# Patient Record
Sex: Female | Born: 1955 | Race: Black or African American | Hispanic: No | Smoking: Current every day smoker
Health system: Southern US, Community
[De-identification: ages and names within clinical notes are randomized; demographics above are authoritative.]

## PROBLEM LIST (undated history)

## (undated) DIAGNOSIS — G4700 Insomnia, unspecified: Secondary | ICD-10-CM

## (undated) DIAGNOSIS — I739 Peripheral vascular disease, unspecified: Secondary | ICD-10-CM

## (undated) DIAGNOSIS — D649 Anemia, unspecified: Secondary | ICD-10-CM

## (undated) DIAGNOSIS — B20 Human immunodeficiency virus [HIV] disease: Secondary | ICD-10-CM

## (undated) DIAGNOSIS — R350 Frequency of micturition: Secondary | ICD-10-CM

## (undated) DIAGNOSIS — G459 Transient cerebral ischemic attack, unspecified: Secondary | ICD-10-CM

## (undated) DIAGNOSIS — K219 Gastro-esophageal reflux disease without esophagitis: Secondary | ICD-10-CM

## (undated) DIAGNOSIS — R29898 Other symptoms and signs involving the musculoskeletal system: Secondary | ICD-10-CM

## (undated) DIAGNOSIS — B1920 Unspecified viral hepatitis C without hepatic coma: Secondary | ICD-10-CM

## (undated) DIAGNOSIS — E119 Type 2 diabetes mellitus without complications: Secondary | ICD-10-CM

## (undated) DIAGNOSIS — G629 Polyneuropathy, unspecified: Secondary | ICD-10-CM

## (undated) DIAGNOSIS — K861 Other chronic pancreatitis: Secondary | ICD-10-CM

## (undated) DIAGNOSIS — F1910 Other psychoactive substance abuse, uncomplicated: Secondary | ICD-10-CM

## (undated) DIAGNOSIS — R110 Nausea: Secondary | ICD-10-CM

## (undated) DIAGNOSIS — F39 Unspecified mood [affective] disorder: Secondary | ICD-10-CM

## (undated) DIAGNOSIS — I428 Other cardiomyopathies: Secondary | ICD-10-CM

## (undated) DIAGNOSIS — M549 Dorsalgia, unspecified: Secondary | ICD-10-CM

## (undated) DIAGNOSIS — E7800 Pure hypercholesterolemia, unspecified: Secondary | ICD-10-CM

## (undated) DIAGNOSIS — J45909 Unspecified asthma, uncomplicated: Secondary | ICD-10-CM

## (undated) DIAGNOSIS — I499 Cardiac arrhythmia, unspecified: Secondary | ICD-10-CM

## (undated) DIAGNOSIS — R351 Nocturia: Secondary | ICD-10-CM

## (undated) DIAGNOSIS — G8929 Other chronic pain: Secondary | ICD-10-CM

## (undated) DIAGNOSIS — J302 Other seasonal allergic rhinitis: Secondary | ICD-10-CM

## (undated) DIAGNOSIS — M1990 Unspecified osteoarthritis, unspecified site: Secondary | ICD-10-CM

## (undated) DIAGNOSIS — F32A Depression, unspecified: Secondary | ICD-10-CM

## (undated) DIAGNOSIS — K5900 Constipation, unspecified: Secondary | ICD-10-CM

## (undated) DIAGNOSIS — F329 Major depressive disorder, single episode, unspecified: Secondary | ICD-10-CM

## (undated) HISTORY — PX: LOWER EXTREMITY ANGIOGRAM: SHX5955

## (undated) HISTORY — PX: TUBAL LIGATION: SHX77

## (undated) HISTORY — DX: Major depressive disorder, single episode, unspecified: F32.9

## (undated) HISTORY — DX: Depression, unspecified: F32.A

## (undated) HISTORY — PX: CAROTID ENDARTERECTOMY: SUR193

## (undated) HISTORY — DX: Other chronic pain: G89.29

## (undated) HISTORY — DX: Other cardiomyopathies: I42.8

## (undated) HISTORY — DX: Polyneuropathy, unspecified: G62.9

## (undated) HISTORY — PX: TOE AMPUTATION: SHX809

---

## 1989-08-07 DIAGNOSIS — Z21 Asymptomatic human immunodeficiency virus [HIV] infection status: Secondary | ICD-10-CM

## 1989-08-07 DIAGNOSIS — B20 Human immunodeficiency virus [HIV] disease: Secondary | ICD-10-CM

## 1989-08-07 HISTORY — DX: Human immunodeficiency virus (HIV) disease: B20

## 1989-08-07 HISTORY — DX: Asymptomatic human immunodeficiency virus (hiv) infection status: Z21

## 2002-11-13 ENCOUNTER — Encounter: Payer: Self-pay | Admitting: Emergency Medicine

## 2002-11-13 ENCOUNTER — Emergency Department (HOSPITAL_COMMUNITY): Admission: EM | Admit: 2002-11-13 | Discharge: 2002-11-14 | Payer: Self-pay | Admitting: Emergency Medicine

## 2002-11-14 ENCOUNTER — Encounter: Payer: Self-pay | Admitting: Emergency Medicine

## 2004-10-01 ENCOUNTER — Ambulatory Visit: Payer: Self-pay | Admitting: Nurse Practitioner

## 2004-10-02 ENCOUNTER — Ambulatory Visit: Payer: Self-pay | Admitting: Nurse Practitioner

## 2004-10-09 ENCOUNTER — Ambulatory Visit: Payer: Self-pay | Admitting: Family Medicine

## 2004-10-17 ENCOUNTER — Ambulatory Visit: Payer: Self-pay | Admitting: Family Medicine

## 2005-07-07 ENCOUNTER — Ambulatory Visit: Payer: Self-pay | Admitting: Nurse Practitioner

## 2005-08-18 ENCOUNTER — Inpatient Hospital Stay (HOSPITAL_COMMUNITY): Admission: EM | Admit: 2005-08-18 | Discharge: 2005-08-26 | Payer: Self-pay | Admitting: Emergency Medicine

## 2005-08-27 ENCOUNTER — Ambulatory Visit: Payer: Self-pay | Admitting: Nurse Practitioner

## 2005-08-27 ENCOUNTER — Emergency Department (HOSPITAL_COMMUNITY): Admission: EM | Admit: 2005-08-27 | Discharge: 2005-08-27 | Payer: Self-pay | Admitting: Emergency Medicine

## 2005-09-01 ENCOUNTER — Ambulatory Visit: Payer: Self-pay | Admitting: Nurse Practitioner

## 2005-11-02 ENCOUNTER — Ambulatory Visit: Payer: Self-pay | Admitting: Family Medicine

## 2005-11-16 ENCOUNTER — Ambulatory Visit: Payer: Self-pay | Admitting: Nurse Practitioner

## 2005-11-19 ENCOUNTER — Ambulatory Visit: Payer: Self-pay | Admitting: Nurse Practitioner

## 2006-01-06 ENCOUNTER — Ambulatory Visit: Payer: Self-pay | Admitting: Family Medicine

## 2006-01-06 ENCOUNTER — Inpatient Hospital Stay (HOSPITAL_COMMUNITY): Admission: EM | Admit: 2006-01-06 | Discharge: 2006-01-14 | Payer: Self-pay | Admitting: Emergency Medicine

## 2006-01-06 ENCOUNTER — Ambulatory Visit: Payer: Self-pay | Admitting: Cardiology

## 2006-01-07 ENCOUNTER — Encounter (INDEPENDENT_AMBULATORY_CARE_PROVIDER_SITE_OTHER): Payer: Self-pay | Admitting: Cardiology

## 2006-01-13 ENCOUNTER — Encounter (INDEPENDENT_AMBULATORY_CARE_PROVIDER_SITE_OTHER): Payer: Self-pay | Admitting: *Deleted

## 2006-03-15 ENCOUNTER — Ambulatory Visit: Payer: Self-pay | Admitting: Nurse Practitioner

## 2006-03-30 ENCOUNTER — Ambulatory Visit: Payer: Self-pay | Admitting: Nurse Practitioner

## 2006-03-30 ENCOUNTER — Encounter (INDEPENDENT_AMBULATORY_CARE_PROVIDER_SITE_OTHER): Payer: Self-pay | Admitting: *Deleted

## 2006-03-30 ENCOUNTER — Other Ambulatory Visit: Admission: RE | Admit: 2006-03-30 | Discharge: 2006-03-30 | Payer: Self-pay | Admitting: Family Medicine

## 2006-05-04 ENCOUNTER — Ambulatory Visit: Payer: Self-pay | Admitting: Nurse Practitioner

## 2006-05-05 ENCOUNTER — Ambulatory Visit (HOSPITAL_COMMUNITY): Admission: RE | Admit: 2006-05-05 | Discharge: 2006-05-05 | Payer: Self-pay | Admitting: Internal Medicine

## 2006-05-21 ENCOUNTER — Ambulatory Visit: Payer: Self-pay | Admitting: Nurse Practitioner

## 2006-06-15 ENCOUNTER — Ambulatory Visit: Payer: Self-pay | Admitting: Family Medicine

## 2006-06-15 ENCOUNTER — Emergency Department (HOSPITAL_COMMUNITY): Admission: EM | Admit: 2006-06-15 | Discharge: 2006-06-16 | Payer: Self-pay | Admitting: Emergency Medicine

## 2006-06-23 ENCOUNTER — Ambulatory Visit: Payer: Self-pay | Admitting: Nurse Practitioner

## 2006-08-26 ENCOUNTER — Ambulatory Visit: Payer: Self-pay | Admitting: Nurse Practitioner

## 2006-10-17 ENCOUNTER — Inpatient Hospital Stay (HOSPITAL_COMMUNITY): Admission: EM | Admit: 2006-10-17 | Discharge: 2006-10-23 | Payer: Self-pay | Admitting: Emergency Medicine

## 2006-11-15 ENCOUNTER — Inpatient Hospital Stay (HOSPITAL_COMMUNITY): Admission: EM | Admit: 2006-11-15 | Discharge: 2006-11-18 | Payer: Self-pay | Admitting: Emergency Medicine

## 2006-12-09 ENCOUNTER — Ambulatory Visit: Payer: Self-pay | Admitting: Nurse Practitioner

## 2007-01-03 ENCOUNTER — Inpatient Hospital Stay (HOSPITAL_COMMUNITY): Admission: EM | Admit: 2007-01-03 | Discharge: 2007-01-07 | Payer: Self-pay | Admitting: Emergency Medicine

## 2007-01-05 ENCOUNTER — Ambulatory Visit: Payer: Self-pay | Admitting: Infectious Diseases

## 2007-03-02 ENCOUNTER — Ambulatory Visit: Payer: Self-pay | Admitting: Nurse Practitioner

## 2007-03-02 ENCOUNTER — Inpatient Hospital Stay (HOSPITAL_COMMUNITY): Admission: EM | Admit: 2007-03-02 | Discharge: 2007-03-05 | Payer: Self-pay | Admitting: *Deleted

## 2007-03-09 ENCOUNTER — Ambulatory Visit: Payer: Self-pay | Admitting: Nurse Practitioner

## 2007-03-10 ENCOUNTER — Ambulatory Visit: Payer: Self-pay | Admitting: Internal Medicine

## 2007-03-28 ENCOUNTER — Ambulatory Visit: Payer: Self-pay | Admitting: Internal Medicine

## 2007-04-14 ENCOUNTER — Ambulatory Visit: Payer: Self-pay | Admitting: Internal Medicine

## 2007-04-15 ENCOUNTER — Ambulatory Visit: Payer: Self-pay | Admitting: Internal Medicine

## 2007-04-20 ENCOUNTER — Ambulatory Visit: Payer: Self-pay | Admitting: Internal Medicine

## 2007-05-05 ENCOUNTER — Ambulatory Visit: Payer: Self-pay | Admitting: Internal Medicine

## 2007-05-09 ENCOUNTER — Ambulatory Visit: Payer: Self-pay | Admitting: Internal Medicine

## 2007-05-10 ENCOUNTER — Ambulatory Visit: Payer: Self-pay | Admitting: Family Medicine

## 2007-05-11 ENCOUNTER — Ambulatory Visit: Payer: Self-pay | Admitting: Internal Medicine

## 2007-05-12 ENCOUNTER — Ambulatory Visit: Payer: Self-pay | Admitting: Internal Medicine

## 2007-05-13 ENCOUNTER — Ambulatory Visit: Payer: Self-pay | Admitting: Internal Medicine

## 2007-05-17 ENCOUNTER — Ambulatory Visit: Payer: Self-pay | Admitting: Internal Medicine

## 2007-05-20 ENCOUNTER — Ambulatory Visit: Payer: Self-pay | Admitting: Internal Medicine

## 2007-05-27 ENCOUNTER — Ambulatory Visit: Payer: Self-pay | Admitting: Internal Medicine

## 2007-06-03 ENCOUNTER — Ambulatory Visit: Payer: Self-pay | Admitting: Family Medicine

## 2007-06-21 ENCOUNTER — Encounter: Payer: Self-pay | Admitting: *Deleted

## 2007-06-22 ENCOUNTER — Ambulatory Visit: Payer: Self-pay | Admitting: Psychiatry

## 2007-06-22 ENCOUNTER — Inpatient Hospital Stay (HOSPITAL_COMMUNITY): Admission: RE | Admit: 2007-06-22 | Discharge: 2007-07-06 | Payer: Self-pay | Admitting: Psychiatry

## 2007-06-23 ENCOUNTER — Other Ambulatory Visit: Payer: Self-pay | Admitting: Emergency Medicine

## 2007-06-28 ENCOUNTER — Ambulatory Visit: Payer: Self-pay | Admitting: Infectious Diseases

## 2007-10-25 ENCOUNTER — Emergency Department (HOSPITAL_COMMUNITY): Admission: EM | Admit: 2007-10-25 | Discharge: 2007-10-25 | Payer: Self-pay | Admitting: Emergency Medicine

## 2007-10-25 ENCOUNTER — Encounter: Admission: RE | Admit: 2007-10-25 | Discharge: 2007-10-25 | Payer: Self-pay | Admitting: Internal Medicine

## 2007-10-25 ENCOUNTER — Ambulatory Visit: Payer: Self-pay | Admitting: Internal Medicine

## 2007-10-25 DIAGNOSIS — B20 Human immunodeficiency virus [HIV] disease: Secondary | ICD-10-CM

## 2007-10-25 LAB — CONVERTED CEMR LAB
AST: 88 units/L — ABNORMAL HIGH (ref 0–37)
Albumin: 3.8 g/dL (ref 3.5–5.2)
BUN: 15 mg/dL (ref 6–23)
Basophils Relative: 1 % (ref 0–1)
Calcium: 9.5 mg/dL (ref 8.4–10.5)
Chlamydia, Swab/Urine, PCR: NEGATIVE
Chloride: 105 meq/L (ref 96–112)
Cholesterol: 153 mg/dL (ref 0–200)
Creatinine, Ser: 0.62 mg/dL (ref 0.40–1.20)
Glucose, Bld: 61 mg/dL — ABNORMAL LOW (ref 70–99)
HCV Ab: POSITIVE — AB
HDL: 55 mg/dL (ref >39)
HIV 1 RNA Quant: 8610 copies/mL — ABNORMAL HIGH (ref <50)
HIV-1 RNA Quant, Log: 3.94 — ABNORMAL HIGH (ref <1.70)
Hemoglobin, Urine: NEGATIVE
Hemoglobin: 12.6 g/dL (ref 12.0–15.0)
Hep A Total Ab: POSITIVE — AB
Hepatitis B Surface Ag: NEGATIVE
Ketones, ur: NEGATIVE mg/dL
Lymphs Abs: 1.2 10*3/uL (ref 0.7–4.0)
MCHC: 30.9 g/dL (ref 30.0–36.0)
MCV: 92.7 fL (ref 78.0–100.0)
Monocytes Absolute: 0.4 10*3/uL (ref 0.1–1.0)
Monocytes Relative: 12 % (ref 3–12)
Neutro Abs: 1.6 10*3/uL — ABNORMAL LOW (ref 1.7–7.7)
Nitrite: NEGATIVE
Potassium: 4 meq/L (ref 3.5–5.3)
Protein, ur: NEGATIVE mg/dL
RBC: 4.4 M/uL (ref 3.87–5.11)
RPR Ser Ql: REACTIVE — AB
RPR Titer: 1:16 {titer} — AB
T Pallidum Abs: REACTIVE — AB
Total CHOL/HDL Ratio: 2.8
Triglycerides: 61 mg/dL (ref <150)
WBC: 3.3 10*3/uL — ABNORMAL LOW (ref 4.0–10.5)
pH: 5.5 (ref 5.0–8.0)

## 2007-11-18 ENCOUNTER — Ambulatory Visit: Payer: Self-pay | Admitting: Internal Medicine

## 2007-11-18 ENCOUNTER — Encounter: Payer: Self-pay | Admitting: Internal Medicine

## 2007-11-18 ENCOUNTER — Emergency Department (HOSPITAL_COMMUNITY): Admission: EM | Admit: 2007-11-18 | Discharge: 2007-11-18 | Payer: Self-pay | Admitting: *Deleted

## 2007-11-18 DIAGNOSIS — B1710 Acute hepatitis C without hepatic coma: Secondary | ICD-10-CM

## 2007-11-18 DIAGNOSIS — E1051 Type 1 diabetes mellitus with diabetic peripheral angiopathy without gangrene: Secondary | ICD-10-CM

## 2007-11-18 DIAGNOSIS — F329 Major depressive disorder, single episode, unspecified: Secondary | ICD-10-CM

## 2007-11-18 DIAGNOSIS — I70209 Unspecified atherosclerosis of native arteries of extremities, unspecified extremity: Secondary | ICD-10-CM

## 2007-11-18 DIAGNOSIS — Z9189 Other specified personal risk factors, not elsewhere classified: Secondary | ICD-10-CM | POA: Insufficient documentation

## 2007-11-18 DIAGNOSIS — F3289 Other specified depressive episodes: Secondary | ICD-10-CM | POA: Insufficient documentation

## 2007-11-18 DIAGNOSIS — F1021 Alcohol dependence, in remission: Secondary | ICD-10-CM

## 2007-11-18 LAB — CONVERTED CEMR LAB
Pap Smear: NORMAL
Pap Smear: NORMAL

## 2007-11-24 ENCOUNTER — Ambulatory Visit (HOSPITAL_COMMUNITY): Admission: RE | Admit: 2007-11-24 | Discharge: 2007-11-24 | Payer: Self-pay | Admitting: Internal Medicine

## 2007-12-05 ENCOUNTER — Encounter: Payer: Self-pay | Admitting: Internal Medicine

## 2007-12-12 ENCOUNTER — Encounter: Payer: Self-pay | Admitting: Internal Medicine

## 2008-03-07 ENCOUNTER — Encounter: Admission: RE | Admit: 2008-03-07 | Discharge: 2008-03-07 | Payer: Self-pay | Admitting: Internal Medicine

## 2008-03-07 ENCOUNTER — Ambulatory Visit: Payer: Self-pay | Admitting: Internal Medicine

## 2008-03-07 LAB — CONVERTED CEMR LAB
HIV 1 RNA Quant: 6070 copies/mL — ABNORMAL HIGH (ref <50)
HIV-1 RNA Quant, Log: 3.78 — ABNORMAL HIGH (ref <1.70)

## 2008-03-09 LAB — CONVERTED CEMR LAB
ALT: 86 units/L — ABNORMAL HIGH (ref 0–35)
Basophils Absolute: 0 10*3/uL (ref 0.0–0.1)
CO2: 24 meq/L (ref 19–32)
Calcium: 9.1 mg/dL (ref 8.4–10.5)
Chloride: 101 meq/L (ref 96–112)
Cholesterol: 138 mg/dL (ref 0–200)
Glucose, Bld: 250 mg/dL — ABNORMAL HIGH (ref 70–99)
HCT: 38.3 % (ref 36.0–46.0)
Hemoglobin: 12.3 g/dL (ref 12.0–15.0)
Lymphocytes Relative: 36 % (ref 12–46)
Lymphs Abs: 1.2 10*3/uL (ref 0.7–4.0)
Monocytes Absolute: 0.4 10*3/uL (ref 0.1–1.0)
Neutro Abs: 1.6 10*3/uL — ABNORMAL LOW (ref 1.7–7.7)
Platelets: 238 10*3/uL (ref 150–400)
RDW: 13 % (ref 11.5–15.5)
RPR Titer: 1:2 {titer}
Sodium: 136 meq/L (ref 135–145)
Total Protein: 7.9 g/dL (ref 6.0–8.3)
Triglycerides: 75 mg/dL (ref <150)
WBC: 3.3 10*3/uL — ABNORMAL LOW (ref 4.0–10.5)

## 2008-03-21 ENCOUNTER — Ambulatory Visit: Payer: Self-pay | Admitting: Internal Medicine

## 2008-03-21 LAB — CONVERTED CEMR LAB: Hgb A1c MFr Bld: 7 %

## 2008-04-10 ENCOUNTER — Telehealth: Payer: Self-pay | Admitting: Internal Medicine

## 2008-04-10 ENCOUNTER — Encounter: Payer: Self-pay | Admitting: Internal Medicine

## 2008-04-17 ENCOUNTER — Ambulatory Visit: Payer: Self-pay | Admitting: Vascular Surgery

## 2008-04-24 ENCOUNTER — Ambulatory Visit: Payer: Self-pay | Admitting: Vascular Surgery

## 2008-05-22 ENCOUNTER — Ambulatory Visit: Payer: Self-pay | Admitting: Surgery

## 2008-05-22 ENCOUNTER — Ambulatory Visit (HOSPITAL_COMMUNITY): Admission: RE | Admit: 2008-05-22 | Discharge: 2008-05-22 | Payer: Self-pay | Admitting: Surgery

## 2008-06-26 ENCOUNTER — Ambulatory Visit: Payer: Self-pay | Admitting: Vascular Surgery

## 2008-06-28 ENCOUNTER — Encounter: Admission: RE | Admit: 2008-06-28 | Discharge: 2008-06-28 | Payer: Self-pay | Admitting: Internal Medicine

## 2008-06-28 ENCOUNTER — Ambulatory Visit: Payer: Self-pay | Admitting: Internal Medicine

## 2008-06-28 LAB — CONVERTED CEMR LAB
ALT: 86 units/L — ABNORMAL HIGH (ref 0–35)
Basophils Absolute: 0 10*3/uL (ref 0.0–0.1)
CO2: 24 meq/L (ref 19–32)
Chloride: 100 meq/L (ref 96–112)
Eosinophils Relative: 3 % (ref 0–5)
HIV 1 RNA Quant: 19400 copies/mL — ABNORMAL HIGH (ref <50)
Lymphocytes Relative: 31 % (ref 12–46)
Neutro Abs: 2.2 10*3/uL (ref 1.7–7.7)
Neutrophils Relative %: 53 % (ref 43–77)
Platelets: 274 10*3/uL (ref 150–400)
Potassium: 4.1 meq/L (ref 3.5–5.3)
RDW: 13.7 % (ref 11.5–15.5)
Sodium: 133 meq/L — ABNORMAL LOW (ref 135–145)
Total Bilirubin: 0.3 mg/dL (ref 0.3–1.2)
Total Protein: 8.8 g/dL — ABNORMAL HIGH (ref 6.0–8.3)

## 2008-07-06 ENCOUNTER — Telehealth (INDEPENDENT_AMBULATORY_CARE_PROVIDER_SITE_OTHER): Payer: Self-pay | Admitting: *Deleted

## 2008-07-06 ENCOUNTER — Ambulatory Visit: Payer: Self-pay | Admitting: Internal Medicine

## 2008-07-06 DIAGNOSIS — L299 Pruritus, unspecified: Secondary | ICD-10-CM | POA: Insufficient documentation

## 2008-07-21 IMAGING — CT CT ABDOMEN W/ CM
2 of 5 series · 13 of 32 positions shown, 18 images · IV contrast (OMNI 300/WATER & 80 ML OMNI 300)
Comparison: None.

CLINICAL DATA: Abdominal pain.  
 ABDOMEN CT WITH CONTRAST:
TECHNIQUE: Multidetector CT imaging of the abdomen was performed following the standard protocol during bolus administration of intravenous contrast.
 Contrast:  80 cc Omnipaque 300 and oral contrast.
TECHNIQUE: Multidetector CT imaging of the pelvis was performed following the standard protocol during bolus administration of intravenous contrast.

[Series 2: routine abdomen · axial · 0.67mm/px · z∈[-340,-80]mm · 5 of 78 slices shown, 10 images]
[im 13/78  soft-tissue]
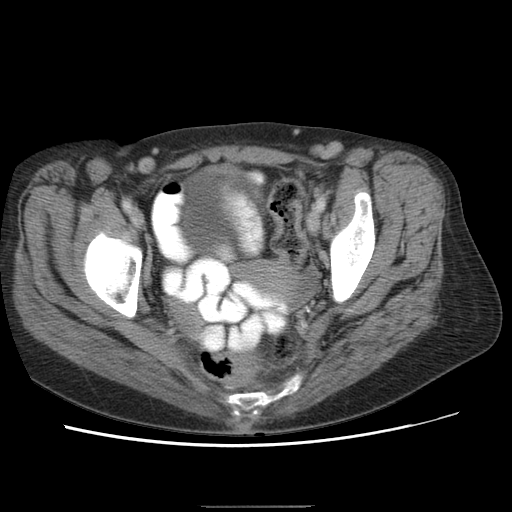
[im 13/78  bone]
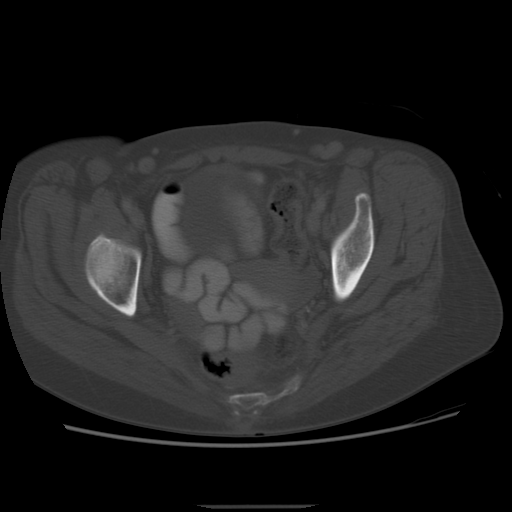
[im 26/78  soft-tissue]
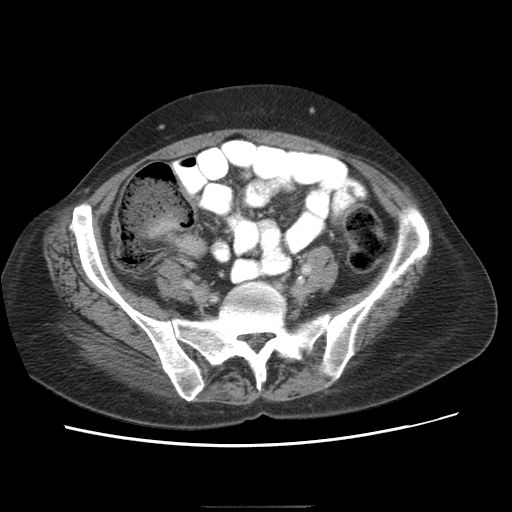
[im 26/78  lung]
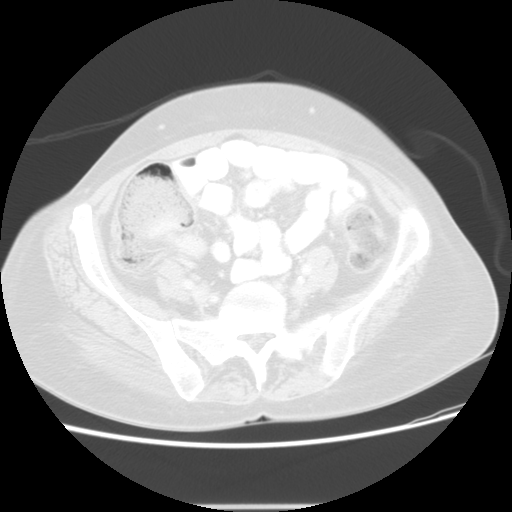
[im 39/78  soft-tissue]
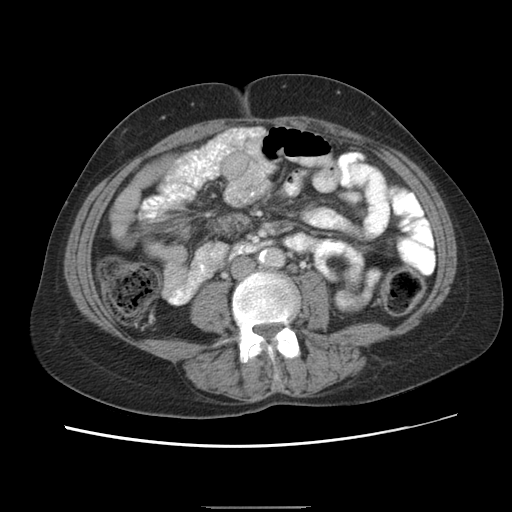
[im 39/78  lung]
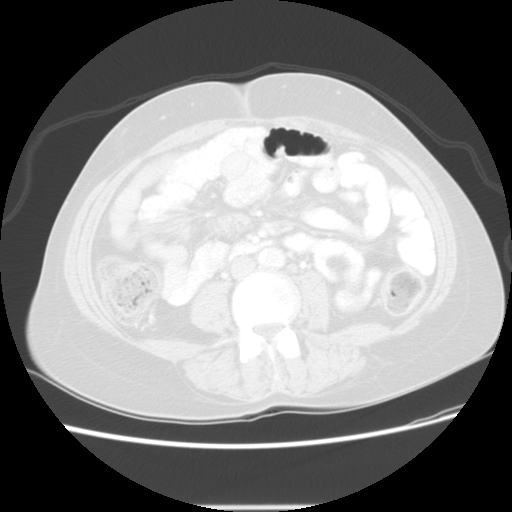
[im 52/78  soft-tissue]
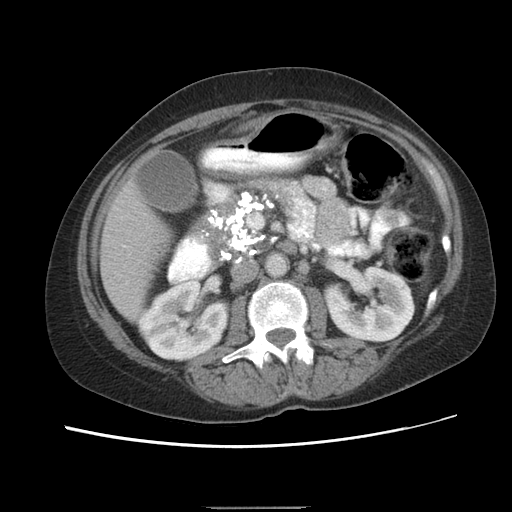
[im 52/78  lung]
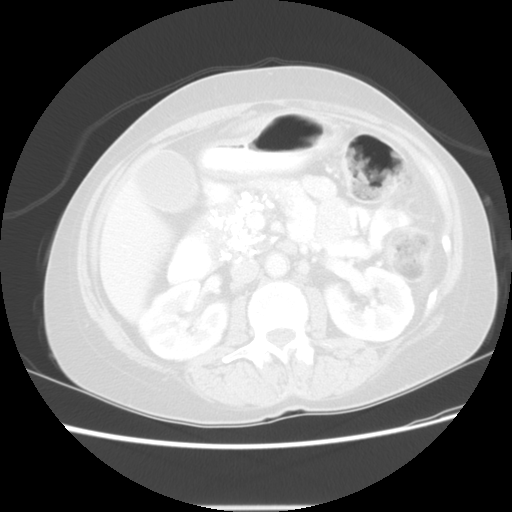
[im 65/78  soft-tissue]
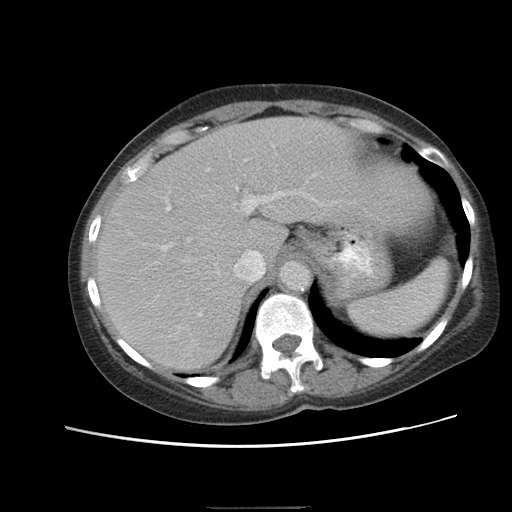
[im 65/78  lung]
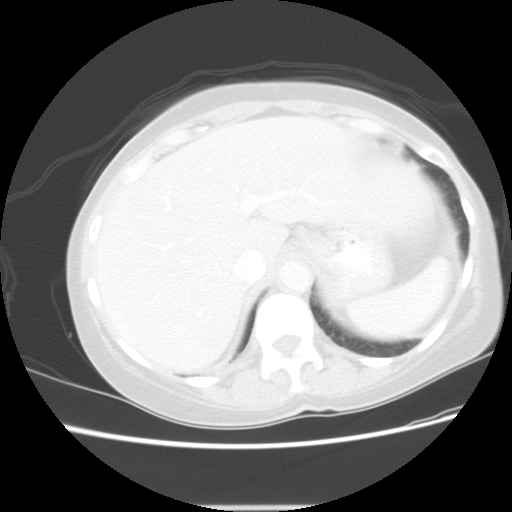

[Series 401: sagittal · sagittal · 0.74mm/px · 8 of 137 slices shown]
[im 13/137  soft-tissue]
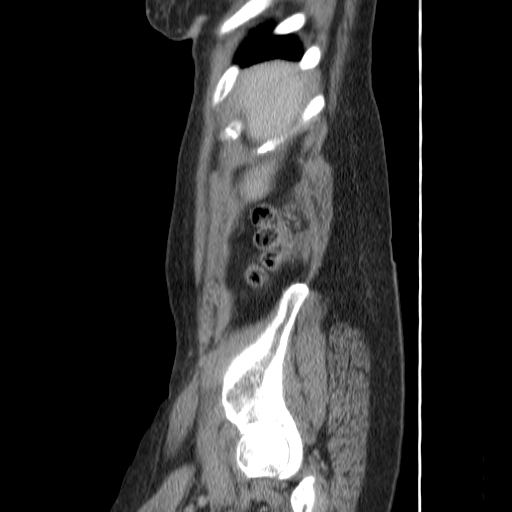
[im 25/137  soft-tissue]
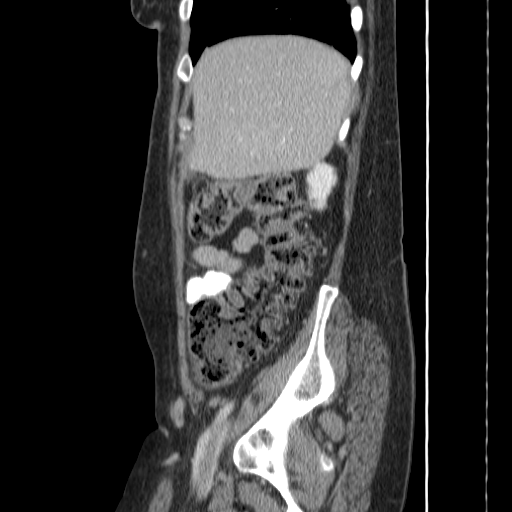
[im 50/137  soft-tissue]
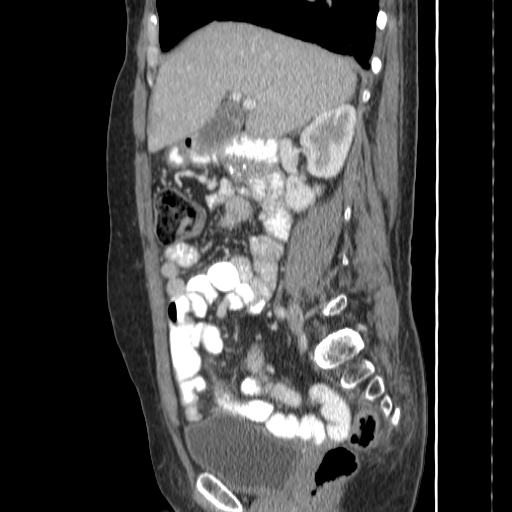
[im 62/137  soft-tissue]
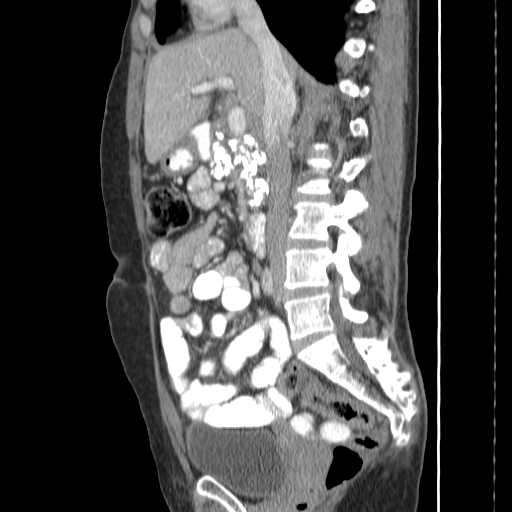
[im 75/137  soft-tissue]
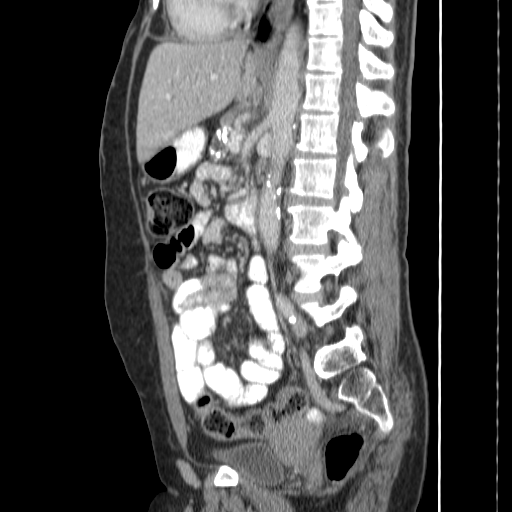
[im 87/137  soft-tissue]
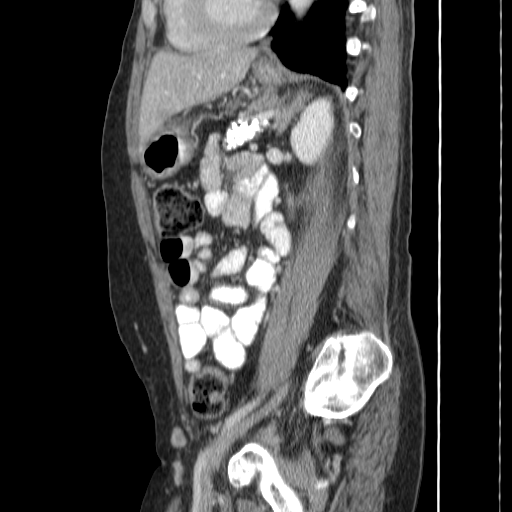
[im 112/137  soft-tissue]
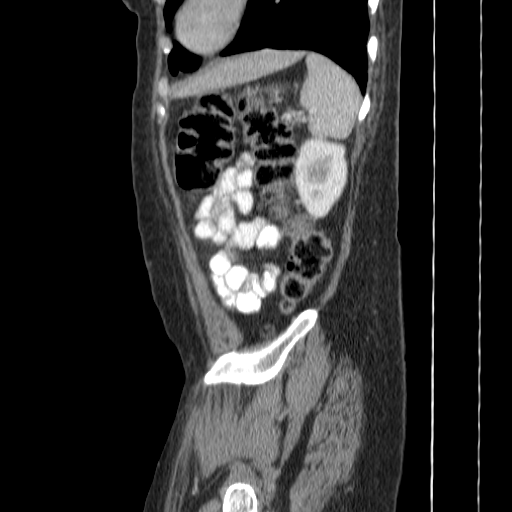
[im 124/137  soft-tissue]
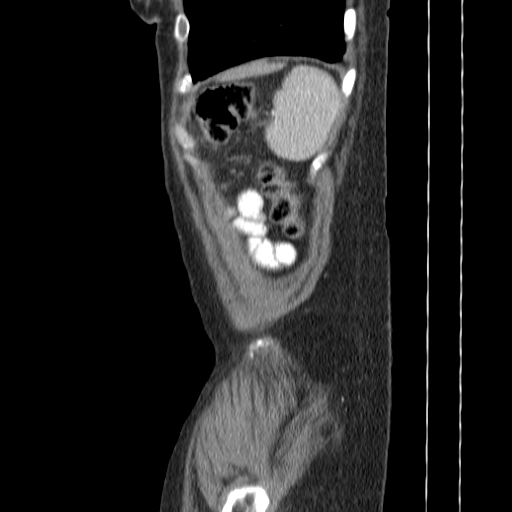

[13 of 32 positions shown; findings below may reference images not displayed]

FINDINGS: There is scarring identified at both lung bases.  
 The liver is normal in attenuation and morphology.  
 The spleen is normal.  
 Both adrenal glands are normal.  
 Calcified pancreatic parenchyma consistent with chronic pancreatitis.  
 The adrenal glands are both normal.  
 The right and the left kidney are both normal.  
 Spleen normal.  
 The gallbladder is normal by CT.  
 No pathologically enlarged retroperitoneal or small bowel mesenteric lymph nodes are identified.  
 The bowel loops of the upper abdomen are non-obstructed.   No abnormal bowel wall thickening or pericolonic inflammatory changes noted.  
 A moderate fecal burden is identified within the colon.  Correlate for any signs or symptoms of constipation.  
 The appendix has a normal appearance.  
 Review of the bone windows shows no lytic or sclerotic lesions.
IMPRESSION: 1.  Calcification of the pancreatic parenchyma consistent with chronic pancreatitis.  
 2.  Bilateral lower lobe pulmonary scarring.  
 3.  Moderate fecal burden.  Correlate for any signs and symptoms of constipation.  
 PELVIS CT WITH CONTRAST:
FINDINGS: The urinary bladder is normal.  The uterus and adnexal structures are normal for patient?s age.  
 There is no free pelvic fluid.  
 The pelvic bowel loops have a normal appearance.  
 There are no enlarged pelvic or inguinal lymph nodes.  
 No free fluid or abnormal fluid collections.  No masses noted.  
 Review of bone windows show no lytic or sclerotic lesions.
IMPRESSION: No acute pelvic CT findings.

## 2008-07-23 ENCOUNTER — Emergency Department (HOSPITAL_COMMUNITY): Admission: EM | Admit: 2008-07-23 | Discharge: 2008-07-23 | Payer: Self-pay | Admitting: Emergency Medicine

## 2008-07-27 ENCOUNTER — Encounter: Payer: Self-pay | Admitting: Internal Medicine

## 2008-08-16 ENCOUNTER — Emergency Department (HOSPITAL_COMMUNITY): Admission: EM | Admit: 2008-08-16 | Discharge: 2008-08-16 | Payer: Self-pay | Admitting: Emergency Medicine

## 2008-08-28 ENCOUNTER — Encounter: Payer: Self-pay | Admitting: Internal Medicine

## 2008-09-05 ENCOUNTER — Ambulatory Visit: Payer: Self-pay | Admitting: Internal Medicine

## 2008-09-05 LAB — CONVERTED CEMR LAB
AST: 75 units/L — ABNORMAL HIGH (ref 0–37)
Alkaline Phosphatase: 96 units/L (ref 39–117)
BUN: 17 mg/dL (ref 6–23)
Basophils Relative: 1 % (ref 0–1)
Creatinine, Ser: 0.86 mg/dL (ref 0.40–1.20)
Eosinophils Absolute: 0.1 10*3/uL (ref 0.0–0.7)
HIV 1 RNA Quant: 119 copies/mL — ABNORMAL HIGH (ref <50)
HIV-1 RNA Quant, Log: 2.08 — ABNORMAL HIGH (ref <1.70)
Hemoglobin: 11.2 g/dL — ABNORMAL LOW (ref 12.0–15.0)
MCHC: 31.9 g/dL (ref 30.0–36.0)
MCV: 88.4 fL (ref 78.0–100.0)
Monocytes Absolute: 0.4 10*3/uL (ref 0.1–1.0)
Monocytes Relative: 9 % (ref 3–12)
Neutrophils Relative %: 64 % (ref 43–77)
RBC: 3.97 M/uL (ref 3.87–5.11)

## 2008-09-19 ENCOUNTER — Ambulatory Visit: Payer: Self-pay | Admitting: Internal Medicine

## 2008-09-19 DIAGNOSIS — I7389 Other specified peripheral vascular diseases: Secondary | ICD-10-CM

## 2008-12-03 ENCOUNTER — Encounter: Payer: Self-pay | Admitting: Internal Medicine

## 2008-12-03 ENCOUNTER — Ambulatory Visit: Payer: Self-pay | Admitting: Internal Medicine

## 2008-12-10 ENCOUNTER — Encounter: Payer: Self-pay | Admitting: Internal Medicine

## 2008-12-18 ENCOUNTER — Ambulatory Visit: Payer: Self-pay | Admitting: Internal Medicine

## 2008-12-18 LAB — CONVERTED CEMR LAB
AST: 52 units/L — ABNORMAL HIGH (ref 0–37)
Albumin: 3.7 g/dL (ref 3.5–5.2)
BUN: 11 mg/dL (ref 6–23)
Basophils Relative: 0 % (ref 0–1)
Calcium: 9 mg/dL (ref 8.4–10.5)
Chloride: 102 meq/L (ref 96–112)
Glucose, Bld: 167 mg/dL — ABNORMAL HIGH (ref 70–99)
Lymphs Abs: 1.6 10*3/uL (ref 0.7–4.0)
Monocytes Relative: 8 % (ref 3–12)
Neutro Abs: 3 10*3/uL (ref 1.7–7.7)
Neutrophils Relative %: 59 % (ref 43–77)
Potassium: 4.3 meq/L (ref 3.5–5.3)
RBC: 4.27 M/uL (ref 3.87–5.11)
Total Protein: 7.8 g/dL (ref 6.0–8.3)
WBC: 5 10*3/uL (ref 4.0–10.5)

## 2008-12-25 ENCOUNTER — Emergency Department (HOSPITAL_COMMUNITY): Admission: EM | Admit: 2008-12-25 | Discharge: 2008-12-25 | Payer: Self-pay | Admitting: Emergency Medicine

## 2008-12-25 ENCOUNTER — Encounter: Payer: Self-pay | Admitting: Internal Medicine

## 2009-01-04 ENCOUNTER — Ambulatory Visit: Payer: Self-pay | Admitting: Internal Medicine

## 2009-01-16 ENCOUNTER — Ambulatory Visit (HOSPITAL_COMMUNITY): Admission: RE | Admit: 2009-01-16 | Discharge: 2009-01-16 | Payer: Self-pay | Admitting: Obstetrics & Gynecology

## 2009-01-19 ENCOUNTER — Ambulatory Visit: Payer: Self-pay

## 2009-01-22 ENCOUNTER — Encounter: Admission: RE | Admit: 2009-01-22 | Discharge: 2009-01-22 | Payer: Self-pay | Admitting: Obstetrics & Gynecology

## 2009-04-03 ENCOUNTER — Ambulatory Visit: Payer: Self-pay | Admitting: Internal Medicine

## 2009-04-03 LAB — CONVERTED CEMR LAB
Alkaline Phosphatase: 94 units/L (ref 39–117)
BUN: 15 mg/dL (ref 6–23)
Creatinine, Ser: 0.99 mg/dL (ref 0.40–1.20)
Eosinophils Relative: 2 % (ref 0–5)
GFR calc non Af Amer: 59 mL/min — ABNORMAL LOW (ref >60)
Glucose, Bld: 161 mg/dL — ABNORMAL HIGH (ref 70–99)
HCT: 37.2 % (ref 36.0–46.0)
HIV 1 RNA Quant: <48 copies/mL (ref <48)
HIV-1 RNA Quant, Log: <1.68 (ref <1.68)
Hemoglobin: 12.1 g/dL (ref 12.0–15.0)
Lymphocytes Relative: 32 % (ref 12–46)
Lymphs Abs: 1.4 10*3/uL (ref 0.7–4.0)
Monocytes Absolute: 0.6 10*3/uL (ref 0.1–1.0)
Monocytes Relative: 13 % — ABNORMAL HIGH (ref 3–12)
Neutro Abs: 2.3 10*3/uL (ref 1.7–7.7)
RBC: 4.13 M/uL (ref 3.87–5.11)
Sodium: 139 meq/L (ref 135–145)
Total Bilirubin: 0.2 mg/dL — ABNORMAL LOW (ref 0.3–1.2)
Total Protein: 7.2 g/dL (ref 6.0–8.3)
WBC: 4.4 10*3/uL (ref 4.0–10.5)

## 2009-04-04 ENCOUNTER — Encounter: Payer: Self-pay | Admitting: Internal Medicine

## 2009-04-19 ENCOUNTER — Ambulatory Visit: Payer: Self-pay | Admitting: Internal Medicine

## 2009-04-19 DIAGNOSIS — K219 Gastro-esophageal reflux disease without esophagitis: Secondary | ICD-10-CM | POA: Insufficient documentation

## 2009-04-19 LAB — CONVERTED CEMR LAB: Hgb A1c MFr Bld: 6.4 %

## 2009-04-22 ENCOUNTER — Encounter: Payer: Self-pay | Admitting: Internal Medicine

## 2009-06-25 ENCOUNTER — Emergency Department (HOSPITAL_COMMUNITY): Admission: EM | Admit: 2009-06-25 | Discharge: 2009-06-25 | Payer: Self-pay | Admitting: Emergency Medicine

## 2009-08-09 ENCOUNTER — Encounter: Payer: Self-pay | Admitting: Internal Medicine

## 2009-08-14 ENCOUNTER — Ambulatory Visit: Payer: Self-pay | Admitting: Internal Medicine

## 2009-08-14 DIAGNOSIS — IMO0002 Reserved for concepts with insufficient information to code with codable children: Secondary | ICD-10-CM

## 2009-08-14 LAB — CONVERTED CEMR LAB: HIV-1 RNA Quant, Log: <1.68 (ref <1.68)

## 2009-08-15 LAB — CONVERTED CEMR LAB
ALT: 41 units/L — ABNORMAL HIGH (ref 0–35)
AST: 31 units/L (ref 0–37)
Albumin: 3.5 g/dL (ref 3.5–5.2)
Alkaline Phosphatase: 94 units/L (ref 39–117)
Basophils Absolute: 0.1 10*3/uL (ref 0.0–0.1)
Basophils Relative: 1 % (ref 0–1)
Calcium: 9.1 mg/dL (ref 8.4–10.5)
Chloride: 104 meq/L (ref 96–112)
Eosinophils Relative: 3 % (ref 0–5)
HCT: 35 % — ABNORMAL LOW (ref 36.0–46.0)
Hemoglobin: 11.2 g/dL — ABNORMAL LOW (ref 12.0–15.0)
LDL Cholesterol: 51 mg/dL (ref 0–99)
Lymphocytes Relative: 27 % (ref 12–46)
MCHC: 32 g/dL (ref 30.0–36.0)
Monocytes Absolute: 0.7 10*3/uL (ref 0.1–1.0)
Neutro Abs: 3.7 10*3/uL (ref 1.7–7.7)
Platelets: 258 10*3/uL (ref 150–400)
Potassium: 4.3 meq/L (ref 3.5–5.3)
RDW: 14.3 % (ref 11.5–15.5)
Triglycerides: 119 mg/dL (ref <150)
VLDL: 24 mg/dL (ref 0–40)

## 2009-08-30 ENCOUNTER — Telehealth (INDEPENDENT_AMBULATORY_CARE_PROVIDER_SITE_OTHER): Payer: Self-pay | Admitting: *Deleted

## 2009-09-03 ENCOUNTER — Ambulatory Visit (HOSPITAL_COMMUNITY): Admission: RE | Admit: 2009-09-03 | Discharge: 2009-09-03 | Payer: Self-pay | Admitting: Internal Medicine

## 2009-11-11 ENCOUNTER — Ambulatory Visit: Payer: Self-pay | Admitting: Internal Medicine

## 2009-11-11 LAB — CONVERTED CEMR LAB
HIV 1 RNA Quant: <48 copies/mL (ref <48)
HIV-1 RNA Quant, Log: <1.68 (ref <1.68)

## 2009-11-12 LAB — CONVERTED CEMR LAB
ALT: 33 units/L (ref 0–35)
AST: 28 units/L (ref 0–37)
Alkaline Phosphatase: 93 units/L (ref 39–117)
BUN: 23 mg/dL (ref 6–23)
Basophils Absolute: 0 10*3/uL (ref 0.0–0.1)
Calcium: 8.9 mg/dL (ref 8.4–10.5)
Creatinine, Ser: 1.17 mg/dL (ref 0.40–1.20)
Eosinophils Absolute: 0.2 10*3/uL (ref 0.0–0.7)
Eosinophils Relative: 4 % (ref 0–5)
HCT: 35.9 % — ABNORMAL LOW (ref 36.0–46.0)
Hemoglobin: 11.1 g/dL — ABNORMAL LOW (ref 12.0–15.0)
Lymphocytes Relative: 28 % (ref 12–46)
MCHC: 30.9 g/dL (ref 30.0–36.0)
MCV: 92.3 fL (ref >=78.0)
Monocytes Absolute: 0.5 10*3/uL (ref 0.1–1.0)
Platelets: 260 10*3/uL (ref 150–400)
RDW: 14.9 % (ref 11.5–15.5)
RPR Titer: 1:1 {titer}
Total Bilirubin: 0.2 mg/dL — ABNORMAL LOW (ref 0.3–1.2)

## 2009-11-27 ENCOUNTER — Ambulatory Visit: Payer: Self-pay | Admitting: Internal Medicine

## 2009-12-04 ENCOUNTER — Ambulatory Visit: Payer: Self-pay | Admitting: Internal Medicine

## 2009-12-04 ENCOUNTER — Emergency Department (HOSPITAL_COMMUNITY): Admission: EM | Admit: 2009-12-04 | Discharge: 2009-12-04 | Payer: Self-pay | Admitting: Emergency Medicine

## 2009-12-04 ENCOUNTER — Ambulatory Visit: Payer: Self-pay | Admitting: Infectious Disease

## 2009-12-04 LAB — HM PAP SMEAR: HM Pap smear: NEGATIVE

## 2009-12-12 ENCOUNTER — Encounter: Payer: Self-pay | Admitting: Internal Medicine

## 2010-02-07 ENCOUNTER — Encounter: Payer: Self-pay | Admitting: Internal Medicine

## 2010-02-25 ENCOUNTER — Ambulatory Visit: Payer: Self-pay | Admitting: Internal Medicine

## 2010-02-25 LAB — CONVERTED CEMR LAB
ALT: 22 units/L (ref 0–35)
CO2: 20 meq/L (ref 19–32)
Calcium: 8.3 mg/dL — ABNORMAL LOW (ref 8.4–10.5)
Chloride: 107 meq/L (ref 96–112)
Eosinophils Absolute: 0.1 10*3/uL (ref 0.0–0.7)
Glucose, Bld: 188 mg/dL — ABNORMAL HIGH (ref 70–99)
HIV 1 RNA Quant: <48 copies/mL (ref <48)
Hemoglobin: 10.8 g/dL — ABNORMAL LOW (ref 12.0–15.0)
Lymphs Abs: 1.9 10*3/uL (ref 0.7–4.0)
MCV: 89.2 fL (ref >=78.0)
Monocytes Absolute: 0.7 10*3/uL (ref 0.1–1.0)
Monocytes Relative: 10 % (ref 3–12)
Neutro Abs: 4.5 10*3/uL (ref 1.7–7.7)
Neutrophils Relative %: 63 % (ref 43–77)
RBC: 3.78 M/uL — ABNORMAL LOW (ref 3.87–5.11)
Sodium: 137 meq/L (ref 135–145)
Total Protein: 6.4 g/dL (ref 6.0–8.3)
WBC: 7.1 10*3/uL (ref 4.0–10.5)

## 2010-03-12 ENCOUNTER — Ambulatory Visit: Payer: Self-pay | Admitting: Internal Medicine

## 2010-05-10 ENCOUNTER — Emergency Department (HOSPITAL_COMMUNITY): Admission: EM | Admit: 2010-05-10 | Discharge: 2010-05-10 | Payer: Self-pay | Admitting: Emergency Medicine

## 2010-06-17 ENCOUNTER — Ambulatory Visit: Payer: Self-pay | Admitting: Internal Medicine

## 2010-06-17 LAB — CONVERTED CEMR LAB
ALT: 31 units/L (ref 0–35)
AST: 52 units/L — ABNORMAL HIGH (ref 0–37)
Basophils Absolute: 0 10*3/uL (ref 0.0–0.1)
Basophils Relative: 1 % (ref 0–1)
Calcium: 8.4 mg/dL (ref 8.4–10.5)
Chloride: 102 meq/L (ref 96–112)
Creatinine, Ser: 0.85 mg/dL (ref 0.40–1.20)
MCHC: 32.6 g/dL (ref 30.0–36.0)
Neutro Abs: 3.3 10*3/uL (ref 1.7–7.7)
Neutrophils Relative %: 57 % (ref 43–77)
Platelets: 279 10*3/uL (ref 150–400)
Potassium: 4.3 meq/L (ref 3.5–5.3)
RDW: 16.2 % — ABNORMAL HIGH (ref 11.5–15.5)
Sodium: 135 meq/L (ref 135–145)

## 2010-07-02 ENCOUNTER — Ambulatory Visit: Payer: Self-pay | Admitting: Internal Medicine

## 2010-09-01 ENCOUNTER — Emergency Department (HOSPITAL_COMMUNITY): Admission: EM | Admit: 2010-09-01 | Discharge: 2010-09-01 | Payer: Self-pay | Admitting: Emergency Medicine

## 2010-09-04 ENCOUNTER — Inpatient Hospital Stay (HOSPITAL_COMMUNITY): Admission: EM | Admit: 2010-09-04 | Discharge: 2010-09-09 | Payer: Self-pay | Admitting: Emergency Medicine

## 2010-09-04 ENCOUNTER — Ambulatory Visit: Payer: Self-pay | Admitting: Internal Medicine

## 2010-09-04 ENCOUNTER — Encounter: Payer: Self-pay | Admitting: Internal Medicine

## 2010-09-05 ENCOUNTER — Encounter: Payer: Self-pay | Admitting: Internal Medicine

## 2010-09-05 DIAGNOSIS — K861 Other chronic pancreatitis: Secondary | ICD-10-CM | POA: Insufficient documentation

## 2010-10-07 ENCOUNTER — Ambulatory Visit: Payer: Self-pay | Admitting: Internal Medicine

## 2010-10-07 LAB — CONVERTED CEMR LAB
ALT: 22 units/L (ref 0–35)
Albumin: 3.4 g/dL — ABNORMAL LOW (ref 3.5–5.2)
Basophils Absolute: 0 10*3/uL (ref 0.0–0.1)
CO2: 20 meq/L (ref 19–32)
Calcium: 8.7 mg/dL (ref 8.4–10.5)
Chloride: 105 meq/L (ref 96–112)
Eosinophils Relative: 2 % (ref 0–5)
Glucose, Bld: 204 mg/dL — ABNORMAL HIGH (ref 70–99)
HIV 1 RNA Quant: <20 copies/mL (ref <20)
Hgb A1c MFr Bld: 6.8 % — ABNORMAL HIGH (ref <5.7)
Lymphs Abs: 2.3 10*3/uL (ref 0.7–4.0)
MCHC: 34 g/dL (ref 30.0–36.0)
MCV: 88.4 fL (ref 78.0–100.0)
Neutrophils Relative %: 48 % (ref 43–77)
RBC: 3.79 M/uL — ABNORMAL LOW (ref 3.87–5.11)
Sodium: 135 meq/L (ref 135–145)
Total Protein: 7.2 g/dL (ref 6.0–8.3)

## 2010-10-09 ENCOUNTER — Telehealth (INDEPENDENT_AMBULATORY_CARE_PROVIDER_SITE_OTHER): Payer: Self-pay | Admitting: *Deleted

## 2010-10-10 ENCOUNTER — Ambulatory Visit: Payer: Self-pay | Admitting: Internal Medicine

## 2010-10-10 DIAGNOSIS — G8929 Other chronic pain: Secondary | ICD-10-CM

## 2010-12-01 ENCOUNTER — Emergency Department (HOSPITAL_COMMUNITY)
Admission: EM | Admit: 2010-12-01 | Discharge: 2010-12-01 | Payer: Self-pay | Source: Home / Self Care | Admitting: Emergency Medicine

## 2010-12-09 ENCOUNTER — Encounter: Payer: Self-pay | Admitting: Internal Medicine

## 2010-12-18 ENCOUNTER — Ambulatory Visit: Admission: RE | Admit: 2010-12-18 | Discharge: 2010-12-18 | Payer: Self-pay | Source: Home / Self Care

## 2010-12-18 LAB — HM DIABETES FOOT EXAM

## 2010-12-19 LAB — CONVERTED CEMR LAB
Cholesterol: 123 mg/dL (ref 0–200)
HDL: 39 mg/dL — ABNORMAL LOW (ref >39)
LDL Cholesterol: 51 mg/dL (ref 0–99)
Total CHOL/HDL Ratio: 3.2
Triglycerides: 163 mg/dL — ABNORMAL HIGH (ref <150)
VLDL: 33 mg/dL (ref 0–40)

## 2010-12-23 ENCOUNTER — Ambulatory Visit (HOSPITAL_COMMUNITY)
Admission: RE | Admit: 2010-12-23 | Discharge: 2010-12-23 | Payer: Self-pay | Source: Home / Self Care | Attending: Internal Medicine | Admitting: Internal Medicine

## 2010-12-23 LAB — HM MAMMOGRAPHY: HM Mammogram: NEGATIVE

## 2010-12-25 ENCOUNTER — Encounter: Payer: Self-pay | Admitting: Infectious Diseases

## 2010-12-25 ENCOUNTER — Ambulatory Visit
Admission: RE | Admit: 2010-12-25 | Discharge: 2010-12-25 | Payer: Self-pay | Source: Home / Self Care | Attending: Internal Medicine | Admitting: Internal Medicine

## 2010-12-25 ENCOUNTER — Encounter: Payer: Self-pay | Admitting: Internal Medicine

## 2010-12-25 LAB — CONVERTED CEMR LAB
AST: 52 units/L — ABNORMAL HIGH (ref 0–37)
Alkaline Phosphatase: 109 units/L (ref 39–117)
BUN: 19 mg/dL (ref 6–23)
Basophils Absolute: 0.1 10*3/uL (ref 0.0–0.1)
Calcium: 9.2 mg/dL (ref 8.4–10.5)
Chloride: 106 meq/L (ref 96–112)
Creatinine, Ser: 1.05 mg/dL (ref 0.40–1.20)
Eosinophils Absolute: 0.2 10*3/uL (ref 0.0–0.7)
Eosinophils Relative: 2 % (ref 0–5)
HCT: 35.3 % — ABNORMAL LOW (ref 36.0–46.0)
Hemoglobin: 11.7 g/dL — ABNORMAL LOW (ref 12.0–15.0)
Lymphocytes Relative: 29 % (ref 12–46)
Lymphs Abs: 2.5 10*3/uL (ref 0.7–4.0)
MCV: 89.8 fL (ref 78.0–100.0)
Monocytes Absolute: 0.8 10*3/uL (ref 0.1–1.0)
RDW: 15.5 % (ref 11.5–15.5)
Total Bilirubin: 0.4 mg/dL (ref 0.3–1.2)

## 2010-12-29 ENCOUNTER — Encounter: Payer: Self-pay | Admitting: Internal Medicine

## 2010-12-29 ENCOUNTER — Ambulatory Visit
Admission: RE | Admit: 2010-12-29 | Discharge: 2010-12-29 | Payer: Self-pay | Source: Home / Self Care | Attending: Internal Medicine | Admitting: Internal Medicine

## 2010-12-29 LAB — CONVERTED CEMR LAB
OCCULT 2: NEGATIVE
OCCULT 3: NEGATIVE

## 2010-12-29 LAB — FECAL OCCULT BLOOD, GUAIAC: Fecal Occult Blood: NEGATIVE

## 2010-12-30 ENCOUNTER — Ambulatory Visit: Admit: 2010-12-30 | Payer: Self-pay

## 2011-01-01 ENCOUNTER — Telehealth: Payer: Self-pay | Admitting: *Deleted

## 2011-01-06 NOTE — Assessment & Plan Note (Signed)
Summary: 39month f/u [mkj]   Primary Provider:  Yisroel Ramming MD  CC:  follow-up visit and lab results.  History of Present Illness: Pt here for f/u. She has been experiencing a tremor after her cymbalta was increased.  She saw her PCP yesterday and she said they are going to adjust her meds. No missed doses of her Atripla.  Depression History:      The patient is having a depressed mood most of the day and has a diminished interest in her usual daily activities.        The patient denies that she feels like life is not worth living, denies that she wishes that she were dead, and denies that she has thought about ending her life.        Preventive Screening-Counseling & Management  Alcohol-Tobacco     Alcohol drinks/day: 0     Smoking Status: current     Smoking Cessation Counseling: yes     Packs/Day: <0.25     Passive Smoke Exposure: yes  Caffeine-Diet-Exercise     Caffeine use/day: 1 cup of coffee in the morning     Does Patient Exercise: no     Type of exercise: walking     Exercise (avg: min/session): <30     Times/week: 7  Hep-HIV-STD-Contraception     HIV Risk: no risk noted  Safety-Violence-Falls     Seat Belt Use: yes      Sexual History:  n/a.        Drug Use:  former.     Updated Prior Medication List: LANTUS 100 UNIT/ML  SOLN (INSULIN GLARGINE) 20 units a day NOVOLOG 100 UNIT/ML  SOLN (INSULIN ASPART) 5 untis three times a day with meals and sliding scale COLACE 100 MG  CAPS (DOCUSATE SODIUM) Take 1 tablet by mouth once a day ATRIPLA 600-200-300 MG  TABS (EFAVIRENZ-EMTRICITAB-TENOFOVIR) Take 1 tablet by mouth at bedtime TRAZODONE HCL 100 MG TABS (TRAZODONE HCL) Take 1 tablet by mouth at bedtime CETIRIZINE HCL 10 MG TABS (CETIRIZINE HCL) Take 1 tablet by mouth at bedtime PRILOSEC 20 MG CPDR (OMEPRAZOLE) Take 1 tablet by mouth once a day LYRICA 100 MG CAPS (PREGABALIN) Take 1 capsule by mouth three times a day IBUPROFEN 600 MG TABS (IBUPROFEN) Take 1 tablet  by mouth three times a day MIRALAX  POWD (POLYETHYLENE GLYCOL 3350) as directed DESIPRAMINE HCL 25 MG TABS (DESIPRAMINE HCL) Take 1 tablet by mouth at bedtime CYMBALTA 30 MG CPEP (DULOXETINE HCL) Take 1 tablet by mouth once a day OXYCONTIN 30 MG XR12H-TAB (OXYCODONE HCL) Take 1 tablet by mouth two times a day OXYCODONE HCL 5 MG TABS (OXYCODONE HCL) Take 1 tablet by mouth every 6 hours as needed CYMBALTA 60 MG CPEP (DULOXETINE HCL) take one capsule one time a day FERROUS SULFATE 325 (65 FE) MG TABS (FERROUS SULFATE) take one tablet one time a day  Current Allergies (reviewed today): No known allergies  Past History:  Past Medical History: Last updated: 12/04/2009 Depression Hypertension diabetes Neuropathy ?Pancreatitis calcificatino on prior CT scan Past alcohol use  Review of Systems  The patient denies anorexia, fever, and weight loss.    Vital Signs:  Patient profile:   55 year old female Menstrual status:  postmenopausal Height:      65 inches (165.10 cm) Weight:      134.0 pounds (60.91 kg) BMI:     22.38 Temp:     97.4 degrees F (36.33 degrees C) oral Pulse rate:   118 /  minute BP sitting:   98 / 73  (right arm)  Vitals Entered By: Wendall Mola CMA Duncan Dull) (July 02, 2010 9:14 AM) CC: follow-up visit, lab results Is Patient Diabetic? Yes Did you bring your meter with you today? No Pain Assessment Patient in pain? no      Nutritional Status BMI of 19 -24 = normal Nutritional Status Detail appetite "on and off"  Does patient need assistance? Functional Status Self care Ambulation Normal Comments no missed doses of meds per patient   Physical Exam  General:  alert, well-developed, well-nourished, and well-hydrated.   Head:  normocephalic and atraumatic.   Mouth:  pharynx pink and moist.   Lungs:  normal breath sounds.      Impression & Recommendations:  Problem # 1:  HIV INFECTION (ICD-042) Pt.s most recent CD4ct was 540 and VL <48 .  Pt  instructed to continue the current antiretroviral regimen.  Pt encouraged to take medication regularly and not miss doses.  Pt will f/u in 3 months for repeat blood work and will see me 2 weeks later.  Diagnostics Reviewed:  HIV: CDC-defined AIDS (11/27/2009)   CD4: 540 (06/18/2010)   WBC: 5.8 (06/17/2010)   Hgb: 11.9 (06/17/2010)   HCT: 36.5 (06/17/2010)   Platelets: 279 (06/17/2010) HIV-1 RNA: <48 copies/mL (06/17/2010)   HBSAg: NEG (10/25/2007)  Orders: Est. Patient Level III (99213)Future Orders: T-CD4SP ([facility]) (CD4SP) ... 09/30/2010 T-HIV Viral Load [id number]) ... 09/30/2010 T-Comprehensive Metabolic Panel [phone]) ... 09/30/2010 T-CBC w/Diff ([id number]) ... 09/30/2010  Problem # 2:  IDDM (ICD-250.01) check HgbA1c next visit Her updated medication list for this problem includes:    Lantus 100 Unit/ml Soln (Insulin glargine) .Marland Kitchen... 20 units a day    Novolog 100 Unit/ml Soln (Insulin aspart) .Marland KitchenMarland KitchenMarland KitchenMarland Kitchen 5 untis three times a day with meals and sliding scale    Aspirin 325 Mg Tabs (Aspirin) .Marland Kitchen... Take 1 tablet by mouth once a day  Future Orders: T-Hgb A1C (in-house) ([id number]) ... 09/30/2010  Medications Added to Medication List This Visit: 1)  Cymbalta 60 Mg Cpep (Duloxetine hcl) .... Take one capsule one time a day 2)  Ferrous Sulfate 325 (65 Fe) Mg Tabs (Ferrous sulfate) .... Take one tablet one time a day 3)  Ferrous Sulfate 325 (65 Fe) Mg Tabs (Ferrous sulfate) .... Take one tablet one time a day 4)  Aspirin 325 Mg Tabs (Aspirin) .... Take 1 tablet by mouth once a day 5)  Mobic 7.5 Mg Tabs (Meloxicam) .... Take 1 tablet by mouth once a day 6)  Promethazine Hcl 25 Mg Tabs (Promethazine hcl) .... Take 1 tablet by mouth once a day  Patient Instructions: 1)  Please schedule a follow-up appointment in 3 months, 2 weeks after labs.

## 2011-01-06 NOTE — Miscellaneous (Signed)
  Clinical Lists Changes  Orders: Added new Test order of T-Hgb A1C (in-house) ([id number]) - Signed 

## 2011-01-06 NOTE — Miscellaneous (Signed)
Summary: RW Update  Clinical Lists Changes  Observations: Added new observation of SYPHILIS TX: No (02/07/2010 12:08)

## 2011-01-06 NOTE — Letter (Signed)
Summary: Results Follow-up Letter  [facility]  [address]   [place], [place] [address]   Phone: [phone]  Fax: [phone]           December 12, 2009  [address] [place], [place]  [address]  Dear Ms. Dao,   The following are the results of your recent test(s):  Test     Result     Pap Smear    Normal_XXX___  Not Normal_____  Comments:   I will see you in one year for your next PAP Smear.  Thank you for coming to the clinic.  Happy New Year!!   Sincerely,    Jennet Maduro RN [facility]

## 2011-01-06 NOTE — Discharge Summary (Signed)
Summary: Hospital Discharge Update    Hospital Discharge Update:  Date of Admission: 09/04/2010 Date of Discharge: 09/09/2010  Brief Summary:  [name] was admitted for abdominal pain and diarrhea 2/2 pancreatic insufficiency from chronic pancreatitis as seen on abdominal plain film and CT (from '09) for which she was started on Creon. This improved during her hospitalization. She was afebrile and clinically stable throughout her admission. C. diff toxins and stool culture were negative. All her home meds were restarted for her chronic issues. Her ASA was changed to 81mg .   Labs needed at follow-up: Comprehensive metabolic panel  Other follow-up issues:  She has chronic pancreatitis that has not been treated. Pls ensure that she is taking her Creon as specified in her med list.  Problem list changes:  Added new problem of CHRONIC PANCREATITIS (ICD-577.1) - Signed  Medication list changes:  Added new medication of CREON 6000 UNIT CPEP (PANCRELIPASE (LIP-PROT-AMYL)) take 2 tablets by mouth with meals and one tablet with snacks - Signed Changed medication from ASPIRIN 325 MG TABS (ASPIRIN) Take 1 tablet by mouth once a day to ASPIRIN EC 81 MG TBEC (ASPIRIN) take one tab by mouth daily Rx of CREON 6000 UNIT CPEP (PANCRELIPASE (LIP-PROT-AMYL)) take 2 tablets by mouth with meals and one tablet with snacks;  #120 x 12;  Signed;  Entered by: Jaci Lazier MD;  Authorized by: Jaci Lazier MD;  Method used: Print then Give to Patient  The medication, problem, and allergy lists have been updated.  Please see the dictated discharge summary for details.  Discharge medications:  LANTUS 100 UNIT/ML  SOLN (INSULIN GLARGINE) 20 units a day NOVOLOG 100 UNIT/ML  SOLN (INSULIN ASPART) 5 untis three times a day with meals and sliding scale COLACE 100 MG  CAPS (DOCUSATE SODIUM) Take 1 tablet by mouth once a day ATRIPLA 600-200-300 MG  TABS (EFAVIRENZ-EMTRICITAB-TENOFOVIR) Take 1 tablet by mouth at  bedtime TRAZODONE HCL 100 MG TABS (TRAZODONE HCL) Take 1 tablet by mouth at bedtime CETIRIZINE HCL 10 MG TABS (CETIRIZINE HCL) Take 1 tablet by mouth at bedtime PRILOSEC 20 MG CPDR (OMEPRAZOLE) Take 1 tablet by mouth once a day LYRICA 100 MG CAPS (PREGABALIN) Take 1 capsule by mouth three times a day IBUPROFEN 600 MG TABS (IBUPROFEN) Take 1 tablet by mouth three times a day MIRALAX  POWD (POLYETHYLENE GLYCOL 3350) as directed DESIPRAMINE HCL 25 MG TABS (DESIPRAMINE HCL) Take 1 tablet by mouth at bedtime CYMBALTA 30 MG CPEP (DULOXETINE HCL) Take 1 tablet by mouth once a day OXYCONTIN 30 MG XR12H-TAB (OXYCODONE HCL) Take 1 tablet by mouth two times a day OXYCODONE HCL 5 MG TABS (OXYCODONE HCL) Take 1 tablet by mouth every 6 hours as needed CYMBALTA 60 MG CPEP (DULOXETINE HCL) take one capsule one time a day FERROUS SULFATE 325 (65 FE) MG TABS (FERROUS SULFATE) take one tablet one time a day ASPIRIN EC 81 MG TBEC (ASPIRIN) take one tab by mouth daily MOBIC 7.5 MG TABS (MELOXICAM) Take 1 tablet by mouth once a day PROMETHAZINE HCL 25 MG TABS (PROMETHAZINE HCL) Take 1 tablet by mouth once a day CREON 6000 UNIT CPEP (PANCRELIPASE (LIP-PROT-AMYL)) take 2 tablets by mouth with meals and one tablet with snacks  Other patient instructions:  Pls make sure to take your medications as specified. Make sure to f/u with Dr. Philipp Deputy on Oct 14 at 9.45am. Report any fevers, trouble breathing, fever to 101 or greater to your nearest ED.  Note: Hospital Discharge Medications & Other  Instructions handout was printed, one copy for patient and a second copy to be placed in hospital chart.

## 2011-01-06 NOTE — Initial Assessments (Signed)
INTERNAL MEDICINE ADMISSION HISTORY AND PHYSICAL  Attending: Dr. Doneen Poisson First Contact: Dr. Narda Bonds [phone] Second Contact: Dr. Arvilla Market [phone]  PCP: Dr. Yisroel Ramming CC: Generalized body weakness and diarrhea  HPI: [name] is a 55 y/o woman w/ pmh outlined below. Lives in a ALF for the last 3 yrs and is here today for with complaints of generalized weakness that started yesterday. It is nonfocal, no numbness, tingling, headache or other neurological deficits. She also complains of diarrhea of about 3-5bms for the past 2 months. Her usual bms are 2-3 daily. She describes it as non-watery, yellow, no blood or mucus noted. It is not associated with food intake, and is relieved mildly with Loperamide. She also complains of mild abdominal pain since last 2 months and is 2-3/10. The pain is aggravated with palpation and relieved as above. She denied any changes in appetite, n/v, fevers, chills, melena, hematochezia, weight loss/gain, sob, cp, dysuria or other systemic symptoms.  ALLERGIES: NKDA   PAST MEDICAL HISTORY: Past Medical History:  HIV (last CD4 540, HIV viral load <48 from 06/2010) Hepatitis B and C Depression Hypertension Diabetes Neuropathy ?Pancreatitis calcification on prior CT scan GERD PVD Past polysubstance abuse  MEDICATIONS:  LANTUS 100 UNIT/ML  SOLN (INSULIN GLARGINE) 20 units a day NOVOLOG 100 UNIT/ML  SOLN (INSULIN ASPART) 5 untis three times a day with meals and sliding scale ATRIPLA 600-200-300 MG  TABS (EFAVIRENZ-EMTRICITAB-TENOFOVIR) Take 1 tablet by mouth at bedtime TRAZODONE HCL 100 MG TABS (TRAZODONE HCL) Take 1 tablet by mouth at bedtime CETIRIZINE HCL 10 MG TABS (CETIRIZINE HCL) Take 1 tablet by mouth at bedtime PRILOSEC 20 MG CPDR (OMEPRAZOLE) Take 1 tablet by mouth once a day LYRICA 100 MG CAPS (PREGABALIN) Take 1 capsule by mouth three times a day IBUPROFEN 600 MG TABS (IBUPROFEN) Take 1 tablet by mouth three times a day DESIPRAMINE HCL 25  MG TABS (DESIPRAMINE HCL) Take 1 tablet by mouth at bedtime OXYCONTIN 30 MG XR12H-TAB (OXYCODONE HCL) Take 1 tablet by mouth two times a day OXYCODONE HCL 5 MG TABS (OXYCODONE HCL) Take 1 tablet by mouth every 6 hours as needed CYMBALTA 60 MG CPEP (DULOXETINE HCL) take one capsule one time a day FERROUS SULFATE 325 (65 FE) MG TABS (FERROUS SULFATE) take one tablet one time a day ASPIRIN 325 MG TABS (ASPIRIN) Take 1 tablet by mouth once a day MOBIC 7.5 MG TABS (MELOXICAM) Take 1 tablet by mouth once a day PROMETHAZINE HCL 25 MG TABS (PROMETHAZINE HCL) Take 1 tablet by mouth once a day LOPERAMIDE 2MG  with every bm   SOCIAL HISTORY: Alcohol use- denies currenlty, no other recreational drugs livs in ALF Smokes 1/2 pack per day for "long time"   FAMILY HISTORY: Noncontributory  ROS:per HPI  VITALS:  T: 98.2 P:71-122  BP:140/80  R: 16 O2SAT:99%  ON:RA  PHYSICAL EXAM: General:  alert, well-developed, and cooperative to examination.   Head:  normocephalic and atraumatic.   Eyes:  vision grossly intact, pupils equal, pupils round, pupils reactive to light, no injection and anicteric.   Mouth:  pharynx pink and moist, no erythema, and no exudates. Neck:  supple, full ROM, no thyromegaly, no JVD, and no carotid bruits.   Lungs:  normal respiratory effort, no accessory muscle use, normal breath sounds, no crackles, and no wheezes.  Heart:  normal rate, regular rhythm, no murmur, no gallop, and no rub.   Abdomen:  soft, tender to palpation diffusely (more peripherally), normal bowel sounds, no distention, no  guarding, no rebound tenderness, no hepatomegaly, and no splenomegaly. Msk:  no joint swelling, no joint warmth, and no redness over joints.   Pulses:  2+ DP/PT pulses bilaterally Extremities:  No cyanosis, clubbing, edema  Neurologic:  alert & oriented X3, cranial nerves II-XII intact, strength normal in all extremities, sensation intact to light touch, and gait normal.   Skin:  turgor  normal and no rashes.   Psych:  Oriented X3,  poor eye contact, anhedonic  LABS:   WBC                                      5.1               4.0-10.5         K/uL  RBC                                      4.01              3.87-5.11        MIL/uL  Hemoglobin (HGB)                         11.8       l      12.0-15.0        g/dL  Hematocrit (HCT)                         35.0       l      36.0-46.0        %  MCV                                      87.3              78.0-100.0       fL  MCH -                                    29.4              26.0-34.0        pg  MCHC                                     33.7              30.0-36.0        g/dL  RDW                                      14.3              11.5-15.5        %  Platelet Count (PLT)                     208               150-400  K/uL  Neutrophils, %                           61                43-77            %  Lymphocytes, %                           27                12-46            %  Monocytes, %                             10                3-12             %  Eosinophils, %                           2                 0-5              %  Basophils, %                             1                 0-1              %  Neutrophils, Absolute                    3.1               1.7-7.7          K/uL  Lymphocytes, Absolute                    1.4               0.7-4.0          K/uL  Monocytes, Absolute                      0.5               0.1-1.0          K/uL  Eosinophils, Absolute                    0.1               0.0-0.7          K/uL  Basophils, Absolute                      0.0               0.0-0.1          K/uL  Sodium (NA)                              134        l      135-145  mEq/L  Potassium (K)                            4.2               3.5-5.1          mEq/L  Chloride                                 105               96-112           mEq/L  CO2                                      26                 19-32            mEq/L  Glucose                                  118        h      70-99            mg/dL  BUN                                      4          l      6-23             mg/dL  Creatinine                               0.74              0.4-1.2          mg/dL  GFR, Est Non African American            >60               >60              mL/min  GFR, Est African American                >60               >60              mL/min    Oversized comment, see footnote  1  Bilirubin, Total                         0.6               0.3-1.2          mg/dL  Alkaline Phosphatase                     91                39-117           U/L  SGOT (AST)  66         h      0-37             U/L  SGPT (ALT)                               36         h      0-35             U/L  Total  Protein                           6.6               6.0-8.3          g/dL  Albumin-Blood                            2.5        l      3.5-5.2          g/dL  Calcium                                  8.4               8.4-10.5         mg/dL   Lactic Acid, Venous                      1.4               0.5-2.2          mmol/L  Color, Urine                             YELLOW            YELLOW  Appearance                               CLEAR             CLEAR  Specific Gravity                         1.010             1.005-1.030  pH                                       6.0               5.0-8.0  Urine Glucose                            NEGATIVE          NEG              mg/dL  Bilirubin                                NEGATIVE          NEG  Ketones                                  NEGATIVE          NEG              mg/dL  Blood                                    NEGATIVE          NEG  Protein                                  NEGATIVE          NEG              mg/dL  Urobilinogen                             0.2               0.0-1.0          mg/dL  Nitrite                                   NEGATIVE          NEG  Leukocytes                               TRACE      a      NEG  Squamous Epithelial / LPF                RARE              RARE  Casts / HPF                              SEE NOTE.  a      NEG    GRANULAR CAST  WBC / HPF                                3-6               <3               WBC/hpf  RBC / HPF                                0-2               <3               RBC/hpf  Bacteria / HPF                           RARE              RARE  Urine-Other  SEE NOTE.    MUCOUS PRESENT  LDH                                      183               94-250           U/L  Procalcitonin                            <0.10                              ng/mL  CXR IMPRESSION: Suboptimal inspiration accounts for bibasilar atelectasis.  Abd plain film pending.  ASSESSMENT AND PLAN:  1.  Diarrhea - This is chronic and likely a GI complication of her HIV - and as a result, she's susceptible to a host of bacterial, protozoal and viral complications along the GI tract. Her generalized body weakness is also likely secondary to her enterocolitis. Will-> - admit to regular bed -obtain stool culture, stool ova and parasites, stool for gram stain and AFB,  stool for crypto and giardia screen, stool for microsporidium, isospora, cyclospora -c.diff toxin x 3 -Loperamide 2mg  by mouth after each lose stool - will start her on Flagyl 500mg  by mouth three times a day, Cipro 400mg  IV q12h and Zofran for nausea - Carb modified diet since she's tolerating by mouth intake ok, and NS 160cc/hr for 12 hrs then NSL  - f/u am abdominal series given h/o abd pain  2. HIV - continue Atripla, f/u CD4 and RNA viral load.  3. DM2- continue Lantus 20units subq at bedtime and SSI sensitive with meal coverage  4. Depression - Cont Trazadone and Cymbalta  5. GERD - cont Protonix  6. Preventative - cont ASA, Lovenox for dvt ppx   Attending Physician:
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
## 2011-01-06 NOTE — Assessment & Plan Note (Signed)
Summary: 33month f/u [mkj]   Primary Provider:  Yisroel Ramming MD  CC:  follow-up visit, legs continue  to give her a problem, and being taken care of by the Assisted Living NP.  Legs are fatigued and feel like they are going to give out.Marland Kitchen  History of Present Illness: Pt here for lab results.  She continues to have leg pain but apparently the nurse who sees her at the NH is having trouble getting her referred for help.  They have increased her pain medication.   Preventive Screening-Counseling & Management  Alcohol-Tobacco     Alcohol drinks/day: 0     Smoking Status: current     Smoking Cessation Counseling: yes     Packs/Day: <0.25     Passive Smoke Exposure: yes  Caffeine-Diet-Exercise     Caffeine use/day: 1 cup of coffee in the morning     Does Patient Exercise: no     Type of exercise: walking     Exercise (avg: min/session): <30     Times/week: 7  Hep-HIV-STD-Contraception     HIV Risk: no risk noted  Safety-Violence-Falls     Seat Belt Use: yes  Comments: declined condoms      Sexual History:  n/a.        Drug Use:  former.     Updated Prior Medication List: LANTUS 100 UNIT/ML  SOLN (INSULIN GLARGINE) 20 units a day NOVOLOG 100 UNIT/ML  SOLN (INSULIN ASPART) 5 untis three times a day with meals and sliding scale COLACE 100 MG  CAPS (DOCUSATE SODIUM) Take 1 tablet by mouth once a day ATRIPLA 600-200-300 MG  TABS (EFAVIRENZ-EMTRICITAB-TENOFOVIR) Take 1 tablet by mouth at bedtime TRAZODONE HCL 100 MG TABS (TRAZODONE HCL) Take 1 tablet by mouth at bedtime CETIRIZINE HCL 10 MG TABS (CETIRIZINE HCL) Take 1 tablet by mouth at bedtime PRILOSEC 20 MG CPDR (OMEPRAZOLE) Take 1 tablet by mouth once a day LYRICA 100 MG CAPS (PREGABALIN) Take 1 capsule by mouth three times a day IBUPROFEN 600 MG TABS (IBUPROFEN) Take 1 tablet by mouth three times a day MIRALAX  POWD (POLYETHYLENE GLYCOL 3350) as directed DESIPRAMINE HCL 25 MG TABS (DESIPRAMINE HCL) Take 1 tablet by mouth at  bedtime CYMBALTA 30 MG CPEP (DULOXETINE HCL) Take 1 tablet by mouth once a day OXYCONTIN 30 MG XR12H-TAB (OXYCODONE HCL) Take 1 tablet by mouth two times a day OXYCODONE HCL 5 MG TABS (OXYCODONE HCL) Take 1 tablet by mouth every 6 hours as needed  Current Allergies (reviewed today): No known allergies  Past History:  Past Medical History: Last updated: 12/04/2009 Depression Hypertension diabetes Neuropathy ?Pancreatitis calcificatino on prior CT scan Past alcohol use  Social History: Sexual History:  n/a  Vital Signs:  Patient profile:   55 year old female Menstrual status:  postmenopausal Height:      65 inches (165.10 cm) Weight:      141.75 pounds (64.43 kg) BMI:     23.67 Temp:     97.7 degrees F (36.50 degrees C) Pulse rate:   106 / minute BP sitting:   107 / 77  (left arm) Cuff size:   regular  Vitals Entered By: Jennet Maduro RN (March 12, 2010 10:39 AM) CC: follow-up visit, legs continue  to give her a problem, being taken care of by the Assisted Living NP.  Legs are fatigued and feel like they are going to give out. Is Patient Diabetic? Yes Did you bring your meter with you today? 55 before  breakfast Pain Assessment Patient in pain? no      Nutritional Status BMI of 19 -24 = normal Nutritional Status Detail appetite "good"  Have you ever been in a relationship where you felt threatened, hurt or afraid?No   Does patient need assistance? Functional Status Self care Ambulation Normal Comments no missed doses   Physical Exam  General:  alert, well-developed, well-nourished, and well-hydrated.   Head:  normocephalic and atraumatic.   Mouth:  pharynx pink and moist.   Lungs:  normal breath sounds.      Impression & Recommendations:  Problem # 1:  HIV INFECTION (ICD-042) Pt.s most recent CD4ct was 510 and VL  .  Pt instructed to continue the current antiretroviral regimen.  Pt encouraged to take medication regularly and not miss doses.  Pt will f/u  in 3 months for repeat blood work and will see me 2 weeks later.  Diagnostics Reviewed:  HIV: CDC-defined AIDS (11/27/2009)   CD4: 510 (02/26/2010)   WBC: 7.1 (02/25/2010)   Hgb: 10.8 (02/25/2010)   HCT: 33.7 (02/25/2010)   Platelets: 285 (02/25/2010) HIV-1 RNA: <48 copies/mL (02/25/2010)   HBSAg: NEG (10/25/2007)  Orders: Est. Patient Level III (99213)Future Orders: T-CD4SP ([facility]) (CD4SP) ... 06/10/2010 T-HIV Viral Load [id number]) ... 06/10/2010 T-Comprehensive Metabolic Panel [id number]) ... 06/10/2010 T-CBC w/Diff ([id number]) ... 06/10/2010  Medications Added to Medication List This Visit: 1)  Desipramine Hcl 25 Mg Tabs (Desipramine hcl) .... Take 1 tablet by mouth at bedtime 2)  Cymbalta 30 Mg Cpep (Duloxetine hcl) .... Take 1 tablet by mouth once a day 3)  Oxycontin 30 Mg Xr12h-tab (Oxycodone hcl) .... Take 1 tablet by mouth two times a day 4)  Oxycodone Hcl 5 Mg Tabs (Oxycodone hcl) .... Take 1 tablet by mouth every 6 hours as needed  Patient Instructions: 1)  Please schedule a follow-up appointment in 3 months, 2 weeks after labs.   Prevention & Chronic Care Immunizations   Influenza vaccine: Historical  (11/27/2009)    Tetanus booster: Not documented    Pneumococcal vaccine: Pneumovax  (03/21/2008)  Colorectal Screening   Hemoccult: Not documented    Colonoscopy: Not documented  Other Screening   Pap smear: NEGATIVE FOR INTRAEPITHELIAL LESIONS OR MALIGNANCY.  (12/04/2009)    Mammogram: BI-RADS CATEGORY 2:  Benign finding(s).^MM DIGITAL DIAG LTD L  (01/22/2009)   Smoking status: current  (03/12/2010)   Smoking cessation counseling: yes  (03/12/2010)  Diabetes Mellitus   HgbA1C: 7.6  (02/25/2010)    Eye exam: Not documented    Foot exam: Not documented   High risk foot: Not documented   Foot care education: Not documented    Urine microalbumin/creatinine ratio: Not documented  Lipids   Total Cholesterol: 113  (08/14/2009)   LDL: 51   (08/14/2009)   LDL Direct: Not documented   HDL: 38  (08/14/2009)   Triglycerides: 119  (08/14/2009)  Hypertension   Last Blood Pressure: 107 / 77  (03/12/2010)   Serum creatinine: 1.04  (02/25/2010)   Serum potassium 4.2  (02/25/2010) CMP ordered   Self-Management Support :    Diabetes self-management support: Not documented    Hypertension self-management support: Not documented

## 2011-01-06 NOTE — Progress Notes (Signed)
Summary: PREVENTIVE COLONOSCOPY  Phone Note Outgoing Call   Call placed by: Shon Hough,  October 09, 2010 4:03 PM Call placed to: Patient Summary of Call: Checked EMR, [facility], and for a paperchart.  [facility].   No information was found in regards to patient having a colonoscopy done.  Attempted to contact the patient by phone and the voice mail for the patient was full.  Patient currently has Medicaid. Initial call taken by: Shon Hough,  October 09, 2010 4:05 PM

## 2011-01-06 NOTE — Assessment & Plan Note (Signed)
Summary: HFU-EST CARE WITH IM DR. PER DR MILLS   Vital Signs:  Patient profile:   55 year old female Menstrual status:  postmenopausal Height:      65 inches (165.10 cm) Weight:      136.7 pounds (62.14 kg) BMI:     22.83 Temp:     97.4 degrees F (36.33 degrees C) oral Pulse rate:   116 / minute BP sitting:   105 / 73  (left arm) Cuff size:   regular  Vitals Entered By: Cynda Familia Duncan Dull) (October 10, 2010 3:30 PM) CC: HFU- Is Patient Diabetic? No Pain Assessment Patient in pain? no      Nutritional Status BMI of 19 -24 = normal  Have you ever been in a relationship where you felt threatened, hurt or afraid?No   Does patient need assistance? Functional Status Self care Ambulation Normal    Primary Care Yarel Rushlow:  Nelda Bucks DO  CC:  HFU-.  History of Present Illness: Pt is a 55 y/o F with PMH outlined in the EMR who presents today for HFU.  Pt is diong very well since hospital d/c with no further episodes of diarrhea or abdominal pain.  Pt reports normal BMs since d/c; no BRBPR or dark tarry stool.  She has been taking Creon as directed for pancreatitic insufficiency 2/2 chronic pancreatitis.  DM2: pts A1c is at goal.  She requests change from Novolog (insulin aspart) vials to KwikPen (insulin lispro) for her SSI and mealtime coverage.  She reports the staff at her ALF occasionally have difficulty administering her insulin and she believed the pen would make things easier for the staff and herself.    Chronic pain 2/2 PVD: review of pts MAR reveal she is receiving both Ibuprofen and Mobic in addition to her opiates.  She denies any symptoms of reflux, heartburn, dark tarry stools, epigastric pain, or other symptoms concerning for gastric irritation/ulcer formation.  She is taking Prilosec.  HTN: this is listed in pts problem list but I cannot find any evidence of medical tx for HTN.  Review of BP measurement hx from 2008 to present does not support dx of HTN;  will remove this from pts problem list.            Depression History:      The patient denies a depressed mood most of the day and a diminished interest in her usual daily activities.  The patient denies significant weight loss, significant weight gain, insomnia, and hypersomnia.  The patient denies symptoms of a manic disorder including persistently & abnormally elevated mood and abnormally & persistently irritable mood.         Preventive Screening-Counseling & Management  Alcohol-Tobacco     Alcohol drinks/day: 0     Smoking Status: current     Smoking Cessation Counseling: yes     Packs/Day: <0.25     Passive Smoke Exposure: yes  Current Medications (verified): 1)  Lantus 100 Unit/ml  Soln (Insulin Glargine) .... 20 Units A Day 2)  Humalog Kwikpen 100 Unit/ml Soln (Insulin Lispro (Human)) .... Inject 5 Units Three Times A Day With Meals and Sliding Scale 3)  Colace 100 Mg  Caps (Docusate Sodium) .... Take 1 Tablet By Mouth Once A Day 4)  Atripla 600-200-300 Mg  Tabs (Efavirenz-Emtricitab-Tenofovir) .... Take 1 Tablet By Mouth At Bedtime 5)  Trazodone Hcl 100 Mg Tabs (Trazodone Hcl) .... Take 1 Tablet By Mouth At Bedtime 6)  Cetirizine Hcl 10 Mg Tabs (  Cetirizine Hcl) .... Take 1 Tablet By Mouth At Bedtime 7)  Prilosec 20 Mg Cpdr (Omeprazole) .... Take 1 Tablet By Mouth Once A Day 8)  Lyrica 100 Mg Caps (Pregabalin) .... Take 1 Capsule By Mouth Three Times A Day 9)  Miralax  Powd (Polyethylene Glycol 3350) .... As Directed 10)  Desipramine Hcl 25 Mg Tabs (Desipramine Hcl) .... Take 1 Tablet By Mouth At Bedtime 11)  Cymbalta 30 Mg Cpep (Duloxetine Hcl) .... Take 1 Tablet By Mouth Once A Day 12)  Oxycontin 30 Mg Xr12h-Tab (Oxycodone Hcl) .... Take 1 Tablet By Mouth Two Times A Day 13)  Oxycodone Hcl 5 Mg Tabs (Oxycodone Hcl) .... Take 1 Tablet By Mouth Every 6 Hours As Needed 14)  Cymbalta 60 Mg Cpep (Duloxetine Hcl) .... Take One Capsule One Time A Day 15)  Ferrous Sulfate  325 (65 Fe) Mg Tabs (Ferrous Sulfate) .... Take One Tablet One Time A Day 16)  Aspirin Ec 81 Mg Tbec (Aspirin) .... Take One Tab By Mouth Daily 17)  Mobic 7.5 Mg Tabs (Meloxicam) .... Take 1 Tablet By Mouth Once A Day 18)  Promethazine Hcl 25 Mg Tabs (Promethazine Hcl) .... Take 1 Tablet By Mouth Once A Day 19)  Creon 6000 Unit Cpep (Pancrelipase (Lip-Prot-Amyl)) .... Take 2 Tablets By Mouth With Meals and One Tablet With Snacks 20)  Pen Needles 31g X 8 Mm Misc (Insulin Pen Needle) .... Use To Inject Mealtime and Ssi  Allergies (verified): No Known Drug Allergies  Past History:  Past medical, surgical, family and social histories (including risk factors) reviewed, and any changes noted.  Past Medical History: Depression diabetes Neuropathy  Family History: Reviewed history from 12/04/2009 and no changes required. noncontributory  Social History: Reviewed history from 12/04/2009 and no changes required. Alcohol use- denies currenlty,  Hx of cocaine use- pt denies current illicit drug use lives in ALF  Physical Exam  General:  Vital signs reviewed and noted. Well-developed, well-nourished, in no acute distress; alert,appropriate and cooperative throughout examination. Head: normocephalic, atraumatic. Neck: No deformities, masses, or tenderness noted. Lungs: Normal respiratory effort. Clear to auscultation BL without crackles or wheezes.  Heart: RRR. S1 and S2 normal without gallop, murmur, or rubs.  Abdomen: BS normoactive. Soft, Nondistended, non-tender.  No masses or organomegaly. Extremities: No clubbing, cyanosis, or edema.  Neuro: no focal deficit.  Diabetes Management Exam:    Foot Exam (with socks and/or shoes not present):       Sensory-Monofilament:          Left foot: normal          Right foot: normal   Impression & Recommendations:  Problem # 1:  CHRONIC PANCREATITIS (ICD-577.1) Pts symptoms of abdominal pain and diarrhea  related to pancreatic  insufficiency 2/2 chronic pancreatitis are resolved and well controlled with Creon.  Advised pt to continue taking Creon as directed and informed her that she will likely need to continue this medication indefinitely.  Problem # 2:  IDDM (ICD-250.01) Pts DM is very well controlled on her current regimen.  I agree that KwikPen will allow for easier administration of SSI and meal time insulin coverage and do not see any contraindications to change from insulin aspart to insulin lispro.  Will send new rx in to pharmacy today and wrote order for ALF regarding change to new insulin with instructions for dosing/administration.  Will continue Lantus at 20u at bedtime.  WIll f/u repeat labs in 3 months.  Annual foot exam performed  today.    Her updated medication list for this problem includes:    Lantus 100 Unit/ml Soln (Insulin glargine) .Marland Kitchen... 20 units a day    Humalog Kwikpen 100 Unit/ml Soln (Insulin lispro (human)) ..... Inject 5 units three times a day with meals and sliding scale    Aspirin Ec 81 Mg Tbec (Aspirin) .Marland Kitchen... Take one tab by mouth daily  Labs Reviewed: Creat: 0.92 (10/07/2010)    Reviewed HgBA1c results: 6.8 (10/07/2010)  7.6 (02/25/2010)  Problem # 3:  OTHER CHRONIC PAIN (ICD-338.29) Pt has chronic pain as a result of her PVD and claudication.  Review of EMR records and MAR provided by ALF reveal that pt is receiving both ibuprofen and mobic.  This places pt at increase risk for adverse events from NSAID use.  Will d/c ibuprofen and continue mobic.  Will also continue prilosec.  WIll need to clarify continued managment of pts narcotic medications by [facility] vs [facility].    Complete Medication List: 1)  Lantus 100 Unit/ml Soln (Insulin glargine) .... 20 units a day 2)  Humalog Kwikpen 100 Unit/ml Soln (Insulin lispro (human)) .... Inject 5 units three times a day with meals and sliding scale 3)  Colace 100 Mg Caps (Docusate sodium) .... Take 1 tablet by mouth once a day 4)  Atripla  600-200-300 Mg Tabs (Efavirenz-emtricitab-tenofovir) .... Take 1 tablet by mouth at bedtime 5)  Trazodone Hcl 100 Mg Tabs (Trazodone hcl) .... Take 1 tablet by mouth at bedtime 6)  Cetirizine Hcl 10 Mg Tabs (Cetirizine hcl) .... Take 1 tablet by mouth at bedtime 7)  Prilosec 20 Mg Cpdr (Omeprazole) .... Take 1 tablet by mouth once a day 8)  Lyrica 100 Mg Caps (Pregabalin) .... Take 1 capsule by mouth three times a day 9)  Miralax Powd (Polyethylene glycol 3350) .... As directed 10)  Desipramine Hcl 25 Mg Tabs (Desipramine hcl) .... Take 1 tablet by mouth at bedtime 11)  Cymbalta 30 Mg Cpep (Duloxetine hcl) .... Take 1 tablet by mouth once a day 12)  Oxycontin 30 Mg Xr12h-tab (Oxycodone hcl) .... Take 1 tablet by mouth two times a day 13)  Oxycodone Hcl 5 Mg Tabs (Oxycodone hcl) .... Take 1 tablet by mouth every 6 hours as needed 14)  Cymbalta 60 Mg Cpep (Duloxetine hcl) .... Take one capsule one time a day 15)  Ferrous Sulfate 325 (65 Fe) Mg Tabs (Ferrous sulfate) .... Take one tablet one time a day 16)  Aspirin Ec 81 Mg Tbec (Aspirin) .... Take one tab by mouth daily 17)  Mobic 7.5 Mg Tabs (Meloxicam) .... Take 1 tablet by mouth once a day 18)  Promethazine Hcl 25 Mg Tabs (Promethazine hcl) .... Take 1 tablet by mouth once a day 19)  Creon 6000 Unit Cpep (Pancrelipase (lip-prot-amyl)) .... Take 2 tablets by mouth with meals and one tablet with snacks 20)  Pen Needles 31g X 8 Mm Misc (Insulin pen needle) .... Use to inject mealtime and ssi  Patient Instructions: 1)  Please schedule a follow-up appointment in 3 months with Dr. Arvilla Market. 2)  Please call the clinic at [phone] with any questions or concerns. Prescriptions: PEN NEEDLES 31G X 8 MM MISC (INSULIN PEN NEEDLE) Use to inject mealtime and SSI  #1 box x 12   Entered and Authorized by:   Nelda Bucks DO   Signed by:   Nelda Bucks DO on 10/10/2010   Method used:   Faxed to ...       [facility] (retail)             , [place]         Ph:          Fax: [phone]   RxID:   [id number] HUMALOG KWIKPEN 100 UNIT/ML SOLN (INSULIN LISPRO (HUMAN)) inject 5 units three times a day with meals and sliding scale  #1 box x 12   Entered and Authorized by:   Nelda Bucks DO   Signed by:   Nelda Bucks DO on 10/10/2010   Method used:   Faxed to ...       [facility] (retail)             , [place]         Ph:        Fax: [phone]   RxID:   [id number]    Orders Added: 1)  Est. Patient Level III [64403]     Prevention & Chronic Care Immunizations   Influenza vaccine: Historical  (09/06/2010)    Tetanus booster: Not documented    Pneumococcal vaccine: Pneumovax  (03/21/2008)  Colorectal Screening   Hemoccult: Not documented    Colonoscopy: Not documented  Other Screening   Pap smear: NEGATIVE FOR INTRAEPITHELIAL LESIONS OR MALIGNANCY.  (12/04/2009)    Mammogram: BI-RADS CATEGORY 2:  Benign finding(s).^MM DIGITAL DIAG LTD L  (01/22/2009)   Smoking status: current  (10/10/2010)   Smoking cessation counseling: yes  (10/10/2010)  Diabetes Mellitus   HgbA1C: 6.8  (10/07/2010)    Eye exam: Not documented    Foot exam: yes  (10/10/2010)   Foot exam action/deferral: Do today   High risk foot: No  (10/10/2010)   Foot care education: Done  (10/10/2010)    Urine microalbumin/creatinine ratio: Not documented    Diabetes flowsheet reviewed?: Yes   Progress toward A1C goal: At goal  Lipids   Total Cholesterol: 113  (08/14/2009)   Lipid panel action/deferral: Deferred   LDL: 51  (08/14/2009)   LDL Direct: Not documented   HDL: 38  (08/14/2009)   Triglycerides: 119  (08/14/2009)  Self-Management Support :    Patient will work on the following items until the next clinic visit to reach self-care goals:     Medications and monitoring: take my medicines every day, examine my feet every day  (10/10/2010)     Eating: eat foods that are low in salt, eat baked foods instead of fried foods  (10/10/2010)     Diabetes self-management support: Written self-care plan  (10/10/2010)   Diabetes care plan printed   Nursing Instructions: Diabetic foot exam today    Diabetic Foot Exam Foot Inspection Is there a history of a foot ulcer?              No Is there a foot ulcer now?              No Are the shoes appropriate in style and fit?          Yes Is there swelling or an abnormal foot shape?          No Are the toenails long?                No Are the toenails thick?                Yes Are the toenails ingrown?              No Is there heavy callous build-up?  No Is there pain in the calf muscle (Intermittent claudication) when walking?    YesIs there a claw toe deformity?              No Is there elevated skin temperature?            No Is there limited ankle dorsiflexion?            No Is there foot or ankle muscle weakness?            No  Diabetic Foot Care Education Patient educated on appropriate care of diabetic feet.  Pulse Check          Right Foot          Left Foot Posterior Tibial:        normal            normal Dorsalis Pedis:        normal            normal  High Risk Feet? No   10-g (5.07) Semmes-Weinstein Monofilament Test Performed by: Lynn Ito          Right Foot          Left Foot Site 1         normal         normal Site 2         normal         normal Site 3         normal         normal Site 4         normal         normal Site 5         normal         normal Site 6         normal         normal  Impression      normal         normal

## 2011-01-08 ENCOUNTER — Ambulatory Visit: Payer: Self-pay | Admitting: Internal Medicine

## 2011-01-08 ENCOUNTER — Encounter: Payer: Self-pay | Admitting: Adult Health

## 2011-01-08 ENCOUNTER — Ambulatory Visit (INDEPENDENT_AMBULATORY_CARE_PROVIDER_SITE_OTHER): Payer: Medicaid Other | Admitting: Adult Health

## 2011-01-08 ENCOUNTER — Ambulatory Visit: Admit: 2011-01-08 | Payer: Self-pay | Admitting: Internal Medicine

## 2011-01-08 DIAGNOSIS — K861 Other chronic pancreatitis: Secondary | ICD-10-CM

## 2011-01-08 DIAGNOSIS — G8929 Other chronic pain: Secondary | ICD-10-CM

## 2011-01-08 DIAGNOSIS — E109 Type 1 diabetes mellitus without complications: Secondary | ICD-10-CM

## 2011-01-08 DIAGNOSIS — B20 Human immunodeficiency virus [HIV] disease: Secondary | ICD-10-CM

## 2011-01-08 LAB — CONVERTED CEMR LAB: Hgb A1c MFr Bld: 9.5 % — ABNORMAL HIGH (ref <5.7)

## 2011-01-08 NOTE — Miscellaneous (Signed)
Summary: [name]   Imported By: Margie Billet 01/02/2011 16:26:27  _____________________________________________________________________  External Attachment:    Type:   Image     Comment:   External Document

## 2011-01-08 NOTE — Miscellaneous (Signed)
  Clinical Lists Changes  Orders: Added new Test order of T-CBC w/Diff [phone]) - Signed Added new Test order of T-CD4SP [facility]) (CD4SP) - Signed Added new Test order of T-Comprehensive Metabolic Panel [phone]) - Signed Added new Test order of T-HIV Viral Load [phone]) - Signed     Process Orders Check Orders Results:     [facility]: ABN not required for this insurance Tests Sent for requisitioning (December 25, 2010 2:23 PM):     12/25/2010: [facility] -- T-CBC w/Diff [[id number]] (signed)     12/25/2010: [facility] -- T-Comprehensive Metabolic Panel [[id number]] (signed)     12/25/2010: [facility] -- T-HIV Viral Load [phone] (signed)

## 2011-01-08 NOTE — Assessment & Plan Note (Signed)
Summary: ACUTE-TOE PROBLEMS/CFB   Vital Signs:  Patient profile:   55 year old female Menstrual status:  postmenopausal Height:      65 inches (165.10 cm) Weight:      134.3 pounds (62.14 kg) BMI:     22.83 Temp:     97.7 degrees F (36.50 degrees C) oral Pulse rate:   120 / minute BP sitting:   117 / 81  (left arm) Cuff size:   regular  Vitals Entered By: Theotis Barrio NT II (December 18, 2010 3:23 PM) CC: PATIENT IS HERE BECAUSE RIGHT GREAT TOE NAIL CAME OFF/ DOOR ACCIDENTLY CLOSED ON TOE Is Patient Diabetic? Yes Did you bring your meter with you today? No Pain Assessment Patient in pain? yes      Onset of pain  NO Nutritional Status BMI of 19 -24 = normal  Have you ever been in a relationship where you felt threatened, hurt or afraid?No   Does patient need assistance? Functional Status Self care Ambulation Normal   Primary Care Provider:  Nelda Bucks DO  CC:  PATIENT IS HERE BECAUSE RIGHT GREAT TOE NAIL CAME OFF/ DOOR ACCIDENTLY CLOSED ON TOE.  History of Present Illness: 1. questions "on diet." 2. patient reports injuring her left big toe by slamming against the door 1 month ago. Was evaluated in [facility] --no frx per patient's report. Patient states that her "toe nail fell off yesterday and she worries that it would not grow back." otherwise denies any symptoms.  Preventive Screening-Counseling & Management  Alcohol-Tobacco     Alcohol drinks/day: 0     Smoking Status: current     Smoking Cessation Counseling: yes     Packs/Day: <0.25     Passive Smoke Exposure: yes  Caffeine-Diet-Exercise     Does Patient Exercise: yes     Type of exercise: WALKS SHORT DISTANCE  Current Problems (verified): 1)  Special Screening For Malignant Neoplasms Colon  (ICD-V76.51) 2)  Other Chronic Pain  (ICD-338.29) 3)  Chronic Pancreatitis  (ICD-577.1) 4)  Thoracic/lumbosacral Neuritis/radiculitis Unspec  (ICD-724.4) 5)  Gerd  (ICD-530.81) 6)  Pvd With  Claudication  (ICD-443.89) 7)  Unspecified Pruritic Disorder  (ICD-698.9) 8)  Alcohol Abuse, Hx of  (ICD-V11.3) 9)  Cocaine Abuse, Hx of  (ICD-V15.9) 10)  Depression  (ICD-311) 11)  Screening For Malignant Neoplasm of The Cervix  (ICD-V76.2) 12)  Hepatitis C  (ICD-070.51) 13)  Iddm  (ICD-250.01) 14)  HIV Infection  (ICD-042)  Current Medications (verified): 1)  Lantus 100 Unit/ml  Soln (Insulin Glargine) .... 20 Units A Day 2)  Humalog Kwikpen 100 Unit/ml Soln (Insulin Lispro (Human)) .... Inject 5 Units Three Times A Day With Meals and Sliding Scale 3)  Colace 100 Mg  Caps (Docusate Sodium) .... Take 1 Tablet By Mouth Once A Day 4)  Atripla 600-200-300 Mg  Tabs (Efavirenz-Emtricitab-Tenofovir) .... Take 1 Tablet By Mouth At Bedtime 5)  Trazodone Hcl 100 Mg Tabs (Trazodone Hcl) .... Take 1 Tablet By Mouth At Bedtime 6)  Cetirizine Hcl 10 Mg Tabs (Cetirizine Hcl) .... Take 1 Tablet By Mouth At Bedtime 7)  Prilosec 20 Mg Cpdr (Omeprazole) .... Take 1 Tablet By Mouth Once A Day 8)  Lyrica 100 Mg Caps (Pregabalin) .... Take 1 Capsule By Mouth Three Times A Day 9)  Miralax  Powd (Polyethylene Glycol 3350) .... As Directed 10)  Desipramine Hcl 25 Mg Tabs (Desipramine Hcl) .... Take 1 Tablet By Mouth At Bedtime 11)  Cymbalta 30 Mg Cpep (  Duloxetine Hcl) .... Take 1 Tablet By Mouth Once A Day 12)  Oxycontin 30 Mg Xr12h-Tab (Oxycodone Hcl) .... Take 1 Tablet By Mouth Two Times A Day 13)  Oxycodone Hcl 5 Mg Tabs (Oxycodone Hcl) .... Take 1 Tablet By Mouth Every 6 Hours As Needed 14)  Cymbalta 60 Mg Cpep (Duloxetine Hcl) .... Take One Capsule One Time A Day 15)  Ferrous Sulfate 325 (65 Fe) Mg Tabs (Ferrous Sulfate) .... Take One Tablet One Time A Day 16)  Aspirin Ec 81 Mg Tbec (Aspirin) .... Take One Tab By Mouth Daily 17)  Mobic 7.5 Mg Tabs (Meloxicam) .... Take 1 Tablet By Mouth Once A Day 18)  Promethazine Hcl 25 Mg Tabs (Promethazine Hcl) .... Take 1 Tablet By Mouth Once A Day 19)  Creon 6000  Unit Cpep (Pancrelipase (Lip-Prot-Amyl)) .... Take 2 Tablets By Mouth With Meals and One Tablet With Snacks 20)  Pen Needles 31g X 8 Mm Misc (Insulin Pen Needle) .... Use To Inject Mealtime and Ssi  Allergies (verified): No Known Drug Allergies  Past History:  Past medical, surgical, family and social histories (including risk factors) reviewed, and no changes noted (except as noted below).  Past Medical History: Reviewed history from 10/10/2010 and no changes required. Depression diabetes Neuropathy  Family History: Reviewed history from 12/04/2009 and no changes required. noncontributory  Social History: Reviewed history from 10/10/2010 and no changes required. Alcohol use- denies currenlty,  Hx of cocaine use- pt denies current illicit drug use lives in ALF Does Patient Exercise:  yes  Physical Exam  General:  Vital signs reviewed and noted. Well-developed, well-nourished, in no acute distress; alert,appropriate and cooperative throughout examination. Head: normocephalic, atraumatic. Neck: No deformities, masses, or tenderness noted. Lungs: Normal respiratory effort. Clear to auscultation BL without crackles or wheezes.  Heart: RRR. S1 and S2 normal without gallop, murmur, or rubs.  Abdomen: BS normoactive. Soft, Nondistended, non-tender.  No masses or organomegaly. Extremities: No clubbing, cyanosis, or edema.  Neuro: no focal deficit. Head:  atraumatic and no abnormalities observed.  atraumatic and no abnormalities observed.   Eyes:  vision grossly intact.  vision grossly intact.   Mouth:  no gingival abnormalities, no erythema, no exudates, and teeth missing.  no gingival abnormalities, no erythema, and no exudates.   Neck:  No deformities, masses, or tenderness noted. Lungs:  Normal respiratory effort, chest expands symmetrically. Lungs are clear to auscultation, no crackles or wheezes. Heart:  Normal rate and regular rhythm. S1 and S2 normal without gallop, murmur,  click, rub or other extra sounds. Abdomen:  Bowel sounds positive,abdomen soft and non-tender without masses, organomegaly or hernias noted. Msk:  No deformity or scoliosis noted of thoracic or lumbar spine.  Left big toe with a nail avulsion due to a recent trauma with a door. No signs of infecvection or TTP. Pulses:  R and L carotid,radial,femoral,dorsalis pedis and posterior tibial pulses are full and equal bilaterally Extremities:  No clubbing, cyanosis, edema, or deformity noted with normal full range of motion of all joints.   Neurologic:  No cranial nerve deficits noted. Station and gait are normal. Plantar reflexes are down-going bilaterally. DTRs are symmetrical throughout. Sensory, motor and coordinative functions appear intact. Skin:  Intact without suspicious lesions or rashes Psych:  Cognition and judgment appear intact. Alert and cooperative with normal attention span and concentration. No apparent delusions, illusions, hallucinations  Diabetes Management Exam:    Foot Exam (with socks and/or shoes not present):  Sensory-Pinprick/Light touch:          Left medial foot (L-4): normal          Left dorsal foot (L-5): normal          Left lateral foot (S-1): normal          Right medial foot (L-4): normal          Right dorsal foot (L-5): normal          Right lateral foot (S-1): normal       Sensory-Monofilament:          Left foot: normal          Right foot: normal       Inspection:          Left foot: normal          Right foot: normal       Nails:          Left foot: normal          Right foot: normal    Foot Exam by Podiatrist:       Date: 12/18/2010       Results: no diabetic findings       Done by: Denton Meek   Impression & Recommendations:  Problem # 1:  IDDM (ICD-250.01) Assessment Unchanged Foot care, diet, exercise and Dm discussed with the patient. Patient is referred to Ms. Victory Dakin for a further discussion on nutrition. Her updated medication list for  this problem includes:    Lantus 100 Unit/ml Soln (Insulin glargine) .Marland Kitchen... 20 units a day    Humalog Kwikpen 100 Unit/ml Soln (Insulin lispro (human)) ..... Inject 5 units three times a day with meals and sliding scale    Aspirin Ec 81 Mg Tbec (Aspirin) .Marland Kitchen... Take one tab by mouth daily  Orders: Ophthalmology Referral (Ophthalmology) T-Urine Microalbumin w/creat. ratio [phone]) T-Lipid Profile [phone]) DME Referral (DME)  Labs Reviewed: Creat: 0.92 (10/07/2010)    Reviewed HgBA1c results: 6.8 (10/07/2010)  7.6 (02/25/2010)  Her updated medication list for this problem includes:    Lantus 100 Unit/ml Soln (Insulin glargine) .Marland Kitchen... 20 units a day    Humalog Kwikpen 100 Unit/ml Soln (Insulin lispro (human)) ..... Inject 5 units three times a day with meals and sliding scale    Aspirin Ec 81 Mg Tbec (Aspirin) .Marland Kitchen... Take one tab by mouth daily  Problem # 2:  CRUSHING INJURY, TOE (ICD-928.3) Assessment: New Toe is NTTP and without any signs of infection. foot care discussed. No further work up or treatment is indicated.  Complete Medication List: 1)  Lantus 100 Unit/ml Soln (Insulin glargine) .... 20 units a day 2)  Humalog Kwikpen 100 Unit/ml Soln (Insulin lispro (human)) .... Inject 5 units three times a day with meals and sliding scale 3)  Colace 100 Mg Caps (Docusate sodium) .... Take 1 tablet by mouth once a day 4)  Atripla 600-200-300 Mg Tabs (Efavirenz-emtricitab-tenofovir) .... Take 1 tablet by mouth at bedtime 5)  Trazodone Hcl 100 Mg Tabs (Trazodone hcl) .... Take 1 tablet by mouth at bedtime 6)  Cetirizine Hcl 10 Mg Tabs (Cetirizine hcl) .... Take 1 tablet by mouth at bedtime 7)  Prilosec 20 Mg Cpdr (Omeprazole) .... Take 1 tablet by mouth once a day 8)  Lyrica 100 Mg Caps (Pregabalin) .... Take 1 capsule by mouth three times a day 9)  Miralax Powd (Polyethylene glycol 3350) .... As directed 10)  Desipramine Hcl 25 Mg Tabs (Desipramine hcl) .... Take 1  tablet by  mouth at bedtime 11)  Cymbalta 30 Mg Cpep (Duloxetine hcl) .... Take 1 tablet by mouth once a day 12)  Oxycontin 30 Mg Xr12h-tab (Oxycodone hcl) .... Take 1 tablet by mouth two times a day 13)  Oxycodone Hcl 5 Mg Tabs (Oxycodone hcl) .... Take 1 tablet by mouth every 6 hours as needed 14)  Cymbalta 60 Mg Cpep (Duloxetine hcl) .... Take one capsule one time a day 15)  Ferrous Sulfate 325 (65 Fe) Mg Tabs (Ferrous sulfate) .... Take one tablet one time a day 16)  Aspirin Ec 81 Mg Tbec (Aspirin) .... Take one tab by mouth daily 17)  Mobic 7.5 Mg Tabs (Meloxicam) .... Take 1 tablet by mouth once a day 18)  Promethazine Hcl 25 Mg Tabs (Promethazine hcl) .... Take 1 tablet by mouth once a day 19)  Creon 6000 Unit Cpep (Pancrelipase (lip-prot-amyl)) .... Take 2 tablets by mouth with meals and one tablet with snacks 20)  Pen Needles 31g X 8 Mm Misc (Insulin pen needle) .... Use to inject mealtime and ssi  Other Orders: T-Hemoccult Card-Multiple (take home) (62130) Gastroenterology Referral (GI) Mammogram (Screening) (Mammo)  Patient Instructions: 1)  Please, follow up with all your referrals. 2)  Please, make an appointment with Ms. Victory Dakin for a diabetic teaching. 3)  please, call with any questions and follow up in 6 months   Orders Added: 1)  T-Hemoccult Card-Multiple (take home) [82270] 2)  Gastroenterology Referral [GI] 3)  Mammogram (Screening) [Mammo] 4)  Ophthalmology Referral [Ophthalmology] 5)  T-Urine Microalbumin w/creat. ratio [[id number]] 6)  T-Lipid Profile [[id number]] 7)  DME Referral [DME] 8)  Est. Patient Level III [86578]    Prevention & Chronic Care Immunizations   Influenza vaccine: Historical  (09/06/2010)   Influenza vaccine due: 08/07/2012    Tetanus booster: Not documented   Tetanus booster due: 12/18/2020    Pneumococcal vaccine: Pneumovax  (03/21/2008)   Pneumococcal vaccine due: 07/09/2020  Colorectal Screening   Hemoccult: Not documented    Hemoccult action/deferral: Ordered  (12/18/2010)    Colonoscopy: Not documented   Colonoscopy action/deferral: GI referral  (12/18/2010)  Other Screening   Pap smear: NEGATIVE FOR INTRAEPITHELIAL LESIONS OR MALIGNANCY.  (12/04/2009)   Pap smear action/deferral: Deferred-2 yr interval  (12/18/2010)   Pap smear due: 12/18/2012    Mammogram: BI-RADS CATEGORY 2:  Benign finding(s).^MM DIGITAL DIAG LTD L  (01/22/2009)   Mammogram action/deferral: Ordered  (12/18/2010)   Mammogram due: 01/22/2011   Smoking status: current  (12/18/2010)   Smoking cessation counseling: yes  (12/18/2010)  Diabetes Mellitus   HgbA1C: 6.8  (10/07/2010)   Hemoglobin A1C due: 01/07/2011    Eye exam: Not documented   Diabetic eye exam action/deferral: Ophthalmology referral  (12/18/2010)    Foot exam: yes  (12/18/2010)   Foot exam action/deferral: Do today   High risk foot: No  (10/10/2010)   Foot care education: Done  (10/10/2010)   Foot exam due: 10/11/2011    Urine microalbumin/creatinine ratio: Not documented   Urine microalbumin action/deferral: Ordered   Urine microalbumin/cr due: 12/19/2011    Diabetes flowsheet reviewed?: Yes   Progress toward A1C goal: At goal    Stage of readiness to change (diabetes management): Action  Lipids   Total Cholesterol: 113  (08/14/2009)   Lipid panel action/deferral: Lipid Panel ordered   LDL: 51  (08/14/2009)   LDL Direct: Not documented   HDL: 38  (08/14/2009)   Triglycerides: 119  (08/14/2009)   Lipid panel due:  12/19/2011  Self-Management Support :   Personal Goals (by the next clinic visit) :     Personal A1C goal: 7  (12/18/2010)     Personal blood pressure goal: 130/80  (12/18/2010)     Personal LDL goal: 100  (12/18/2010)    Patient will work on the following items until the next clinic visit to reach self-care goals:     Medications and monitoring: take my medicines every day, check my blood sugar  (12/18/2010)     Eating: drink diet soda or  water instead of juice or soda, eat more vegetables, eat foods that are low in salt, eat baked foods instead of fried foods, limit or avoid alcohol  (12/18/2010)     Activity: take a 30 minute walk every day  (12/18/2010)    Diabetes self-management support: Resources for patients handout  (12/18/2010)      Resource handout printed.   Nursing Instructions: Give tetanus booster today Provide Hemoccult cards with instructions (see order) GI referral for screening colonoscopy (see order) Schedule screening mammogram (see order) Refer for screening diabetic eye exam (see order)   Process Orders Check Orders Results:     [facility]: Order checked:     Nelda Bucks DO NOT AUTHORIZED TO ORDER Tests Sent for requisitioning (December 19, 2010 9:38 AM):     12/18/2010: [facility] -- T-Urine Microalbumin w/creat. ratio [[id number]] (signed)     12/18/2010: [facility] -- T-Lipid Profile [phone] (signed)    Process Orders Check Orders Results:     [facility]: Order checked:     Nelda Bucks DO NOT AUTHORIZED TO ORDER Tests Sent for requisitioning (December 19, 2010 9:38 AM):     12/18/2010: [facility] -- T-Urine Microalbumin w/creat. ratio [[id number]] (signed)     12/18/2010: [facility] -- T-Lipid Profile [phone] (signed)

## 2011-01-08 NOTE — Progress Notes (Signed)
  Phone Note Outgoing Call   Call placed by: Theotis Barrio NT II,  January 01, 2011 11:43 AM Call placed to: Patient Details for Reason: EYE EXAM Summary of Call: CALLED AND SPOKE WITH MS [name], WANTED TO KNOW IF SHE WANTED HER EYE APPT TO BE MADE IN [place] OR IN [place]. ASKED HER TO TELL REGGIE (HER AIDE) TO CALL ME AND WE CAN WORK ON GETTING THE APPT MADE.

## 2011-01-09 ENCOUNTER — Telehealth: Payer: Self-pay | Admitting: Adult Health

## 2011-01-09 ENCOUNTER — Encounter: Payer: Self-pay | Admitting: Adult Health

## 2011-01-14 NOTE — Progress Notes (Addendum)
Summary: glucose high  Phone Note Call from Patient   Summary of Call: [facility] called stated that the patient's glucose was 545 and needed someone to call back. Ask for [facility] Initial call taken by: Starleen Arms CMA,  January 09, 2011 11:25 AM  Follow-up for Phone Call        Patient was seen in [facility] 01/08/2011.  Lantus dosing changed to 30U Subcutaneously at bedtime and ac Novolog coverage increased to 10u subcutaneously PLUS 1U for every 30mg  CBG's >100 ac.   Diabetic teaching referral made.  Consult with Endocrinology also requested. Follow-up by: Talmadge Chad NP,  January 09, 2011 12:01 PM

## 2011-01-16 ENCOUNTER — Ambulatory Visit: Payer: Medicaid Other | Admitting: Dietician

## 2011-01-20 ENCOUNTER — Encounter: Payer: Self-pay | Admitting: Internal Medicine

## 2011-01-21 ENCOUNTER — Encounter: Payer: Self-pay | Admitting: Gastroenterology

## 2011-01-28 NOTE — Miscellaneous (Signed)
Summary: [facility] Previsit/prep  Clinical Lists Changes  Medications: Added new medication of MOVIPREP 100 GM  SOLR (PEG-KCL-NACL-NASULF-NA ASC-C) As per prep instructions. - Signed Rx of MOVIPREP 100 GM  SOLR (PEG-KCL-NACL-NASULF-NA ASC-C) As per prep instructions.;  #1 x 0;  Signed;  Entered by: Wyona Almas RN;  Authorized by: Louis Meckel MD;  Method used: Electronically to [facility] [address], [place], [place], [place]  [address], Ph: [phone], Fax: [phone] Observations: Added new observation of NKA: T (01/21/2011 13:23)    Prescriptions: MOVIPREP 100 GM  SOLR (PEG-KCL-NACL-NASULF-NA ASC-C) As per prep instructions.  #1 x 0   Entered by:   Wyona Almas RN   Authorized by:   Louis Meckel MD   Signed by:   Wyona Almas RN on 01/21/2011   Method used:   Electronically to        [facility] (retail)       [address]       [place], [place]  [address]       Ph: [phone]       Fax: [phone]   RxID:   [id number]

## 2011-02-04 ENCOUNTER — Other Ambulatory Visit: Payer: Self-pay | Admitting: Gastroenterology

## 2011-02-05 ENCOUNTER — Telehealth: Payer: Self-pay | Admitting: Adult Health

## 2011-02-12 NOTE — Progress Notes (Signed)
Summary: DM control  Phone Note From Other Clinic   Caller: Nurse Reason for Call: Medication Check Summary of Call: On duty nurse where patient is a resident called advised that patients blood sugar reading was 416. Stated that she had Novolog 10 units before breakfast and also at 12pm and wanted to know what to do at this point with her levels being so high. NP Felizardo Hoffmann nurse to give 10 additiona units of novolog at 1245 and check again soon. He asked if she had kept her endocronology appt and the DM Education referral and the nurse said no because Ms Dupree refused to go and is not interested because she does not want to change her eating habits. NP Sundra Aland advised to tell nurse that the education and endo referral are highly important to this patient and she needs to understand that we sent her to these specialist because she needs what the have to offer her and it will help her control her DM readings. If she refuses the help we can not help get her levels under control which is not healthy for her. Nurse asked if she could make her an appointment with a local [facility]) doctor for the Endo referral advised her to go ahead and if they need anything from [place] to just send a request. Initial call taken by: Alesia Morin CMA,  February 05, 2011 3:15 PM

## 2011-02-12 NOTE — Medication Information (Signed)
Summary: [facility]: RX  [facility]: RX   Imported By: Florinda Marker 02/03/2011 15:01:34  _____________________________________________________________________  External Attachment:    Type:   Image     Comment:   External Document

## 2011-02-12 NOTE — Miscellaneous (Signed)
Summary: [facility]: Physicians Orders  [facility]: Physicians Orders   Imported By: Florinda Marker 02/03/2011 14:59:43  _____________________________________________________________________  External Attachment:    Type:   Image     Comment:   External Document

## 2011-02-16 LAB — GLUCOSE, CAPILLARY: Glucose-Capillary: 297 mg/dL — ABNORMAL HIGH (ref 70–99)

## 2011-02-17 LAB — GLUCOSE, CAPILLARY: Glucose-Capillary: 279 mg/dL — ABNORMAL HIGH (ref 70–99)

## 2011-02-18 ENCOUNTER — Encounter: Payer: Self-pay | Admitting: Dietician

## 2011-02-18 ENCOUNTER — Ambulatory Visit (INDEPENDENT_AMBULATORY_CARE_PROVIDER_SITE_OTHER): Payer: Medicaid Other | Admitting: Dietician

## 2011-02-18 ENCOUNTER — Other Ambulatory Visit: Payer: Self-pay | Admitting: Dietician

## 2011-02-18 DIAGNOSIS — E119 Type 2 diabetes mellitus without complications: Secondary | ICD-10-CM

## 2011-02-18 LAB — GLUCOSE, CAPILLARY
Glucose-Capillary: 149 mg/dL — ABNORMAL HIGH (ref 70–99)
Glucose-Capillary: 163 mg/dL — ABNORMAL HIGH (ref 70–99)
Glucose-Capillary: 171 mg/dL — ABNORMAL HIGH (ref 70–99)
Glucose-Capillary: 175 mg/dL — ABNORMAL HIGH (ref 70–99)
Glucose-Capillary: 192 mg/dL — ABNORMAL HIGH (ref 70–99)
Glucose-Capillary: 223 mg/dL — ABNORMAL HIGH (ref 70–99)
Glucose-Capillary: 254 mg/dL — ABNORMAL HIGH (ref 70–99)
Glucose-Capillary: 74 mg/dL (ref 70–99)
Glucose-Capillary: 78 mg/dL (ref 70–99)

## 2011-02-18 LAB — TSH: TSH: 2.196 u[IU]/mL (ref 0.350–4.500)

## 2011-02-18 MED ORDER — GLUCOSE BLOOD VI STRP: ORAL_STRIP | Status: DC

## 2011-02-18 MED ORDER — ACCU-CHEK SOFTCLIX LANCET DEV MISC: Status: DC

## 2011-02-18 MED ORDER — INSULIN LISPRO 100 UNIT/ML ~~LOC~~ SOLN: SUBCUTANEOUS | Status: DC

## 2011-02-18 NOTE — Patient Instructions (Signed)
Your legs might feel better and your good cholesterol might be better if you:  Stop smoking- try decreasing by Exercise daily walk half the building each day  To improve your blood sugars: try to eat about the same amount of carbohydrate at each meal  Breakfast: 3 portions= 45 grams carbohydrate- see charts provided Lunch: = 3 portions = 45 grams carbohydrate Supper= 3 portions= 45 grams carbohydrate  Can sub ensure or other shake for carbs not eaten in meal 1% milk is fine to drink with meals instead of shakes- would use shakes when not eating well or anytime she wants one can sub for other carb foods  Please carry meter and treatment for low blood sugar- and know symptoms and please review with Jeanene periodically.- see sheet provided today- hunger is a symptom of low blood sugar  Treatment for low blood sugars symptoms - please check blood sugar , treat if lows with 15 grams carbohydrate, recheck in 15 minutes, retreat if neededc

## 2011-02-18 NOTE — Progress Notes (Signed)
Diabetes Self-Management Training (DSMT)  Initial Visit  02/18/2011 Ms. [employer], identified by name and date of birth, is a 55 y.o. female with Type 1 Diabetes. Year of diabetes diagnosis: 2005 Other persons present: -leone from [facility]  ASSESSMENT Patient concerns are Monitoring and Glycemic control.  Height 5\' 4"  (1.626 m), weight 142 lb (64.411 kg). Body mass index is 24.37 kg/(m^2). Lab Results  Component Value Date   LDLCALC 51 12/18/2010   Lab Results  Component Value Date   HGBA1C 9.5* 01/08/2011   Medication Nutrition Monitor: received accu-chek comact plus today with training- results was 157 at 3 pm and 177 at 4 pm with 2 different meters  Labs reviewed.  DIABETES BUNDLE: A1C in past 6 months? Yes.  Less than 7%? No LDL in past year? Yes.  Less than 100 mg/dL? Yes Microalbumin ratio in past year? Yes. Blood pressure less than 130/80? Yes. Foot exam in last year? Yes. Eye exam in past year? No.  Reminded patient to schedule eye exam. Tobacco use? Yes.  Smoking cessation offered? Yes Pneumovax? Yes Flu vaccine? Yes Asprin? Yes  Family history of diabetes: No  Support systems: assisted living  Special needs: Simplified materials, Verbal instruction, Instruct caregiver  Prior DM Education: Yes   Medications See Medications list.  Not taking as prescribed and Needs skills/knowledge review  Patients belief/attitude about diabetes: Diabetes can be controlled.  Self foot exams daily: Yes  Diabetes Complications: Neuropathy   Exercise Plan Doing ADLs and does not walk much because she has pain in calves with exercise that is relieved by rest for 5 minutesa day.   Self-Monitoring Frequency of testing: 4 times/day Breakfast: 85- 248 Lunch:187-416 Supper: 109-452 Bedtime: 135-435  Hyperglycemia: Yes Daily Hypoglycemia: No   Meal Planning Limited knowledge and Interested in improving   Assessment comments: It appears that  possibly assisted living is giving patient her correction insulin but not her meal time insulin. Suspect patient needs less basal insulin, but with no reports of hypoglycemia, lipid profile consistent with Insulin resistance and two back to back blood sugars in clinic today around the same value, she may be needing ~ 1 unit/kg of insulin now. Weight increased 10 # in past month- patient stated she prefers to weigh 165, but agreed to try to maintain current weight after our discussion today.     INDIVIDUAL DIABETES EDUCATION PLAN:  Nutrition management Medical Nutrition Therapy Physical activity and exercise Medication Monitoring Acute complication: _______________________________________________________________________  Intervention TOPICS COVERED TODAY:  Nutrition management  Role of diet in the treatment of diabetes and the relationship between the three main macronutritents and blood glucose control. carb consistency stressed today and taught to [facility] with printout provided and new diet order to AL to reflect the same Medical Nutrition Therapy Physical activity and exercise  Role of exercise on diabetes management, blood pressure control and cardiac health. Helped patient identify appropriate exercises in relation to his/her diabetes, diabetes complications and other health issue. Medication  assisted with adopting currfent insulin dose to patient nutrition needs and desires Monitoring  Taught/evaluated SMBG with accucheck compact plus meter. Acute complication  Taught treatment of hypoglycemia - the 15 rule.  PATIENTS GOALS/PLAN (copy and paste in patient instructions so patient receives a copy): 1.  Learning Objective:       See patient instructions 2.  Behavioral Objective:         Nutrition: To improve blood glucose control I will follow meal plan of 3 carb portions  a meal and < 1 for snacks Never 0% Physical Activity: For improved blood glucose control and decrease insulin  resistance, I will exercise daily walk halfway around the building days a week  Never 0% Monitoring: To identify blood glucose trends, I will test my blood glucose check blood sugar when I have signs or symptoms of low blood sugar day and  Never 0%  Personalized Follow-Up Plan for Ongoing Self Management Support:  Doctor's Office, CDE visits and Staff at AL ______________________________________________________________________   Outcomes Expected outcomes: Demonstrated interest in learning.Expect positive changes in lifestyle.  Self-care Barriers: Debilitated state due to current medical condition, Lack of transportation  Education material provided: carb counting booklet, poster and 2 simple sheets with pcitures of carb portions  Patient to contact team via Phone if problems or questions.  Time in: 1400     Time out: 1600  Future DSMT - 4-6 wks   RILEY,DONNA

## 2011-02-18 NOTE — Telephone Encounter (Signed)
Discussed with [facility] Edelson staff importance of always giving mealtime insulin when patient eating and to try to encourage patient to try to eat ~ 3 portions per meal and not to restrict her food choices if possible. Then explained correction scale for mealtime and jackie again verbalized understanding. CDE phone number provided as resource to home. Annice Pih verbalized understanding.  Patient also needs rx for meter supplies signed.

## 2011-02-19 ENCOUNTER — Other Ambulatory Visit: Payer: Self-pay | Admitting: *Deleted

## 2011-02-19 LAB — DIFFERENTIAL
Basophils Relative: 1 % (ref 0–1)
Basophils Relative: 1 % (ref 0–1)
Eosinophils Absolute: 0.1 10*3/uL (ref 0.0–0.7)
Eosinophils Absolute: 0.1 10*3/uL (ref 0.0–0.7)
Lymphs Abs: 1.8 10*3/uL (ref 0.7–4.0)
Monocytes Absolute: 0.5 10*3/uL (ref 0.1–1.0)
Monocytes Relative: 10 % (ref 3–12)
Monocytes Relative: 12 % (ref 3–12)
Neutro Abs: 2.9 10*3/uL (ref 1.7–7.7)
Neutrophils Relative %: 52 % (ref 43–77)
Neutrophils Relative %: 61 % (ref 43–77)

## 2011-02-19 LAB — CULTURE, BLOOD (ROUTINE X 2)
Culture  Setup Time: 201109292132
Culture: NO GROWTH

## 2011-02-19 LAB — BASIC METABOLIC PANEL
BUN: 2 mg/dL — ABNORMAL LOW (ref 6–23)
Calcium: 8 mg/dL — ABNORMAL LOW (ref 8.4–10.5)
Creatinine, Ser: 0.63 mg/dL (ref 0.4–1.2)
GFR calc non Af Amer: >60 mL/min (ref >60)

## 2011-02-19 LAB — URINALYSIS, ROUTINE W REFLEX MICROSCOPIC
Bilirubin Urine: NEGATIVE
Bilirubin Urine: NEGATIVE
Glucose, UA: NEGATIVE mg/dL
Hgb urine dipstick: NEGATIVE
Ketones, ur: NEGATIVE mg/dL
Ketones, ur: NEGATIVE mg/dL
Nitrite: POSITIVE — AB
Protein, ur: NEGATIVE mg/dL
Urobilinogen, UA: 1 mg/dL (ref 0.0–1.0)

## 2011-02-19 LAB — TROPONIN I: Troponin I: 0.02 ng/mL (ref 0.00–0.06)

## 2011-02-19 LAB — COMPREHENSIVE METABOLIC PANEL
ALT: 31 U/L (ref 0–35)
Albumin: 2.5 g/dL — ABNORMAL LOW (ref 3.5–5.2)
Alkaline Phosphatase: 91 U/L (ref 39–117)
BUN: 4 mg/dL — ABNORMAL LOW (ref 6–23)
BUN: 8 mg/dL (ref 6–23)
CO2: 26 mEq/L (ref 19–32)
CO2: 27 mEq/L (ref 19–32)
Calcium: 8.3 mg/dL — ABNORMAL LOW (ref 8.4–10.5)
Chloride: 105 mEq/L (ref 96–112)
GFR calc non Af Amer: >60 mL/min (ref >60)
GFR calc non Af Amer: >60 mL/min (ref >60)
Glucose, Bld: 118 mg/dL — ABNORMAL HIGH (ref 70–99)
Glucose, Bld: 194 mg/dL — ABNORMAL HIGH (ref 70–99)
Potassium: 4.2 mEq/L (ref 3.5–5.1)
Sodium: 137 mEq/L (ref 135–145)
Total Bilirubin: 0.6 mg/dL (ref 0.3–1.2)
Total Protein: 6.2 g/dL (ref 6.0–8.3)

## 2011-02-19 LAB — CBC
HCT: 35 % — ABNORMAL LOW (ref 36.0–46.0)
Hemoglobin: 11.8 g/dL — ABNORMAL LOW (ref 12.0–15.0)
Hemoglobin: 11.9 g/dL — ABNORMAL LOW (ref 12.0–15.0)
MCH: 29.4 pg (ref 26.0–34.0)
MCH: 29.6 pg (ref 26.0–34.0)
MCHC: 33.8 g/dL (ref 30.0–36.0)
MCV: 87.3 fL (ref 78.0–100.0)
Platelets: 234 10*3/uL (ref 150–400)
RBC: 4.01 MIL/uL (ref 3.87–5.11)
RDW: 14.2 % (ref 11.5–15.5)
WBC: 5.1 10*3/uL (ref 4.0–10.5)

## 2011-02-19 LAB — GLUCOSE, CAPILLARY
Glucose-Capillary: 109 mg/dL — ABNORMAL HIGH (ref 70–99)
Glucose-Capillary: 173 mg/dL — ABNORMAL HIGH (ref 70–99)
Glucose-Capillary: 220 mg/dL — ABNORMAL HIGH (ref 70–99)
Glucose-Capillary: 87 mg/dL (ref 70–99)

## 2011-02-19 LAB — AFB CULTURE WITH SMEAR (NOT AT ARMC)

## 2011-02-19 LAB — CK TOTAL AND CKMB (NOT AT ARMC)
Relative Index: INVALID (ref 0.0–2.5)
Total CK: 68 U/L (ref 7–177)

## 2011-02-19 LAB — CRYPTOSPORIDIUM SMEAR, FECAL

## 2011-02-19 LAB — OVA AND PARASITE EXAMINATION

## 2011-02-19 LAB — URINE CULTURE
Colony Count: NO GROWTH
Culture  Setup Time: 201109291455

## 2011-02-19 LAB — AMMONIA: Ammonia: 33 umol/L (ref 11–35)

## 2011-02-19 LAB — URINE MICROSCOPIC-ADD ON

## 2011-02-19 LAB — STOOL CULTURE

## 2011-02-19 LAB — HIV-1 RNA QUANT-NO REFLEX-BLD: HIV-1 RNA Quant, Log: <1.3 {Log} (ref <1.30)

## 2011-02-19 LAB — GIARDIA/CRYPTOSPORIDIUM SCREEN(EIA): Cryptosporidium Screen (EIA): NEGATIVE

## 2011-02-19 LAB — CLOSTRIDIUM DIFFICILE EIA: C difficile Toxins A+B, EIA: NEGATIVE

## 2011-02-19 NOTE — Telephone Encounter (Signed)
The pharmacy was called and verbal order given to change to Novolog insulin.

## 2011-02-19 NOTE — Telephone Encounter (Signed)
I cannot figure out how to change the name of the brand without re-writing the huge instruction on dosing.  Call pharmacy and verbally approve change.  If you can show me how to change the brand, do so.

## 2011-02-19 NOTE — Telephone Encounter (Signed)
[facility] called and stated pt's insurance will pay for Novolog qwikpen but not Humalog; can you change brand name.  Thanks

## 2011-02-20 MED ORDER — GLUCOSE BLOOD VI STRP: ORAL_STRIP | Status: DC

## 2011-02-20 MED ORDER — ACCU-CHEK SOFTCLIX LANCET DEV MISC: Status: DC

## 2011-02-22 LAB — T-HELPER CELL (CD4) - (RCID CLINIC ONLY): CD4 T Cell Abs: 540 uL (ref 400–2700)

## 2011-02-23 ENCOUNTER — Other Ambulatory Visit: Payer: Self-pay | Admitting: Gastroenterology

## 2011-02-23 ENCOUNTER — Encounter (AMBULATORY_SURGERY_CENTER): Payer: Medicaid Other | Admitting: Gastroenterology

## 2011-02-23 DIAGNOSIS — D126 Benign neoplasm of colon, unspecified: Secondary | ICD-10-CM

## 2011-02-23 DIAGNOSIS — Z1211 Encounter for screening for malignant neoplasm of colon: Secondary | ICD-10-CM

## 2011-02-23 LAB — GLUCOSE, CAPILLARY: Glucose-Capillary: 175 mg/dL — ABNORMAL HIGH (ref 70–99)

## 2011-02-24 ENCOUNTER — Telehealth: Payer: Self-pay | Admitting: *Deleted

## 2011-02-24 NOTE — Telephone Encounter (Signed)
Received a call from [facility] with [facility] asking for pt's PCP. Pt is seen in ID and several hospital admissions with f/u in out clinic. [facility]ity has Dr Arvilla Market as PCP. Pt had colonoscopy on 3/19 and Assisted Living is asking when pt should restart insulin. She is on insulin prior to meals. If pt is eating I would assume she is to restart meds as before procedure.   Please advise.

## 2011-02-24 NOTE — Telephone Encounter (Signed)
Dr. Arvilla Market is off today.  I agree, patient appears to be on insulin lispro and should therefore restart her prior regimen which should be before meals.

## 2011-02-24 NOTE — Telephone Encounter (Signed)
[name] with [facility] in [place] [phone] or [phone])  lm that Dr. Lessie Dings ordered a different insulin that pt usually uses after she had her colonoscopy yesterday. Her insurance will not pay for it. I called her back & spoke with med tech, [employer], since Ripley had gone home. Asked her to call Dr. Aundria Rud for a change of order. States she has his number & will do that.Faustino Congress

## 2011-02-24 NOTE — Telephone Encounter (Signed)
Called [facility] and [facility] informed. Jamison Neighbor will call tomorrow and f/u .

## 2011-02-25 ENCOUNTER — Other Ambulatory Visit: Payer: Self-pay | Admitting: Dietician

## 2011-02-25 ENCOUNTER — Other Ambulatory Visit: Payer: Self-pay | Admitting: *Deleted

## 2011-02-25 MED ORDER — INSULIN ASPART 100 UNIT/ML ~~LOC~~ SOLN: SUBCUTANEOUS | Status: DC

## 2011-02-25 MED ORDER — INSULIN PEN NEEDLE 31G X 8 MM MISC: Status: DC

## 2011-02-25 NOTE — Telephone Encounter (Signed)
Discussed with attending. Printed signed order for new insulin and faxed to Bennie Hind at [facility].  [phone]  Darel Hong reports patient blood sugars since new insulin regimen are: Fasting    12 PM        4 PM           8 PM 85-286     122-399    155-348     197-404  Patient may need more mealtime insulin as trend appears to be increasing daytime blood sugars:Need to evaluate actual blood glucose log to clearly establish this pattern.  will route to her PCP, Dr. Arvilla Market and request patient be scheduled an appointment with PCP.

## 2011-02-28 ENCOUNTER — Encounter: Payer: Self-pay | Admitting: Gastroenterology

## 2011-03-01 LAB — T-HELPER CELL (CD4) - (RCID CLINIC ONLY): CD4 T Cell Abs: 510 uL (ref 400–2700)

## 2011-03-05 NOTE — Procedures (Addendum)
Summary: Colonoscopy  Patient: [name] Note: All result statuses are Final unless otherwise noted.  Tests: (1) Colonoscopy (COL)   COL Colonoscopy           DONE     [facility]     [address]     [place], [place]  [address]          COLONOSCOPY PROCEDURE REPORT          PATIENT:  Jasmine, [name]  MR#:  [id number]     BIRTHDATE:  October 08, 1956, 54 yrs. old  GENDER:  female          ENDOSCOPIST:  Barbette Hair. Arlyce Dice, MD     Referred by:  Yisroel Ramming, M.D.          PROCEDURE DATE:  02/23/2011     PROCEDURE:  Colonoscopy with snare polypectomy     ASA CLASS:  Class II     INDICATIONS:  1) Routine Risk Screening          MEDICATIONS:   MAC sedation, administered by CRNA Propofol 200mg      IV          DESCRIPTION OF PROCEDURE:   After the risks benefits and     alternatives of the procedure were thoroughly explained, informed     consent was obtained.  Digital rectal exam was performed and     revealed no abnormalities.   The LB CF-[id number] endoscope     was introduced through the anus and advanced to the cecum, which     was identified by both the appendix and ileocecal valve, without     limitations.  The quality of the prep was excellent, using     MoviPrep.  The instrument was then slowly withdrawn as the colon     was fully examined.     <<PROCEDUREIMAGES>>          FINDINGS:  A pedunculated polyp was found in the proximal     transverse colon. It was 8 mm in size. Polyp was snared, then     cauterized with monopolar cautery. Retrieval was successful (see     image1). snare polyp  This was otherwise a normal examination of     the colon (see image2, image4, image6, image8, image9, and     image10).   Retroflexed views in the rectum revealed no     abnormalities.    The time to cecum =  5.75  minutes. The scope     was then withdrawn (time =  6.0  min) from the patient and the     procedure completed.          COMPLICATIONS:  None       ENDOSCOPIC IMPRESSION:     1) 8 mm pedunculated polyp in the proximal transverse colon     2) Otherwise normal examination     RECOMMENDATIONS:     1) If the polyp(s) removed today are proven to be adenomatous     (pre-cancerous) polyps, you will need a repeat colonoscopy in 5     years. Otherwise you should continue to follow colorectal cancer     screening guidelines for "routine risk" patients with colonoscopy     in 10 years.          REPEAT EXAM:   You will receive a letter from Dr. Arlyce Dice in 1-2     weeks, after reviewing the final pathology, with followup  recommendations.          ______________________________     Barbette Hair Arlyce Dice, MD          CC:          n.     eSIGNED:   Barbette Hair. Larinda Herter at 02/23/2011 09:06 AM          Weidmann, Harriett Sine, [phone]  Note: An exclamation mark (!) indicates a result that was not dispersed into the flowsheet. Document Creation Date: 02/23/2011 9:06 AM _______________________________________________________________________  (1) Order result status: Final Collection or observation date-time: 02/23/2011 08:57 Requested date-time:  Receipt date-time:  Reported date-time:  Referring Physician:   Ordering Physician: Melvia Heaps [phone]) Specimen Source:  Source: Launa Grill Order Number: [id number] Lab site:   Appended Document: Colonoscopy     Procedures Next Due Date:    Colonoscopy: 03/2016

## 2011-03-09 LAB — COMPREHENSIVE METABOLIC PANEL
Albumin: 3.1 g/dL — ABNORMAL LOW (ref 3.5–5.2)
BUN: 9 mg/dL (ref 6–23)
Chloride: 104 mEq/L (ref 96–112)
Creatinine, Ser: 1 mg/dL (ref 0.4–1.2)
GFR calc non Af Amer: 58 mL/min — ABNORMAL LOW (ref >60)
Glucose, Bld: 373 mg/dL — ABNORMAL HIGH (ref 70–99)
Total Bilirubin: 0.6 mg/dL (ref 0.3–1.2)

## 2011-03-09 LAB — POCT I-STAT, CHEM 8
Chloride: 108 mEq/L (ref 96–112)
HCT: 36 % (ref 36.0–46.0)
Hemoglobin: 12.2 g/dL (ref 12.0–15.0)
Potassium: 4.9 mEq/L (ref 3.5–5.1)
Sodium: 138 mEq/L (ref 135–145)

## 2011-03-09 LAB — DIFFERENTIAL
Basophils Absolute: 0.1 10*3/uL (ref 0.0–0.1)
Lymphocytes Relative: 25 % (ref 12–46)
Monocytes Absolute: 0.6 10*3/uL (ref 0.1–1.0)
Neutro Abs: 4.3 10*3/uL (ref 1.7–7.7)
Neutrophils Relative %: 63 % (ref 43–77)

## 2011-03-09 LAB — URINALYSIS, ROUTINE W REFLEX MICROSCOPIC
Bilirubin Urine: NEGATIVE
Hgb urine dipstick: NEGATIVE
Ketones, ur: NEGATIVE mg/dL
Protein, ur: NEGATIVE mg/dL
Urobilinogen, UA: 1 mg/dL (ref 0.0–1.0)

## 2011-03-09 LAB — URINE MICROSCOPIC-ADD ON

## 2011-03-09 LAB — CBC
HCT: 34.8 % — ABNORMAL LOW (ref 36.0–46.0)
MCV: 90.2 fL (ref 78.0–100.0)
Platelets: 247 10*3/uL (ref 150–400)
WBC: 6.9 10*3/uL (ref 4.0–10.5)

## 2011-03-09 LAB — LIPASE, BLOOD: Lipase: 25 U/L (ref 11–59)

## 2011-03-10 LAB — T-HELPER CELL (CD4) - (RCID CLINIC ONLY)
CD4 % Helper T Cell: 29 % — ABNORMAL LOW (ref 33–55)
CD4 T Cell Abs: 370 uL — ABNORMAL LOW (ref 400–2700)

## 2011-03-13 LAB — T-HELPER CELL (CD4) - (RCID CLINIC ONLY)
CD4 % Helper T Cell: 28 % — ABNORMAL LOW (ref 33–55)
CD4 T Cell Abs: 510 uL (ref 400–2700)

## 2011-03-15 LAB — CBC
Hemoglobin: 12.3 g/dL (ref 12.0–15.0)
MCHC: 33.7 g/dL (ref 30.0–36.0)
MCV: 90.8 fL (ref 78.0–100.0)
RBC: 4.01 MIL/uL (ref 3.87–5.11)
RDW: 14.1 % (ref 11.5–15.5)

## 2011-03-15 LAB — BASIC METABOLIC PANEL
CO2: 24 mEq/L (ref 19–32)
Calcium: 9 mg/dL (ref 8.4–10.5)
Chloride: 104 mEq/L (ref 96–112)
Creatinine, Ser: 0.8 mg/dL (ref 0.4–1.2)
Glucose, Bld: 86 mg/dL (ref 70–99)

## 2011-03-15 LAB — DIFFERENTIAL
Basophils Absolute: 0 10*3/uL (ref 0.0–0.1)
Basophils Relative: 1 % (ref 0–1)
Eosinophils Absolute: 0 10*3/uL (ref 0.0–0.7)
Monocytes Absolute: 0.4 10*3/uL (ref 0.1–1.0)
Monocytes Relative: 7 % (ref 3–12)
Neutro Abs: 4.6 10*3/uL (ref 1.7–7.7)
Neutrophils Relative %: 77 % (ref 43–77)

## 2011-03-15 LAB — GLUCOSE, CAPILLARY

## 2011-03-15 LAB — URINALYSIS, ROUTINE W REFLEX MICROSCOPIC
Bilirubin Urine: NEGATIVE
Glucose, UA: 100 mg/dL — AB
Hgb urine dipstick: NEGATIVE
Protein, ur: NEGATIVE mg/dL
Urobilinogen, UA: 0.2 mg/dL (ref 0.0–1.0)

## 2011-03-23 LAB — URINE MICROSCOPIC-ADD ON

## 2011-03-23 LAB — CBC
HCT: 37.3 % (ref 36.0–46.0)
MCHC: 32.5 g/dL (ref 30.0–36.0)
MCV: 91.6 fL (ref 78.0–100.0)
Platelets: 248 10*3/uL (ref 150–400)
RDW: 13.8 % (ref 11.5–15.5)
WBC: 5.7 10*3/uL (ref 4.0–10.5)

## 2011-03-23 LAB — COMPREHENSIVE METABOLIC PANEL
AST: 61 U/L — ABNORMAL HIGH (ref 0–37)
Albumin: 3.4 g/dL — ABNORMAL LOW (ref 3.5–5.2)
BUN: 9 mg/dL (ref 6–23)
Calcium: 8.7 mg/dL (ref 8.4–10.5)
Chloride: 107 mEq/L (ref 96–112)
Creatinine, Ser: 0.81 mg/dL (ref 0.4–1.2)
GFR calc Af Amer: >60 mL/min (ref >60)
Total Bilirubin: 0.4 mg/dL (ref 0.3–1.2)

## 2011-03-23 LAB — GLUCOSE, CAPILLARY
Glucose-Capillary: 230 mg/dL — ABNORMAL HIGH (ref 70–99)
Glucose-Capillary: 53 mg/dL — ABNORMAL LOW (ref 70–99)

## 2011-03-23 LAB — T-HELPER CELL (CD4) - (RCID CLINIC ONLY)
CD4 % Helper T Cell: 29 % — ABNORMAL LOW (ref 33–55)
CD4 T Cell Abs: 390 uL — ABNORMAL LOW (ref 400–2700)

## 2011-03-23 LAB — URINALYSIS, ROUTINE W REFLEX MICROSCOPIC
Nitrite: NEGATIVE
Protein, ur: NEGATIVE mg/dL
Specific Gravity, Urine: 1.017 (ref 1.005–1.030)
Urobilinogen, UA: 0.2 mg/dL (ref 0.0–1.0)

## 2011-03-23 LAB — DIFFERENTIAL
Basophils Absolute: 0 10*3/uL (ref 0.0–0.1)
Lymphocytes Relative: 14 % (ref 12–46)
Lymphs Abs: 0.8 10*3/uL (ref 0.7–4.0)
Monocytes Absolute: 0.4 10*3/uL (ref 0.1–1.0)
Neutro Abs: 4.5 10*3/uL (ref 1.7–7.7)

## 2011-03-23 LAB — POCT CARDIAC MARKERS
CKMB, poc: <1 ng/mL — ABNORMAL LOW (ref 1.0–8.0)
Troponin i, poc: <0.05 ng/mL (ref 0.00–0.09)

## 2011-03-25 ENCOUNTER — Ambulatory Visit (INDEPENDENT_AMBULATORY_CARE_PROVIDER_SITE_OTHER): Payer: Medicaid Other | Admitting: Dietician

## 2011-03-25 ENCOUNTER — Ambulatory Visit (INDEPENDENT_AMBULATORY_CARE_PROVIDER_SITE_OTHER): Payer: Medicaid Other | Admitting: Internal Medicine

## 2011-03-25 ENCOUNTER — Encounter: Payer: Self-pay | Admitting: Internal Medicine

## 2011-03-25 VITALS — BP 123/81 | HR 112 | Temp 97.0°F | Ht 64.0 in | Wt 141.8 lb

## 2011-03-25 DIAGNOSIS — I739 Peripheral vascular disease, unspecified: Secondary | ICD-10-CM

## 2011-03-25 DIAGNOSIS — E109 Type 1 diabetes mellitus without complications: Secondary | ICD-10-CM

## 2011-03-25 DIAGNOSIS — Z23 Encounter for immunization: Secondary | ICD-10-CM

## 2011-03-25 NOTE — Assessment & Plan Note (Signed)
I would check ABIs in both legs and followup. Advised smoking cessation but patient is not willing.

## 2011-03-25 NOTE — Patient Instructions (Signed)
Intermittent Claudication Blockage of Leg Arteries Blockage of leg arteries results from poor circulation of blood in the leg arteries. This produces an aching, tired, and sometimes burning pain in the legs that is brought on by exercise and made better by rest. Claudication refers to the limping that happens from leg cramps. It is also referred to as Vaso-occlusive disease of the legs, arterial insufficiency of the legs, recurrent leg pain, recurrent leg cramping and calf pain with exercise.  SYMPTOMS Intermittent claudication may occur in both legs, and it often continues to get worse over time. However, some people complain only of weakness in the legs when walking, or a feeling of "tiredness" in the buttocks. Impotence (not able to have an erection) is an occasional complaint in men. Pain while resting is uncommon.  COMMON CAUSES  This condition is due to narrowing or blockage of the arteries (muscular vessels which carry blood away from the heart and around the body). Blockage of arteries can occur anywhere in the body. If they occur in the heart, a person may experience angina (chest pain) or even a heart attack. If they occur in the neck or the brain, a person may have a stroke. Intermittent claudication is when the blockage occurs in the legs, most commonly in the calf or the foot.   Atherosclerosis, or blockage of arteries, can occur for many reasons. Some of these are smoking, diabetes, and high cholesterol.  HOME CARE  Talk to your caregiver about the cause of your leg cramping and about what to do at home to relieve it.   A healthy diet is important to lessen the likeliness of atherosclerosis.   A program of daily walking for short periods, and stopping for pain or cramping, may help improve function.   It is important to stop smoking.   Avoid putting hot or cold items on legs.   Avoid tight shoes.   CALL YOUR HEALTHCARE PROVIDER IF: There are many other causes of leg pain such  as arthritis or low blood potassium. However, some causes of leg pain may be life threatening such as a blood clot in the legs. Seek medical attention if you have:  Leg pain that does not go away.   Legs that may be red, hot or swollen.   Ulcers or sores appear on your ankle or foot.   Any chest pain or shortness of breath accompanying leg pain.   Diabetes.   You are pregnant.   WHAT TO EXPECT AT [facility] PROVIDER'S OFFICE: Your medical history will be asked for and a physical examination will be performed. Medical history questions documenting claudication in detail may include:   Time pattern   Do you have leg cramps at night (nocturnal cramps)?   How often does leg pain with cramping occur?   Is it getting worse?   What is the quality of the pain?   Is the pain sharp?   Is there an aching pain with the cramps?   Aggravating factors   Is it worse after you exercise?   Is it worse after you are standing for a while?   Do you smoke? How much?   Do you drink alcohol? How much?   Are you diabetic? How well is your blood sugar controlled?   Other   What other symptoms are also present?   Has there been impotence (men)?   Is there pain in the back?   Is there a darkening of the skin of the  legs, feet or toes?   Is there weakness or paralysis of the legs?  The physical examination may include evaluation of the femoral pulse (in the groin) and the other areas where the pulse can be felt in the legs. DIAGNOSTIC TESTS THAT MAY BE PERFORMED INCLUDE:   Blood pressure measured in arms and legs for comparison.   Doppler ultrasonography on the legs and the heart.   Duplex Doppler/ultrasound exam of extremity to visualize arterial blood flow.   ECG- to evaluate the activity of your heart.   Aortography- to visualize blockages in your arteries.  INTERVENTION: Surgical treatment may be suggested if claudication interferes with the patient's activities or  work, and if the diseased arteries do not seem to be improving after treatment. Be aware that this condition can worsen over time and you should carefully monitor your condition. SEEK IMMEDIATE MEDICAL CARE IF:  Your leg pain becomes severe or will not go away.   Your foot turns blue or a dark color.   Your leg becomes red, hot or swollen or you develop a fever over 102F.   Any chest pain or shortness of breath accompanying leg pain.  MAKE SURE YOU:   Understand these instructions.   Will watch your condition.   Will get help right away if you are not doing well or get worse.  Document Released: 09/25/2004 Document Re-Released: 11/05/2008 [facility] Patient Information 2011 [place], [place].  Follow up in 3 months. Stop smoking.

## 2011-03-25 NOTE — Patient Instructions (Signed)
Please have staff record either meal coverage or correction or both CONSISTENTLY on Glucose Monitorng Sheet.  Please have patient try to walk daily and record by putting a check mark in box on days that she walks.  Call Jamison Neighbor [phone] to tell us how much meal coverage insulin patient is getting at her three meals  - our records say 6 units plus correction and patient and your records indicate that patient is getting 8 units and correction.  Keep up the good work!

## 2011-03-25 NOTE — Progress Notes (Signed)
  Subjective:    Patient ID: [name], female    DOB: [birth year], 55 y.o.   MRN: [id number]  HPI  [name] this 55 year old female who is currently staying in a nursing home. She is here today for diabetes discussion as per Dr. Arvilla Market. Patient met with Dorthula Matas.  Patient complaints of pain in her bilateral legs especially calf which is worse with walking and relieved with rest. Pain is described as 10 out of 10 at its worse. No radiation, no fevers chills or shortness of breath, not associated with any specific posture.   Patient has been a smoker for about 35 years now.  Review of Systems  Constitutional: Negative for fever, activity change and appetite change.  HENT: Negative for sore throat.   Respiratory: Negative for cough and shortness of breath.   Cardiovascular: Negative for chest pain and leg swelling.  Gastrointestinal: Negative for nausea, abdominal pain, diarrhea, constipation and abdominal distention.  Genitourinary: Negative for frequency, hematuria and difficulty urinating.  Neurological: Negative for dizziness and headaches.  Psychiatric/Behavioral: Negative for suicidal ideas and behavioral problems.       Objective:   Physical Exam  Constitutional: She is oriented to person, place, and time. She appears well-developed and well-nourished.  HENT:  Head: Normocephalic and atraumatic.  Eyes: Conjunctivae and EOM are normal. Pupils are equal, round, and reactive to light. No scleral icterus.  Neck: Normal range of motion. Neck supple. No JVD present. No thyromegaly present.  Cardiovascular: Normal rate, regular rhythm and normal heart sounds.  Exam reveals no gallop and no friction rub.   No murmur heard. Pulmonary/Chest: Effort normal and breath sounds normal. No respiratory distress. She has no wheezes. She has no rales.  Abdominal: Soft. Bowel sounds are normal. She exhibits no distension and no mass. There is no tenderness. There is no rebound and no  guarding.  Musculoskeletal: Normal range of motion. She exhibits no edema and no tenderness.  Lymphadenopathy:    She has no cervical adenopathy.  Neurological: She is alert and oriented to person, place, and time.  Skin: Skin is warm and dry.  Psychiatric: She has a normal mood and affect. Her behavior is normal.          Assessment & Plan:

## 2011-03-25 NOTE — Progress Notes (Signed)
Diabetes Self-Management Training (DSMT)  Follow up #1  03/25/2011 Ms. [employer], identified by name and date of birth, is a 55 y.o. female with Type 1 Diabetes. Year of diabetes diagnosis: 2005 Other persons present: -Thereasa Distance ( identified as Astronomer) from [facility]  ASSESSMENT Patient concerns are Monitoring and Glycemic control.  Weight 141 lb 12.8 oz (64.32 kg). There is no height on file to calculate BMI. Lab Results  Component Value Date   LDLCALC 51 12/18/2010   Lab Results  Component Value Date   HGBA1C 9.5* 01/08/2011   Medication Nutrition Monitor: used accu-chek comact plus today with results was 119 after fasting blood sugar of 155 and 1 unit of correction insulin  Labs reviewed.  DIABETES BUNDLE: A1C in past 6 months? Yes.  Less than 7%? No, but see scanned blood sugar record- they are much improved this month Eye exam in past year?yes- faxed office for records. Tobacco use? Yes.  Smoking cessation offered? Not today   Family history of diabetes: No  Support systems: assisted living  Special needs: Simplified materials, Verbal instruction, Instruct caregiver  Prior DM Education: Yes   Medications See Medications list.  Not taking as prescribed and Needs skills/knowledge review  Patients belief/attitude about diabetes: Diabetes can be controlled.  Self foot exams daily: Yes  Diabetes Complications: Neuropathy   Exercise Plan Doing ADLs and does not walk much because she has pain in calves with exercise that is relieved by rest for 5 minutesa day.   Self-Monitoring Frequency of testing: 4 times/day Breakfast: 87-270 with15/18 < 190 Lunch:106-302 with 10/18 < 190 Supper: 74-298 with 10/18 < 190 Bedtime: 40-304 with 8/18 < 190  Hyperglycemia: Yes, weekly less this week than last Hypoglycemia: Not sure if one value was 40 or 140, patient and staff did not report hypoglycemia as being a problem  Meal  Planning Improving Limited knowledge and Interested in improving   Assessment comments: Records from  assisted living are inconsistent in the recording of the mealtime insulin dose- it sometimes appears to reflect correction only and sometimes both correction and meal coverage. Basal insulin appears appropriate. Weight stable since last visit.     INDIVIDUAL DIABETES EDUCATION PLAN:  Nutrition management Medical Nutrition Therapy Physical activity and exercise Medication Monitoring Acute complication: _______________________________________________________________________  Intervention TOPICS COVERED TODAY:  Nutrition management  Role of diet in the treatment of diabetes and the relationship between the three main macronutritents and blood glucose control. carb consistency stressed today and taught to [facility] with printout provided and new diet order to AL to reflect the same Medical Nutrition Therapy Physical activity and exercise  Role of exercise on diabetes management, blood pressure control and cardiac health. Helped patient identify appropriate exercises in relation to his/her diabetes, diabetes complications and other health issue. Medication  assisted with adopting currfent insulin dose to patient nutrition needs and desires Monitoring  Taught/evaluated SMBG with accucheck compact plus meter. Acute complication  Taught treatment of hypoglycemia - the 15 rule.  PATIENTS GOALS/PLAN (copy and paste in patient instructions so patient receives a copy): 1.  Learning Objective:       See patient instructions 2.  Behavioral Objective:         Nutrition: To improve blood glucose control I will follow meal plan of 3 carb portions a meal and < 1 for snacks           Never 50% Physical Activity: For improved blood glucose control and decrease insulin resistance, I  will exercise daily walk halfway around the building days a week       Never 25% Monitoring: To identify blood glucose trends, I  will test my blood glucose check blood sugar when I have signs or symptoms of low blood sugar day and        Never 100%  Personalized Follow-Up Plan for Ongoing Self Management Support:  Doctor's Office, CDE visits and Staff at AL ______________________________________________________________________   Outcomes Expected outcomes: Demonstrated interest in learning.Expect positive changes in lifestyle.  Self-care Barriers: Debilitated state due to current medical condition, Lack of transportation  Education material provided: carb counting booklet, poster and 2 simple sheets with pcitures of carb portions  Patient to contact team via Phone if problems or questions.  Time in: 1015     Time out: 1115  Future DSMT - 8 wks   RILEY,DONNA

## 2011-03-25 NOTE — Progress Notes (Signed)
Addended by: Kingsley Spittle on: 03/25/2011 12:25 PM   Modules accepted: Orders

## 2011-04-01 ENCOUNTER — Ambulatory Visit (HOSPITAL_COMMUNITY)
Admission: RE | Admit: 2011-04-01 | Discharge: 2011-04-01 | Disposition: A | Discharge status: Home / Self Care | Payer: Medicaid Other | Source: Ambulatory Visit | Attending: Internal Medicine | Admitting: Internal Medicine

## 2011-04-01 DIAGNOSIS — E119 Type 2 diabetes mellitus without complications: Secondary | ICD-10-CM | POA: Insufficient documentation

## 2011-04-01 DIAGNOSIS — M79609 Pain in unspecified limb: Secondary | ICD-10-CM | POA: Insufficient documentation

## 2011-04-01 DIAGNOSIS — F17200 Nicotine dependence, unspecified, uncomplicated: Secondary | ICD-10-CM | POA: Insufficient documentation

## 2011-04-01 DIAGNOSIS — I739 Peripheral vascular disease, unspecified: Secondary | ICD-10-CM | POA: Insufficient documentation

## 2011-04-08 NOTE — Progress Notes (Signed)
Vital Signs:  Patient profile: 55 year old female Menstrual status:  postmenopausal Height:    65 inches Weight:    133 pounds BMI:  22.21 Temp:  97.3 degrees F oral Pulse rate: 118 / minute BP sitting: 135 / 92  (right arm)  Vitals Entered By: Alesia Morin CMA (January 08, 2011 2:21 PM) CC: follow-up visit for lab work, pt states she needs refills on meds but her pain meds are not working for her anymore wants to discuss Is Patient Diabetic? Yes Did you bring your meter with you today? No Pain Assessment Patient in pain? no      Nutritional Status BMI of 19 -24 = normal Nutritional Status Detail appetite "so-so"  Have you ever been in a relationship where you felt threatened, hurt or afraid?No   Does patient need assistance?  Functional Status Self care Ambulation Normal Comments no missed doses   Primary Provider:  Nelda Bucks DO  CC:  follow-up visit for lab work and pt states she needs refills on meds but her pain meds are not working for her anymore wants to discuss.  History of Present Illness:  this 55 year old African American female with a history of HIV disease, diabetes, type II hepatitis C. infection presents for a followup on her HIV. She currently resides in a skilled nursing facility , where she gets her medications provided to her by the facility personnel. She relates that she is still having chronic pain to her extremities, but is not improving , and her pain medications. " no longer work." she now also relates that she is always  " thirsty"   and she needs to " urinate a lot ". A review of records from the facility shows significant elevations in CBGs pre-and postprandially in spite of coverage. Many CBGs were greater than 400 mg/dL with most between 161-096 mg/dL. She relates she does not want to hear to any specific "diet " because she likes eating what she likes eating. Most of the pain, that she describes is neuropathic in nature with numbness , burning,  and tingling to both lower  extremities. She denies any significant visual changes over the past 6 months. And denies the presence of any nonhealing wounds. She has not been evaluated by either an internist or endocrinologist for her diabetes nor has she seen a diabetic educator.  Preventive Screening-Counseling & Management  Alcohol-Tobacco     Alcohol drinks/day: 0     Smoking Status: current     Smoking Cessation Counseling: yes     Packs/Day: <0.25     Passive Smoke Exposure: yes  Caffeine-Diet-Exercise     Caffeine use/day: 1 cup of coffee in the morning     Does Patient Exercise: yes     Type of exercise: WALKS SHORT DISTANCE     Exercise (avg: min/session): <30     Times/week: 7  Hep-HIV-STD-Contraception     HIV Risk: risk noted  Safety-Violence-Falls     Seat Belt Use: yes      Sexual History:  n/a.        Drug Use:  former.    Comments:  pt declined condoms  Allergies (verified):  No Known Drug Allergies  Past History:  Past medical, surgical, family and social histories (including risk factors) reviewed for relevance to current acute and chronic problems.  Past Medical History: Reviewed history from 10/10/2010 and no changes required. Depression diabetes Neuropathy  Family History: Reviewed history from 12/04/2009 and no changes required. noncontributory  Social History: Reviewed history from 10/10/2010 and no changes required. Alcohol use- denies currenlty,  Hx of cocaine use- pt denies current illicit drug use lives in ALF  Review of Systems  General:  Denies chills, fatigue, fever, loss of appetite, malaise, sleep disorder, sweats, weakness, and weight loss. Eyes:  Denies blurring, discharge, double vision, eye irritation, eye pain, halos, itching, light sensitivity, red eye, vision loss-1 eye, and vision loss-both eyes. ENT:  Complains of nasal congestion and sinus pressure; denies decreased hearing, difficulty swallowing, ear discharge,  earache, hoarseness, nosebleeds, postnasal drainage, ringing in ears, and sore throat. CV:  Denies bluish discoloration of lips or nails, chest pain or discomfort, difficulty breathing at night, difficulty breathing while lying down, fainting, fatigue, leg cramps with exertion, lightheadness, near fainting, palpitations, shortness of breath with exertion, swelling of feet, swelling of hands, and weight gain. Resp:  Denies chest discomfort, chest pain with inspiration, cough, coughing up blood, excessive snoring, hypersomnolence, morning headaches, pleuritic, shortness of breath, sputum productive, and wheezing. GI:  Denies abdominal pain, bloody stools, change in bowel habits, constipation, dark tarry stools, diarrhea, excessive appetite, gas, hemorrhoids, indigestion, loss of appetite, nausea, vomiting, vomiting blood, and yellowish skin color. GU:  Complains of urinary frequency; denies abnormal vaginal bleeding, decreased libido, discharge, dysuria, genital sores, hematuria, incontinence, nocturia, and urinary hesitancy. MS:  Complains of joint pain and stiffness; denies joint redness, joint swelling, loss of strength, low back pain, mid back pain, muscle aches, muscle , cramps, muscle weakness, and thoracic pain. Derm:  Denies changes in color of skin, changes in nail beds, dryness, excessive perspiration, flushing, hair loss, insect bite(s), itching, lesion(s), poor wound healing, and rash. Neuro:  Complains of difficulty with concentration, disturbances in coordination, numbness, tingling, and weakness; denies brief paralysis, falling down, headaches, inability to speak, memory loss, poor balance, seizures, sensation of room spinning, tremors, and visual disturbances. Psych:  Complains of anxiety, easily angered, and irritability; denies alternate hallucination ( auditory/visual), depression, easily tearful, mental problems, panic attacks, sense of great danger, suicidal thoughts/plans, thoughts of  violence, unusual visions or sounds, and thoughts /plans of harming others. Endo:  Complains of excessive hunger, excessive thirst, excessive urination, and polyuria; denies cold intolerance, heat intolerance, and weight change.  Physical Exam  General:  alert, well-developed, well-nourished, and well-hydrated.   Head:  normocephalic and atraumatic.   Eyes:  vision grossly intact, pupils equal, pupils round, pupils reactive to light, and no retinal abnormalitiies.   Ears:  R ear normal and L ear normal.   Nose:  no external deformity, no external erythema, and no nasal discharge.   Mouth:  no gingival abnormalities, no dental plaque, pharynx pink and moist, and fair dentition.   Neck:  supple, full ROM, and no masses.   Lungs:  normal respiratory effort, no intercostal retractions, and normal breath sounds.   Heart:  normal rate, regular rhythm, no murmur, no gallop, no rub, and no JVD.   Abdomen:  soft, non-tender, normal bowel sounds, no distention, no masses, no splenomegaly, and hepatomegaly.   Msk:  no joint tenderness, no joint swelling, no joint warmth, no redness over joints, no joint deformities, no joint instability, no crepitation, and no muscle atrophy.   Extremities:  No clubbing, cyanosis, edema, or deformity noted with normal full range of motion of all joints.   Neurologic:  alert & oriented X3, cranial nerves II-XII intact, strength normal in all extremities, RLE sensory loss, and LLE sensory loss.   Skin:  turgor normal,  color normal, no rashes, and no suspicious lesions.   Psych:  good eye contact, not anxious appearing, not depressed appearing, and flat affect.     Impression & Recommendations:  Problem # 1:  HIV INFECTION (ICD-042)  from January 2012, her CD4 count was 771, at 31% with a viral load of less than 20 copies per mL. She is clinically stable on her current regimen. We recommend continuing present management. Repeat labs in 10 weeks and a followup in 3 months.  Verbally acknowledged this information. Orders: Est. Patient Level IV (99214)Future Orders: T-CBC w/Diff ([id number]) ... 03/19/2011 T-CD4SP ([facility]) (CD4SP) ... 03/19/2011 T-Comprehensive Metabolic Panel [phone]) ... 03/19/2011 T-HIV Viral Load [id number]) ... 03/19/2011  Problem # 2:  IDDM (ICD-250.01)  AP OCT hemoglobin A1c was performed today with a value of 9.5%. Based on her history and what she said during this visit, it is apparent. There are some behavior issues and attitudes regarding her diabetes. We recommend a referral to endocrinology and a referral to diabetic educator.  It was explained to her that many of the symptoms. She is complaining about regarding neuropathic pain as well as her increased thirst and her increased urination were all related  her blood sugars. although she appeared to be listening it was evident she was neither comprehending nor willing to understand the cause and effect relationship here. Meantime , we will change her current insulin therapy 2 Lantus 30 units subcutaneous each bedtime plus  10 units NovoLog subcutaneous a.c. with additional 1 unit NovoLog for every 30 mg per deciliter of CBG  over 100 mg per deciliter. Additionally , she needs to be on a carbohydrate restricted diet. We will defer any further management to the endocrinologist and recommendations from the diabetic educator. Her updated medication list for this problem includes:    Lantus 100 Unit/ml Soln (Insulin glargine) .Marland KitchenMarland KitchenMarland KitchenMarland Kitchen 30 units qhs    Humalog Kwikpen 100 Unit/ml Soln (Insulin lispro (human)) ..... Inject 5 units three times a day with meals and sliding scale    Aspirin Ec 81 Mg Tbec (Aspirin) .Marland Kitchen... Take one tab by mouth daily    Novolog Penfill 100 Unit/ml Soln (Insulin aspart) .Marland KitchenMarland KitchenMarland KitchenMarland Kitchen 10 u subcutaneously three times a day before meals plus additional units per sliding scale.  Orders: Est. Patient Level IV (62952) T-Hgb A1C (in-house) (84132GM) Diabetic Clinic Referral  (Diabetic) Endocrinology Referral (Endocrine)Future Orders: T-Lipid Profile ([id number]) ... 03/19/2011 T-Hgb A1C (in-house) [phone]) ... 03/19/2011  Problem # 3:  OTHER CHRONIC PAIN (ICD-338.29)  she reports her pain is refractory to her current medication however many of the symptoms. She is describing or mostly associated with peripheral neuropathy, which is most likely related to her diabetes and poor glycemic control. We recommend continuing her current medications, including the Lyrica, and   reevaluating her symptoms once we achieve better glycemic control. If her symptoms persist in spite of good glycemic control , we will then refer her 2 pain management for further assistance with this problem. Verbally acknowledged this information.  Medications Added to Medication List This Visit: 1)  Lantus 100 Unit/ml Soln (Insulin glargine) .... 30 units qhs 2)  Novolog Penfill 100 Unit/ml Soln (Insulin aspart) .Marland Kitchen.. 10 u subcutaneously three times a day before meals plus additional units per sliding scale.  Other Orders: Future Orders: T-Hepatitis C Viral Load ([id number]) ... 03/19/2011  Patient Instructions: 1)  Stop Lantus Insulinb 10 U Subcutaneously two times a day 2)  Start Lantus 30U Subcutaneously at bedtime  3)  !0  U Novolog Insulin subcutaneously three times a day ac PLUS 1 U Subcutaneously for every 30mg /dl accucheck blood sugar over 100 mg/dl ac (example: if before meal accucheck is 250 mg/dl, then 5 additional units plus the 10 U should be given = 15U ac) 4)  We will schedulke an Endocrinology consult to further evaluate your diabetes. 5)  We will schedule a diabetic teaching nconsult. 6)  If you do not get an Endocriunology or Diabetic Clinic appointment within 4 weeks, schedule a follow-up visit with [place] for 4 weeks. 7)  Otherwise, please schedule a follow-up appointment in 3 months. 8)  Be sure to return for lab work one (1) week before your next appointment as scheduled. 9)   Advised not to eat any food or drink any liquids after 10 PM the night before labs. 10)  Call if Accuchecks remain >100mg /dl in 7 days or she is experiencing episodic hypoglycemia.   Orders Added: 1)  Est. Patient Level IV [16109] 2)  T-Hgb A1C (in-house) [83036QW] 3)  T-CBC w/Diff [[id number]] 4)  T-CD4SP ([facility]) [CD4SP] 5)  T-Comprehensive Metabolic Panel [[id number]] 6)  T-HIV Viral Load [phone] 7)  T-Lipid Profile [[id number]] 8)  T-Hepatitis C Viral Load [[id number]] 9)  T-Hgb A1C (in-house) [13086VH] 10)  Diabetic Clinic Referral [Diabetic] 11)  Endocrinology Referral [Endocrine]        Diabetes Self Management Training Referral  Patient Name: [name] Date Of Birth: 05/11/1956 MRN: [id number] Current Diagnosis:  OTHER CHRONIC PAIN (ICD-338.29) CHRONIC PANCREATITIS (ICD-577.1) THORACIC/LUMBOSACRAL NEURITIS/RADICULITIS UNSPEC (ICD-724.4) GERD (ICD-530.81) 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

## 2011-04-17 ENCOUNTER — Encounter: Payer: Medicaid Other | Admitting: Internal Medicine

## 2011-04-21 NOTE — Consult Note (Signed)
[name], [name]                ACCOUNT NO.:  [id number]   MEDICAL RECORD NO.:  [id number]          PATIENT TYPE:  IPS   LOCATION:  0503                          FACILITY:  [facility]   PHYSICIAN:  Michelene Gardener, MD    DATE OF BIRTH:  08-May-1956   DATE OF CONSULTATION:  06/29/2007  DATE OF DISCHARGE:                                 CONSULTATION   PRIMARY CARE PHYSICIAN:  This patient has been followed at [facility].   REASON FOR CONSULTATION:  Uncontrolled diabetes.   HISTORY OF PRESENT ILLNESS:  This is a 55 year old African-American  female with a past medical history of multiple medical problems,  including diabetes mellitus, who was admitted to behavioral medicine for  evaluation of depression and alcohol abuse.  Consultation was called  today for evaluation of uncontrolled diabetes.  Going back to the  history on this patient, this patient has multiple admissions with  uncontrolled diabetes.  The last admission was done on March 02, 2007.  At that time, the patient was admitted with diabetic ketoacidosis, was  stabilized in the hospital, and was sent home on March 05, 2007.  At  that time, the patient was given Lantus 55 units to be taken daily, to  take Novolog as sliding scale.   Patient is a very poor historian and is unreliable.  She states that she  has been taking 70 units of Lantus, but she is not very sure about that.  Currently, she looks depressed.  She denies any symptoms related to the  diabetes.  Denied nausea.  Denied vomiting.  There is no blurred vision.  There is no slurred speech.  No chest pain.  No shortness of breath.  Her last CBG showed readings of 266 in the morning and 233 at 12 in the  afternoon.  The readings from yesterday are above 400 and those readings  improved after introduction of NovoLog with meals.   PAST MEDICAL HISTORY:  1. Diabetes mellitus with multiple admissions for DKA, mainly because      patient is not compliant with her  medications.  2. HIV positive.  This was diagnosed in November, 2007.  Her last CD4      was 450.  Again, the patient is very noncompliant.  She has been      followed by ID.  3. History of hepatitis C.  4. History of crack cocaine abuse.  5. History of alcohol abuse.  6. Depression.   PAST SURGICAL HISTORY:  Denied.   ALLERGIES:  No known drug allergies.   CURRENT MEDICATIONS:  1. NovoLog 10 units subcutaneously with meals 3 times a day.  2. Lantus 46 units at bedtime.  3. Thiamine 100 mg p.o. once daily.  4. Dulcolax 5 mg at bedtime.  5. Ambien as needed.   SOCIAL HISTORY:  Patient is divorced and unemployed.  She smokes around  1-2 cigarettes a day.  She drinks 1-2 beers a day.  She uses crack  cocaine occasionally.   FAMILY HISTORY:  Her mother is deceased with a history of alcohol abuse.  Her father  is also deceased and has a history of alcohol abuse.   REVIEW OF SYSTEMS:  CONSTITUTIONAL:  Positive for fatigueability.  There  is no fever.  EYES:  No blurred vision.  ENT:  No tinnitus.  RESPIRATORY:  No cough.  No wheezes.  CARDIOVASCULAR:  No chest pain.  No shortness of breath.  GI:  No nausea, vomiting, or diarrhea.  GU:  No  dysuria.  No hematuria.  ENDOCRINE:  No pyuria.  No nocturia.  HEMATOLOGY:  No bleeding.  No pus.  ID:  No rash or lesions.  NEURO:  No  numbness or tingling.  The rest of the systems are reviewed and are  negative.   PHYSICAL EXAMINATION:  VITAL SIGNS:  Temperature 98.5, pulse 119,  respiratory rate 20, blood pressure 139/98.  GENERAL APPEARANCE:  This is a thin African-American female who is alert  and oriented in no acute distress.  HEENT:  Conjunctivae showed no pallor, no erythema.  Pupils are equal,  round and reactive to light and accommodation.  There is no ptosis.  Hearing is intact.  There is no ear discharge or infection.  There is no  nose discharge, no infection or bleeding.  Oral mucosa is dry.  No  pharyngeal erythema.  NECK:   Supple.  No JVD.  No carotid bruit.  No lymphadenopathy.  No  thyroid enlargement or thyroid tenderness.  CARDIOVASCULAR:  S1 and S2 are regular.  There are no murmurs, gallops,  or thrills.  RESPIRATORY:  Patient has breathing between 16-18.  There is no use of  accessory muscles.  No intercostal retractions.  No rales, rhonchi or  wheezes.  ABDOMEN:  Abdomen is soft, not distended.  There is no evidence of  hepatosplenomegaly.  Bowel sounds are normal.  Umbilicus central.  LOWER EXTREMITIES:  No edema, no rash, no varicose veins.  SKIN:  No rash, no erythema.  NEURO:  Cranial nerves are intact II-XII.  There are no motor or sensory  deficits.   LAB RESULTS:  CD4 done on July 22 was 470.   IMPRESSION/ASSESSMENT:  Uncontrolled diabetes:  This is most likely  secondary to noncompliance.  This patient's blood sugar was high when  she was taking Lantus by itself.  NovoLog was introduced yesterday, to  be taken three times daily.  Normally, Lantus should be given in  combination with short-acting insulin.  So the combination between  Lantus and NovoLog should be a good choice.  I will go ahead and  increase her Lantus to 55 units a day, and the first dose should be  given tonight.  I will also increase her NovoLog to 12 units three times  daily.  The aim should be to increase her Lantus and to decrease her  NovoLog to around 5 units per meal.   DISPOSITION:  I will continue to follow this patient during the  hospitalization to get optimal sugar control.  Thank you so much for  introducing me to this nice lady.   Total assessment time is one hour.      Michelene Gardener, MD  Electronically Signed     NAE/MEDQ  D:  06/29/2007  T:  06/29/2007  Job:  [id number]

## 2011-04-21 NOTE — Procedures (Signed)
VASCULAR LAB EXAM   INDICATION:  Bilateral leg claudication.   HISTORY:  Diabetes:  Yes.  Cardiac:  No.  Hypertension:  No.   EXAM:  Duplex of bilateral lower extremity arteries.   IMPRESSION:  1. Bilateral common femoral artery, superficial femoral artery, and      popliteal artery appear to be within normal flow (biphasic and      velocities).  2. 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   ___________________________________________  Quita Skye Hart [place], M.D.   MG/MEDQ  D:  04/24/2008  T:  04/24/2008  Job:  [id number]

## 2011-04-21 NOTE — Assessment & Plan Note (Signed)
OFFICE VISIT   [name], [name]  DOB:  23-Oct-1956                                       04/24/2008  EGBTD#:[id number]   The patient previously underwent a right carotid endarterectomy by me in  February of 2007 following a right brain TIA.  She has had no recurrent  symptoms and was being seen today for what she describes as severe  bilateral calf claudication symptoms.  She states she is unable to  ambulate more than 50 to 100 yards before stopping because both calves  become quite tight.  She has no rest pain or history of nonhealing  ulcers, but is severely limited by this.  She has been taking Trental  with minimal improvement.  She denies any chest pain, dyspnea on  exertion, PND, or orthopnea.   PHYSICAL EXAM:  Today blood pressure 120/80, heart rate 110,  respirations are 12.  She is a thin, slightly built female who is in no  apparent distress.  She is alert and oriented x3.  Neck is supple 3+  carotid pulses and soft bruit on the left.  Chest:  Clear to  auscultation.  Cardiovascular:  Regular rhythm with no murmurs.  Abdomen  is soft, nontender with no masses.  She has 3+ femoral pulses and 2+  popliteal pulses bilaterally.  On the right side, she has dorsalis pedis  at 2+, left side has posterior tibial at 1 to 2+.  Both feet are  adequately perfused.  No evidence of ischemia.   Lower extremity Dopplers were performed.  ABIs of 0.7 bilaterally, and a  duplex scanning of her legs revealed no evidence of any obvious  superficial femoral popliteal stenotic lesions, but she does have tibial  disease with some high-grade stenoses.   She was adamant about further evaluation including angiography and  possible PTA and stenting because these symptoms are so severe for her.  We will perform an angiogram on June 16th by Dr. Myra Gianotti to see if any  intervention is indicated for this severe claudication.   Quita Skye Hart [place], M.D.  Electronically Signed   [name]  D:  04/24/2008  T:  04/25/2008  Job:  [id number]

## 2011-04-21 NOTE — Procedures (Signed)
LOWER EXTREMITY ARTERIAL EVALUATION-SINGLE LEVEL   INDICATION:  Three months of bilateral claudication.   HISTORY:  Diabetes:  Insulin-dependent.  Cardiac:  No.  Hypertension:  No.  Smoking:  Three cigarettes per day.  Previous Surgery:  No.   RESTING SYSTOLIC PRESSURES: (ABI)                          RIGHT                LEFT  Brachial:               138                  140  Anterior tibial:        98 (0.70)            86  Posterior tibial:       56                   100 (0.71)  Peroneal:  DOPPLER WAVEFORM ANALYSIS:  Anterior tibial:        Biphasic             Brisk monophasic  Posterior tibial:       Brisk monophasic     Biphasic  Peroneal:   PREVIOUS ABI'S:  Date:  RIGHT:  LEFT:   IMPRESSION:  Findings suggest mild to moderate lower extremity arterial  occlusive disease bilaterally.   ___________________________________________  Quita Skye Hart [place], M.D.   MC/MEDQ  D:  04/17/2008  T:  04/17/2008  Job:  [id number]

## 2011-04-21 NOTE — Op Note (Signed)
[name], [name]                ACCOUNT NO.:  [id number]   MEDICAL RECORD NO.:  [id number]          PATIENT TYPE:  AMB   LOCATION:  SDS                          FACILITY:  [facility]   PHYSICIAN:  Juleen China IV, MDDATE OF BIRTH:  15-May-1956   DATE OF PROCEDURE:  05/22/2008  DATE OF DISCHARGE:  05/22/2008                               OPERATIVE REPORT   PREOPERATIVE DIAGNOSIS:  Bilateral claudication.   POSTOPERATIVE DIAGNOSIS:  Bilateral claudication.   PROCEDURE PERFORMED:  1. Ultrasound-guided access of left common femoral artery.  2. Abdominal aortogram.  3. Catheter in aorta.  4. Bilateral lower extremity runoff.   COMPLICATIONS:  None.   FINDINGS:  Tibial disease.   PROCEDURE:  The patient identified in the holding area and taken to room  eight.  She was placed supine on the table.  Bilateral groins were  prepped and draped in a standard sterile fashion.  The left common  femoral artery was evaluated with ultrasound and found to be widely  patent.  A 1% lidocaine was used for local anesthesia.  Using 1%  lidocaine, the skin of subcutaneous tissue was anesthetized.  With  ultrasound, the left common femoral artery was accessed with 18-gauze  needle and 0.35 Benson wire was advanced in a retrograde fashion to the  abdominal aorta under fluoroscopic visualization.  Next, a 5-French  sheath was placed over the wire and Omni flush catheter was placed at  level L1/L2 and an abdominal aortogram was obtained.  Next, catheter was  pulled down the aortic bifurcation.  Bilateral lower extremity runoff  was obtained.   FINDINGS:  Aortogram:  The visualized portions of the suprarenal  abdominal aorta showed minimal disease.  The catheter migrated distally  and therefore, full visualization of the suprarenal aorta was limited.  There are single renal arteries, which were widely patent.  The  infrarenal abdominal aorta shows minimal disease.   Pelvic Angiogram:  Right common  iliac, right external iliac, and right  hypogastric artery is widely patent with minimal disease.  The left  common iliac, the left external iliac, and the left internal iliac were  widely patent with minimal disease.   Right Lower Extremity:  The right common femoral artery is widely patent  with minimal disease.  The right profunda femoral artery is widely  patent.  The right superficial femoral artery is widely patent.  There  is mild disease in the adductor canal.  The right popliteal artery is  widely patent.  The patient has several areas of stenosis within her  anterior tibial artery, which is patent down across the ankle.  There is  stenosis within the tibial-peroneal trunk as well as at the origin of  the posterior tibial and peroneal artery.  The peroneal artery has  several areas of stenosis.  The posterior tibial artery crosses the  ankle.   Left Lower Extremity:  The left common femoral artery is widely patent  with minimal disease.  The left profunda femoral artery is patent with  minimal disease.  The left superficial femoral artery is patent  throughout this  course with minimal disease.  The left popliteal artery  is patent throughout this course.  The anterior tibial artery is patent  down across the ankle.  There is diffuse disease throughout this course.  There is an area of stenosis at the origin of the posterior tibial  artery.  It is small in caliber.  The posterior tibial artery is patent  across the ankle.  The peroneal artery is patent throughout this course  with several areas of stenosis.   After the above images were obtained, catheters and wires were removed.  The patient was taken to holding area for sheath pull.   IMPRESSION:  1. Widely patent aortoiliac system.  2. Widely patent superficial femoral and popliteal arteries.  3. Diffuse tibial disease bilaterally with three-vessel runoff.           ______________________________  V. Charlena Cross,  MD  Electronically Signed     VWB/MEDQ  D:  05/22/2008  T:  05/23/2008  Job:  [id number]

## 2011-04-21 NOTE — Assessment & Plan Note (Signed)
OFFICE VISIT   [name], [name]  DOB:  1956/11/16                                       06/26/2008  DDUKG#:[id number]   The patient had an abdominal aortogram with bilateral lower extremity  runoff performed by Dr. Myra Gianotti on June 16 of this year for severe  bilateral calf claudication symptoms.  The patient states that her  symptoms are severe and she is unable to walk more than about 1/4 block  and then has to rest for 5 minutes because of the discomfort.  She has  no numbness in the feet and has had no history of infection, gangrene or  rest pain.  Unfortunately, her angiogram revealed widely patent vessels  down to the popliteal level with diffuse tibial disease bilaterally,  which is consistent with her diabetes.  She was in the hopes that we  could performed some type of procedure to relieve her symptoms, but I  explained to her at length, there is nothing to be done that would  improve this other than to try to ambulate as much as possible and  develop collaterals.  She denies any neurologic symptoms such as  hemiparesis, aphasia, amaurosis fugax, diplopia, blurred vision or  syncope, having previously undergone a right carotid endarterectomy by  me in 2007.  Left internal carotid has been followed and has a mild to  moderate stenosis which is asymptomatic.   EXAM:  Today blood pressure 112/80, heart rate 118, respirations 12.  Carotid pulses 3+ with no audible bruits.  Neurologic:  Normal.  No  palpable adenopathy in the neck.  Chest:  Clear to auscultation.  Cardiovascular:  Regular rhythm with no murmurs.  Abdomen is soft,  nontender with no palpable masses.  3+ femoral and 2+ popliteal pulses  are palpable bilaterally with a 2+ right dorsalis pedis and 1+ left  posterior tibial pulse palpable.   She was reassured regarding these findings and will continue to be  followed on a regular basis on the carotid protocol for her moderate  left internal  carotid stenosis.   [name], M.D.  Electronically Signed   [name]  D:  06/26/2008  T:  06/27/2008  Job:  [id number]

## 2011-04-21 NOTE — Procedures (Signed)
CAROTID DUPLEX EXAM   INDICATION:  Follow-up evaluation of known carotid artery disease.   HISTORY:  Diabetes:  Insulin-dependent.  Cardiac:  No.  Hypertension:  Yes.  Smoking:  Three cigarettes per day.  Previous Surgery:  Right carotid endarterectomy with Dacron patch  angioplasty on 01/13/06.  CV History:  Previous duplex performed on 01/07/06 (preoperative)  revealed a >80% right ICA stenosis and a 60-80% left ICA stenosis.  Amaurosis Fugax No, Paresthesias No, Hemiparesis No                                       RIGHT             LEFT  Brachial systolic pressure:         138               140  Brachial Doppler waveforms:         Triphasic         Triphasic  Vertebral direction of flow:        Antegrade         Antegrade  DUPLEX VELOCITIES (cm/sec)  CCA peak systolic                   63                67  ECA peak systolic                   59                64  ICA peak systolic                   33                137  ICA end diastolic                   14                42  PLAQUE MORPHOLOGY:                  None              Soft  PLAQUE AMOUNT:                      None              Mild-to-moderate  PLAQUE LOCATION:                    None              Proximal ICA   IMPRESSION:  1. Small anechoic structures seen in the thyroid on the right side,      measuring 0.69 X 0.99 cm.  2. No right internal carotid artery stenosis, status post      endarterectomy.  3. 40-59% left internal carotid artery stenosis.   ___________________________________________  Quita Skye Hart [place], M.D.   MC/MEDQ  D:  04/17/2008  T:  04/17/2008  Job:  [id number]

## 2011-04-24 NOTE — Discharge Summary (Signed)
[name], [name]                ACCOUNT NO.:  [id number]   MEDICAL RECORD NO.:  [id number]          PATIENT TYPE:  INP   LOCATION:  5713                         FACILITY:  [facility]   PHYSICIAN:  Theone Stanley, MD   DATE OF BIRTH:  30-Nov-1956   DATE OF ADMISSION:  10/16/2006  DATE OF DISCHARGE:  10/23/2006                               DISCHARGE SUMMARY   ADMITTING DIAGNOSIS:  1. Fatigue.  2. DKA.  3. History of bronchitis.  4. History of TIA with carotid endarterectomy.  5. History of hepatitis C.  6. Chronic pancreatitis.  7. Crack cocaine use.  8. Alcohol use.   DISCHARGE DIAGNOSES:  1. DKA, resolved, secondary to noncompliance.  2. HIV positive.  3. History of hypertension.  4. Substance abuse.  5. Cervical erosions, will need followup as an outpatient.  6. Trichomonas.  7. Bacterial vaginitis.  8. History of Hep C.  9. Malnourished/underweight.  BMI of 15.8.  10.Left forearm skin infection, possible cellulitis.   CONSULTATIONS:  None.   PROCEDURES/DIAGNOSTIC TESTS:  The patient did have a cervical  examination which showed cervical erosions.  Patient was positive for  trichomonas, bacterial vaginitis. Chlamydia was nonreactive.  Gonorrhea  was nonreactive.  Other pertinent labs, including negative RPR.  Hemoglobin A1c 16.5.  CD4 was 140.  T-helper percent 25.  HIV was  initially positive.  HIV1 antibody Western blot was positive.  HIV2  antibodies IB was indeterminate.   HOSPITAL COURSE:  [name] is a 55 year old African American woman  who presented to the hospital on November 10 secondary to not feeling  well over the three days prior to admission.  Upon evaluation it was  noted that the patient was in DKA.  She was placed on an insulin drip  and admitted to the ICU.  Over the next couple of days, patient's  condition improved.  She was placed back on her insulin 70/30, still  somewhat uncontrolled.  She was placed on glipizide and her sugars were  better controlled with that.  During her stay, multiple tests were  performed, including chlamydia and gonorrhea, which were negative.  RPR  was negative.  HIV was tested, which was positive.  It was confirmed  with a Western blot.  The case was discussed with Infectious Disease and  she was given a hepatitis A vaccine.  HIV RNA was also sent.  The  patient was informed of her HIV status.  This was very obviously  upsetting to her, but hopefully this will help her turn around and  address her health issues since she has a history of noncompliance.  She  was also informed of her cervical erosions and the need for followup.  Her last CBC was 3.7, hemoglobin 11, hematocrit 32, platelets at 203.  Patient was doing much better by the time she was leaving.  Of note, she  had erythema and pain in the left forearm.  I am not sure if this was  from an IV; however, she was given one dose of vancomycin and subsequent  Unasyn and switched to Bactrim.  Her  erythema and pain resolved.  Patient left in stable condition.   DISPOSITION:  Home.   DISCHARGE MEDICATIONS:  1. Bactrim DS one p.o. b.i.d. x7 days, then one p.o. daily until      Infectious Disease determines to stop this.  2. Flagyl 500 one p.o. b.i.d. for an additional three days.  3. Insulin, NovoLog 70/30, 40 units subcutaneous b.i.d.  4. Glipizide 10 mg one p.o. daily.  5. Protonix 40 mg one p.o. daily.  6. Prozac at home dosage.  7. Multivitamin one p.o. daily.  8. Thiamine 100 mg one p.o. daily.   FOLLOW UP:  Patient is to follow up with [facility] in 1-2 weeks,  infectious disease clinic with Dr. Ninetta Lights in 1-2 weeks.  She was given  the phone number of the clinic at [phone].  She is to follow up with  the OB/GYN resident clinic.  She was given the number of [phone] and  she is to follow up in 2-3 weeks.  Patient was instructed to check  fingerstick blood sugars, a.c. and bedtime.      Theone Stanley, MD  Electronically  Signed     AEJ/MEDQ  D:  10/23/2006  T:  10/24/2006  Job:  [id number]   cc:   Pearson Forster C. Ninetta Lights, M.D.  OB/GYN clinic  Resident's clinic

## 2011-04-24 NOTE — Discharge Summary (Signed)
NAMENIKIE, CID                ACCOUNT NO.:  [id number]   MEDICAL RECORD NO.:  [id number]          PATIENT TYPE:  INP   LOCATION:  5524                         FACILITY:  [facility]   PHYSICIAN:  Hettie Holstein, D.O.    DATE OF BIRTH:  [birth year]   DATE OF ADMISSION:  11/15/2006  DATE OF DISCHARGE:  11/18/2006                               DISCHARGE SUMMARY   PRIMARY CARE PHYSICIAN:  [facility].   INFECTIOUS DISEASE PHYSICIAN:  Lacretia Leigh. Ninetta Lights, M.D. at the  infectious disease clinic.   FINAL DIAGNOSIS:  Diabetic ketoacidosis.   ADDITIONAL DIAGNOSES:  1. Human immunodeficiency virus positive.  2. Hepatitis C.  3. History of substance abuse.  4. Hypokalemia.  5. Bronchitis.  6. Peripheral vascular disease.  7. History of transient ischemic attacks.  8. Cardiomyopathy with ejection fraction of 50%.   MEDICATIONS ON DISCHARGE:  Patient is instructed to use Lantus 22 units  subcu q.h.s. and then increase by one unit each night until her a.m.  glucose is less than 100 and if less than 60, she is to follow  hypoglycemic protocol and decrease her Lantus in the evening by 5 units.  This was to replace her 70/30 and it was emphasized that Mrs. Lamprecht  would need outpatient continuity to achieve more optimal control of her  diabetes.   HISTORY OF PRESENT ILLNESS:  For full details please refer to the H&P as  dictated by Dr. Gasper Sells. Briefly, Mrs. Broxterman presented to the  emergency department in diabetic ketoacidosis with medical history of  HIV diagnosed in November of 2007.  Hepatitis C, crack abuse, alcohol  abuse and peripheral vascular disease who provided little history in the  emergency department was noted to have anion gap acidosis.   HOSPITAL COURSE:  Mrs. Mcclaskey was admitted and treated for her diabetic  ketoacidosis and experienced anion gap closure and resolution of  symptoms. Initially placed on a step down unit though experienced prompt  anion gap closure.  In any  event, the dominance of her hospital course  has been that of psychosocial management including alignment for  outpatient continuity and simplification of Lantus or insulin regimen  that would be more conducive to compliance.  It was felt that single  nightly dosing would allow her to at least achieve some degree of  outpatient stability with further refinement be achieved with further  outpatient continuity.   DISPOSITION:  Mrs. Kenney was felt to be medically stable for discharge  and follow up for infectious disease in clinic.  Caseworker was notified  and infectious disease clinic who stated that they would call her to  establish a follow up appointment.      Hettie Holstein, D.O.  Electronically Signed     ESS/MEDQ  D:  01/24/2007  T:  01/25/2007  Job:  [id number]

## 2011-04-24 NOTE — H&P (Signed)
NAMEMALAYJA, FREUND                ACCOUNT NO.:  [id number]   MEDICAL RECORD NO.:  [id number]          PATIENT TYPE:  INP   LOCATION:  2923                         FACILITY:  [facility]   PHYSICIAN:  Pearlean Brownie, M.D.DATE OF BIRTH:  09-28-1956   DATE OF ADMISSION:  01/06/2006  DATE OF DISCHARGE:                                HISTORY & PHYSICAL   HISTORY OF PRESENT ILLNESS:  This is a 55 year old African-American female  with type 2 insulin-requiring diabetes mellitus, hypertension, chronic  pancreatitis, hepatitis C, and polysubstance abuse, who presents with a 1-  week history of vomiting, not blood streaked, non-bilious, clear material,  approximately 3 times per day.  This was associated with watery stools, not  bloody, not mucoid.  The patient ran out of insulin for a week.  She also  developed polydipsia, polyuria, and nocturia and felt thirsty all the time.  On the day prior to admission, the patient developed epigastric pain which  was sharp with a severity of 8/10 lasting 3 hours, nonradiating.  This was  aggravated by movement and not relieved by rest.  She also complained of  shortness of breath, palpitations, transient numbness and weakness of the  left arm which lasted 1-1/2 days.  There is no history of fever, cough,  dysuria.  Because of the progressive weakness, she was transferred to the  [facility] ED.   PAST MEDICAL HISTORY:  1.  Diabetes mellitus, insulin requiring, diagnosed in 2006 with previous      admission for DKA in September 2006.  2.  Hypertension.  3.  Chronic calcific pancreatitis.  4.  Hepatitis B and C.  5.  Anemia.  6.  Polysubstance abuse.   MEDICATIONS:  1.  NPH 70/30, 22 units in the morning and 18 units at bedtime.  2.  Lisinopril 10 mg daily.  3.  Aspirin and Motrin p.r.n. for headache.   ALLERGIES:  No known drug allergies.   FAMILY HISTORY:  No history of diabetes, CAD, hypertension, or stroke.  There is history of asthma in the  family.   SOCIAL HISTORY:  The patient lives with girlfriend, used to work in a [employer].  She is presently unemployed.  She drinks 1/2 cup of beer  3 times a week.  Has been smoking about 1/2 pack per day for the past 38  years.  Admits to using cocaine with last alcohol and cocaine use 3 days  ago.   REVIEW OF SYSTEMS:  No history of fever, cough, dysuria, exertional dyspnea.  Positive for epigastric pain, vomiting, diarrhea, shortness of breath,  dizziness, weakness, and undocumented weight loss.   PHYSICAL EXAMINATION:  VITAL SIGNS:  Blood pressure 156/109, heart rate 130,  respiratory rate 22, temperature 97.6, O2 saturation 100% on room air.  GENERAL: The patient is cachectic looking.  Alert, coherent, oriented, not  in cardiopulmonary distress.  HEENT:  Normocephalic.  Clear conjunctivae, anicteric sclerae.  Pupils  equal, round, and reactive to light bilaterally. Dry oral mucosa with no  oral ulcers or sores. No tonsillar or oropharyngeal congestion.  NECK:  No  cervical lymphadenopathy, no thyromegaly, no carotid bruits.  CARDIOVASCULAR:  Adynamic precordium.  Tachycardic.  No murmurs.  RESPIRATORY: No increased work of breathing. Clear to auscultation  bilaterally.  No crackles, no wheezes.  ABDOMEN:  Normal bowel sounds.  Flat, nondistended, soft.  Direct tenderness  on palpation of the epigastrium and right upper quadrant areas.  Negative  Murphy's sign. No rebound tenderness, no guarding.  EXTREMITIES:  No cyanosis or edema.  Pedal pulses 2+ bilaterally.  NEUROLOGIC: Cranial nerves II-XII intact.  Motor strength 5/5 in both upper  and lower extremities.  No pronator drift.  Sensory intact.  Reflexes 2+  bilaterally and symmetric.  Cerebellar: Fair finger-to-nose test.   LABORATORY DATA:  On admission, hemoglobin 13.4, hematocrit 39.9, WBC 6.7,  platelets 176.  Sodium 133, potassium 4.1, chloride 104, CO2 7.9, BUN 13,  creatinine 0.7, glucose 316.  ABG  I-STAT showed pH of 7.290, pCO2 of 19.8,  bicarb 9.3, SGOT 377, SGPT 207, total bilirubin 2.3, indirect bilirubin 2.1,  direct bilirubin 0.2, alkaline phosphatase normal.  Lipase 33. Acetone  large.  Serum alcohol level less than 5.  Urinalysis showed glucose of more  than 1000, ketones more than 80.  Urine drug screen positive for cocaine and  opiates.   Chest x-ray showed normal findings.   Abdominal x-ray showed no evidence of small-bowel obstruction nor free air,  and there is note of chronic calcific pancreatitis.   ASSESSMENT:  This is a 55 year old African-American female with diabetes  mellitus, hypertension, admitted in diabetic ketoacidosis.  1.  Diabetic ketoacidosis: The patient presented with hyperglycemia,      acidosis, and low bicarbonate with an anion gap of 21.  Precipitating      factors include infection, specifically gastroenteritis, noncompliance      with insulin regimen, pancreatitis, alcohol abuse, and dehydration.  We      will give normal saline solution to correct volume deficit estimated to      be around 3 to 6 liters.  Will change IV fluids to one-half normal      saline.  If corrected sodium is normal, will elevate it.  Insulin drip      per Glucomander with goal CBG about 50 to 70 per hour.  Monitor CBG      early.  Will check magnesium phosphate and monitor BMET regularly and      correct any abnormalities.  Expect hypokalemia with administration of      insulin therapy.  Once CBG is less than or equal to 250, will change D5      containing fluids to prevent further ketosis with transition to      subcutaneous insulin once CBGs less than or equal to 250 and anion gap      closed and patient is taking good p.o.  Will check hemoglobin A1c.  2.  Epigastric and right upper quadrant pain: The differential diagnoses      include gastritis, peptic ulcer disease, acute pancreatitis,     cholecystitis, cholelithiasis, hepatitis.  Concern for acute coronary       syndrome, especially with diabetes.  Will do CMET, lipase, amylase, TSH,      and cycle cardiac enzymes to rule out an acute coronary event.  We will      do plain abdominal x-ray to rule out perforated viscus.  The patient has      a known history of hepatitis C infection. Will do abdominal ultrasound      to rule  out cholecystitis or cholelithiasis as the cause of the right      upper quadrant pain.  3.  Vomiting and diarrhea:  This is probably secondary to gastroenteritis.      Will guaiac vomitus and fluids, check stools, WBC, and cultures,  Give      Phenergan and Zosyn p.r.n. for vomiting.  4.  Elevated transaminase: This is probably secondary to hepatitis C      infection and alcohol abuse.  Will monitor closely.  Will check      abdominal ultrasound.  5.  Hepatitis C: The patient is at high risk for other sexually transmitted      diseases.  We will check HIV, RPR, GC, and chlamydia.  Will need      referral to the infectious disease clinic.  6.  Left arm numbness and weakness: Per history, the patient had transient      left arm numbness and weakness which eventually resolved. Concern for      transient ischemic attack.  Will start aspirin.  We will check carotid      Dopplers.  If EKG is normal, no need for echocardiogram to document      congenital heart defect __________ paradoxical emboli resulting in      stroke.  We will check fasting lipid panel.  7.  Polysubstance abuse: Will need social work consult for drug      rehabilitation and alcohol rehabilitation. The patient interested in      smoking cessation.  We will start nicotine patch prior to discharge.      Presently, the patient does not appear to be agitated.  We will keep      Ativan for possible alcohol withdrawal, give thiamine and multivitamins.  8.  Hypertension: Restart Lisinopril once diabetic ketoacidosis resolves.      Monitor blood pressure closely.  9.  Fluid, electrolytes, and nutrition.  Normal saline 1  liter per hour x2      followed by 500 mL per hour x4, then 250 mL per hour.  Will change to D5      containing fluid once the CBG is less than or equal to 250 and add      potassium supplementation.  N.P.O. temporarily.  10. Gastrointestinal prophylaxis: Start Protonix 40 mg IV daily.  11. Deep vein thrombosis prophylaxis: STDs.  12. Social: Will get social work consult for alcohol and drug      rehabilitation.      Lawerance Sabal, MD    ______________________________  Pearlean Brownie, M.D.    MC/MEDQ  D:  01/06/2006  T:  01/06/2006  Job:  [id number]   cc:   Dr. Emeline Darling, [place]ala Dock

## 2011-04-24 NOTE — Consult Note (Signed)
[name], [name]                ACCOUNT NO.:  [id number]   MEDICAL RECORD NO.:  [id number]          PATIENT TYPE:  INP   LOCATION:  5504                         FACILITY:  [facility]   PHYSICIAN:  Quita Skye. Hart Rochester, M.D.  DATE OF BIRTH:  Nov 23, 1956   DATE OF CONSULTATION:  01/08/2006  DATE OF DISCHARGE:                                   CONSULTATION   CHIEF COMPLAINT:  Recent right brain TIA with severe right carotid occlusive  disease.   HISTORY OF PRESENT ILLNESS:  This 55 year old African-American female with  type 2 diabetes mellitus was admitted on January 31 for diabetic  ketoacidosis.  The patient had a history of vomiting with watery diarrhea  and had not been taking her insulin for one week.  She also had had dyspnea  and palpitations and some transient numbness and weakness in the left arm,  which lasted one to two days about one week ago.  She had no history of  fever, cough or dysuria.  Her diabetic ketoacidosis has cleared while in the  hospital over the past 48 hours and carotid duplex exam was performed, which  revealed a 90% right internal carotid stenosis and a 50-60% left internal  carotid stenosis.  She has had no previous history of stroke, TIAs,  amaurosis fugax, diplopia, blurred vision or syncope.   PAST MEDICAL HISTORY:  1.  Diabetes mellitus.  2.  Hypertension.  3.  Chronic calcific pancreatitis.  4.  Hepatitis B and C.  5.  Anemia.  6.  Polysubstance abuse.   Negative for coronary artery disease, deep venous thrombosis or pulmonary  emboli.   MEDICATIONS:  1.  NPH 70/30, 20 units in the morning and 18 units at bedtime.  2.  Lisinopril 10 mg a day.  3.  Aspirin and Motrin p.r.n. for headache.   ALLERGIES:  None known.   FAMILY HISTORY:  Negative for diabetes, coronary artery disease and stroke.   SOCIAL HISTORY:  The patient lives with a girlfriend, presently is  unemployed.  Drinks half a cup of beer three times a week.  Smokes half-pack  a day  for the past 38 years.  Admits to using cocaine with last alcohol and  cocaine use three days ago.   PHYSICAL EXAMINATION:  VITAL SIGNS:  Blood pressure 150/90, heart rate 100,  respirations 18.  GENERAL:  She is a chronically ill-appearing, thin female who is cachectic  in appearance.  She is alert and oriented x3.  NECK:  Supple with 3+ carotid pulses palpable.  No palpable adenopathy in  the neck.  Good upper extremity pulses 3+ bilaterally.  CHEST:  Clear to auscultation.  CARDIOVASCULAR:  Regular rhythm.  No murmurs.  ABDOMEN:  Soft, nontender with no masses.  She has 3+ femoral, popliteal and  dorsalis pedis pulses bilaterally. NEUROLOGIC:  Unremarkable.   IMPRESSION:  Right hemispheric transient ischemic attack recently, with  severe right internal carotid stenosis (greater than 90%).   RECOMMENDATIONS:  The patient needs a right carotid endarterectomy for  prevention of stroke.  She has no cardiac history but does have  atypical  chest discomfort at times.  Would recommend a cardiology consult to see if  any further testing is needed for cardiac clearance prior to her right  carotid surgery, which could be done early next week           ______________________________  Quita Skye. Hart Rochester, M.D.     [name]  D:  01/08/2006  T:  01/09/2006  Job:  [id number]   cc:   Pearlean Brownie, M.D.  Fax: [phone]

## 2011-04-24 NOTE — Discharge Summary (Signed)
[name], [name]                ACCOUNT NO.:  [id number]   MEDICAL RECORD NO.:  [id number]          PATIENT TYPE:  INP   LOCATION:  3301                         FACILITY:  [facility]   PHYSICIAN:  Asencion Partridge, M.D.     DATE OF BIRTH:  1956-02-13   DATE OF ADMISSION:  01/06/2006  DATE OF DISCHARGE:  01/14/2006                                 DISCHARGE SUMMARY   DISCHARGE DIAGNOSIS:  1.  Diabetic ketoacidosis.  2.  Insulin dependent diabetes mellitus.  3.  Transient ischemic attack.  4.  Carotid stenosis.  5.  Substance abuse.  6.  Hypertension.  7.  History of hepatitis C.  8.  Chronic pancreatitis.   DISCHARGE MEDICATIONS:  1.  Neurontin 300 mg p.o. t.i.d.  2.  Aspirin 81 mg p.o. daily.  3.  Novolin 70/30, 22 units every morning and 25 units before dinner.  4.  Lisinopril 10 mg p.o. daily.  5.  Lopressor 25 mg p.o. b.i.d.   HISTORY OF PRESENT ILLNESS:  This is a 55 year old female with a history of  insulin dependent diabetes, substance abuse including crack cocaine, who  presented to the emergency department with nausea, vomiting, and generalized  weakness.  Her laboratory data was consistent with DKA.  The patient was  admitted for management of this illness.   ADMISSION LABORATORY DATA:  UDS positive for cocaine and opiates.  White  blood cell count 6.7, hemoglobin 13.4, hematocrit 39.9, platelets 178.  Lipase 33, sodium 135, potassium 3.6, chloride 106, bicarb 10, BUN 11,  creatinine 1.1, blood sugar 227, large serum ketones.  Total bilirubin 2.3,  AST 377, ALT 207.  Alcohol level less than 5.  Cardiac enzymes negative.   HOSPITAL PROCEDURES AND STUDIES:  1.  Right carotid endarterectomy on January 13, 2006.  2.  Myoview stress test, no left ventricular MI or ischemia, decreased left      ventricular ejection fraction at 39%.  3.  Right upper quadrant ultrasound showing borderline gallbladder wall      thickening, no gallstones or ductal dilatation, extensive  pancreatic      calcifications including evidence of ductal dilatation and intraductal      calculi.  Findings consistent with chronic pancreatitis.  4.  Carotid Dopplers with greater than 90% right ICA stenosis, 60-80% left      ICA stenosis.  5.  2D echo with left ventricular ejection fraction 50%, possible      hypokinesis of the septal wall.   HOSPITAL COURSE:  Problem 1:  DKA.  The patient was admitted to the step down unit and  aggressively hydrated with 4 liters of IV fluid overnight and started on the  Glucomander.  She was transitioned off the Glucomander about six hours later  and was given Lantus 10 units about one hour before the Glucomander machine  was stopped.  When her blood sugars were less than 250, she was placed on D5  half normal saline at 100 mL per hour.  We monitored her BNPs every four  hours and her bicarb corrected to normal within 12 hour of admission  and the  patient subjectively felt much better.  We resumed her regular home dose of  Novolin 70/30 on the night following admission and the patient did not have  any further evidence of DKA.   Problem 2:  Insulin dependent diabetes mellitus.  The patient's blood sugars  were not well controlled on her usual home dose of insulin.  They were in  the 300s to 400s, so we increased her insulin to a final dose of 22 units of  70/30 in the morning and 25 units before bedtime.  This achieved blood  sugars in the 200s or so.  She will need to follow up for tighter control of  her blood sugars as an outpatient.  Hemoglobin A1C was markedly elevated at  16.7 showing either brittle diabetes or very poor control and noncompliance.   Problem 3:  TIA.  The patient had some complaints of left upper extremity  weakness and clumsiness on admission.  This lead [place] to pursue a carotid  Doppler study which showed greater than 90% stenosis of her right internal  carotid artery.  We consulted cardiovascular thoracic surgery who   successfully performed a right carotid endarterectomy on the patient.  She  tolerated the procedure well without complications.   Problem 4:  Carotid stenosis.  The patient did well postoperatively from the  carotid endarterectomy.  We discharged her home on 81 mg enteric coated  aspirin to take daily.   Problem 5:  Hypertension.  The patient's blood pressures remained well  controlled throughout her hospitalization on Lisinopril 10 mg daily.  We had  also added Lopressor 25 mg daily given evidence of heart failure on her  echo.   Problem 6:  Substance abuse.  The patient was counseled on the risk of using  cocaine and we recommended that she stop using cocaine.  She was warned of  the possible side-effects that cocaine use could have with the medication  she was on.  The patient said she understood this.   Problem 7:  Hepatitis C.  The patient had elevated liver enzymes here in the  hospital and no evidence of gallbladder dysfunction on right upper quadrant  ultrasound.  Her liver function tests actually trended down during her stay  here and she is no longer complaining of any abdominal pain when she was  discharged.   DISCHARGE LABORATORY DATA:  White blood cell count 11.5, hemoglobin 10.6,  hematocrit 30.9, platelets 233.  Sodium 129, potassium 4.5, chloride 97,  bicarb 28, BUN 5, creatinine 0.5, glucose 159, total bilirubin 0.5, AST 53,  ALT 70, alkaline phos 52.  HIV nonreactive.  Hemoglobin A1C 16.7.  Cholesterol 95, triglycerides 60, HDL 22, LDL 51.   DISCHARGE INSTRUCTIONS:  She is to take all medications as prescribed.  She  is to shower daily and clean her surgical area with soap and water.  She is  not to use any cocaine because it can interact with the medications and  cause serious harm.  She is to follow up with [facility] and call for an  appointment in about one week.  She is to follow up with Dr. Hart Rochester February  27 at 9:30 a.m.     [name],  M.D.    ______________________________  Asencion Partridge, M.D.    KS/MEDQ  D:  01/14/2006  T:  01/14/2006  Job:  [id number]   cc:   [facility]

## 2011-04-24 NOTE — H&P (Signed)
NAMEKAYDYN, CHISM                ACCOUNT NO.:  [id number]   MEDICAL RECORD NO.:  [id number]          PATIENT TYPE:  EMS   LOCATION:  ED                           FACILITY:  [facility]   PHYSICIAN:  Mobolaji B. Bakare, M.D.DATE OF BIRTH:  1956-05-02   DATE OF ADMISSION:  08/18/2005  DATE OF DISCHARGE:                                HISTORY & PHYSICAL   PRIMARY CARE PHYSICIAN:  Unassigned.  The patient goes to [employer] [facility].   CHIEF COMPLAINT:  Weakness, nausea and vomiting for 3 days.   HISTORY OF PRESENTING COMPLAINT:  Ms. Asante is a 55 year old African-  American female who has history of diabetes mellitus.  She uses a tiny p.o.  of which she does not remember the name.  She ran out of her medication  about a week ago.  She started having nausea, vomiting, and weakness about 3  days ago.  This was also accompanied by epigastric pain.  She was noted to  have ketones greater than 80 in her urine, a CO2 of 13.3.  Her blood sugar  was 254 on arrival in the emergency department.   Ms. Benney drinks alcohol, and she last drank 4 days ago in the company of  her friends.  In addition, she uses crack cocaine.  She denies any diarrhea.  She has epigastric pain but no hematemesis, no fever.   REVIEW OF SYSTEMS:  Positive for chronic weight loss over the last 2 years.  She does not know how much weight she has lost.  There is no shortness of  breath, chest pain, headaches, dysuria, or increased frequency of  micturition.  She does have feeling of soreness in her throat.  The patient  has polyuria and increased frequency of micturition.   PAST MEDICAL HISTORY:  1.  Diabetes mellitus diagnosed this year, 2006.  2.  History of pancreatitis 15 years ago.  3.  History of anemia.   CURRENT MEDICATIONS:  Medications unknown. She states that she uses only 1  pill.   ALLERGIES:  No known drug allergies.   FAMILY HISTORY:  Father died at the age of 4 without any known cause.  Mother died at 53  years without any known cause.  She is a widow.  Significant family history of asthma in his mother, sister, and brother.  Mother had throat cancer.   SOCIAL HISTORY:  The patient drinks alcohol every day.  She is vague about  the quantity.  She smokes cigarettes one pack a day and uses crack cocaine.  Last use was 4 days ago.  She is not employed.  She gets food stamps.  She  has a son who has ADHD.   PHYSICAL EXAMINATION:  VITAL SIGNS:  Temperature 98.2, blood pressure  166/103, pulse 109, respiratory rate 20, O2 saturation 99%.  GENERAL:  She appears emaciated, not in any respiratory distress.  HEENT:  Normocephalic and atraumatic head.  Mucous membranes moist.  No  exudates.  NECK:  No lymphadenopathy, no thyromegaly.  LUNGS:  Clear clinically to auscultation.  CARDIOVASCULAR:  Regular S1 and S2.  No murmur,  no gallop.  ABDOMEN:  Not distended.  Soft, mild epigastric tenderness.  No rebound, no  guarding.  Bowel sounds present.  EXTREMITIES:  No pedal edema, no calf tenderness. Dorsalis pedis pulses 2+  bilaterally.  CNS: No focal neurological deficits.  SKIN:  No abnormality.   INITIAL LABORATORY DATA:  White cells 5.7, hemoglobin 13.2, hematocrit 39,  MCV 93, platelets 222, neutrophils 65% and lymphocytes 34%.  This is a  normal differential.  Sodium 130, potassium 3.6, chloride 99, CO2 13,  glucose 254, BUN 6, creatinine 1, total bilirubin 2, AST 151, ALT 102,  alkaline phosphatase 50, total protein 6.8, albumin 3.2, calcium 8.  Anion  gap 18.  Lipase 58.  Urinalysis showed glucose greater than 1000, ketones  greater than 80, protein 100, negative for nitrites and leukocytes.  Microscopy is insignificant.   ASSESSMENT AND PLAN:  Ms. Kehres is a 55 year old African-American female  who presented with weakness, nausea and vomiting, with epigastric pain.  She  has metabolic acidosis with positive ketones in urine.  Constellation of  symptoms suggestive of diabetic  ketoacidosis.  The patient is noncompliant  with medications.   1.  Diabetic ketoacidosis.  2.  Nausea and vomiting.  3.  Alcohol abuse.  4.  Substance abuse.  5.  Tobacco abuse.  6.  Abnormal transaminase thought most likely secondary to alcohol.  7.  Proteinuria.  8.  Hypertension.  9.  Noncompliance.  10. History of pancreatitis.   PLAN:  Will admit and start on insulin therapy with Glucomander, IV normal  saline at 125 mL per hour plus 20 mEq KCl in each liter.  Phenergan 12.5 mg  IV q.4 h. p.r.n. 1 mg .  Will check alcohol level and pH, EKG.   Will start her on CIWA protocol for possible alcohol withdrawal.  Will start Lisinopril 10 mg p.o. daily.   The patient is probably insulin deficient from chronic pancreatitis.  She  will need to start insulin therapy.  Will institute alcohol, tobacco, and  substance abuse counseling.  Check TSH and fasting lipid profile.  Check  BMET q.4 h.  Nicotine patch  21 mg daily.      Mobolaji B. Corky Downs, M.D.  Electronically Signed     MBB/MEDQ  D:  08/18/2005  T:  08/18/2005  Job:  [id number]   cc:   [facility]

## 2011-04-24 NOTE — Op Note (Signed)
NAMESTARLEEN, [name]                ACCOUNT NO.:  [id number]   MEDICAL RECORD NO.:  [id number]          PATIENT TYPE:  INP   LOCATION:  5599                         FACILITY:  [facility]   PHYSICIAN:  Quita Skye. Hart Rochester, M.D.  DATE OF BIRTH:  06-25-1956   DATE OF PROCEDURE:  01/13/2006  DATE OF DISCHARGE:                                 OPERATIVE REPORT   PREOPERATIVE DIAGNOSIS:  Severe right internal carotid stenosis, with right  hemispheric transient ischemic attacks.   POSTOPERATIVE DIAGNOSIS:  Severe right internal carotid stenosis, with right  hemispheric transient ischemic attacks.   OPERATION:  Right carotid endarterectomy with Dacron patch angioplasty.   SURGEON:  Quita Skye. Hart Rochester, M.D.   FIRST ASSISTANT:  Coral Ceo, P.A.   ANESTHESIA:  General endotracheal.   BRIEF HISTORY:  This patient was admitted in diabetic ketoacidosis, and  following her recovery she related the fact that she had had an episode of  weakness involving her left upper and lower extremity lasting 24-36 hours by  history.  It then completely resolved, she states.  Carotid Dopplers were  performed which revealed a high-grade greater than 80-90% right internal  carotid stenosis, mild left internal carotid stenosis.  After cardiac  clearance, she was scheduled for right carotid endarterectomy, with normal  neurologic exam.   PROCEDURE:  The patient was taken to the operating room and placed in the  supine position, at which time satisfactory general endotracheal anesthesia  was administered.  The right neck was prepped with Betadine scrub and  solution and draped in routine sterile manner.  Incision was made along the  anterior border of the sternocleidomastoid muscle and carried down through  subcutaneous tissue and platysma using the Bovie.  The common facial vein  and external jugular veins were ligated with 3-0 silk ties and divided,  exposing the common, internal, and external carotid arteries.  Care  was  taken not to injure the vagus or hypoglossal nerves, both of which were  exposed.  Plaque extended fairly high in the neck, up above the crossing of  the hypoglossal nerve, requiring a high dissection.  A #10 shunt was  prepared, and the patient was heparinized.  The carotid vessels were  occluded with vascular clamps, a longitudinal opening made in the common  carotid with a 15 blade, extending up the internal carotid with the Potts  scissors to a point distal to the disease.  The plaque was at least 90%  stenotic in severity, but the distal vessel appeared normal.  A #10 shunt  was inserted without difficulty, reestablishing flow in about 2 minutes.  A  standard endarterectomy was then performed using the elevator and the Potts  scissors, with an eversion endarterectomy of the external carotid.  The  plaque feathered off the distal internal carotid artery nicely, not  requiring any tacking sutures.  The lumen was thoroughly irrigated with  heparin saline, all loose debris carefully removed, and arteriotomy was  closed with a patch using continuous 6-0 Prolene.  Prior to completion of  the closure, the shunt was removed after about 30 minutes  of shunt time.  Following antegrade and retrograde flushing, the closure was completed, with  reestablishment of flow initially up the external and up the internal  branch.  Carotid  was occluded for less than 2 minutes for removal of the shunt.  Protamine  was then given to reverse the heparin.  Following adequate hemostasis, the  wound was irrigated with saline and closed in layers with Vicryl in a  subcuticular fashion.  Sterile dressing applied.  Patient taken to the  recovery room in satisfactory condition.           ______________________________  Quita Skye Hart Rochester, M.D.     [name]  D:  01/13/2006  T:  01/13/2006  Job:  [id number]

## 2011-04-24 NOTE — Discharge Summary (Signed)
[name], CYPHERS                ACCOUNT NO.:  [id number]   MEDICAL RECORD NO.:  [id number]          PATIENT TYPE:  INP   LOCATION:  1427                         FACILITY:  [facility]   PHYSICIAN:  Wilson Singer, M.D.DATE OF BIRTH:  09-Sep-1956   DATE OF ADMISSION:  03/02/2007  DATE OF DISCHARGE:  03/05/2007                               DISCHARGE SUMMARY   FINAL DISCHARGE DIAGNOSES:  1. Diabetic ketoacidosis.  2. Human immunodeficiency virus disease.  3. Hepatitis C.  4. Previous history of alcohol abuse.   DISCHARGE MEDICATIONS:  1. Lantus insulin 55 units daily.  2. NovoLog insulin, depending on sliding scale.  3. Protonix 40 mg once daily.  4. Prozac 1 tablet daily.  Dose unclear, what she takes at home.   CONDITION ON DISCHARGE:  Stable.   HISTORY:  This 55 year old lady came in with abdominal pain, nausea and  vomiting.  She has had frequent admissions with uncontrolled blood  sugars.  This time she came in with full-blown diabetic ketoacidosis  with a bicarbonate of 13 and a blood glucose of 305.  Please see initial  history and physical examination done by Dr. Darene Lamer.   HOSPITAL PROGRESS:  The patient was admitted to the telemetry floor and  given aggressive intravenous fluids and intravenous insulin.  She did  well with this without any major complications.  On the second day of  admission, she was doing well and eating and drinking well without any  nausea, vomiting, or abdominal pain.  Sugars were still running high, so  Lantus insulin subcutaneously was increased.  On the day of discharge,  she had done well except for some mild right-sided chest pain.  She  continued to eat and drink well.   PHYSICAL EXAMINATION:  VITAL SIGNS:  Temperature 98.5, blood pressure  106/74, pulse 89, saturation on room air 100%.  LUNGS:  Lung fields are clear to auscultation.  There is slight  tenderness on the right chest, corresponding with a right-sided chest  pain.  I suspect this  is musculoskeletal pain.  This should resolve with  simple measures such as nonsteroidal anti-inflammatory medications over  the counter.   Discharge blood work showed a sodium of 134, potassium 3, chloride 103,  bicarbonate 26, glucose 77, BUN 8, creatinine 0.42.  The patient has  been given 40 mEq of K-Dur prior to discharge.   FURTHER DISPOSITION:  The patient needs to take Lantus 55 units a day.  This seemed to optimally control her sugars.  She needs to follow up  with Dr. Emeline Darling, her primary care physician, in one week.      Wilson Singer, M.D.  Electronically Signed     NCG/MEDQ  D:  03/05/2007  T:  03/05/2007  Job:  [id number]

## 2011-04-24 NOTE — H&P (Signed)
NAMEZLATY, [name]                ACCOUNT NO.:  [id number]   MEDICAL RECORD NO.:  [id number]          PATIENT TYPE:  INP   LOCATION:  2927                         FACILITY:  [facility]   PHYSICIAN:  Thomasenia Bottoms, MDDATE OF BIRTH:  January 21, 1956   DATE OF ADMISSION:  11/15/2006  DATE OF DISCHARGE:                              HISTORY & PHYSICAL   All of my history comes from the ED records as the patient is not able  to provide any.   CHIEF COMPLAINT:  Per the records, the patients chief complaint was  abdominal pain.   REASON FOR CONSULTATION:  I was called to evaluate the patient for DKA.  She does have altered level of consciousness and is not able to give me  much information.   PAST MEDICAL HISTORY:  1. HIV (human immunodeficiency virus), which was recently diagnosed      during her last hospitalization in November.  2. Hepatitis C.  3. Pancreatitis.  4. Crack cocaine abuse.  5. Alcohol abuse.  6. Hypertension.  7. Cervical erosions discovered during her last hospitalization.  8. Trichomonas and bacterial vaginitis.  9. Carotid endarterectomy.   MEDICATIONS:  Listed; the patient tells me she takes insulin twice a  day.  She is not able to answer my questions reliably.  I believe she  says it is 70/30 insulin that she takes twice a day and she tells me she  last took her insulin not today but yesterday.  I also see, per her last  discharge summary, that she was discharged on:  1. __________  2. Protonix 40 mg p.o. daily.  3. Prozac.  4. Multivitamin.  5. Thiamine.  6. NovoLog insulin 70/30 40 units b.i.d. is what the discharge summary      says.  7. Bactrim DS 1 tablet daily.  8. Flagyl 500 mg p.o. b.i.d. just 3 days after discharge.   It is not known which medications the patient was actually taking.   SOCIAL HISTORY:  She has a history of cocaine use per the computer.   FAMILY HISTORY:  The patient is unable to provide this.   FOLLOWUP:  At the time of  discharge, she was told to follow up with the  OB/GYN clinic and with ID clinic Dr. Ninetta Lights 1 to 2 weeks after  discharge and with [facility].  It is unclear if she has followed up at  this time.   REVIEW OF SYSTEMS:  The patient is not able to provide.   PHYSICAL EXAMINATION:  VITAL SIGNS:  In the emergency department, her  temp was 95.6 rectally, saturating 100% on room air, blood pressure  140/92, pulse 121, respiratory rate 24.  GENERAL:  She is an emaciated woman.  HEENT:  Normocephalic, atraumatic.  Her sclerae are non icteric.  Her  oral mucosa dry.  NECK:  Emaciated.  CARDIOVASCULAR:  Tachycardic but regular.  You can see the outline of  her rib through skin.  LUNGS:  Clear to auscultation with no wheezes, rhonchi, or rales.  ABDOMEN:  Scaphoid but appears to be nontender when I examine it.  No  masses are appreciated.  She does have bowel sounds.  EXTREMITIES:  No clubbing, cyanosis, or edema.  She does palpable DP  pulses.  NEUROLOGIC:  She is quite lethargic.  She will open her eyes briefly.  She mumbles answers to my questions and her eyes will close again.  She  does attempt to answer the questions.   LABORATORY DATA:  Her venous pH is 7.02, hemoglobin 18, hematocrit 53.0.  Her previous hemoglobin was 14.8 and that was December 10th.  Sodium  127, potassium 4.4, chloride 101, BUN 32, glucose 674.  Those are all  the labs that have been ordered so far.   ASSESSMENT/PLAN:  New onset diabetic ketoacidosis in a 55 year old human  immunodeficiency virus positive woman who previously was not insulin  requiring.   The plan is to admit the patient to the intensive care unit.  She will  be put on an insulin drip.  She will be given large amounts of  intravenous fluids.  We will order more extensive lab work to include  her creatinine.  She will have every 1 hour Accu-checks and her insulin  drip managed.  We will follow her carefully to look for other signs of  infection  that could have triggered this diabetic ketoacidosis.  She is  not able to provide much history so I do not know if she has been having  diarrhea or other problems.  Her lungs sound clear.  Her O2 saturations  at this point appear to be good.  She does not have an acute abdomen.  Her abdominal exam is pretty benign so we will just continue to monitor  but this patient is critically ill at this time.      Thomasenia Bottoms, MD  Electronically Signed     CVC/MEDQ  D:  11/15/2006  T:  11/16/2006  Job:  [id number]

## 2011-04-24 NOTE — H&P (Signed)
NAMENEVAE, PINNIX                ACCOUNT NO.:  [id number]   MEDICAL RECORD NO.:  [id number]          PATIENT TYPE:  EMS   LOCATION:  MAJO                         FACILITY:  [facility]   PHYSICIAN:  Melissa L. Ladona Ridgel, MD  DATE OF BIRTH:  January 28, 1956   DATE OF ADMISSION:  10/16/2006  DATE OF DISCHARGE:                                HISTORY & PHYSICAL   CHIEF COMPLAINT:  Three days of not feeling well.   PRIMARY CARE PHYSICIAN:  [facility].   HISTORY OF PRESENT ILLNESS:  The patient is a 55 year old African American  female who states she has not been feeling well x3 days.  She has been  sluggish and then developed today nausea, vomiting and continued fatigue.  The patient relates she has not been able to take her insulin or other  pills.  The patient states that her son has been ill and she actually  brought him to the emergency room yesterday.  She does not expound on his  illness.  The patient reports intermittent drug and alcohol use.  Her last  use was Wednesday.  The patient was found to be in DKA and she was initiated  on an insulin drip and IV fluids in the emergency room.  [facility] hospitalists  were asked to admit for further care.   REVIEW OF SYSTEMS:  She has been cold.  She denies cough.  Her last  intercourse was September 07, 2006.  She denies any vaginal discharge or  itching and she denies dysuria.  She denies fever or chills.  She had  increasing weight loss.  All other review of systems appear to be negative  at this time.   PAST MEDICAL HISTORY:  1. Diabetes.  2. Bronchitis.  3. Electronic medical record indicates that she had a TIA with carotid      disease resulting in a carotid endarterectomy.  4. The old chart also indicates that she has hepatitis C.  5. Chronic pancreatitis.   PAST SURGICAL HISTORY:  The patient reports no surgery but the electronic  medical record shows right sided endarterectomy.   SOCIAL HISTORY:  She smokes 3-4 cigarettes a day.  She  drinks a 12-ounce  beer when she can get one.  She denies any seizure activity from withdrawal.  She states she uses cocaine.  Her last use was Friday.  She has a son who is  8 years old and lives with her girlfriend.   FAMILY HISTORY:  Mom is deceased with a history of alcohol abuse.  Dad is  deceased with a history of alcohol abuse.  She denied any positive HIV  testing and states that she was tested last year.   ALLERGIES:  NO KNOWN DRUG ALLERGIES.   MEDICATIONS:  She takes Lisinopril, Lopressor, Protonix, and Prozac but she  can tell me the doses and she cannot tell me where she gets them from.   PHYSICAL EXAMINATION:  VITAL SIGNS:  Temperature is 97.5, pulse is 116,  blood pressure is 185/64, respirations 28, saturation 100%.  GENERAL:  This is a very ill-appearing African American female in  mild  distress.  She is normocephalic and cachectic.  Her skin shows intermittent  hyperpigmentation over her face and head.  She has mild tachypnea.  NECK:  Initially appeared to have a lesion that looked like a track mark,  however, in reading the old chart, she did have a right carotid  endarterectomy so I suspect this is the incision site for that.  The patient  states that she did not inject her cocaine.  She has no thyromegaly.  CHEST:  Decreased but clear.  CARDIOVASCULAR:  Tachycardic.  Positive S1 S2.  No S3, S4.  No murmurs,  rubs, or gallops.  ABDOMEN:  Soft, minimally tender in a diffuse pattern with positive bowel  sounds.  EXTREMITIES:  Thin, frail with obvious evidence for weight loss with no  edema.  NEUROLOGIC:  She is awake but forgetful.  Power is 4/5.  DTRs are  unequivocal.  PELVIC:  To evaluate for the trichomonas.  She was reported to have  ulcerations on her cervix.   LABORATORY:  Her sodium is 132, potassium 4.1, chloride 110, CO2 is 5, BUN  is 9, creatinine was not obtained.  Blood glucose on admission was 309.  Point-of-care cardiac enzymes were negative x1.   Amylase is 100.  Lipase is  37.  Her gap is 17.  Urinalysis shows moderate leuk esterase, 3-6 WBCs,  trichomonas, and clue cells.  White count is 12.1, hemoglobin 14.4,  hematocrit 43, platelets 227.  Total bilirubin is 1.6, AST is 42, ALT is 69.   Her EKG shows tachycardia at 119.  She appears to have LVH.  No ST-T wave  changes are noted.   ASSESSMENT:  This is a 55 year old African American female admitted with  weakness and nausea and vomiting and decreased mental status who is found to  be in diabetic ketoacidosis.   PLAN:  1. Cardiovascular.  Sinus tachycardia secondary to DKA.  She will be      admitted to stepdown because she is quite ill.  We will treat her with      Glucomander, IV fluid replacement, and insulin.  The patient will have      serial cardiac markers because of her use of cocaine.  Hypertensive      urgency, we will use p.r.n. labetalol for that.  2. Pulmonary. We will check a chest x-ray to rule out occult infection.      Tachypnea secondary to severe acidosis.  We will give her bicarbonate      to correct her acidosis and correct her DKA.  3. GI.  Abdominal pain, nausea, vomiting secondary to DKA.  We will      reverse the process with bicarbonate and insulin and IV fluids.  We      will treat her with Zofran.  She will be NPO for now.  We will use IV      Protonix.  The patient has a history of chronic pancreatitis but this      does not appear to be exacerbated at this time.  She also has a history      of hepatitis C.  4. GU.  Cervical erosions and bacterial vaginosis with trichomonas      present.  We will check HIV.  The patient will need an outpatient      followup visit once stabilized to evaluate her cervix.  For now, I will      treat her for PID with ceftriaxone 250 mg IV x1 and  treat her BV and      trichomonas with IV Flagyl and IV doxycycline which can be converted     over to oral when she is able to take pills for a complete course of 14       days.  We will check a pregnancy test and her creatinine .  5. Endocrine.  We will treat her DKA as stated with Glucomander, IV      fluids, and we will check a TSH to rule out reasons for her weight      loss.  I suspect that her polysubstance abuse has something to do with      her chronic weight loss.  6. Substance abuse.  We will order a care management consult.  7. DVT prophylaxis will be with Lovenox.  It will be dosed once we have a      creatinine.   Addendum: Please note that her UA suggested UTI I, therefore, elected to use  Ceftriaxone 1 gram Iv q day to cover both her PID and UTI.  MLT 10/20/06      Melissa L. Ladona Ridgel, MD  Electronically Signed     MLT/MEDQ  D:  10/16/2006  T:  10/17/2006  Job:  [id number]

## 2011-04-24 NOTE — Discharge Summary (Signed)
[name], [name]                ACCOUNT NO.:  [id number]   MEDICAL RECORD NO.:  [id number]          PATIENT TYPE:  INP   LOCATION:  4733                         FACILITY:  [facility]   PHYSICIAN:  Madaline Savage, MD        DATE OF BIRTH:  1956/07/30   DATE OF ADMISSION:  01/03/2007  DATE OF DISCHARGE:  01/07/2007                               DISCHARGE SUMMARY   PRIMARY CARE PHYSICIAN:  Dr. Emeline Darling at [facility].   CONSULTATIONS:  Infectious disease, Dr. Roxan Hockey, saw the patient.   FINAL DIAGNOSES:  1. Severe uncontrolled diabetes mellitus.  2. Status post syncope secondary to dehydration, hypovolemia.  3. Human immunodeficiency virus positive.  4. Questionable lymphadenopathy in CT chest.  5. Atypical chest pain.  6. History of crack-cocaine abuse.  7. History of alcohol abuse.   HISTORY OF PRESENT ILLNESS:  Ms. Herbst is a 55 year old lady with a  history of HIV positive diagnosed in November 2007, who was admitted  after she passed out.  She complained of some left sided chest pain, and  she states she was passed in the ER.  She was found to have an elevated  blood sugar of 943.  She was not acidotic at that point in time.  Her  CO2 was 20.  She was admitted with severe uncontrolled diabetes  mellitus.   PROCEDURES PERFORMED:  1. She had a CT chest angiogram done on the 29th of January 2008,      which showed no pulmonary embolism.  It also showed prominent      axillary adenopathy, subcarinal and right hilar adenopathy.  This      is nonspecific, and raises a question about lymphoid neoplasm, or      even HIV.  2. She had a portable x-ray chest done on 29th of January 2008, which      showed no evidence of acute cardiopulmonary disease, chronic      calcific pancreatitis.   PROBLEM LIST:  1. Severe uncontrolled diabetes mellitus.  Her blood sugars were very      much out of control.  She was started on insulin drip, which is      weaned off, and she is now on subcutaneous  insulin, and her blood      sugars have been well controlled.  She is probably noncompliant on      her medications, and we have given her diabetic education while she      was in the hospital.  2. HIV positive.  We consulted ID, Dr. Roxan Hockey.  She states she has      been recommended to take HIV retroviral therapy, but she is not      being consistent in taking them.  If she is not taking them      consistently, Dr. Roxan Hockey recommended that she not be put on any      retroviral therapy at this point in time because that would only      enhance resistance.  Her CD4 count at this point in time is 450.  She is probably very noncompliant.  Dr. Roxan Hockey offered her      followup at the clinic.  The number of the clinic is [phone], or      she could followup with the [facility] doctor.  3. Lymphadenopathy on the CT scan.  This is not specific      lymphadenopathy, which is most likely secondary to HIV, but we      could not rule out lymphoma.  I discussed this with Dr. Roxan Hockey,      who recommended that she follow up as an outpatient for it.  4. Alcohol abuse.  We will put her on thiamin and folate at the time      of discharge.   DISCHARGE MEDICATIONS:  1. Lantus 40 units subcutaneously twice daily.  2. Regular insulin 20 units subcutaneously with dinner.  3. Thiamin 100 mg daily.  4. Folic acid 1 mg daily.   DISPOSITION:  She will now be discharged home in stable condition.  She  has been given diabetic education, and she was told in the importance of  being compliant with the diabetic medications.  She is asked to followup  with her family doctor at [facility].  She is also given the option of  following with the clinic for her HIV.  She was given the number of the  clinic, which is [phone], and she could call to make an appointment if  she wants to followup.      Madaline Savage, MD  Electronically Signed     PKN/MEDQ  D:  01/07/2007  T:  01/08/2007  Job:  [id number]

## 2011-04-24 NOTE — Consult Note (Signed)
[name], [name]                ACCOUNT NO.:  [id number]   MEDICAL RECORD NO.:  [id number]          PATIENT TYPE:  INP   LOCATION:  5504                         FACILITY:  [facility]   PHYSICIAN:  Jonelle Sidle, M.D. LHCDATE OF BIRTH:  [birth year]   DATE OF CONSULTATION:  01/09/2006  DATE OF DISCHARGE:                                   CONSULTATION   REQUESTING PHYSICIAN:  Pearlean Brownie, M.D.   REASON FOR CONSULTATION:  Preoperative evaluation.   HISTORY OF PRESENT ILLNESS:  [name] is a 55 year old woman with a  history of type 2 diabetes mellitus complicated previously by diabetic  ketoacidosis, hypertension, chronic calcific pancreatitis, hepatitis B and  C, chronic anemia, and polysubstance abuse. She was recently admitted to the  hospital with evidence of diabetic ketoacidosis also associated with  abdominal discomfort, nausea and emesis. She has been stabilized during her  hospital observation with improvement in glucose control and electrolyte  status. She also had symptoms of possible __________ and referred for a  carotid ultrasound which was performed on February 1 and revealed greater  than 80% right internal carotid artery stenosis with 60%-80% left internal  carotid artery stenosis. The patient was seen by CV[facility] and their  considerations for potential carotid endarterectomy on the right. The  patient has also experienced some intermittent epigastric and chest  discomfort during her hospital stay over the last 48 hours. She describes  this as both a tightness and a burning sensation that seems to be worse when  she coughs. Her chest x-ray from February 2 shows some mild interstitial  edema with probable atelectasis at the bases and some interval enlargement  in the cardiac silhouette although the heart size is presently at the upper  limits of normal. She has had cardiac markers obtained with her recent  symptoms with findings of normal troponin-I levels.  Additionally, the  patient has undergone echocardiography during this hospital stay as read by  Dr. Donnie Aho indicating an ejection fraction estimated to be 50% with possible  hypokinesis of the septum. No major valvular abnormalities were described.  The aortic root was described as normal in size. We have been asked to  evaluate the patient preoperatively.   ALLERGIES:  No known drug allergies.   Present medications include:  1.  Aspirin 81 mg p.o. q.d.  2.  Insulin 70/30, 23 units subcu q. a.m. and 18 units subcu q. p.m.  3.  NovoLog sliding scale.  4.  Potassium chloride 40 mEq p.o. q.d.  5.  Ultram 50 mg p.o. q.d.  6.  Neurontin 300 mg p.o. b.i.d.  7.  Protonix 40 mg p.o. q.d.   Past medical history is as outlined above in the history of present illness.  There is no clearly documented history of coronary artery disease or  myocardial infarction. Also no clear history of chronic lung disease.   FAMILY HISTORY:  Noncontributory for premature cardiovascular disease based  on available information.   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 She is  presently unemployed. She has a history  of cocaine and alcohol use recently. She also smoked cigarettes  approximately one-half pack per day over the last 30-40 years.   REVIEW OF SYSTEMS:  As described in the history of present illness. She has  had a dry cough but no hemoptysis, wheezing, fevers or chills. She does not  report any typical exertional chest pain. She has had no lower extremity  edema, orthopnea or PND. She also denies palpitations.   EXAMINATION:  The patient is afebrile at 98.1 degrees, heart rate is 100 in  sinus rhythm, respirations 20, blood pressure is 143/91, most recent CBG  378, oxygen saturation is 100% on room air. Weight is 50 kg. In general,  this is a thin, disheveled, somewhat cachectic appearing woman in no acute  distress.  HEENT:  Oropharynx reveals poor dentition.  NECK: Supple without obvious elevated jugular venous pressure. There is a  carotid bruit on the right. No thyroid tenderness is noted.  LUNGS: Exhibit coarse breath sounds but no rales or wheezing.  RESPIRATORY: Effort is not labored at rest.  CARDIAC: Reveals a regular rate and rhythm without loud murmur. There is no  S3 gallop heard. No pericardial rub is noted.  ABDOMEN: Soft, mildly tender but no guarding. Bowel sounds are present.  EXTREMITIES: No pitting edema. There is some muscle wasting.  SKIN: Reveals scattered tattoos. No ulcerative changes are noted.  MUSCULOSKELETAL: No kyphosis is noted.  NEUROPSYCHIATRIC: The patient is alert and oriented x 3.   LABORATORY DATA:  WBC is 6.2, hemoglobin 11.8, hematocrit 35.1, platelets  150. Sodium 132, potassium 4.2, chloride 105, bicarb 21, glucose 270, BUN 7,  creatinine 0.7. Peak CK 34, peak CK-MB 1, peak troponin-I 0.02. TSH 1.14.  LDL cholesterol is 61. Urine pregnancy test negative. Urine drug screen  positive for cocaine metabolites. Blood alcohol level less than 5.   12-lead electrocardiogram from February 2 shows sinus rhythm at 99 beats per  minute with borderline increased voltage and nonspecific ST changes.  Reviewing prior tracings from January 31 shows previous findings of sinus  tachycardia in the 120s with more predominantly increased voltage and  inferolateral ST segment depression of less than 1 mm. These changes have  improved on subsequent tracings.   IMPRESSION:  1.  Preoperative evaluation in a 55 year old woman with type 2 diabetes      mellitus and a history of diabetic ketoacidosis, hypertension, and      polysubstance abuse including tobacco, alcohol and cocaine (all recent).      She has had some recent chest discomfort which is somewhat atypical in      description. Her electrocardiogram from presentation showed sinus     tachycardia with inferolateral ST segment depression  although this has      improved with subsequent tracings. Recent cardiac markers are normal.      Echocardiogram during this stay reveals an ejection fraction of 50% with      possible septal hypokinesis.  2.  Carotid artery disease, right greater than the left, being considered      for carotid endarterectomy in the setting of recent transient ischemic      attack.   RECOMMENDATIONS:  1.  Agree with aspirin. Will also place the patient on low-dose Lopressor.  2.  Will arrange an adenosine Myoview on medical therapy to further risk      stratify prior to considering vascular surgery.  3.  Will follow with you.  ______________________________  Jonelle Sidle, M.D. [facility]     SGM/MEDQ  D:  01/09/2006  T:  01/09/2006  Job:  [id number]   cc:   Pearlean Brownie, M.D.  Fax: [phone]   [facility]

## 2011-04-24 NOTE — H&P (Signed)
NAMEILANA, PREZIOSO                ACCOUNT NO.:  [id number]   MEDICAL RECORD NO.:  [id number]          PATIENT TYPE:  EMS   LOCATION:  ED                           FACILITY:  [facility]   PHYSICIAN:  Madaline Savage, MD        DATE OF BIRTH:  [birth year]   DATE OF ADMISSION:  03/02/2007  DATE OF DISCHARGE:                              HISTORY & PHYSICAL   PRIMARY CARE PHYSICIAN:  Dr. Emeline Darling with [facility].  The patient is  unassigned to [place].   CHIEF COMPLAINT:  Abdominal pain, nausea and vomiting.   HISTORY OF PRESENT ILLNESS:  Ms. Brotherton is a 55 year old African-  American lady with multiple medical problems and uncontrolled diabetes  mellitus, who comes in with abdominal pain, nausea and vomiting.  She  has frequent admissions with uncontrolled blood sugars.  She has had two  admissions in the last one month.  She states that she could not see her  primary care doctor, so she ran out of her insulin and has not been  taking her insulin for the last one week.  She states that she has been  having nausea, vomiting, and abdominal pain for the last 3-4 days.  Her  abdominal pain is epigastric.  There is no blood in her vomitus.  She  has thrown up multiple times.  Her abdominal pain has improved since she  has been in the hospital.  She denies any other complaints at this point  in time.  On arrival to the ER, she was found to be acidotic, and she  was given 15 units of NovoLog, and since then, her blood sugar has come  down.   PAST MEDICAL HISTORY:  1. Diabetes mellitus with multiple admissions for DKA in the past.      She has poorly controlled diabetes mellitus.  2. HIV positive diagnosis in November, 2007.  Her CD4 count in      February, 2008 was 450.  She is very noncompliant with medications,      and she was recommended by infectious disease, Dr. Roxan Hockey, not to      be put on antiretroviral therapy at this point in time, as it would      increase resistance.  3. History of  hepatitis C.  4. History of crack cocaine abuse.  5. History of alcohol abuse.   ALLERGIES:  No known drug allergies.   CURRENT MEDICATIONS:  She is on Lantus 30 units in the morning and 22  units at night.  She is also on some pill, she does not remember the  names of those pills.   SOCIAL HISTORY:  She is divorced and unemployed.  She smokes about 1-2  cigarettes per day, but she drinks 1-2 beers a day.  She uses crack  cocaine occasionally.  She last used crack cocaine yesterday.   FAMILY HISTORY:  The patient's mother is deceased with a history of  alcohol abuse.  Her father also passed away with a history of alcohol  abuse.   REVIEW OF SYSTEMS:  GENERAL:  She denies  any recent weight loss, weight  gain.  No fevers or chills.  HEENT:  No headaches, no blurred vision, or  sore throat.  CARDIOVASCULAR:  Denies chest pain, palpitations.  RESPIRATORY:  No shortness of breath or cough.  GI:  Does have abdominal  pain and nausea.  No diarrhea or constipation.  EXTREMITIES:  No  tingling or numbness of the toes or fingers.   PHYSICAL EXAMINATION:  GENERAL:  She is alert and oriented x3.  VITALS:  Temperature is 98.5, pulse rate 112 per minute, blood pressure  128/72.  Oxygen saturation 99% on room air.  HEENT:  Normocephalic and atraumatic.  Pupils are equal, round and  reactive to light.  Mucous membranes are moist.  NECK:  Supple.  No JVD.  No carotid bruits.  CARDIOVASCULAR:  S1 and S2 are heard.  Regular rate and rhythm.  No  murmurs, rubs or gallops.  CHEST:  Clear to auscultation.  ABDOMEN:  Mildly tender in the epigastrium.  Bowel sounds heard.  EXTREMITIES:  No clubbing, cyanosis or edema.   LABS:  She had a BNP at 3:43 p.m. today, which showed a sodium of 133,  potassium 3.8.  Bicarb 13.  Creatinine 1.17.  Blood glucose of 305.  Her  repeat BNP at 6:35 p.m., which is three hours later shows a sodium of  136, potassium 2.8.  Her chloride is 107.  The bicarb is 17.   Creatinine  is 1.  Glucose 56.  Her urinalysis is negative.  White count is 7.3,  hemoglobin 14.1, hematocrit 41, platelets 197.   IMPRESSION:  1. Mild diabetic ketoacidosis.  2. Uncontrolled Diabetes with noncompliance with medications.  3. Hypokalemia.  4. Abdominal pain secondary to her diabetic ketoacidosis.  5. Polysubstance abuse.  6. Human immunodeficiency virus positive.  7. Hepatitis C positive.   PLAN:  This is a 55 year old lady with uncontrolled diabetes mellitus  and poor compliance, who comes in with diabetic ketoacidosis.  On  arrival, her bicarb was 25 with anion gap of 18.  She was given 15 units  of NovoLog, and since then, her bicarb has come up to 17.  Her blood  sugar at that time was 66.  I will continue her on IV fluids.  I will  restart her home dose of Lantus and put her on sliding scale.  I will  not start her on an insulin drip at this point in time because of her  low blood sugar.  I would also bolus her IV fluids.  She is tachycardic  at this point in time.  Will replace her potassium.  At this point in  time, I would also put her on DVT and GI prophylaxis.      Madaline Savage, MD  Electronically Signed     PKN/MEDQ  D:  03/02/2007  T:  03/02/2007  Job:  [id number]

## 2011-04-24 NOTE — Discharge Summary (Signed)
[name], [name]                ACCOUNT NO.:  [id number]   MEDICAL RECORD NO.:  [id number]          PATIENT TYPE:  IPS   LOCATION:  0503                          [facility]ACILITY[facility]  [facility]   PHYSICIAN:  Anselm Jungling, MD  DATE OF BIRTH:  27-Jun-1956   DATE OF ADMISSION:  06/22/2007  DATE OF DISCHARGE:  07/06/2007                               DISCHARGE SUMMARY   IDENTIFYING DATA/REASON FOR ADMISSION:  This was an inpatient  psychiatric admission for [name], a 55 year old African-American female  admitted with polysubstance abuse, involving heavy cocaine and alcohol  usage, and depression.  She came to [place] as an insulin-dependent diabetic  with a history of pancreatitis.  Please refer to the admission note for  further details pertaining to the symptoms, circumstances and history  that led to her hospitalization.   INITIAL DIAGNOSTIC IMPRESSION:  She was given initial AXIS I diagnosis  of polysubstance dependence and substance-induced mood disorder.   MEDICAL/LABORATORY:  The patient came to [place] with a history of insulin-  dependent diabetes, and a history of chronic pancreatitis.  She was  followed closely by the nurse practitioner throughout her inpatient  stay.  Her blood glucoses were followed carefully, and stabilization of  them was challenging throughout her inpatient stay.   HOSPITAL COURSE:  The patient was admitted to the adult inpatient  psychiatric service.  She presented as a thin, disheveled, and  chronically ill-appearing female who was fully oriented, but appeared  tired, depressed, and listless.  She gave little information.  There  were no overt signs or symptoms of psychosis or thought disorder, but  she did verbalize a desire for help.  She denied suicidal ideation.  She  was placed on a Librium withdrawal protocol.  Her energy level was very  low throughout much of her medical stay, with very low energy,  complaining of feeling cold, and as a result staying in bed under  the  covers with the heater on full blast during much of her stay.  She did  not exhibit significant signs of withdrawal symptoms while on the  Librium protocol.   Her Librium protocol needed to be adjusted downward due to appearance of  excess sedation, which may have been related to impaired metabolism of  Librium.  With the reduction in Librium, she became more alert, was able  to be more active in the unit and ambulated more steadily.   She worked closely with the casemanager towards a plan involving going  to some form of assisted-living facility.  She was in agreement with  this plan.   She continued with very low energy, and it was determined that the  patient had a positive serology for syphilis.  In addition, she was  known to be HIV positive and positive for hepatitis C.  An infectious  disease consult was obtained, and the consultant followed the patient  during the remainder of her inpatient stay.  It was the consultants  impression that there was not any significant evidence of any central  nervous system syphilitic involvement.  Appropriate treatment with  penicillin G  was given.   At the end of the second week, her energy level was improving, she was  up, active, dressed, and participating in the milieu.  She was much  better organized.  She looked better and reported that her mood was  good.  She began to look forward to discharge.  We began a trial of  Celexa to address depressive symptoms, and this appeared to be well-  tolerated.   On the 15th hospital day, the patient was appropriate for discharge.  She agreed to the following aftercare plan.   AFTERCARE:  The patient was to follow up with, and reside at the  [facility].  [facility], was to begin working with the patient.  [name], casemanager, was to visit the patient at the [facility] on July 06, 2007.  She was to continue with psychiatric  outpatient aftercare through various county  resources.   DISCHARGE MEDICATIONS:  1. Celexa 10 mg daily.  2. Neurontin 300 mg t.i.d.  3. Colace 100 mg daily.  4. Lantus insulin 40 units in a.m. and at bedtime.  5. NovoLog 5 units subcu following each meal.  6. MiraLax 17 grams daily as needed for constipation.  7. Monistat 2% vaginal cream twice daily through July 15, 2007 for      vaginal infection.   The patient was instructed to follow up with Dr. Reche [name] at [facility],  regarding her various medical issues.   DISCHARGE DIAGNOSES:  AXIS I:  Polysubstance dependence, severe,  chronic.  AXIS II:  Deferred.  AXIS III:  History of insulin-dependent diabetes mellitus, vaginal  infection, syphilis, HIV, and hepatitis C.  AXIS IV:  Stressors:  Severe.  AXIS V:  GAF on discharge 50.      Anselm Jungling, MD  Electronically Signed     SPB/MEDQ  D:  07/25/2007  T:  07/25/2007  Job:  [id number]

## 2011-04-24 NOTE — H&P (Signed)
[name], [name]                ACCOUNT NO.:  [id number]   MEDICAL RECORD NO.:  [id number]          PATIENT TYPE:  EMS   LOCATION:  MAJO                         FACILITY:  [facility]   PHYSICIAN:  Mobolaji B. Bakare, M.D.DATE OF BIRTH:  [birth year]   DATE OF ADMISSION:  08/27/2005  DATE OF DISCHARGE:                                HISTORY & PHYSICAL   PRIMARY CARE PHYSICIAN:  She attends [facility].   CHIEF COMPLAINT:  Hyperglycemia.   HISTORY OF PRESENTING COMPLAINT:  [name] is a 55 year old African-  American female who was recently discharged from [facility]  actually yesterday, 08/08/05, after she was admitted for __________ disease.  The patient was discharged home on insulin 70/30 b.i.d.  Unfortunately she  has not received insulin since 12 noon yesterday because the company that is  supposed to deliver her medication has not done so since.  She went to  [facility] today for routine followup and she was found to have a high blood  glucose and she was referred to [facility].  There, [name] had  blood glucose of 633 with a bicarbonate of 33 and urinalysis is not  available at this time.  [name] denies any nausea, vomiting, diarrhea,  fever, chills.  She received 15 units of NovoLog and blood glucose is  currently 256.   She is not aware of when her medications will be delivered.   PAST MEDICAL HISTORY:  1.  Diabetes mellitus.  2.  History of pancreatitis.  3.  Anemia.  4.  Polysubstance abuse.  5.  Diabetic ketoacidosis.  6.  Hepatitis C infection.  7.  Alcohol abuse.  8.  History of noncompliance.   CURRENT MEDICATIONS:  1.  Insulin 70/30, 18 units in the evening and 23 units prior to breakfast.  2.  Lisinopril 10 mg p.o. every day.  3.  Multivitamins 1 p.o. every day.   ALLERGIES:  No known drug allergies.   FAMILY HISTORY:  Father died at the age of 54 and mother at the age of 68.  She does not know the causes of death.  She has a  positive family history of  asthma.   SOCIAL HISTORY:  The patient drinks alcohol and she smokes cigarettes prior  to recent hospitalization.  Currently the patient is on patch.  She uses  crack cocaine.  She is currently unemployed and gets food stamps.   PHYSICAL EXAMINATION:  VITAL SIGNS:  Blood pressure 144/90, pulse of 102,  respiratory rate of 24, O2 saturation of 98%.  GENERAL:  The patient is comfortable, not in respiratory distress, not  dehydrated, anicteric, not clinically pale.  No elevated JVD.  HEENT:  Mucous membranes moist.  No thrush.  No scleral icterus.  CARDIOVASCULAR:  S1, S2 regular.  No murmur, no gallop.  ABDOMEN:  Not distended.  There is mild epigastric tenderness.  No rebound.  No guarding.  Bowel sounds present.  No hepatosplenomegaly palpable.  EXTREMITIES:  No pedal edema.  No calf tenderness.  SKIN:  No rash.  No petechiae.  CNS:  Nonfocal neurological deficit.  INITIAL LABORATORY DATA:  White cell 3.4, hemoglobin 10.2, hematocrit 30.8,  platelets 280, MCV 94.  Creatinine 0.5, sodium 133, potassium 4.3, chloride  102, BUN 11, glucose 633, bicarbonate 23.   EKG showed normal sinus rhythm with a heart rate of 100, no acute ischemic  changes.   ASSESSMENT/PLAN:  Uncontrolled diabetes mellitus secondary to lack of  availability of insulin.  The patient has received NovoLog in the emergency  department and blood glucose has come down fairly well.  We will ask social  worker to arrange for insulin 70/30 one vial for patient to use at home  until she can obtain medication from the drug store.  She is currently not  in DKA and she has no other symptoms to warrant hospitalization at this  point.  We need to insure that she gets her medications as appropriate.  The  patient will follow up with Dr. Audria Nine in the office.      Mobolaji B. Corky Downs, M.D.  Electronically Signed     MBB/MEDQ  D:  08/27/2005  T:  08/27/2005  Job:  [id number]   cc:   Maurice March, M.D.  Fax: [phone]

## 2011-04-24 NOTE — H&P (Signed)
[name], [name]                ACCOUNT NO.:  [id number]   MEDICAL RECORD NO.:  [id number]          PATIENT TYPE:  INP   LOCATION:  3310                         FACILITY:  [facility]   PHYSICIAN:  Isidor Holts, M.D.  DATE OF BIRTH:  May 23, 1956   DATE OF ADMISSION:  01/03/2007  DATE OF DISCHARGE:                              HISTORY & PHYSICAL   PMD:  Dr. Emeline Darling, [facility].   CHIEF COMPLAINT:  Passed out.   HISTORY OF PRESENT ILLNESS:  This is a 55 year old female.  For past  medical history, see below.  According to the patient, she has had  several days of polydipsia and polyuria and for the past 1 week, has had  a cough productive of beige phlegm.  She experienced some left-sided  chest pain today, but otherwise feels that she has been in good health  since her last hospitalization approximately 3-4 weeks ago.  On January 03, 2007, at 8 a.m. she went to an eating house on [address] and  when she finished eating, she went to [employer] to a place she  knew, to drink coffee, drink some beer, eat some hot dogs.  Following  all this, she left the place and got ready to cross the street, and that  is the last she remembers.  She tells me that she must have passed out  and when she awoke, she was in the hospital.  She denies fever, denies  abdominal pain.  Denies shortness of breath.  She has experienced  increasing polydipsia and polyuria.  Denies diarrhea.  She states that  she takes her insulin on a regular basis and that she also takes had  diabetic pills.   PAST MEDICAL HISTORY:  1. Diabetes mellitus, status post admission November 10 to October 23, 2006, for DKA.  2. HIV positive, diagnosed in November 2007.  CD-4 count at that time      was 140.  3. Hepatitis C positive.  4. Hypertension.  5. Alcohol abuse.  6. Previous history of pancreatitis.  7. Smoking history.  8. Status post carotid endarterectomy.  9. Crack cocaine abuse.   MEDICATIONS:   The patient is not entirely sure of the type of insulin  she utilizes or the exact dose, but as far as can be determined, she is  on:  1. Lantus 40 units subcutaneously b.i.d.  2. Regular insulin 20 units subcutaneously at dinnertime.  3. Diabetic pills (name unknown).   ALLERGIES:  No known drug allergies.   SOCIAL HISTORY:  The patient is divorced for approximately 12 years now,  has a 65 year old son, is unemployed.  Smokes, according to her, about 1-  2 cigarettes per day, though she says it is down from about 2 packets  per day.  She also drinks beer, she states only about 1-2 beers day, and  utilizes crack cocaine occasionally.   FAMILY HISTORY:  The patient's mother is deceased with a history of  alcohol abuse.  Her father is deceased with a history of alcohol abuse.  Family history is otherwise  noncontributory to this admission.   PHYSICAL EXAMINATION:  VITAL SIGNS:  Temperature 98.1, pulse 92 per  minute and regular, respiratory rate 16,.  BP was initially 105/73 mmHg  but after a bolus of 1 L of normal saline in the emergency department,  rechecked 137/88 mmHg.  Pulse oximeter 99% on room air.  GENERAL:  The patient is alert, oriented, communicative, not short of  breath at rest, in no obvious acute distress.  She appears cachectic and  dry.  HEENT:.  No clinical pallor, no jaundice.  No conjunctival injection.  NECK:  Supple.  JVP not seen.  No palpable lymphadenopathy.  No palpable  goiter.  CHEST:  Clinically clear to auscultation.  No wheezes, no crackles,  although there is slightly diminished air entry at the left base.  CARDIAC:  Heart sounds 1 and 2 heard, normal, regular, no murmurs.  ABDOMEN:  Flat, soft and nontender.  No palpable organomegaly, no  palpable masses.  Normal bowel sounds.  EXTREMITIES:  Lower extremity exam showed no pitting edema.  Palpable  peripheral pulses.  MUSCULOSKELETAL:  Quite unremarkable.  CENTRAL NERVOUS SYSTEM: No focal neurologic  deficit on gross  examination.   INVESTIGATIONS:  CBC:  WBC 4.4, hemoglobin 12.6, hematocrit 39.2,  platelets 189.  Lipase 61, amylase 133.  Urinalysis is negative.  Electrolytes:  Sodium 133, potassium 3.8, chloride 96, CO2 20, BUN 16,  creatinine 0.89, glucose 943.  Alcohol level 203.   ASSESSMENT AND PLAN:  1. Severe uncontrolled diabetes mellitus.  The patient does not appear      to be in diabetic ketoacidosis, neither does she appear to be      hyperosmolar.  We have checked her calculated osmolarity, and this      is 209.18.  We shall therefore manage the patient with aggressive      intravenous fluid hydration with normal saline, commence her on      intravenous infusion of insulin via Glucommander protocol and      subsequently transition to scheduled Lantus insulin, when glycemia      is improved.   1. Dehydration/hypovolemia.  This is secondary to #1 above.  We shall      manage with intravenous infusion of normal saline.   1. Syncope.  This is likely secondary to #1 and 2; however, for      completeness, will cycle cardiac enzymes and do EKG.   1. Left-sided chest pain/cough.  It is important to rule out      pneumonia.  We shall therefore do a chest x-ray, but empirically      start the patient on Avelox, as she has a productive cough.  We      shall do a D-dimer to rule out pulmonary embolus. If this is      elevated and the CXR is negative, she may need a chest CT      angiogram.   1. Alcohol abuse.  We shall watch for alcohol withdrawal and utilize      p.r.n. Ativan.  Meanwhile, commence patient on vitamin supplements.   1. History of crack cocaine abuse.  We shall counsel appropriately.   1. History of hypertension.  The patient is normotensive at present.      We shall therefore observe only, for now.   1. Human immunodeficiency virus positive.  The patient will need to     follow up with ID following discharge, but for completeness we      shall check  a  CD-4 count and viral load.   Further management will depend on clinical course.      Isidor Holts, M.D.  Electronically Signed     CO/MEDQ  D:  01/03/2007  T:  01/04/2007  Job:  [id number]   cc:   Lacretia Leigh. Ninetta Lights, M.D.

## 2011-04-24 NOTE — Discharge Summary (Signed)
[name], [name]                ACCOUNT NO.:  [id number]   MEDICAL RECORD NO.:  [id number]          PATIENT TYPE:  INP   LOCATION:  1611                         FACILITY:  [facility]   PHYSICIAN:  Mallory Shirk, MD     DATE OF BIRTH:  1956/09/28   DATE OF ADMISSION:  08/18/2005  DATE OF DISCHARGE:  08/26/2005                                 DISCHARGE SUMMARY   DISCHARGE DIAGNOSES:  1.  Diabetic ketoacidosis.  2.  Polysubstance abuse.  3.  Hypertension.   DISCHARGE MEDICATIONS:  1.  Insulin 70/30 units at night before dinner, 22 units in the morning.      The patient has been given explicit instructions as to how to take this      medication.  2.  Lisinopril 10 mg p.o. daily.  3.  Multivitamin 1 tablet p.o. daily.   FOLLOW-UP APPOINTMENTS:  Dr. Dow Adolph at [phone] at 11:30 a.m.  tomorrow, at which time the patient will review all medications with Dr.  Audria Nine.  Further management of the patient's diabetes is referred to Dr.  Audria Nine.   HISTORY OF PRESENT ILLNESS:  [name] is a 55 year old African-American  woman with a history of diabetes mellitus which is treated with a pill,  which she did not remember the name of.  Patient ran out of her medication  about a week prior to admission, on August 18, 2005.  The patient started  having nausea, vomiting, weakness three days prior to admission.  She  presented to the emergency department.  Her blood sugar was 254.  Carbon  dioxide 13.3.  Urine ketones greater than 80.  She was admitted for further  management.   PAST MEDICAL HISTORY:  1.  Diabetes mellitus diagnosed in 2006.  2.  History of pancreatitis 15 years ago.  3.  Anemia.   MEDICATIONS:  Unknown.  She uses one pill of unknown name or dose.   ALLERGIES:  No known drug allergies.   PHYSICAL EXAMINATION ON ADMISSION:  VITAL SIGNS:  Blood pressure 166/103,  pulse 109, respiratory rate 20, O2 sats 99%.  GENERAL:  The patient appeared emaciated.  No  acute distress.  HEENT:  Normocephalic and atraumatic.  PERRL.  Mucous membranes moist.  Oropharynx nonerythematous.  NECK:  Supple.  No LAD.  No JVD.  LUNGS:  Clear to auscultation bilaterally.  CARDIOVASCULAR:  S1 and S2.  Regular rate and rhythm.  No murmurs, rubs or  gallops.  ABDOMEN:  Soft.  Positive bowel sounds.  Mild epigastric tenderness.  EXTREMITIES:  No clubbing, cyanosis or edema.  NEUROLOGIC:  Nonfocal.   LABS:  WBC 5.7, hemoglobin 13.2, hematocrit 39, MCV 93, platelets 222,  neutrophils 65, lymphs 34.  Sodium 130, potassium 3.6, chloride 99, carbon  dioxide 13, glucose 254, BUN 6, creatinine 1.  Alkaline phosphatase 50,  total protein 6.8, albumin 3.2, calcium 8, anion gap 18, lipase 58.  Urinalysis showed greater than 1000 glucose, ketones greater than 80,  protein 100, negative for nitrites and leukocytes.   HOSPITAL COURSE:  Problem 1:  Patient was admitted initially to  the step-  down unit.  She was started on a Glucomander.  The patient was subsequently  transferred to the floor and was initially started on Lantus.  Her diabetes  was difficult to control.  Finally, a regimen of 70/30 Novolin, 18 units in  the evening prior to dinner and 22 units prior to breakfast, seems to work  well.  Patient is being discharged on this regimen.  She was given diabetic  education, taught how to self-administer insulin.  Patient was observed to  administer insulin at alternative sites with good techniques.  She will be  followed by Dr. Dow Adolph, with whom she has an appointment with on  August 27, 2005 at 11:30 a.m., at which time the patient will review all  medications with Dr. Audria Nine.  Further management of diabetes is referred  to Dr. Audria Nine.  Patient was given diabetic education information.  A  video was shown to her, and patient has seen all of this and comprehended  it.  Has verbalized understanding of all instructions.   Problem 2:  Polysubstance  abuse:  Patient was counseled to quit drinking  alcohol, stop smoking cigarettes, and using cocaine.  The patient has been  referred to outpatient detox.   Problem 3:  Hypertension:  Patient does not remember being on an  antihypertensive.  She was started on lisinopril at 10 mg p.o. daily.  This  has been continued.   Problem 4:  Elevated LFTs:  Hepatitis panel showed patient's profile to be  as follows:  Hep B core antibodies positive, hep B surface antibodies  positive, hepatitis C antibody was also positive, hep C quantitative  7,110,000.  Quantitative RNA 6.85.  Further management of her hepatitis is  deferred to primary care physician.  Patient was advised very strongly to  avoid alcohol.   DISPOSITION:  Patient was discharged in stable condition.  On the day of  discharge, her blood pressure was 117/80, pulse 93, respirations 18,  temperature 98.1 with sats 100% on room air.  She was ambulatory, tolerating  p.o. without difficulty.  Her friends are going to come take her home.      Mallory Shirk, MD  Electronically Signed     GDK/MEDQ  D:  08/26/2005  T:  08/26/2005  Job:  [id number]   cc:   Maurice March, M.D.  Fax: [phone]

## 2011-05-08 ENCOUNTER — Encounter: Payer: Self-pay | Admitting: Internal Medicine

## 2011-05-20 ENCOUNTER — Other Ambulatory Visit: Payer: Self-pay | Admitting: *Deleted

## 2011-05-20 DIAGNOSIS — K219 Gastro-esophageal reflux disease without esophagitis: Secondary | ICD-10-CM

## 2011-05-20 DIAGNOSIS — Z0000 Encounter for general adult medical examination without abnormal findings: Secondary | ICD-10-CM

## 2011-05-20 DIAGNOSIS — J302 Other seasonal allergic rhinitis: Secondary | ICD-10-CM

## 2011-05-20 DIAGNOSIS — D649 Anemia, unspecified: Secondary | ICD-10-CM

## 2011-05-20 DIAGNOSIS — G8929 Other chronic pain: Secondary | ICD-10-CM

## 2011-05-20 DIAGNOSIS — B20 Human immunodeficiency virus [HIV] disease: Secondary | ICD-10-CM

## 2011-05-20 MED ORDER — CETIRIZINE HCL 10 MG PO TABS: 10.0000 mg | ORAL_TABLET | Freq: Every day | ORAL | Status: DC

## 2011-05-20 MED ORDER — OMEPRAZOLE 20 MG PO CPDR: 20.0000 mg | DELAYED_RELEASE_CAPSULE | Freq: Every day | ORAL | Status: DC

## 2011-05-20 MED ORDER — ASPIRIN 81 MG PO TBEC: 81.0000 mg | DELAYED_RELEASE_TABLET | Freq: Every day | ORAL | Status: DC

## 2011-05-20 MED ORDER — FERROUS SULFATE 325 (65 FE) MG PO TABS
325.0000 mg | ORAL_TABLET | Freq: Every day | ORAL | Status: DC
Start: 2011-05-20 — End: 2011-06-30

## 2011-05-20 MED ORDER — EFAVIRENZ-EMTRICITAB-TENOFOVIR 600-200-300 MG PO TABS: 1.0000 | ORAL_TABLET | Freq: Every day | ORAL | Status: DC

## 2011-05-20 MED ORDER — DOCUSATE SODIUM 100 MG PO CAPS: 100.0000 mg | ORAL_CAPSULE | Freq: Every day | ORAL | Status: DC

## 2011-05-20 MED ORDER — DESIPRAMINE HCL 25 MG PO TABS: 25.0000 mg | ORAL_TABLET | Freq: Every day | ORAL | Status: DC

## 2011-05-25 ENCOUNTER — Other Ambulatory Visit: Payer: Self-pay | Admitting: Licensed Clinical Social Worker

## 2011-05-25 DIAGNOSIS — G4700 Insomnia, unspecified: Secondary | ICD-10-CM

## 2011-05-25 MED ORDER — TRAZODONE HCL 100 MG PO TABS: 100.0000 mg | ORAL_TABLET | Freq: Every day | ORAL | Status: DC

## 2011-05-26 ENCOUNTER — Other Ambulatory Visit: Payer: Self-pay | Admitting: Licensed Clinical Social Worker

## 2011-05-26 DIAGNOSIS — G4700 Insomnia, unspecified: Secondary | ICD-10-CM

## 2011-05-26 DIAGNOSIS — K861 Other chronic pancreatitis: Secondary | ICD-10-CM

## 2011-05-26 MED ORDER — TRAZODONE HCL 100 MG PO TABS: 100.0000 mg | ORAL_TABLET | Freq: Every day | ORAL | Status: DC

## 2011-05-26 MED ORDER — PANCRELIPASE (LIP-PROT-AMYL) 6000-19000 UNITS PO CPEP: 2.0000 | ORAL_CAPSULE | Freq: Three times a day (TID) | ORAL | Status: DC

## 2011-05-28 ENCOUNTER — Other Ambulatory Visit: Payer: Self-pay | Admitting: *Deleted

## 2011-05-28 NOTE — Telephone Encounter (Signed)
Opened in error

## 2011-06-01 ENCOUNTER — Other Ambulatory Visit: Payer: Self-pay | Admitting: *Deleted

## 2011-06-01 DIAGNOSIS — B20 Human immunodeficiency virus [HIV] disease: Secondary | ICD-10-CM

## 2011-06-01 DIAGNOSIS — M545 Low back pain: Secondary | ICD-10-CM

## 2011-06-01 DIAGNOSIS — R110 Nausea: Secondary | ICD-10-CM

## 2011-06-01 MED ORDER — IBUPROFEN 600 MG PO TABS: 600.0000 mg | ORAL_TABLET | Freq: Four times a day (QID) | ORAL | Status: AC | PRN

## 2011-06-01 MED ORDER — PREGABALIN 100 MG PO CAPS: 100.0000 mg | ORAL_CAPSULE | Freq: Three times a day (TID) | ORAL | Status: DC

## 2011-06-01 MED ORDER — ONDANSETRON HCL 8 MG PO TABS: 8.0000 mg | ORAL_TABLET | Freq: Two times a day (BID) | ORAL | Status: AC | PRN

## 2011-06-01 MED ORDER — MIRTAZAPINE 15 MG PO TABS: 15.0000 mg | ORAL_TABLET | Freq: Every day | ORAL | Status: DC

## 2011-06-01 NOTE — Telephone Encounter (Addendum)
[facility] pharmacy called and asked about refills for the patient.

## 2011-06-12 NOTE — Progress Notes (Signed)
Addended by: Maura Crandall on: 06/12/2011 02:04 PM   Modules accepted: Orders

## 2011-06-24 ENCOUNTER — Emergency Department (HOSPITAL_COMMUNITY): Payer: Medicaid Other

## 2011-06-24 ENCOUNTER — Emergency Department (HOSPITAL_COMMUNITY)
Admission: EM | Admit: 2011-06-24 | Discharge: 2011-06-24 | Disposition: A | Discharge status: Other Acute Inpatient Hospital | Payer: Medicaid Other | Attending: Emergency Medicine | Admitting: Emergency Medicine

## 2011-06-24 ENCOUNTER — Encounter (HOSPITAL_COMMUNITY): Payer: Self-pay | Admitting: *Deleted

## 2011-06-24 DIAGNOSIS — M25519 Pain in unspecified shoulder: Secondary | ICD-10-CM | POA: Insufficient documentation

## 2011-06-24 DIAGNOSIS — S42209A Unspecified fracture of upper end of unspecified humerus, initial encounter for closed fracture: Secondary | ICD-10-CM | POA: Insufficient documentation

## 2011-06-24 DIAGNOSIS — F17200 Nicotine dependence, unspecified, uncomplicated: Secondary | ICD-10-CM | POA: Insufficient documentation

## 2011-06-24 HISTORY — DX: Unspecified viral hepatitis C without hepatic coma: B19.20

## 2011-06-24 HISTORY — DX: Transient cerebral ischemic attack, unspecified: G45.9

## 2011-06-24 HISTORY — DX: Unspecified mood (affective) disorder: F39

## 2011-06-24 HISTORY — DX: Other chronic pancreatitis: K86.1

## 2011-06-24 MED ORDER — HYDROMORPHONE HCL 2 MG PO TABS: 2.0000 mg | ORAL_TABLET | ORAL | Status: AC | PRN

## 2011-06-24 MED ORDER — ONDANSETRON 8 MG PO TBDP
8.0000 mg | ORAL_TABLET | Freq: Once | ORAL | Status: AC
  Administered 2011-06-24: 8 mg via ORAL
  Filled 2011-06-24: qty 1

## 2011-06-24 MED ORDER — HYDROMORPHONE HCL 1 MG/ML IJ SOLN
1.0000 mg | Freq: Once | INTRAMUSCULAR | Status: AC
  Administered 2011-06-24: 1 mg via INTRAMUSCULAR
  Filled 2011-06-24: qty 1
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
## 2011-06-24 NOTE — ED Notes (Signed)
Ice pack placed to left arm
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
## 2011-06-24 NOTE — ED Notes (Signed)
[facility] given to [facility], advised that they would send someone to pick pt up,

## 2011-06-25 ENCOUNTER — Encounter: Payer: Self-pay | Admitting: Orthopedic Surgery

## 2011-06-25 ENCOUNTER — Ambulatory Visit (INDEPENDENT_AMBULATORY_CARE_PROVIDER_SITE_OTHER): Payer: Medicaid Other | Admitting: Orthopedic Surgery

## 2011-06-25 VITALS — Resp 18 | Ht 64.0 in | Wt 150.0 lb

## 2011-06-25 DIAGNOSIS — G8929 Other chronic pain: Secondary | ICD-10-CM | POA: Insufficient documentation

## 2011-06-25 DIAGNOSIS — S42209A Unspecified fracture of upper end of unspecified humerus, initial encounter for closed fracture: Secondary | ICD-10-CM | POA: Insufficient documentation

## 2011-06-25 NOTE — Progress Notes (Signed)
Pain left ankle   HPI:
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
## 2011-06-25 NOTE — Progress Notes (Signed)
m °

## 2011-06-29 ENCOUNTER — Telehealth: Payer: Self-pay | Admitting: Orthopedic Surgery
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
## 2011-06-30 ENCOUNTER — Other Ambulatory Visit: Payer: Self-pay | Admitting: Licensed Clinical Social Worker

## 2011-06-30 DIAGNOSIS — D649 Anemia, unspecified: Secondary | ICD-10-CM

## 2011-06-30 MED ORDER — FERROUS SULFATE 325 (65 FE) MG PO TABS: 325.0000 mg | ORAL_TABLET | Freq: Every day | ORAL | Status: DC

## 2011-07-01 NOTE — Telephone Encounter (Signed)
07/01/11 Marita from [facility] called to follow up on the Rx request.  Their pharmacy, [facility] in [place], is sending a fax request.  Per [facility], patient was originally prescribed Rx or Hydromorphone 2 mg, 1 tablet by mouth every 4 hrs, per the Emergency Room physician.  Fax to follow.

## 2011-07-01 NOTE — Telephone Encounter (Signed)
Called back to [place] at [facility] ph [phone] and relayed.

## 2011-07-01 NOTE — Telephone Encounter (Signed)
We do not prescribe hydromorphone but hydrocodone is ok

## 2011-07-09 ENCOUNTER — Other Ambulatory Visit: Payer: Self-pay

## 2011-07-09 ENCOUNTER — Ambulatory Visit (INDEPENDENT_AMBULATORY_CARE_PROVIDER_SITE_OTHER): Payer: Medicaid Other | Admitting: Orthopedic Surgery

## 2011-07-09 DIAGNOSIS — S42209A Unspecified fracture of upper end of unspecified humerus, initial encounter for closed fracture: Secondary | ICD-10-CM

## 2011-07-09 MED ORDER — HYDROCODONE-ACETAMINOPHEN 5-325 MG PO TABS: 1.0000 | ORAL_TABLET | Freq: Four times a day (QID) | ORAL | Status: DC | PRN
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
## 2011-07-09 NOTE — Patient Instructions (Signed)
Come back in 6 weeks   Wear sling for 2 more weeks, take off to bathe and for meals now.  Start PT ASAP.

## 2011-07-20 ENCOUNTER — Other Ambulatory Visit: Payer: Self-pay | Admitting: *Deleted

## 2011-07-20 DIAGNOSIS — S42209A Unspecified fracture of upper end of unspecified humerus, initial encounter for closed fracture: Secondary | ICD-10-CM

## 2011-07-20 MED ORDER — HYDROCODONE-ACETAMINOPHEN 5-325 MG PO TABS: 1.0000 | ORAL_TABLET | Freq: Four times a day (QID) | ORAL | Status: DC | PRN

## 2011-07-23 ENCOUNTER — Telehealth: Payer: Self-pay | Admitting: *Deleted

## 2011-07-23 NOTE — Telephone Encounter (Signed)
Call from [facility] with [facility].  She wanted you to be aware pt is receiving home health services.  Started on 8/7. Therapy is going out to see her. She will be sending out paperwork, vital signs etc.  [phone]

## 2011-07-23 NOTE — Telephone Encounter (Signed)
Noted  

## 2011-07-23 NOTE — Telephone Encounter (Signed)
Pt was referred to [facility]   by Dr Romeo Apple because of left arm  fx . Pt receiving Physical and Occ. Therapy. She is in facility, "[facility]"  in [place]. ( assisten living  [phone] )  They are calling to make [place] aware of elevation  VS readings  8/7 Heart Rate 104  BP  110/74, yesterday  HR 119  BP 122/74 . States HR  is regular.  This was done by Physical Therapy Assistant.

## 2011-07-23 NOTE — Telephone Encounter (Signed)
Last office visit in clinic was 4/18 and pt BP 123/81  Heart rate was 112 !!

## 2011-07-28 ENCOUNTER — Other Ambulatory Visit: Payer: Self-pay | Admitting: Licensed Clinical Social Worker

## 2011-07-28 DIAGNOSIS — B20 Human immunodeficiency virus [HIV] disease: Secondary | ICD-10-CM

## 2011-07-28 MED ORDER — EFAVIRENZ-EMTRICITAB-TENOFOVIR 600-200-300 MG PO TABS: 1.0000 | ORAL_TABLET | Freq: Every day | ORAL | Status: DC

## 2011-07-28 MED ORDER — MIRTAZAPINE 15 MG PO TABS: 15.0000 mg | ORAL_TABLET | Freq: Every day | ORAL | Status: DC

## 2011-08-03 ENCOUNTER — Telehealth: Payer: Self-pay | Admitting: Orthopedic Surgery

## 2011-08-03 NOTE — Telephone Encounter (Signed)
Kathlene November, therapist with [facility] called to let you know that Medicaid has denied any further OT sessions for [employer].  It is now in appeals, hopes to get more ok'd soon .  [facility] # [phone]

## 2011-08-11 ENCOUNTER — Other Ambulatory Visit: Payer: Self-pay | Admitting: *Deleted

## 2011-08-11 ENCOUNTER — Other Ambulatory Visit (INDEPENDENT_AMBULATORY_CARE_PROVIDER_SITE_OTHER): Payer: Medicaid Other

## 2011-08-11 ENCOUNTER — Other Ambulatory Visit: Payer: Self-pay | Admitting: Infectious Disease

## 2011-08-11 ENCOUNTER — Other Ambulatory Visit: Payer: Self-pay | Admitting: Adult Health

## 2011-08-11 DIAGNOSIS — B20 Human immunodeficiency virus [HIV] disease: Secondary | ICD-10-CM

## 2011-08-12 LAB — COMPREHENSIVE METABOLIC PANEL
ALT: 26 U/L (ref 0–35)
Albumin: 3.5 g/dL (ref 3.5–5.2)
CO2: 19 mEq/L (ref 19–32)
Calcium: 8.8 mg/dL (ref 8.4–10.5)
Chloride: 110 mEq/L (ref 96–112)
Glucose, Bld: 107 mg/dL — ABNORMAL HIGH (ref 70–99)
Sodium: 139 mEq/L (ref 135–145)
Total Protein: 7.1 g/dL (ref 6.0–8.3)

## 2011-08-12 LAB — CBC WITH DIFFERENTIAL/PLATELET
Basophils Absolute: 0.1 10*3/uL (ref 0.0–0.1)
Eosinophils Relative: 3 % (ref 0–5)
Lymphocytes Relative: 41 % (ref 12–46)
Lymphs Abs: 2.3 10*3/uL (ref 0.7–4.0)
Neutro Abs: 2.4 10*3/uL (ref 1.7–7.7)
Neutrophils Relative %: 43 % (ref 43–77)
Platelets: 257 10*3/uL (ref 150–400)
RBC: 3.51 MIL/uL — ABNORMAL LOW (ref 3.87–5.11)
RDW: 15.6 % — ABNORMAL HIGH (ref 11.5–15.5)
WBC: 5.6 10*3/uL (ref 4.0–10.5)

## 2011-08-12 LAB — HIV-1 RNA QUANT-NO REFLEX-BLD: HIV-1 RNA Quant, Log: <1.3 {Log} (ref <1.30)

## 2011-08-12 LAB — T-HELPER CELL (CD4) - (RCID CLINIC ONLY): CD4 T Cell Abs: 700 uL (ref 400–2700)

## 2011-08-13 ENCOUNTER — Telehealth: Payer: Self-pay | Admitting: *Deleted

## 2011-08-13 NOTE — Telephone Encounter (Signed)
Ibuprofen not currently on the pt's med profile.  Refill request fax given to B. Sundra Aland, NP to evaluate.  Jennet Maduro, RN

## 2011-08-20 ENCOUNTER — Encounter: Payer: Self-pay | Admitting: Orthopedic Surgery

## 2011-08-20 ENCOUNTER — Ambulatory Visit (INDEPENDENT_AMBULATORY_CARE_PROVIDER_SITE_OTHER): Payer: Medicaid Other | Admitting: Orthopedic Surgery

## 2011-08-20 VITALS — Ht 64.0 in | Wt 150.0 lb

## 2011-08-20 DIAGNOSIS — S42209A Unspecified fracture of upper end of unspecified humerus, initial encounter for closed fracture: Secondary | ICD-10-CM

## 2011-08-20 MED ORDER — HYDROCODONE-ACETAMINOPHEN 5-325 MG PO TABS: 1.0000 | ORAL_TABLET | Freq: Four times a day (QID) | ORAL | Status: DC | PRN
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
## 2011-08-20 NOTE — Patient Instructions (Signed)
Attention: You have  indicated that you are still smoking.  If you are interested in quitting please see your primary care physician regarding measures you can take to improve your health and stop smoking.  

## 2011-08-24 ENCOUNTER — Ambulatory Visit (INDEPENDENT_AMBULATORY_CARE_PROVIDER_SITE_OTHER): Payer: Medicaid Other | Admitting: Adult Health

## 2011-08-24 ENCOUNTER — Encounter: Payer: Self-pay | Admitting: Adult Health

## 2011-08-24 ENCOUNTER — Other Ambulatory Visit: Payer: Self-pay | Admitting: Adult Health

## 2011-08-24 VITALS — BP 119/78 | HR 116 | Temp 97.9°F | Ht 64.0 in | Wt 150.0 lb

## 2011-08-24 DIAGNOSIS — S42309A Unspecified fracture of shaft of humerus, unspecified arm, initial encounter for closed fracture: Secondary | ICD-10-CM

## 2011-08-24 DIAGNOSIS — Z79899 Other long term (current) drug therapy: Secondary | ICD-10-CM

## 2011-08-24 DIAGNOSIS — B20 Human immunodeficiency virus [HIV] disease: Secondary | ICD-10-CM

## 2011-08-24 DIAGNOSIS — Z23 Encounter for immunization: Secondary | ICD-10-CM

## 2011-08-24 DIAGNOSIS — B1710 Acute hepatitis C without hepatic coma: Secondary | ICD-10-CM

## 2011-08-24 DIAGNOSIS — E109 Type 1 diabetes mellitus without complications: Secondary | ICD-10-CM

## 2011-08-31 ENCOUNTER — Ambulatory Visit
Admission: RE | Admit: 2011-08-31 | Discharge: 2011-08-31 | Disposition: A | Discharge status: Home / Self Care | Payer: Medicaid Other | Source: Ambulatory Visit | Attending: Adult Health | Admitting: Adult Health

## 2011-08-31 DIAGNOSIS — S42309A Unspecified fracture of shaft of humerus, unspecified arm, initial encounter for closed fracture: Secondary | ICD-10-CM

## 2011-09-01 LAB — T-HELPER CELL (CD4) - (RCID CLINIC ONLY): CD4 T Cell Abs: 230 — ABNORMAL LOW

## 2011-09-03 LAB — CBC
Hemoglobin: 12.5
MCHC: 33.9
Platelets: 223
RDW: 12.5

## 2011-09-03 LAB — COMPREHENSIVE METABOLIC PANEL
ALT: 65 — ABNORMAL HIGH
Albumin: 3 — ABNORMAL LOW
BUN: 11
Calcium: 9.1
Glucose, Bld: 230 — ABNORMAL HIGH
Sodium: 139
Total Protein: 7.5

## 2011-09-03 LAB — PROTIME-INR
INR: 0.9
Prothrombin Time: 11.8

## 2011-09-03 NOTE — Telephone Encounter (Signed)
Corley has seen Dr. Romeo Apple for an appointment since this note

## 2011-09-04 LAB — T-HELPER CELL (CD4) - (RCID CLINIC ONLY)
CD4 % Helper T Cell: 21 — ABNORMAL LOW
CD4 T Cell Abs: 280 — ABNORMAL LOW

## 2011-09-07 ENCOUNTER — Other Ambulatory Visit: Payer: Self-pay | Admitting: *Deleted

## 2011-09-07 DIAGNOSIS — G8929 Other chronic pain: Secondary | ICD-10-CM

## 2011-09-07 DIAGNOSIS — D649 Anemia, unspecified: Secondary | ICD-10-CM

## 2011-09-07 DIAGNOSIS — M545 Low back pain, unspecified: Secondary | ICD-10-CM

## 2011-09-07 DIAGNOSIS — K861 Other chronic pancreatitis: Secondary | ICD-10-CM

## 2011-09-07 DIAGNOSIS — B20 Human immunodeficiency virus [HIV] disease: Secondary | ICD-10-CM

## 2011-09-07 MED ORDER — DESIPRAMINE HCL 25 MG PO TABS: 25.0000 mg | ORAL_TABLET | Freq: Every day | ORAL | Status: DC

## 2011-09-07 MED ORDER — PANCRELIPASE (LIP-PROT-AMYL) 6000-19000 UNITS PO CPEP: 2.0000 | ORAL_CAPSULE | Freq: Three times a day (TID) | ORAL | Status: DC

## 2011-09-07 MED ORDER — FERROUS SULFATE 325 (65 FE) MG PO TABS: 325.0000 mg | ORAL_TABLET | Freq: Every day | ORAL | Status: DC

## 2011-09-07 MED ORDER — EFAVIRENZ-EMTRICITAB-TENOFOVIR 600-200-300 MG PO TABS: 1.0000 | ORAL_TABLET | Freq: Every day | ORAL | Status: DC

## 2011-09-07 MED ORDER — PREGABALIN 100 MG PO CAPS: 100.0000 mg | ORAL_CAPSULE | Freq: Three times a day (TID) | ORAL | Status: DC

## 2011-09-09 LAB — DIFFERENTIAL
Basophils Relative: 1
Eosinophils Absolute: 0.1
Eosinophils Relative: 2
Monocytes Relative: 10
Neutrophils Relative %: 66

## 2011-09-09 LAB — URINALYSIS, ROUTINE W REFLEX MICROSCOPIC
Hgb urine dipstick: NEGATIVE
Ketones, ur: NEGATIVE
Nitrite: NEGATIVE
Protein, ur: NEGATIVE
Specific Gravity, Urine: 1.024
Urobilinogen, UA: 4 — ABNORMAL HIGH

## 2011-09-09 LAB — POCT I-STAT, CHEM 8
Calcium, Ion: 1.15
Glucose, Bld: 123 — ABNORMAL HIGH
HCT: 39
Hemoglobin: 13.3
Potassium: 3.3 — ABNORMAL LOW
TCO2: 25

## 2011-09-09 LAB — CBC
MCHC: 33.2
MCV: 87
Platelets: 276

## 2011-09-09 LAB — CK: Total CK: 100

## 2011-09-14 LAB — CBC
HCT: 36.6
Hemoglobin: 12
MCHC: 32.8
MCV: 89.4
RBC: 4.09
RDW: 13.6

## 2011-09-14 LAB — DIFFERENTIAL
Basophils Absolute: 0
Basophils Relative: 0
Eosinophils Relative: 0
Monocytes Absolute: 0.4
Monocytes Relative: 10

## 2011-09-14 LAB — URINALYSIS, ROUTINE W REFLEX MICROSCOPIC
Hgb urine dipstick: NEGATIVE
Protein, ur: NEGATIVE
Specific Gravity, Urine: 1.019
Urobilinogen, UA: 1

## 2011-09-14 LAB — I-STAT 8, (EC8 V) (CONVERTED LAB)
BUN: 11
Bicarbonate: 23.6
Chloride: 109
Glucose, Bld: 129 — ABNORMAL HIGH
HCT: 42
Hemoglobin: 14.3
Operator id: 284141
Sodium: 142
pCO2, Ven: 50.3 — ABNORMAL HIGH

## 2011-09-14 LAB — URINE MICROSCOPIC-ADD ON

## 2011-09-15 ENCOUNTER — Telehealth: Payer: Self-pay | Admitting: *Deleted

## 2011-09-15 NOTE — Telephone Encounter (Signed)
rec'd a fax stating pt was refusing meds. She resides at [facility]. I called & spoke with Felipa Eth, the med tech. She relates the pt c/o not feeling well & having a "stomach virus". She refused meds Sunday & Monday. She is feeling better now & has taken her meds today

## 2011-09-21 LAB — DIFFERENTIAL
Basophils Absolute: 0
Basophils Absolute: 0
Basophils Relative: 1
Basophils Relative: 1
Eosinophils Absolute: 0
Eosinophils Absolute: 0
Eosinophils Relative: 1
Eosinophils Relative: 1
Lymphocytes Relative: 39
Lymphs Abs: 1.4
Lymphs Abs: 1.4
Monocytes Absolute: 0.4
Monocytes Relative: 10
Neutro Abs: 1.7
Neutrophils Relative %: 49
Neutrophils Relative %: 53

## 2011-09-21 LAB — URINALYSIS, ROUTINE W REFLEX MICROSCOPIC
Bilirubin Urine: NEGATIVE
Glucose, UA: >1000 — AB
Hgb urine dipstick: NEGATIVE
Ketones, ur: NEGATIVE
Nitrite: NEGATIVE
Protein, ur: NEGATIVE
Specific Gravity, Urine: 1.038 — ABNORMAL HIGH
Urobilinogen, UA: 0.2
pH: 6.5

## 2011-09-21 LAB — COMPREHENSIVE METABOLIC PANEL
ALT: 80 — ABNORMAL HIGH
AST: 77 — ABNORMAL HIGH
Albumin: 3.2 — ABNORMAL LOW
Alkaline Phosphatase: 56
BUN: 10
CO2: 28
Calcium: 9.9
Chloride: 101
Creatinine, Ser: 0.85
GFR calc Af Amer: >60
GFR calc non Af Amer: >60
Glucose, Bld: 475 — ABNORMAL HIGH
Potassium: 4
Sodium: 139
Total Bilirubin: 0.3
Total Protein: 7.3

## 2011-09-21 LAB — GLUCOSE, RANDOM
Glucose, Bld: 459 — ABNORMAL HIGH
Glucose, Bld: 530

## 2011-09-21 LAB — CBC
HCT: 36.6
HCT: 37.2
Hemoglobin: 12.3
MCHC: 33.6
MCHC: 34
MCV: 88.2
MCV: 88.4
Platelets: 197
Platelets: 198
RBC: 4.14
RDW: 13.7
RDW: 13.9
WBC: 3.6 — ABNORMAL LOW

## 2011-09-21 LAB — BASIC METABOLIC PANEL
BUN: 4 — ABNORMAL LOW
CO2: 24
Chloride: 96
Creatinine, Ser: 0.66
Glucose, Bld: 527
Potassium: 3.3 — ABNORMAL LOW

## 2011-09-21 LAB — URINE MICROSCOPIC-ADD ON

## 2011-09-21 LAB — MAGNESIUM: Magnesium: 1.6

## 2011-09-21 LAB — RAPID URINE DRUG SCREEN, HOSP PERFORMED
Amphetamines: NOT DETECTED
Barbiturates: NOT DETECTED

## 2011-09-21 LAB — T-HELPER CELLS (CD4) COUNT (NOT AT ARMC): CD4 % Helper T Cell: 25 — ABNORMAL LOW

## 2011-09-21 LAB — TSH
TSH: 1.314
TSH: 2.987

## 2011-09-21 LAB — RPR: RPR Ser Ql: REACTIVE — AB

## 2011-09-21 LAB — LIPASE, BLOOD: Lipase: 17

## 2011-09-22 ENCOUNTER — Encounter: Payer: Self-pay | Admitting: Orthopedic Surgery

## 2011-09-22 ENCOUNTER — Ambulatory Visit (INDEPENDENT_AMBULATORY_CARE_PROVIDER_SITE_OTHER): Payer: Medicaid Other | Admitting: Orthopedic Surgery

## 2011-09-22 VITALS — BP 120/80 | Ht 64.0 in | Wt 150.0 lb

## 2011-09-22 DIAGNOSIS — S42209A Unspecified fracture of upper end of unspecified humerus, initial encounter for closed fracture: Secondary | ICD-10-CM

## 2011-09-22 NOTE — Patient Instructions (Signed)
Re order therapy   New diagnosis adhesive capsulitis left shoulder
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
## 2011-09-25 ENCOUNTER — Telehealth: Payer: Self-pay | Admitting: *Deleted

## 2011-09-25 NOTE — Telephone Encounter (Signed)
Phone call to [name].  Pt has "cold" symptoms.  Facility reports that the pt does not have a fever.  [name] is giving the pt something for the cough and the pt does not have a cough this morning.  RN advised facility to monitor the pt for a fever and if it goes above 100.5 or symptoms worsen they should have the pt evaluated at [name].  [facility] employee verbalized understanding.

## 2011-09-28 ENCOUNTER — Telehealth: Payer: Self-pay | Admitting: Orthopedic Surgery

## 2011-09-28 NOTE — Telephone Encounter (Signed)
Note: Left proximal humerus fracture, original DOI 06/24/11
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
## 2011-09-30 ENCOUNTER — Ambulatory Visit (INDEPENDENT_AMBULATORY_CARE_PROVIDER_SITE_OTHER): Payer: Medicaid Other | Admitting: Infectious Disease

## 2011-09-30 ENCOUNTER — Ambulatory Visit
Admission: RE | Admit: 2011-09-30 | Discharge: 2011-09-30 | Disposition: A | Discharge status: Home / Self Care | Payer: Medicaid Other | Source: Ambulatory Visit | Attending: Infectious Disease | Admitting: Infectious Disease

## 2011-09-30 ENCOUNTER — Telehealth: Payer: Self-pay | Admitting: *Deleted

## 2011-09-30 ENCOUNTER — Encounter: Payer: Self-pay | Admitting: Infectious Disease

## 2011-09-30 DIAGNOSIS — E109 Type 1 diabetes mellitus without complications: Secondary | ICD-10-CM

## 2011-09-30 DIAGNOSIS — B20 Human immunodeficiency virus [HIV] disease: Secondary | ICD-10-CM

## 2011-09-30 DIAGNOSIS — R05 Cough: Secondary | ICD-10-CM | POA: Insufficient documentation

## 2011-09-30 DIAGNOSIS — S42209A Unspecified fracture of upper end of unspecified humerus, initial encounter for closed fracture: Secondary | ICD-10-CM

## 2011-09-30 DIAGNOSIS — Z21 Asymptomatic human immunodeficiency virus [HIV] infection status: Secondary | ICD-10-CM

## 2011-09-30 LAB — COMPLETE METABOLIC PANEL WITH GFR
ALT: 17 U/L (ref 0–35)
AST: 20 U/L (ref 0–37)
CO2: 20 mEq/L (ref 19–32)
GFR, Est African American: 76 mL/min — ABNORMAL LOW (ref >90)
Sodium: 136 mEq/L (ref 135–145)
Total Bilirubin: 0.3 mg/dL (ref 0.3–1.2)
Total Protein: 7.2 g/dL (ref 6.0–8.3)

## 2011-09-30 LAB — CBC WITH DIFFERENTIAL/PLATELET
Eosinophils Absolute: 0.2 10*3/uL (ref 0.0–0.7)
Lymphocytes Relative: 28 % (ref 12–46)
Lymphs Abs: 2 10*3/uL (ref 0.7–4.0)
MCH: 29.3 pg (ref 26.0–34.0)
Neutrophils Relative %: 59 % (ref 43–77)
Platelets: 256 10*3/uL (ref 150–400)
RBC: 3.86 MIL/uL — ABNORMAL LOW (ref 3.87–5.11)
WBC: 7.2 10*3/uL (ref 4.0–10.5)

## 2011-09-30 MED ORDER — AZITHROMYCIN 1 G PO PACK: 1.0000 | PACK | Freq: Once | ORAL | Status: AC

## 2011-09-30 NOTE — Assessment & Plan Note (Signed)
Check cxr and labs. WIll give a zpack

## 2011-09-30 NOTE — Assessment & Plan Note (Signed)
Managed by Dr Starling Manns

## 2011-09-30 NOTE — Telephone Encounter (Signed)
rec'd the faxed order & gave it to md to review & clarify

## 2011-09-30 NOTE — Assessment & Plan Note (Signed)
I have encouraged her to schedule followup with Dr. Arvilla Market in [facility] for management of her Diabetes and other medical issues, she seems to think she is no longer a pt in [facility]
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
## 2011-09-30 NOTE — Telephone Encounter (Signed)
rec'd a call from the pharmacy asking for clarification on the support stockings that was rx's today. No mention in the office visit. Asked her to fax it here & I will check with md for diagnosis & length.

## 2011-09-30 NOTE — Assessment & Plan Note (Signed)
Continue atripla, superb job!

## 2011-10-01 ENCOUNTER — Other Ambulatory Visit: Payer: Self-pay | Admitting: Orthopedic Surgery

## 2011-10-01 DIAGNOSIS — S42309A Unspecified fracture of shaft of humerus, unspecified arm, initial encounter for closed fracture: Secondary | ICD-10-CM

## 2011-10-01 NOTE — Telephone Encounter (Signed)
Order for out-patient physical therapy completed and faxed to [facility] and copy to [facility] fax [phone] (spoke with Tobi Bastos, med tech there.)

## 2011-10-02 LAB — TPPA: Treponema Confirm: REACTIVE — AB

## 2011-10-05 ENCOUNTER — Ambulatory Visit (HOSPITAL_COMMUNITY)
Admission: RE | Admit: 2011-10-05 | Discharge: 2011-10-05 | Disposition: A | Discharge status: Home / Self Care | Payer: Medicaid Other | Source: Ambulatory Visit | Attending: Orthopedic Surgery | Admitting: Orthopedic Surgery

## 2011-10-05 DIAGNOSIS — M25629 Stiffness of unspecified elbow, not elsewhere classified: Secondary | ICD-10-CM | POA: Insufficient documentation

## 2011-10-05 DIAGNOSIS — IMO0001 Reserved for inherently not codable concepts without codable children: Secondary | ICD-10-CM | POA: Insufficient documentation

## 2011-10-05 DIAGNOSIS — S42209A Unspecified fracture of upper end of unspecified humerus, initial encounter for closed fracture: Secondary | ICD-10-CM

## 2011-10-05 DIAGNOSIS — M25519 Pain in unspecified shoulder: Secondary | ICD-10-CM | POA: Insufficient documentation

## 2011-10-05 DIAGNOSIS — M6281 Muscle weakness (generalized): Secondary | ICD-10-CM | POA: Insufficient documentation

## 2011-10-05 DIAGNOSIS — E119 Type 2 diabetes mellitus without complications: Secondary | ICD-10-CM | POA: Insufficient documentation
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
## 2011-10-07 ENCOUNTER — Ambulatory Visit (HOSPITAL_COMMUNITY)
Admission: RE | Admit: 2011-10-07 | Discharge: 2011-10-07 | Disposition: A | Discharge status: Home / Self Care | Payer: Medicaid Other | Source: Ambulatory Visit | Attending: Internal Medicine | Admitting: Internal Medicine

## 2011-10-07 DIAGNOSIS — M6281 Muscle weakness (generalized): Secondary | ICD-10-CM

## 2011-10-07 DIAGNOSIS — M25519 Pain in unspecified shoulder: Secondary | ICD-10-CM

## 2011-10-07 NOTE — Progress Notes (Signed)
Occupational Therapy Treatment  Patient Details  Name: [name] MRN: [id number] Date of Birth: 01-Sep-1956  Today's Date: 10/07/2011 Time: 4098-1191 Time Calculation (min): 38 min Visit#: 2  of 16   Re-eval: 11/02/11 [facility]  [id number]  23' Therapeutic Exercises 1050-1104  14' Subjective Symptoms/Limitations Symptoms: S:  I thought I could do some stuff I used to do so now it is sore again. Pain Assessment Currently in Pain?: Yes Pain Score:   6 Pain Location: Shoulder Pain Orientation: Left Pain Type: Acute pain   Exercise/Treatments Supine Protraction: PROM;AAROM;10 reps Horizontal ABduction: PROM;AROM;10 reps External Rotation: PROM;AAROM;10 reps Internal Rotation: PROM;AAROM;10 reps Flexion: PROM;AROM;10 reps ABduction: PROM;AROM;10 reps Seated Elevation: AROM;10 reps Extension: AROM;10 reps Retraction: AROM;10 reps Row: AROM;10 reps Pulleys Flexion: 2 minutes ABduction: 2 minutes Therapy Ball Flexion: 15 reps ABduction: 15 reps  Manual Therapy Myofascial Release: MFR and manual stretching to left upper arm, scapular, and shoulder region to decrease pain and increase mobility in left shoulder.  Occupational Therapy Assessment and Plan OT Assessment and Plan Clinical Impression Statement: A:  Added multiple new exercises which patient tolerated well. Pain decreased to 4/10 after manuel.  Multiple spasms felt in scapula area. Rehab Potential: Good OT Plan: P:  Increase reps with dowel ex.   Goals Short Term Goals Time to Complete Short Term Goals: 4 weeks Short Term Goal 1: Patient will be educated on HEP. Short Term Goal 2: Patient will increase PROM to [facility] in left shoulder for increased I with combing hair. Short Term Goal 3: Patient will increase left shoulder strength to 3+/5 for increased I with reaching into overhead cabinet in her room. Short Term Goal 4: Patient will decrease pain in left shoulder to 3/10 while combing her hair. Short  Term Goal 5: Patient will decrease fasciala restriction to moderate in her left shoulder region. Long Term Goals Time to Complete Long Term Goals: 8 weeks Long Term Goal 1: Patient will return to prior level of independence with all B/IADLs and leisure activities. Long Term Goal 2: Patient will increase AROM to [facility] for increased independence iwth combing and braiding her hair. Long Term Goal 3: Patient will increase left shoulder strength to 4/5 for increased ability to lift clothes into cabinet. Long Term Goal 4: Patient will decrese fascial restrictions to minimal in her left shoulder region. Long Term Goal 5: Patient will decrease pain to 1/10 in  her left shoulder region. End of Session Patient Active Problem List  Diagnoses  . HIV INFECTION  . HEPATITIS C  . IDDM  . DEPRESSION  . OTHER CHRONIC PAIN  . PVD WITH CLAUDICATION  . GERD  . CHRONIC PANCREATITIS  . UNSPECIFIED PRURITIC DISORDER  . THORACIC/LUMBOSACRAL NEURITIS/RADICULITIS UNSPEC  . ALCOHOL ABUSE, HX OF  . COCAINE ABUSE, HX OF  . Claudication of calf muscles  . Chronic pain  . Proximal humerus fracture  . Fracture, humerus, proximal  . Cough  . Pain in joint, shoulder region  . Muscle weakness (generalized)   End of Session Activity Tolerance: Patient tolerated treatment well General Behavior During Session: [facility] for tasks performed Cognition: [facility] for tasks performed   Konrad Hoak L. Lima Chillemi, COTA/L  10/07/2011, 11:05 AM

## 2011-10-13 ENCOUNTER — Ambulatory Visit (HOSPITAL_COMMUNITY)
Admission: RE | Admit: 2011-10-13 | Discharge: 2011-10-13 | Disposition: A | Discharge status: Home / Self Care | Payer: Medicaid Other | Source: Ambulatory Visit | Attending: Internal Medicine | Admitting: Internal Medicine

## 2011-10-13 DIAGNOSIS — IMO0001 Reserved for inherently not codable concepts without codable children: Secondary | ICD-10-CM | POA: Insufficient documentation

## 2011-10-13 DIAGNOSIS — M25629 Stiffness of unspecified elbow, not elsewhere classified: Secondary | ICD-10-CM | POA: Insufficient documentation

## 2011-10-13 DIAGNOSIS — M6281 Muscle weakness (generalized): Secondary | ICD-10-CM | POA: Insufficient documentation

## 2011-10-13 DIAGNOSIS — M25519 Pain in unspecified shoulder: Secondary | ICD-10-CM | POA: Insufficient documentation

## 2011-10-13 DIAGNOSIS — E119 Type 2 diabetes mellitus without complications: Secondary | ICD-10-CM | POA: Insufficient documentation

## 2011-10-13 NOTE — Progress Notes (Signed)
Occupational Therapy Treatment  Patient Details  Name: [name] MRN: [id number] Date of Birth: [birth year]  Today's Date: 10/13/2011 Time: 1020-1103 Time Calculation (min): 43 min Manual Therapy 1020-1038 18' Therapeutic Exercises [phone] 24' Visit#: 3  of 16   Re-eval: 11/02/11    Subjective Symptoms/Limitations Symptoms: S:  I can move my arm a little bit better. Pain Assessment Currently in Pain?: Yes Pain Score:   5 Pain Location: Shoulder Pain Orientation: Left Pain Type: Acute pain  O:  Exercise/Treatments Supine Protraction: PROM;10 reps;AAROM;15 reps Horizontal ABduction: PROM;10 reps;AAROM;15 reps External Rotation: PROM;10 reps;AAROM;15 reps Internal Rotation: PROM;10 reps;AAROM;15 reps Flexion: PROM;10 reps;AAROM;15 reps ABduction: PROM;10 reps;AAROM;15 reps Seated Elevation: AROM;12 reps Extension: AROM;12 reps Retraction: AROM;12 reps Row: AROM;12 reps Pulleys Flexion: 3 minutes ABduction: 3 minutes Therapy Ball Flexion: 20 reps ABduction: 20 reps ROM / Strengthening / Isometric Strengthening Wall Wash: 1' low wall wash Thumb Tacks: 1' Prot/Ret//Elev/Dep: 1'   Manual Therapy Manual Therapy: Myofascial release Myofascial Release: MFR and manual stretching to left upper arm, scapular, and shoulder region to decrease pain and increase mobility in left shoulder.  [id number]  Occupational Therapy Assessment and Plan OT Assessment and Plan Clinical Impression Statement: A:  Significant increase in PROM in supine this date.  Patient able to use LUE to assist with dressing and grooming activities.  Issued medicaid notification of non payment and had patient sign.   OT Plan: P:  Add AROM in supine and seated as tolerated.   Goals Short Term Goals Time to Complete Short Term Goals: 4 weeks Short Term Goal 1: Patient will be educated on HEP. Short Term Goal 1 Progress: Progressing toward goal Short Term Goal 2: Patient will increase PROM to [facility] in  left shoulder for increased I with combing hair. Short Term Goal 2 Progress: Progressing toward goal Short Term Goal 3: Patient will increase left shoulder strength to 3+/5 for increased I with reaching into overhead cabinet in her room. Short Term Goal 3 Progress: Progressing toward goal Short Term Goal 4: Patient will decrease pain in left shoulder to 3/10 while combing her hair. Short Term Goal 4 Progress: Progressing toward goal Short Term Goal 5: Patient will decrease fasciala restriction to moderate in her left shoulder region. Short Term Goal 5 Progress: Progressing toward goal Long Term Goals Time to Complete Long Term Goals: 8 weeks Long Term Goal 1: Patient will return to prior level of independence with all B/IADLs and leisure activities. Long Term Goal 1 Progress: Progressing toward goal Long Term Goal 2: Patient will increase AROM to [facility] for increased independence iwth combing and braiding her hair. Long Term Goal 2 Progress: Progressing toward goal Long Term Goal 3: Patient will increase left shoulder strength to 4/5 for increased ability to lift clothes into cabinet. Long Term Goal 3 Progress: Progressing toward goal Long Term Goal 4: Patient will decrese fascial restrictions to minimal in her left shoulder region. Long Term Goal 4 Progress: Progressing toward goal Long Term Goal 5: Patient will decrease pain to 1/10 in  her left shoulder region. End of Session Patient Active Problem List  Diagnoses  . HIV INFECTION  . HEPATITIS C  . IDDM  . DEPRESSION  . OTHER CHRONIC PAIN  . PVD WITH CLAUDICATION  . GERD  . CHRONIC PANCREATITIS  . UNSPECIFIED PRURITIC DISORDER  . THORACIC/LUMBOSACRAL NEURITIS/RADICULITIS UNSPEC  . ALCOHOL ABUSE, HX OF  . COCAINE ABUSE, HX OF  . Claudication of calf muscles  . Chronic pain  .  Proximal humerus fracture  . Fracture, humerus, proximal  . Cough  . Pain in joint, shoulder region  . Muscle weakness (generalized)   End of  Session Activity Tolerance: Patient tolerated treatment well General Cognition: [facility] for tasks performed   Shirlean Mylar, OTR/L  10/13/2011, 10:58 AM

## 2011-10-16 ENCOUNTER — Ambulatory Visit (HOSPITAL_COMMUNITY)
Admission: RE | Admit: 2011-10-16 | Discharge: 2011-10-16 | Disposition: A | Discharge status: Home / Self Care | Payer: Medicaid Other | Source: Ambulatory Visit | Attending: Specialist | Admitting: Specialist

## 2011-10-16 DIAGNOSIS — M25519 Pain in unspecified shoulder: Secondary | ICD-10-CM

## 2011-10-16 DIAGNOSIS — M6281 Muscle weakness (generalized): Secondary | ICD-10-CM

## 2011-10-16 NOTE — Progress Notes (Signed)
Occupational Therapy Treatment  Patient Details  Name: [name] MRN: [id number] Date of Birth: [birth year]  Today's Date: 10/16/2011 Time: 4098-1191 Time Calculation (min): 50 min Manual Therapy [id number] 18' Therapeutic Exercises [phone] 31' Visit#: 4  of 16   Re-eval: 11/02/11    Subjective Symptoms/Limitations Symptoms: S:  I am doing ok. Pain Assessment Currently in Pain?: Yes Pain Score:   4 Pain Orientation: Left Pain Type: Acute pain  O:  Exercise/Treatments  10/16/11 0700  Shoulder Exercises: Supine  Protraction PROM;AROM;10 reps;AAROM;12 reps  Horizontal ABduction PROM;AROM;10 reps;AAROM;12 reps  External Rotation PROM;AROM;10 reps;AAROM;12 reps  Internal Rotation PROM;AROM;10 reps;AAROM;12 reps  Flexion PROM;AROM;10 reps;AAROM;12 reps  ABduction PROM;AROM;10 reps;AAROM;12 reps  Shoulder Exercises: Seated  Elevation AROM;15 reps  Extension AROM;15 reps  Retraction AROM;15 reps  Row AROM;15 reps  Shoulder Exercises: Pulleys  Flexion 3 minutes  ABduction 3 minutes  Shoulder Exercises: Therapy Ball  Flexion 25 reps  ABduction 25 reps  Right/Left 5 reps  Shoulder Exercises: ROM/Strengthening  Wall Wash 1' low wall wash  Thumb Tacks 1'  Prot/Ret//Elev/Dep 1'   Manual Therapy Manual Therapy: Myofascial release Myofascial Release: MFR and manual stretching to left upper arm, scapular, and shoulder region to decrease pain and increase mobility in left shoulder.[id number]  Occupational Therapy Assessment and Plan OT Assessment and Plan Clinical Impression Statement: A:  Added AROM in supine this date.   OT Plan: P:  Increase to 2 min with wall wash and attempt AROM in seated.   Goals Short Term Goals Time to Complete Short Term Goals: 4 weeks Short Term Goal 1: Patient will be educated on HEP. Short Term Goal 2: Patient will increase PROM to [facility] in left shoulder for increased I with combing hair. Short Term Goal 3: Patient will increase left shoulder  strength to 3+/5 for increased I with reaching into overhead cabinet in her room. Short Term Goal 4: Patient will decrease pain in left shoulder to 3/10 while combing her hair. Short Term Goal 5: Patient will decrease fasciala restriction to moderate in her left shoulder region. Long Term Goals Time to Complete Long Term Goals: 8 weeks Long Term Goal 1: Patient will return to prior level of independence with all B/IADLs and leisure activities. Long Term Goal 2: Patient will increase AROM to [facility] for increased independence iwth combing and braiding her hair. Long Term Goal 3: Patient will increase left shoulder strength to 4/5 for increased ability to lift clothes into cabinet. Long Term Goal 4: Patient will decrese fascial restrictions to minimal in her left shoulder region. Long Term Goal 5: Patient will decrease pain to 1/10 in  her left shoulder region. End of Session Patient Active Problem List  Diagnoses  . HIV INFECTION  . HEPATITIS C  . IDDM  . DEPRESSION  . OTHER CHRONIC PAIN  . PVD WITH CLAUDICATION  . GERD  . CHRONIC PANCREATITIS  . UNSPECIFIED PRURITIC DISORDER  . THORACIC/LUMBOSACRAL NEURITIS/RADICULITIS UNSPEC  . ALCOHOL ABUSE, HX OF  . COCAINE ABUSE, HX OF  . Claudication of calf muscles  . Chronic pain  . Proximal humerus fracture  . Fracture, humerus, proximal  . Cough  . Pain in joint, shoulder region  . Muscle weakness (generalized)   End of Session Activity Tolerance: Patient tolerated treatment well General Behavior During Session: [facility] for tasks performed Cognition: [facility] for tasks performed   Shirlean Mylar, OTR/L  10/16/2011, 11:32 AM

## 2011-10-20 ENCOUNTER — Ambulatory Visit (HOSPITAL_COMMUNITY)
Admission: RE | Admit: 2011-10-20 | Discharge: 2011-10-20 | Disposition: A | Discharge status: Home / Self Care | Payer: Medicaid Other | Source: Ambulatory Visit | Attending: Internal Medicine | Admitting: Internal Medicine

## 2011-10-20 DIAGNOSIS — M25519 Pain in unspecified shoulder: Secondary | ICD-10-CM

## 2011-10-20 DIAGNOSIS — M6281 Muscle weakness (generalized): Secondary | ICD-10-CM

## 2011-10-20 NOTE — Progress Notes (Signed)
Occupational Therapy Treatment  Patient Details  Name: [name] MRN: [id number] Date of Birth: 06/03/1956  Today's Date: 10/20/2011 Time: 2130-8657 Time Calculation (min): 48 min Visit#: 5  of 16   Re-eval: 11/02/11 [facility]  [id number]  22' Therapeutic Exercise  2841-3244  25'    Subjective Symptoms/Limitations Symptoms: S:  My arm is hurting cause I didn't have any medicine last night.  I didn't get it till this morning.  They ran out last night. Pain Assessment Currently in Pain?: Yes Pain Score:   6 Pain Location: Shoulder Pain Orientation: Left Pain Type: Acute pain   Exercise/Treatments Supine Protraction: PROM;10 reps;AROM;12 reps Horizontal ABduction: PROM;10 reps;AROM;12 reps External Rotation: PROM;10 reps;AROM;12 reps Internal Rotation: PROM;10 reps;AROM;12 reps Flexion: PROM;10 reps;AROM;12 reps ABduction: PROM;10 reps;AROM;12 reps Seated Elevation: AROM;15 reps Extension: AROM;15 reps Retraction: AROM;15 reps Row: AROM;15 reps Protraction: AROM;10 reps Horizontal ABduction: AROM;10 reps External Rotation: AROM;10 reps Internal Rotation: AROM;10 reps Flexion: AROM;10 reps Abduction: AROM;10 reps Pulleys Flexion: 3 minutes ABduction: 3 minutes Therapy Ball Flexion: 25 reps ABduction: 25 reps Right/Left: 5 reps ROM / Strengthening / Isometric Strengthening Wall Wash: 2' low wall wash Thumb Tacks: 1' Prot/Ret//Elev/Dep: 1'  Manual Therapy Manual Therapy: Myofascial release Myofascial Release: MFR and manual stretching to left upper arm, scapular, and shoulder region to decrease pain and increase mobility in left shoulder.   Occupational Therapy Assessment and Plan OT Assessment and Plan Clinical Impression Statement: A:  Added AROM with cues to keep shoulder depressed.  D/C dowel ex.  Patient's pain decreased to 4/10 after manuel.  Patient states she continued to feel better with AROM and pulleys. Rehab Potential: Good OT Plan: P:   Increase reps with AROM, add UBE 3 and 3 1.0   Goals Short Term Goals Time to Complete Short Term Goals: 4 weeks Short Term Goal 1: Patient will be educated on HEP. Short Term Goal 2: Patient will increase PROM to [facility] in left shoulder for increased I with combing hair. Short Term Goal 3: Patient will increase left shoulder strength to 3+/5 for increased I with reaching into overhead cabinet in her room. Short Term Goal 4: Patient will decrease pain in left shoulder to 3/10 while combing her hair. Short Term Goal 5: Patient will decrease fasciala restriction to moderate in her left shoulder region. Long Term Goals Time to Complete Long Term Goals: 8 weeks Long Term Goal 1: Patient will return to prior level of independence with all B/IADLs and leisure activities. Long Term Goal 2: Patient will increase AROM to [facility] for increased independence iwth combing and braiding her hair. Long Term Goal 3: Patient will increase left shoulder strength to 4/5 for increased ability to lift clothes into cabinet. Long Term Goal 4: Patient will decrese fascial restrictions to minimal in her left shoulder region. Long Term Goal 5: Patient will decrease pain to 1/10 in  her left shoulder region. End of Session Patient Active Problem List  Diagnoses  . HIV INFECTION  . HEPATITIS C  . IDDM  . DEPRESSION  . OTHER CHRONIC PAIN  . PVD WITH CLAUDICATION  . GERD  . CHRONIC PANCREATITIS  . UNSPECIFIED PRURITIC DISORDER  . THORACIC/LUMBOSACRAL NEURITIS/RADICULITIS UNSPEC  . ALCOHOL ABUSE, HX OF  . COCAINE ABUSE, HX OF  . Claudication of calf muscles  . Chronic pain  . Proximal humerus fracture  . Fracture, humerus, proximal  . Cough  . Pain in joint, shoulder region  . Muscle weakness (generalized)   End of  Session Activity Tolerance: Patient tolerated treatment well General Behavior During Session: [facility] for tasks performed Cognition: [facility] for tasks performed   Lyne Khurana L. Vincen Bejar, COTA/L  10/20/2011,  11:49 AM

## 2011-10-22 ENCOUNTER — Ambulatory Visit (HOSPITAL_COMMUNITY)
Admission: RE | Admit: 2011-10-22 | Discharge: 2011-10-22 | Disposition: A | Discharge status: Home / Self Care | Payer: Medicaid Other | Source: Ambulatory Visit | Attending: Internal Medicine | Admitting: Internal Medicine

## 2011-10-22 DIAGNOSIS — M25519 Pain in unspecified shoulder: Secondary | ICD-10-CM

## 2011-10-22 DIAGNOSIS — M6281 Muscle weakness (generalized): Secondary | ICD-10-CM

## 2011-10-22 NOTE — Progress Notes (Signed)
Occupational Therapy Treatment  Patient Details  Name: [name] MRN: [id number] Date of Birth: 19-Nov-1956  Today's Date: 10/22/2011 Time: 7829-5621 Time Calculation (min): 41 min Manual Therapy [id number] 13' Therapeutic Exercises [phone] 39' Visit#: 6  of 16   Re-eval: 11/02/11    Subjective Symptoms/Limitations Symptoms: S:  I found a big ball that I can exercise with at home. Pain Assessment Currently in Pain?: Yes Pain Score:   3 Pain Location: Shoulder Pain Orientation: Left Pain Type: Acute pain   Exercise/Treatments  10/22/11 0700 Shoulder Exercises: Supine Protraction PROM;10 reps;AROM;12 reps Horizontal ABduction PROM;10 reps;AROM;12 reps External Rotation PROM;10 reps;AROM;12 reps Internal Rotation PROM;10 reps;AROM;12 reps Flexion PROM;10 reps;AROM;12 reps ABduction PROM;10 reps;AROM;12 reps Shoulder Exercises: Seated Elevation AROM;15 reps Extension AROM;15 reps Retraction AROM;15 reps Row AROM;15 reps Protraction AROM;12 reps Horizontal ABduction AROM;12 reps External Rotation AROM;12 reps Internal Rotation AROM;12 reps Flexion AROM;12 reps Abduction AROM;12 reps Shoulder Exercises: Pulleys Flexion (dc) ABduction (dc) Shoulder Exercises: Therapy Ball Flexion 25 reps ABduction 25 reps Right/Left 5 reps Shoulder Exercises: ROM/Strengthening UBE (Upper Arm Bike) 2' and 2' 1.0 Wall Wash 2' low wall wash Thumb Tacks hold today Prot/Ret//Elev/Dep hold today   Manual Therapy Manual Therapy: Myofascial release Myofascial Release: MFR and manual stretchidng to left upper arm, scapular, and shoulder region to decrease pain and increae mobility in left shoulder.  [id number]  Occupational Therapy Assessment and Plan OT Assessment and Plan Clinical Impression Statement: A:  Increased to 12 reps with AROM in supine and seated. OT Plan: P:  Attempt 1 pound in supine.     Goals Short Term Goals Time to Complete Short Term Goals: 4 weeks Short  Term Goal 1: Patient will be educated on HEP. Short Term Goal 1 Progress: Progressing toward goal Short Term Goal 2: Patient will increase PROM to [facility] in left shoulder for increased I with combing hair. Short Term Goal 2 Progress: Progressing toward goal Short Term Goal 3: Patient will increase left shoulder strength to 3+/5 for increased I with reaching into overhead cabinet in her room. Short Term Goal 3 Progress: Progressing toward goal Short Term Goal 4: Patient will decrease pain in left shoulder to 3/10 while combing her hair. Short Term Goal 4 Progress: Progressing toward goal Short Term Goal 5: Patient will decrease fasciala restriction to moderate in her left shoulder region. Short Term Goal 5 Progress: Progressing toward goal Long Term Goals Time to Complete Long Term Goals: 8 weeks Long Term Goal 1: Patient will return to prior level of independence with all B/IADLs and leisure activities. Long Term Goal 1 Progress: Progressing toward goal Long Term Goal 2: Patient will increase AROM to [facility] for increased independence iwth combing and braiding her hair. Long Term Goal 2 Progress: Progressing toward goal Long Term Goal 3: Patient will increase left shoulder strength to 4/5 for increased ability to lift clothes into cabinet. Long Term Goal 3 Progress: Progressing toward goal Long Term Goal 4: Patient will decrese fascial restrictions to minimal in her left shoulder region. Long Term Goal 4 Progress: Progressing toward goal Long Term Goal 5: Patient will decrease pain to 1/10 in  her left shoulder region. Long Term Goal 5 Progress: Progressing toward goal End of Session Patient Active Problem List  Diagnoses  . HIV INFECTION  . HEPATITIS C  . IDDM  . DEPRESSION  . OTHER CHRONIC PAIN  . PVD WITH CLAUDICATION  . GERD  . CHRONIC PANCREATITIS  . UNSPECIFIED PRURITIC DISORDER  . THORACIC/LUMBOSACRAL NEURITIS/RADICULITIS  UNSPEC  . ALCOHOL ABUSE, HX OF  . COCAINE ABUSE, HX OF  .  Claudication of calf muscles  . Chronic pain  . Proximal humerus fracture  . Fracture, humerus, proximal  . Cough  . Pain in joint, shoulder region  . Muscle weakness (generalized)   End of Session Activity Tolerance: Patient tolerated treatment well General Behavior During Session: [facility] for tasks performed Cognition: [facility] for tasks performed   Shirlean Mylar, OTR/L  10/22/2011, 11:41 AM

## 2011-10-27 ENCOUNTER — Ambulatory Visit (HOSPITAL_COMMUNITY)
Admission: RE | Admit: 2011-10-27 | Discharge: 2011-10-27 | Disposition: A | Discharge status: Home / Self Care | Payer: Medicaid Other | Source: Ambulatory Visit | Attending: Internal Medicine | Admitting: Internal Medicine

## 2011-10-27 DIAGNOSIS — M6281 Muscle weakness (generalized): Secondary | ICD-10-CM

## 2011-10-27 DIAGNOSIS — M25519 Pain in unspecified shoulder: Secondary | ICD-10-CM

## 2011-10-27 NOTE — Progress Notes (Signed)
Occupational Therapy Treatment  Patient Details  Name: [name] MRN: [id number] Date of Birth: 06-19-1956  Today's Date: 10/27/2011 Time: 1324-4010 Time Calculation (min): 50 min Visit#: 7  of 16   Re-eval: 11/02/11 [facility] Therapy  [id number] 23' Therapeutic Exercise  [id number] 26'    Subjective Symptoms/Limitations Symptoms: S:  I can do most anything with it.  It just hurts when I move it. Pain Assessment Currently in Pain?: Yes Pain Score:   3 Pain Location: Shoulder Pain Orientation: Left   Exercise/Treatments Supine Protraction: PROM;10 reps;AROM;15 reps Horizontal ABduction: PROM;10 reps;AROM;15 reps External Rotation: PROM;10 reps;AROM;15 reps Internal Rotation: PROM;10 reps;AROM;15 reps Flexion: PROM;10 reps;AROM;15 reps ABduction: PROM;10 reps;AROM;15 reps Seated Elevation: AROM;15 reps Extension: AROM;15 reps Retraction: AROM;15 reps Row: AROM;15 reps Protraction: AROM;15 reps Horizontal ABduction: AROM;15 reps External Rotation: AROM;15 reps Internal Rotation: AROM;15 reps Flexion: AROM;15 reps Abduction: AROM;15 reps    Therapy Ball Flexion: 25 reps ABduction: 25 reps Right/Left: 5 reps ROM / Strengthening / Isometric Strengthening UBE (Upper Arm Bike): 3' and 3' 1.0 Wall Wash: 2' low wall wash Thumb Tacks: 1' Prot/Ret//Elev/Dep: 1'     Manual Therapy Manual Therapy: Myofascial release Myofascial Release: MFR and manual stretching to left upper arm, scapular, and shoulder region to decrease pain and increase mobility in left shoulder.   Occupational Therapy Assessment and Plan OT Assessment and Plan Clinical Impression Statement: A:  Increased UBE to 3' and 3' Rehab Potential: Good OT Plan: P  Attempt 1# in supine and switch to tband for scapular strengthening.   Goals Short Term Goals Time to Complete Short Term Goals: 4 weeks Short Term Goal 1: Patient will be educated on HEP. Short Term Goal 2: Patient will increase PROM to  [facility] in left shoulder for increased I with combing hair. Short Term Goal 3: Patient will increase left shoulder strength to 3+/5 for increased I with reaching into overhead cabinet in her room. Short Term Goal 4: Patient will decrease pain in left shoulder to 3/10 while combing her hair. Short Term Goal 5: Patient will decrease fasciala restriction to moderate in her left shoulder region. Long Term Goals Time to Complete Long Term Goals: 8 weeks Long Term Goal 1: Patient will return to prior level of independence with all B/IADLs and leisure activities. Long Term Goal 2: Patient will increase AROM to [facility] for increased independence iwth combing and braiding her hair. Long Term Goal 3: Patient will increase left shoulder strength to 4/5 for increased ability to lift clothes into cabinet. Long Term Goal 4: Patient will decrese fascial restrictions to minimal in her left shoulder region. Long Term Goal 5: Patient will decrease pain to 1/10 in  her left shoulder region. End of Session Patient Active Problem List  Diagnoses  . HIV INFECTION  . HEPATITIS C  . IDDM  . DEPRESSION  . OTHER CHRONIC PAIN  . PVD WITH CLAUDICATION  . GERD  . CHRONIC PANCREATITIS  . UNSPECIFIED PRURITIC DISORDER  . THORACIC/LUMBOSACRAL NEURITIS/RADICULITIS UNSPEC  . ALCOHOL ABUSE, HX OF  . COCAINE ABUSE, HX OF  . Claudication of calf muscles  . Chronic pain  . Proximal humerus fracture  . Fracture, humerus, proximal  . Cough  . Pain in joint, shoulder region  . Muscle weakness (generalized)   End of Session Activity Tolerance: Patient tolerated treatment well General Behavior During Session: [facility] for tasks performed Cognition: [facility] for tasks performed   Christianne Zacher L. Martisha Toulouse, COTA/L  10/27/2011, 11:12 AM

## 2011-11-03 ENCOUNTER — Ambulatory Visit (HOSPITAL_COMMUNITY)
Admission: RE | Admit: 2011-11-03 | Discharge: 2011-11-03 | Disposition: A | Discharge status: Home / Self Care | Payer: Medicaid Other | Source: Ambulatory Visit | Attending: Internal Medicine | Admitting: Internal Medicine

## 2011-11-03 NOTE — Progress Notes (Signed)
Occupational Therapy Treatment  Patient Details  Name: [name] MRN: [id number] Date of Birth: [birth year]  Today's Date: 11/03/2011 Time: 4098-1191 Time Calculation (min): 53 min Visit#: 8  of 16   Re-eval: 12/01/11 [facility]  [id number]  13' Therapeutic Exercise [id number] 36'    Subjective Symptoms/Limitations Symptoms: S: I can't do my hair yet. Pain Assessment Currently in Pain?: Yes Pain Score:   3 Pain Orientation: Left Pain Type: Acute pain  Exercise/Treatments Supine Protraction: PROM;Strengthening;10 reps Protraction Weight (lbs): 1# Horizontal ABduction: PROM;Strengthening;10 reps Horizontal ABduction Weight (lbs): 1# External Rotation: PROM;Strengthening;10 reps External Rotation Weight (lbs): 1# Internal Rotation: PROM;Strengthening;10 reps Internal Rotation Weight (lbs): 1# Flexion: PROM;Strengthening;10 reps Shoulder Flexion Weight (lbs): 1# ABduction: PROM;Strengthening;10 reps Shoulder ABduction Weight (lbs): 1# Seated Elevation: Theraband;10 reps Theraband Level (Shoulder Elevation): Level 2 (Red) Extension: Theraband;10 reps Theraband Level (Shoulder Extension): Level 2 (Red) Retraction: Theraband;10 reps Theraband Level (Shoulder Retraction): Level 2 (Red) Row: Theraband;10 reps Theraband Level (Shoulder Row): Level 2 (Red) Protraction: AROM;15 reps Horizontal ABduction: AROM;15 reps External Rotation: AROM;15 reps Internal Rotation: AROM;15 reps Flexion: AROM;15 reps Abduction: AROM;15 reps Therapy Ball Flexion: 25 reps ABduction: 25 reps Right/Left: 5 reps ROM / Strengthening / Isometric Strengthening UBE (Upper Arm Bike): 3' and 3' 2.0 Wall Wash: 3' Thumb Tacks: 1' Prot/Ret//Elev/Dep: 1'    Manual Therapy Manual Therapy: Myofascial release Myofascial Release: MFR and manual stretching to left upper arm, scapular, and shoulder region to decrease pain and increase mobility in left shoulder.  Occupational Therapy  Assessment and Plan OT Assessment and Plan Clinical Impression Statement: A:  See progress note.  Added 1# supine and tband. Rehab Potential: Good OT Plan: P: Continue 2x a week for 4 weeks.   Goals Short Term Goals Time to Complete Short Term Goals: 4 weeks Short Term Goal 1: Patient will be educated on HEP. Short Term Goal 1 Progress: Met Short Term Goal 2: Patient will increase PROM to [facility] in left shoulder for increased I with combing hair. Short Term Goal 2 Progress: Met Short Term Goal 3: Patient will increase left shoulder strength to 3+/5 for increased I with reaching into overhead cabinet in her room. Short Term Goal 3 Progress: Met Short Term Goal 4: Patient will decrease pain in left shoulder to 3/10 while combing her hair. Short Term Goal 4 Progress: Met Short Term Goal 5: Patient will decrease fasciala restriction to moderate in her left shoulder region. Short Term Goal 5 Progress: Met Long Term Goals Time to Complete Long Term Goals: 8 weeks Long Term Goal 1: Patient will return to prior level of independence with all B/IADLs and leisure activities. Long Term Goal 1 Progress: Progressing toward goal Long Term Goal 2: Patient will increase AROM to [facility] for increased independence iwth combing and braiding her hair. Long Term Goal 2 Progress: Progressing toward goal Long Term Goal 3: Patient will increase left shoulder strength to 4/5 for increased ability to lift clothes into cabinet. Long Term Goal 3 Progress: Progressing toward goal Long Term Goal 4: Patient will decrese fascial restrictions to minimal in her left shoulder region. Long Term Goal 4 Progress: Progressing toward goal Long Term Goal 5: Patient will decrease pain to 1/10 in  her left shoulder region. Long Term Goal 5 Progress: Progressing toward goal End of Session Patient Active Problem List  Diagnoses  . HIV INFECTION  . HEPATITIS C  . IDDM  . DEPRESSION  . OTHER CHRONIC PAIN  . PVD WITH CLAUDICATION    .  GERD  . CHRONIC PANCREATITIS  . UNSPECIFIED PRURITIC DISORDER  . THORACIC/LUMBOSACRAL NEURITIS/RADICULITIS UNSPEC  . ALCOHOL ABUSE, HX OF  . COCAINE ABUSE, HX OF  . Claudication of calf muscles  . Chronic pain  . Proximal humerus fracture  . Fracture, humerus, proximal  . Cough  . Pain in joint, shoulder region  . Muscle weakness (generalized)   End of Session Activity Tolerance: Patient tolerated treatment well General Behavior During Session: [facility] for tasks performed Cognition: [facility] for tasks performed   Jevaun Strick L. Isom Kochan, COTA/L  11/03/2011, 12:10 PM

## 2011-11-05 ENCOUNTER — Other Ambulatory Visit: Payer: Self-pay | Admitting: Licensed Clinical Social Worker

## 2011-11-05 ENCOUNTER — Ambulatory Visit (HOSPITAL_COMMUNITY)
Admission: RE | Admit: 2011-11-05 | Discharge: 2011-11-05 | Disposition: A | Discharge status: Home / Self Care | Payer: Medicaid Other | Source: Ambulatory Visit | Attending: Internal Medicine | Admitting: Internal Medicine

## 2011-11-05 DIAGNOSIS — M6281 Muscle weakness (generalized): Secondary | ICD-10-CM

## 2011-11-05 DIAGNOSIS — M25519 Pain in unspecified shoulder: Secondary | ICD-10-CM

## 2011-11-05 DIAGNOSIS — G4700 Insomnia, unspecified: Secondary | ICD-10-CM

## 2011-11-05 MED ORDER — TRAZODONE HCL 100 MG PO TABS: 100.0000 mg | ORAL_TABLET | Freq: Every day | ORAL | Status: DC

## 2011-11-05 NOTE — Progress Notes (Signed)
Occupational Therapy Treatment  Patient Details  Name: [name] MRN: [id number] Date of Birth: [birth year]  Today's Date: 11/05/2011 Time: 4098-1191 Time Calculation (min): 41 min Manual Therapy [phone] 16' Therapeutic Exercises [phone] 24' Visit#: 9  of 16   Re-eval: 12/01/11    Subjective Symptoms/Limitations Symptoms: S:  My shoulder is alright, but I am tired. Pain Assessment Currently in Pain?: Yes Pain Score:   2 Pain Location: Shoulder Pain Orientation: Left Pain Type: Acute pain     Exercise/Treatments Supine Protraction: PROM;10 reps;Strengthening;12 reps Protraction Weight (lbs): 1 Horizontal ABduction: PROM;10 reps;Strengthening;12 reps Horizontal ABduction Weight (lbs): 1# External Rotation: PROM;10 reps;Strengthening;12 reps External Rotation Weight (lbs): 1# Internal Rotation: PROM;10 reps;Strengthening;12 reps Internal Rotation Weight (lbs): 1# Flexion: PROM;10 reps;Strengthening;12 reps Shoulder Flexion Weight (lbs): 1# ABduction: PROM;10 reps;Strengthening;12 reps Shoulder ABduction Weight (lbs): 1# Seated Elevation: Strengthening;15 reps (1 pound) Extension: Strengthening;15 reps (1 pound) Retraction: Strengthening;15 reps (1#) Protraction: Strengthening;10 reps Protraction Weight (lbs): 1 Horizontal ABduction: Strengthening;10 reps Horizontal ABduction Weight (lbs): 1 External Rotation: Strengthening;10 reps External Rotation Weight (lbs): 1 Internal Rotation: Strengthening;10 reps Internal Rotation Weight (lbs): 1 Flexion: Strengthening;10 reps Flexion Weight (lbs): 1 Abduction: Strengthening;10 reps ABduction Weight (lbs): 1 Therapy Ball Flexion:  (omit today only, resume next treatment) ABduction:  (omit today only, resume next treatment) Right/Left:  (omit today only, resume next treatment) ROM / Strengthening / Isometric Strengthening UBE (Upper Arm Bike): 3'and 3' 2.5 Cybex Press: 1 plate;10 reps Cybex Row: 1 plate;10  reps Wall Wash: hold today only Thumb Tacks: hold today only "W" Arms: x 10 X to V Arms: x 10 Prot/Ret//Elev/Dep: hold today only   Manual Therapy Manual Therapy: Myofascial release Myofascial Release: MFR and manual stretching to left upper arm, scapular, and shoulder region to decrease pain and increase mobility in left shoulder.  [phone]  Occupational Therapy Assessment and Plan OT Assessment and Plan Clinical Impression Statement: A:  Added weights to seated exercises and added cybex. OT Plan: P:  Complete seated strengthening without facilitation.   Goals Short Term Goals Time to Complete Short Term Goals: 4 weeks Short Term Goal 1: Patient will be educated on HEP. Short Term Goal 2: Patient will increase PROM to [facility] in left shoulder for increased I with combing hair. Short Term Goal 3: Patient will increase left shoulder strength to 3+/5 for increased I with reaching into overhead cabinet in her room. Short Term Goal 4: Patient will decrease pain in left shoulder to 3/10 while combing her hair. Short Term Goal 5: Patient will decrease fasciala restriction to moderate in her left shoulder region. Long Term Goals Time to Complete Long Term Goals: 8 weeks Long Term Goal 1: Patient will return to prior level of independence with all B/IADLs and leisure activities. Long Term Goal 2: Patient will increase AROM to [facility] for increased independence iwth combing and braiding her hair. Long Term Goal 3: Patient will increase left shoulder strength to 4/5 for increased ability to lift clothes into cabinet. Long Term Goal 4: Patient will decrese fascial restrictions to minimal in her left shoulder region. Long Term Goal 5: Patient will decrease pain to 1/10 in  her left shoulder region. End of Session Patient Active Problem List  Diagnoses  . HIV INFECTION  . HEPATITIS C  . IDDM  . DEPRESSION  . OTHER CHRONIC PAIN  . PVD WITH CLAUDICATION  . GERD  . CHRONIC PANCREATITIS  .  UNSPECIFIED PRURITIC DISORDER  . THORACIC/LUMBOSACRAL NEURITIS/RADICULITIS UNSPEC  . ALCOHOL ABUSE,  HX OF  . COCAINE ABUSE, HX OF  . Claudication of calf muscles  . Chronic pain  . Proximal humerus fracture  . Fracture, humerus, proximal  . Cough  . Pain in joint, shoulder region  . Muscle weakness (generalized)   End of Session Activity Tolerance: Patient tolerated treatment well General Behavior During Session: [facility] for tasks performed Cognition: [facility] for tasks performed   Shirlean Mylar, OTR/L  11/05/2011, 10:15 AM

## 2011-11-16 ENCOUNTER — Other Ambulatory Visit: Payer: Self-pay | Admitting: *Deleted

## 2011-11-16 MED ORDER — HYDROCODONE-ACETAMINOPHEN 5-325 MG PO TABS: 1.0000 | ORAL_TABLET | Freq: Four times a day (QID) | ORAL | Status: AC | PRN

## 2011-11-17 ENCOUNTER — Ambulatory Visit (HOSPITAL_COMMUNITY): Payer: Medicaid Other | Admitting: Occupational Therapy

## 2011-11-17 ENCOUNTER — Telehealth (HOSPITAL_COMMUNITY): Payer: Self-pay

## 2011-11-19 ENCOUNTER — Ambulatory Visit (HOSPITAL_COMMUNITY)
Admission: RE | Admit: 2011-11-19 | Discharge: 2011-11-19 | Disposition: A | Discharge status: Home / Self Care | Payer: Medicaid Other | Source: Ambulatory Visit | Attending: Internal Medicine | Admitting: Internal Medicine

## 2011-11-19 DIAGNOSIS — M6281 Muscle weakness (generalized): Secondary | ICD-10-CM

## 2011-11-19 DIAGNOSIS — M25519 Pain in unspecified shoulder: Secondary | ICD-10-CM | POA: Insufficient documentation

## 2011-11-19 DIAGNOSIS — IMO0001 Reserved for inherently not codable concepts without codable children: Secondary | ICD-10-CM | POA: Insufficient documentation

## 2011-11-19 DIAGNOSIS — E119 Type 2 diabetes mellitus without complications: Secondary | ICD-10-CM | POA: Insufficient documentation

## 2011-11-19 DIAGNOSIS — M25629 Stiffness of unspecified elbow, not elsewhere classified: Secondary | ICD-10-CM | POA: Insufficient documentation

## 2011-11-19 NOTE — Progress Notes (Signed)
Occupational Therapy Treatment  Patient Details  Name: [name] MRN: [id number] Date of Birth: 1956/06/26  Today's Date: 11/19/2011 Time: 9147-8295 Time Calculation (min): 53 min Manual Therapy [phone] 16' Therapeutic Exercises [phone] with 5' no charge due to supervised by tech.  31' Visit#: 10  of 16   Re-eval: 11/26/11    Subjective Symptoms/Limitations Symptoms: S:  I have been sick.   Pain Assessment Currently in Pain?: Yes Pain Score:   2 Pain Location: Shoulder Pain Orientation: Left Pain Type: Acute pain   Exercise/Treatments Supine Protraction: PROM;5 reps;Strengthening;10 reps Protraction Weight (lbs): 2 Horizontal ABduction: PROM;5 reps;Strengthening;10 reps Horizontal ABduction Weight (lbs): 2 External Rotation: PROM;5 reps;Strengthening;10 reps External Rotation Weight (lbs): 2 Internal Rotation: PROM;5 reps;Strengthening;10 reps Internal Rotation Weight (lbs): 2 Flexion: PROM;5 reps;Strengthening;10 reps Shoulder Flexion Weight (lbs): 2 ABduction: PROM;5 reps;Strengthening;10 reps Shoulder ABduction Weight (lbs): 2 Seated Protraction: Strengthening;10 reps Protraction Weight (lbs): 1 Horizontal ABduction: Strengthening;10 reps Horizontal ABduction Weight (lbs): 1 External Rotation: Strengthening;10 reps External Rotation Weight (lbs): 1 Internal Rotation: Strengthening;10 reps Internal Rotation Weight (lbs): 1 Flexion: Strengthening;10 reps Flexion Weight (lbs): 1 Abduction: Strengthening;10 reps ABduction Weight (lbs): 1 Therapy Ball Flexion: 15 reps ABduction: 15 reps Right/Left: 5 reps ROM / Strengthening / Isometric Strengthening UBE (Upper Arm Bike): 3' and 3' 3.0 Cybex Press: 1 plate;15 reps Cybex Row: 1 plate;15 reps Wall Wash: 2' with 1# Thumb Tacks: dc "W" Arms: 10 with 1# X to V Arms: 10 with 1# Prot/Ret//Elev/Dep: dc   Manual Therapy Manual Therapy: Myofascial release Myofascial Release: MFR and manual stretching to  left upper arm, scapular, and shoulder region to decrease pain and increase mobility in left shoulder.  [phone]  Occupational Therapy Assessment and Plan OT Assessment and Plan Clinical Impression Statement: A:  Increased to 2# with supine strengthening, added 1# to wall wash.  Able to use weights in seated, but can not go through entire range independently. OT Plan: P:  Increase ability to complete full range of seated strengthening exercises without compensating.   Goals Short Term Goals Time to Complete Short Term Goals: 4 weeks Short Term Goal 1: Patient will be educated on HEP. Short Term Goal 2: Patient will increase PROM to [facility] in left shoulder for increased I with combing hair. Short Term Goal 3: Patient will increase left shoulder strength to 3+/5 for increased I with reaching into overhead cabinet in her room. Short Term Goal 4: Patient will decrease pain in left shoulder to 3/10 while combing her hair. Short Term Goal 5: Patient will decrease fasciala restriction to moderate in her left shoulder region. Long Term Goals Time to Complete Long Term Goals: 8 weeks Long Term Goal 1: Patient will return to prior level of independence with all B/IADLs and leisure activities. Long Term Goal 1 Progress: Progressing toward goal Long Term Goal 2: Patient will increase AROM to [facility] for increased independence iwth combing and braiding her hair. Long Term Goal 2 Progress: Progressing toward goal Long Term Goal 3: Patient will increase left shoulder strength to 4/5 for increased ability to lift clothes into cabinet. Long Term Goal 3 Progress: Progressing toward goal Long Term Goal 4: Patient will decrese fascial restrictions to minimal in her left shoulder region. Long Term Goal 4 Progress: Progressing toward goal Long Term Goal 5: Patient will decrease pain to 1/10 in  her left shoulder region. Long Term Goal 5 Progress: Progressing toward goal End of Session Patient Active Problem List    Diagnoses  . HIV  INFECTION  . HEPATITIS C  . IDDM  . DEPRESSION  . OTHER CHRONIC PAIN  . PVD WITH CLAUDICATION  . GERD  . CHRONIC PANCREATITIS  . UNSPECIFIED PRURITIC DISORDER  . THORACIC/LUMBOSACRAL NEURITIS/RADICULITIS UNSPEC  . ALCOHOL ABUSE, HX OF  . COCAINE ABUSE, HX OF  . Claudication of calf muscles  . Chronic pain  . Proximal humerus fracture  . Fracture, humerus, proximal  . Cough  . Pain in joint, shoulder region  . Muscle weakness (generalized)   End of Session Activity Tolerance: Patient tolerated treatment well General Behavior During Session: [facility] for tasks performed Cognition: [facility] for tasks performed   Shirlean Mylar, OTR/L  11/19/2011, 10:48 AM

## 2011-11-23 ENCOUNTER — Ambulatory Visit: Payer: Medicaid Other | Admitting: Orthopedic Surgery

## 2011-11-24 ENCOUNTER — Ambulatory Visit (HOSPITAL_COMMUNITY): Payer: Medicaid Other | Admitting: Occupational Therapy

## 2011-11-24 ENCOUNTER — Ambulatory Visit: Payer: Medicaid Other | Admitting: Orthopedic Surgery

## 2011-11-24 ENCOUNTER — Other Ambulatory Visit (HOSPITAL_COMMUNITY): Payer: Self-pay | Admitting: Internal Medicine

## 2011-11-24 DIAGNOSIS — Z1231 Encounter for screening mammogram for malignant neoplasm of breast: Secondary | ICD-10-CM

## 2011-11-26 ENCOUNTER — Ambulatory Visit: Payer: Medicaid Other | Admitting: Orthopedic Surgery

## 2011-11-26 ENCOUNTER — Ambulatory Visit (HOSPITAL_COMMUNITY)
Admission: RE | Admit: 2011-11-26 | Discharge: 2011-11-26 | Disposition: A | Discharge status: Home / Self Care | Payer: Medicaid Other | Source: Ambulatory Visit | Attending: Internal Medicine | Admitting: Internal Medicine

## 2011-11-26 NOTE — Progress Notes (Addendum)
Occupational Therapy Treatment  Patient Details  Name: [name] MRN: [id number] Date of Birth: 06-24-1956  Today's Date: 11/26/2011 Time: 4098-1191 Time Calculation (min): 68 min Visit#: 11  of 16   Re-eval: 12/01/11  Manual Therapy [phone] 20' Therapeutic Exercise [phone] 47'  Subjective Symptoms/Limitations Symptoms: S:" I haven't been here for about a week and I'm a little sore" Pain Assessment Currently in Pain?: Yes Pain Score:   2 Pain Location: Shoulder Pain Orientation: Left Pain Type: Acute pain  Precautions/Restrictions     Mobility       Exercise/Treatments Supine Protraction: PROM;5 reps;Strengthening;10 reps Protraction Weight (lbs): 2 Horizontal ABduction: PROM;5 reps;Strengthening;10 reps Horizontal ABduction Weight (lbs): 2 External Rotation: PROM;5 reps;Strengthening;10 reps External Rotation Weight (lbs): 2 Internal Rotation: PROM;5 reps;Strengthening;10 reps Internal Rotation Weight (lbs): 2 Flexion: PROM;5 reps;Strengthening;10 reps Shoulder Flexion Weight (lbs): 2 ABduction: PROM;5 reps;Strengthening;10 reps Shoulder ABduction Weight (lbs): 2 Seated Elevation: Strengthening;15 reps Theraband Level (Shoulder Elevation): Level 2 (Red) Extension: Strengthening;15 reps Theraband Level (Shoulder Extension): Level 2 (Red) Retraction: Strengthening;15 reps Theraband Level (Shoulder Retraction): Level 3 (Green) Row: Theraband;10 reps Theraband Level (Shoulder Row): Level 2 (Red) Protraction: Strengthening;10 reps Protraction Weight (lbs): 1 Horizontal ABduction: Strengthening;10 reps Horizontal ABduction Weight (lbs): 1 External Rotation: Strengthening;10 reps External Rotation Weight (lbs): 1 Internal Rotation: Strengthening;10 reps Internal Rotation Weight (lbs): 1 Flexion: Strengthening;10 reps Flexion Weight (lbs): 1 Abduction: Strengthening;10 reps ABduction Weight (lbs): 1 Therapy Ball Flexion: 15 reps ABduction: 15 reps ROM  / Strengthening / Isometric Strengthening UBE (Upper Arm Bike): 3' and 3' 3.0 Cybex Press: 1.5 plate;15 reps Cybex Row: 1.5 plate;15 reps   Manual Therapy Manual Therapy: Myofascial release Myofascial Release: MFR and manual stretching to LUE scapular and shoulder for decreased pain and increased mobility of left shoulder  Occupational Therapy Assessment and Plan OT Assessment and Plan Clinical Impression Statement: A: Had some difficulty today with 2 # wt. in supine due to weakness. Patient states fatigue at end of session but not pain. Some assist required for proper motor pattern during shoulder elevation and retraction. Rehab Potential: Good OT Frequency: Min 2X/week OT Duration: 8 weeks OT Treatment/Interventions: Therapeutic exercise;Manual therapy;Patient/family education OT Plan: P: Continue to increase Full seated AROM and streengthening with prper motor pattern.   Goals Short Term Goals Time to Complete Short Term Goals: 4 weeks Short Term Goal 1: Patient will be educated on HEP. Short Term Goal 2: Patient will increase PROM to [facility] in left shoulder for increased I with combing hair. Short Term Goal 3: Patient will increase left shoulder strength to 3+/5 for increased I with reaching into overhead cabinet in her room. Short Term Goal 4: Patient will decrease pain in left shoulder to 3/10 while combing her hair. Short Term Goal 5: Patient will decrease fasciala restriction to moderate in her left shoulder region. Long Term Goals Time to Complete Long Term Goals: 8 weeks Long Term Goal 1: Patient will return to prior level of independence with all B/IADLs and leisure activities. Long Term Goal 1 Progress: Progressing toward goal Long Term Goal 2: Patient will increase AROM to [facility] for increased independence iwth combing and braiding her hair. Long Term Goal 2 Progress: Progressing toward goal Long Term Goal 3: Patient will increase left shoulder strength to 4/5 for increased  ability to lift clothes into cabinet. Long Term Goal 3 Progress: Progressing toward goal Long Term Goal 4: Patient will decrese fascial restrictions to minimal in her left shoulder region. Long Term Goal 4 Progress:  Progressing toward goal Long Term Goal 5: Patient will decrease pain to 1/10 in  her left shoulder region. Long Term Goal 5 Progress: Progressing toward goal End of Session Patient Active Problem List  Diagnoses  . HIV INFECTION  . HEPATITIS C  . IDDM  . DEPRESSION  . OTHER CHRONIC PAIN  . PVD WITH CLAUDICATION  . GERD  . CHRONIC PANCREATITIS  . UNSPECIFIED PRURITIC DISORDER  . THORACIC/LUMBOSACRAL NEURITIS/RADICULITIS UNSPEC  . ALCOHOL ABUSE, HX OF  . COCAINE ABUSE, HX OF  . Claudication of calf muscles  . Chronic pain  . Proximal humerus fracture  . Fracture, humerus, proximal  . Cough  . Pain in joint, shoulder region  . Muscle weakness (generalized)   End of Session Activity Tolerance: Patient tolerated treatment well General Behavior During Session: [facility] for tasks performed Cognition: [facility] for tasks performed All treatment completed  By D. Rubye Oaks, OTR/L   11/26/2011, 11:16 AM

## 2011-12-02 ENCOUNTER — Ambulatory Visit (HOSPITAL_COMMUNITY): Payer: Medicaid Other | Admitting: Occupational Therapy

## 2011-12-04 ENCOUNTER — Ambulatory Visit (HOSPITAL_COMMUNITY): Payer: Medicaid Other | Admitting: Specialist

## 2011-12-04 ENCOUNTER — Telehealth (HOSPITAL_COMMUNITY): Payer: Self-pay | Admitting: Specialist

## 2011-12-09 ENCOUNTER — Encounter: Payer: Self-pay | Admitting: Orthopedic Surgery

## 2011-12-09 ENCOUNTER — Other Ambulatory Visit: Payer: Self-pay | Admitting: *Deleted

## 2011-12-09 ENCOUNTER — Ambulatory Visit (INDEPENDENT_AMBULATORY_CARE_PROVIDER_SITE_OTHER): Payer: Medicaid Other | Admitting: Orthopedic Surgery

## 2011-12-09 VITALS — BP 120/70 | Ht 64.0 in | Wt 153.0 lb

## 2011-12-09 DIAGNOSIS — F329 Major depressive disorder, single episode, unspecified: Secondary | ICD-10-CM

## 2011-12-09 DIAGNOSIS — B20 Human immunodeficiency virus [HIV] disease: Secondary | ICD-10-CM

## 2011-12-09 DIAGNOSIS — S42209A Unspecified fracture of upper end of unspecified humerus, initial encounter for closed fracture: Secondary | ICD-10-CM

## 2011-12-09 MED ORDER — MIRTAZAPINE 15 MG PO TABS: 15.0000 mg | ORAL_TABLET | Freq: Every day | ORAL | Status: DC

## 2011-12-09 NOTE — Patient Instructions (Signed)
Therapy continue

## 2011-12-09 NOTE — Telephone Encounter (Signed)
Will refill until pt returns for f/u OV, approx. 01/31/12.

## 2011-12-09 NOTE — Progress Notes (Signed)
Patient ID: [name], female   DOB: Dec 16, 1955, 56 y.o.   MRN: [id number] Visit  LEFT proximal humerus fracture July 18  medications would not allow more than 2 therapy visits patient developed adhesive capsulitis Therapy has improved range of motion to 90 flexion 90 abduction.  Pain control with oral medications 3 times a day  Continue therapy last visit scheduled tomorrow.  Followup one month  Refill meds as needed x1 month

## 2011-12-10 ENCOUNTER — Ambulatory Visit (HOSPITAL_COMMUNITY)
Admission: RE | Admit: 2011-12-10 | Discharge: 2011-12-10 | Disposition: A | Discharge status: Home / Self Care | Payer: Medicaid Other | Source: Ambulatory Visit | Attending: Internal Medicine | Admitting: Internal Medicine

## 2011-12-10 DIAGNOSIS — M25519 Pain in unspecified shoulder: Secondary | ICD-10-CM | POA: Insufficient documentation

## 2011-12-10 DIAGNOSIS — IMO0001 Reserved for inherently not codable concepts without codable children: Secondary | ICD-10-CM | POA: Insufficient documentation

## 2011-12-10 DIAGNOSIS — M6281 Muscle weakness (generalized): Secondary | ICD-10-CM | POA: Insufficient documentation

## 2011-12-10 DIAGNOSIS — M25629 Stiffness of unspecified elbow, not elsewhere classified: Secondary | ICD-10-CM | POA: Insufficient documentation

## 2011-12-10 DIAGNOSIS — E119 Type 2 diabetes mellitus without complications: Secondary | ICD-10-CM | POA: Insufficient documentation

## 2011-12-10 NOTE — Progress Notes (Signed)
Occupational Therapy Treatment  Patient Details  Name: [name] MRN: [id number] Date of Birth: 08/12/1956  Today's Date: 12/10/2011 Time: 4098-1191 Time Calculation (min): 58 min Manual Therapy 401-414 13' Therapeutic Exercises [id number] 40' Visit#: 12  of 24   Re-eval: 01/07/12    Subjective Symptoms/Limitations Symptoms: S:  I went to the MD yesterday, I didnt know I was going, but he wants my measurements. Pain Assessment Currently in Pain?: Yes Pain Score:   2 Pain Location: Shoulder Pain Orientation: Left Pain Type: Acute pain  Precautions/Restrictions     Exercise/Treatments   12/10/11 0700 Shoulder Exercises: Supine Protraction PROM;10 reps;Strengthening;12 reps Protraction Weight (lbs) 2 Horizontal ABduction PROM;10 reps;Strengthening;12 reps Horizontal ABduction Weight (lbs) 2 External Rotation PROM;10 reps;Strengthening;12 reps External Rotation Weight (lbs) 2 Internal Rotation PROM;10 reps;Strengthening;12 reps Internal Rotation Weight (lbs) 2 Flexion PROM;10 reps;Strengthening;12 reps Shoulder Flexion Weight (lbs) 2 ABduction PROM;10 reps;Strengthening;12 reps Shoulder ABduction Weight (lbs) 2 Shoulder Exercises: Seated Extension Theraband;15 reps (green) Retraction Theraband;15 reps (green) Row Theraband;15 reps Protraction Strengthening;10 reps Protraction Weight (lbs) 2 Horizontal ABduction Strengthening;10 reps Horizontal ABduction Weight (lbs) 2 External Rotation Strengthening;10 reps;Theraband;15 reps (green) External Rotation Weight (lbs) 2 Internal Rotation Strengthening;10 reps;Theraband;15 reps (green) Internal Rotation Weight (lbs) 2 Flexion Strengthening;10 reps Flexion Weight (lbs) 2 Abduction Strengthening;10 reps ABduction Weight (lbs) 2 Shoulder Exercises: Therapy Ball Flexion Other (comment) (dc) ABduction Other (comment) (dc) Right/Left Other (comment) (dc) Shoulder Exercises: ROM/Strengthening UBE (Upper Arm  Bike) 3' and 3' 3.0 Cybex Press 1.5 plate;15 reps Cybex Row 1.5 plate;15 reps Wall Wash 3' with 1#      Manual Therapy Manual Therapy: Myofascial release Myofascial Release: MFR and manual stretching to LUE scapular and shoulder region for increased movement and decreased pain.  [id number]  Occupational Therapy Assessment and Plan OT Assessment and Plan Clinical Impression Statement: A:  Please see monthly progress note. OT Plan: P:  Issue Tband as HEP, continue OT 2 x week x 4 weeks.   Goals Short Term Goals Time to Complete Short Term Goals: 4 weeks Short Term Goal 1: Patient will be educated on HEP. Short Term Goal 2: Patient will increase PROM to [facility] in left shoulder for increased I with combing hair. Short Term Goal 3: Patient will increase left shoulder strength to 3+/5 for increased I with reaching into overhead cabinet in her room. Short Term Goal 4: Patient will decrease pain in left shoulder to 3/10 while combing her hair. Short Term Goal 5: Patient will decrease fasciala restriction to moderate in her left shoulder region. Long Term Goals Time to Complete Long Term Goals: 8 weeks Long Term Goal 1: Patient will return to prior level of independence with all B/IADLs and leisure activities. Long Term Goal 1 Progress: Progressing toward goal Long Term Goal 2: Patient will increase AROM to [facility] for increased independence iwth combing and braiding her hair. Long Term Goal 2 Progress: Progressing toward goal Long Term Goal 3: Patient will increase left shoulder strength to 4/5 for increased ability to lift clothes into cabinet. Long Term Goal 3 Progress: Progressing toward goal Long Term Goal 4: Patient will decrese fascial restrictions to minimal in her left shoulder region. Long Term Goal 4 Progress: Met Long Term Goal 5: Patient will decrease pain to 1/10 in  her left shoulder region. Long Term Goal 5 Progress: Progressing toward goal  Problem List Patient Active Problem  List  Diagnoses  . HIV INFECTION  . HEPATITIS C  . IDDM  . DEPRESSION  .  OTHER CHRONIC PAIN  . PVD WITH CLAUDICATION  . GERD  . CHRONIC PANCREATITIS  . UNSPECIFIED PRURITIC DISORDER  . THORACIC/LUMBOSACRAL NEURITIS/RADICULITIS UNSPEC  . ALCOHOL ABUSE, HX OF  . COCAINE ABUSE, HX OF  . Claudication of calf muscles  . Chronic pain  . Proximal humerus fracture  . Fracture, humerus, proximal  . Cough  . Pain in joint, shoulder region  . Muscle weakness (generalized)    End of Session Activity Tolerance: Patient tolerated treatment well General Behavior During Session: [facility] for tasks performed Cognition: [facility] for tasks performed OT Plan of Care OT Home Exercise Plan: shoulder AROM Consulted and Agree with Plan of Care: Patient   [place]qual[place] Code 12/10/2011, 5:00 PM

## 2011-12-15 ENCOUNTER — Ambulatory Visit (HOSPITAL_COMMUNITY)
Admission: RE | Admit: 2011-12-15 | Discharge: 2011-12-15 | Disposition: A | Discharge status: Home / Self Care | Payer: Medicaid Other | Source: Ambulatory Visit | Attending: Internal Medicine | Admitting: Internal Medicine

## 2011-12-15 DIAGNOSIS — M25519 Pain in unspecified shoulder: Secondary | ICD-10-CM

## 2011-12-15 DIAGNOSIS — M6281 Muscle weakness (generalized): Secondary | ICD-10-CM

## 2011-12-15 NOTE — Progress Notes (Signed)
Occupational Therapy Treatment  Patient Details  Name: [name] MRN: [id number] Date of Birth: 1956-04-20  Today's Date: 12/15/2011 Time: 1040-1140 Manual Therapy 1040-1055  15' Therapeutic Exercise 1055-1140 45' Time Calculation (min): 60 min  Visit#: 13  of 24   Re-eval: 01/07/12    Subjective Symptoms/Limitations Symptoms: S:"I am having some pain and a little better than last time." "She gave me some exercisses to do while I was at the place." Repetition: Decreases Symptoms Pain Assessment Currently in Pain?: Yes Pain Score:   1 Pain Location: Shoulder Pain Orientation: Left  Precautions/Restrictions     Exercise/Treatments Supine Protraction: PROM;10 reps;Strengthening;12 reps Protraction Weight (lbs): 2 Horizontal ABduction: PROM;10 reps;Strengthening;12 reps Horizontal ABduction Weight (lbs): 2 External Rotation: PROM;10 reps;Strengthening;12 reps External Rotation Weight (lbs): 2 Internal Rotation: PROM;10 reps;Strengthening;12 reps Internal Rotation Weight (lbs): 2 Flexion: PROM;AROM;12 reps Shoulder Flexion Weight (lbs): 2 ABduction: PROM;10 reps;Strengthening;12 reps Shoulder ABduction Weight (lbs): 2 Seated Elevation: Strengthening;15 reps Theraband Level (Shoulder Elevation): Level 4 (Blue) Extension: Theraband;15 reps Theraband Level (Shoulder Extension): Level 4 (Blue) Retraction: Theraband;15 reps Theraband Level (Shoulder Retraction): Level 4 (Blue) Row: Theraband;15 reps Theraband Level (Shoulder Row): Level 4 (Blue) Protraction: Strengthening;10 reps Protraction Weight (lbs): 2 Horizontal ABduction: Strengthening;10 reps Horizontal ABduction Weight (lbs): 2 External Rotation: Strengthening;10 reps;Theraband;15 reps Theraband Level (Shoulder External Rotation): Level 4 (Blue) External Rotation Weight (lbs): 2 Internal Rotation: Strengthening;10 reps;Theraband;15 reps Theraband Level (Shoulder Internal Rotation): Level 4 (Blue) Internal  Rotation Weight (lbs): 2 Flexion: Strengthening;10 reps Flexion Weight (lbs): 2 Abduction: Strengthening;10 reps ABduction Weight (lbs): 2 ROM / Strengthening / Isometric Strengthening UBE (Upper Arm Bike): 3' and 3' 3.0 Cybex Press: 1.5 plate;15 reps Cybex Row: 1.5 plate;15 reps Wall Wash: 3' with 1# "W" Arms: 10 with 1# X to V Arms: 5 with 1#      Manual Therapy Manual Therapy: Joint mobilization Joint Mobilization: MFR and manual stretching to LUE scapular and shoulder regions to increased movement and decreased pain and restrictions.  Occupational Therapy Assessment and Plan OT Assessment and Plan Clinical Impression Statement: A: Patient progressed to blue theraband and has less pain and increase AROM and PROM than last session and states improvement. Rehab Potential: Good OT Frequency: Min 2X/week OT Duration: 8 weeks OT Treatment/Interventions: Therapeutic exercise;Manual therapy;Patient/family education OT Plan: P: Cont POC OT 2 x week x 4 weeks.   Goals Short Term Goals Time to Complete Short Term Goals: 4 weeks Short Term Goal 1 Progress: Met Short Term Goal 2 Progress: Met Short Term Goal 3 Progress: Met Short Term Goal 4 Progress: Met Short Term Goal 5 Progress: Met Long Term Goals Time to Complete Long Term Goals: 8 weeks Long Term Goal 1 Progress: Progressing toward goal Long Term Goal 2 Progress: Progressing toward goal Long Term Goal 3 Progress: Progressing toward goal Long Term Goal 4 Progress: Met Long Term Goal 5 Progress: Progressing toward goal  Problem List Patient Active Problem List  Diagnoses  . HIV INFECTION  . HEPATITIS C  . IDDM  . DEPRESSION  . OTHER CHRONIC PAIN  . PVD WITH CLAUDICATION  . GERD  . CHRONIC PANCREATITIS  . UNSPECIFIED PRURITIC DISORDER  . THORACIC/LUMBOSACRAL NEURITIS/RADICULITIS UNSPEC  . ALCOHOL ABUSE, HX OF  . COCAINE ABUSE, HX OF  . Claudication of calf muscles  . Chronic pain  . Proximal humerus fracture  .  Fracture, humerus, proximal  . Cough  . Pain in joint, shoulder region  . Muscle weakness (generalized)    End of  Session Activity Tolerance: Patient tolerated treatment well General Behavior During Session: [facility] for tasks performed Cognition: [facility] for tasks performed   Lisa Roca OTR/L 12/15/2011, 11:56 AM

## 2011-12-17 ENCOUNTER — Ambulatory Visit (HOSPITAL_COMMUNITY)
Admission: RE | Admit: 2011-12-17 | Discharge: 2011-12-17 | Disposition: A | Discharge status: Home / Self Care | Payer: Medicaid Other | Source: Ambulatory Visit | Attending: Orthopedic Surgery | Admitting: Orthopedic Surgery

## 2011-12-17 DIAGNOSIS — M25519 Pain in unspecified shoulder: Secondary | ICD-10-CM

## 2011-12-17 DIAGNOSIS — M6281 Muscle weakness (generalized): Secondary | ICD-10-CM

## 2011-12-17 NOTE — Progress Notes (Signed)
Occupational Therapy Treatment  Patient Details  Name: [name] MRN: [id number] Date of Birth: 28-May-1956  Today's Date: 12/17/2011 Time: 4098-1191 Manual Therapy [id number] 20' Therapeutic Exercise 1108-1140 32' Time Calculation (min): 52 min  Visit#: 14  of 24   Re-eval: 01/07/12    Subjective Symptoms/Limitations Symptoms: S: "I think the pain is better than last time 1/10." "I think those 2# weight are just to heavy for me to lift." Repetition: Decreases Symptoms Pain Assessment Currently in Pain?: Yes Pain Score:   1 Pain Location: Shoulder Pain Orientation: Left Pain Type: Acute pain Multiple Pain Sites: No  Precautions/Restrictions     Exercise/Treatments Supine Protraction: PROM;10 reps;Strengthening;12 reps Protraction Weight (lbs): 2 Horizontal ABduction: PROM;10 reps;Strengthening;12 reps Horizontal ABduction Weight (lbs): 2 External Rotation: PROM;10 reps;Strengthening;12 reps External Rotation Weight (lbs): 2 Internal Rotation: PROM;10 reps;Strengthening;12 reps Internal Rotation Weight (lbs): 2 Flexion: PROM;AROM;12 reps Shoulder Flexion Weight (lbs): 2 ABduction: PROM;10 reps;Strengthening;12 reps Shoulder ABduction Weight (lbs): 2 Seated Elevation: Strengthening;15 reps Theraband Level (Shoulder Elevation): Level 4 (Blue) Extension: Theraband;15 reps Theraband Level (Shoulder Extension): Level 4 (Blue) Retraction: Theraband;15 reps Theraband Level (Shoulder Retraction): Level 4 (Blue) Row: Theraband;15 reps Theraband Level (Shoulder Row): Level 4 (Blue) Protraction: Strengthening;10 reps Protraction Weight (lbs): 2 Horizontal ABduction: Strengthening;10 reps Horizontal ABduction Weight (lbs): 2 External Rotation: Strengthening;10 reps;Theraband;15 reps Theraband Level (Shoulder External Rotation): Level 4 (Blue) External Rotation Weight (lbs): 2 Internal Rotation: Strengthening;10 reps;Theraband;15 reps Theraband Level (Shoulder Internal  Rotation): Level 4 (Blue) Internal Rotation Weight (lbs): 2 Flexion: Strengthening;10 reps Flexion Weight (lbs): 2 Abduction: Strengthening;10 reps ABduction Weight (lbs): 2 Prone    Sidelying   Standing   Pulleys   Therapy Ball   ROM / Strengthening / Isometric Strengthening UBE (Upper Arm Bike): 3' and 3' 3.0 Cybex Press: 1.5 plate;15 reps Cybex Row: 1.5 plate;15 reps Wall Wash: 3' with 1# "W" Arms: 10 with 1# X to V Arms:  (10 with 1 #)   Stretches   Power Physiological scientist                 Occupational Therapy Assessment and Plan OT Assessment and Plan Clinical Impression Statement: Pateint given 2 lb wt. wraped on arm in supine position during strengthening with scapular support. Patient decreased to 1# with seated Flex and abduction. Rehab Potential: Good OT Frequency: Min 2X/week OT Duration: 8 weeks OT Treatment/Interventions: Therapeutic exercise;Manual therapy;Patient/family education OT Plan: P: Cont POC OT 2 x week x 4 weeks.   Goals Short Term Goals Short Term Goal 1 Progress: Met Short Term Goal 2 Progress: Met Short Term Goal 3 Progress: Met Short Term Goal 4 Progress: Met Short Term Goal 5 Progress: Met Long Term Goals Long Term Goal 2 Progress: Met Long Term Goal 4 Progress: Met Long Term Goal 5 Progress: Met  Problem List Patient Active Problem List  Diagnoses  . HIV INFECTION  . HEPATITIS C  . IDDM  . DEPRESSION  . OTHER CHRONIC PAIN  . PVD WITH CLAUDICATION  . GERD  . CHRONIC PANCREATITIS  . UNSPECIFIED PRURITIC DISORDER  . THORACIC/LUMBOSACRAL NEURITIS/RADICULITIS UNSPEC  . ALCOHOL ABUSE, HX OF  . COCAINE ABUSE, HX OF  . Claudication of calf muscles  . Chronic pain  . Proximal humerus fracture  . Fracture, humerus, proximal  . Cough  . Pain in joint, shoulder region  . Muscle weakness (generalized)    End of Session Activity Tolerance: Patient tolerated treatment well General Behavior During Session: [facility] for  tasks performed Cognition: [facility] for tasks performed   Lisa Roca OTR/L 12/17/2011, 11:40 AM

## 2011-12-22 ENCOUNTER — Ambulatory Visit (HOSPITAL_COMMUNITY)
Admission: RE | Admit: 2011-12-22 | Discharge: 2011-12-22 | Disposition: A | Discharge status: Home / Self Care | Payer: Medicaid Other | Source: Ambulatory Visit | Attending: Internal Medicine | Admitting: Internal Medicine

## 2011-12-22 DIAGNOSIS — M25519 Pain in unspecified shoulder: Secondary | ICD-10-CM

## 2011-12-22 DIAGNOSIS — M6281 Muscle weakness (generalized): Secondary | ICD-10-CM

## 2011-12-22 NOTE — Progress Notes (Signed)
Occupational Therapy Treatment  Patient Details  Name: Jasmine Jasmine MRN: [id number] Date of Birth: 1956/01/04  Today's Date: 12/22/2011 Time: 1111-1200 Manual therapy [id number] 10' Therapeutic Exercise [id number]  24' Re-Evaluation 1145-1200 15' Time Calculation (min): 49 min  Visit#: 15  of 24   Re-eval: 12/22/11    Subjective Symptoms/Limitations Symptoms: S: "I been feeling alright just a little bit of pain at the end" End of shoulder flex in supine. Repetition: Decreases Symptoms Pain Assessment Currently in Pain?: No/denies Pain Score: 0-No pain Pain Location: Shoulder Pain Orientation: Left     Exercise/Treatments Supine Protraction: PROM;Strengthening;15 reps Protraction Weight (lbs): 2 Horizontal ABduction: PROM;Strengthening;15 reps Horizontal ABduction Weight (lbs): 2 External Rotation: PROM;10 reps;Strengthening;15 reps External Rotation Weight (lbs): 2 Internal Rotation: PROM;10 reps;Strengthening;15 reps Internal Rotation Weight (lbs): 2 Flexion: PROM;Strengthening;15 reps Shoulder Flexion Weight (lbs): 2 ABduction: PROM;10 reps;Strengthening;15 reps Shoulder ABduction Weight (lbs): 2 Seated Extension: Strengthening;15 reps Theraband Level (Shoulder Extension): Level 4 (Blue) Retraction: Strengthening;Theraband;15 reps Theraband Level (Shoulder Retraction): Level 4 (Blue) Row: Strengthening;Theraband;15 reps Theraband Level (Shoulder Row): Level 4 (Blue) Protraction: Strengthening;15 reps;Theraband Theraband Level (Shoulder Protraction): Level 4 (Blue) External Rotation: Strengthening;10 reps;Theraband;15 reps Theraband Level (Shoulder External Rotation): Level 2 (Red) Internal Rotation: Strengthening;Theraband;15 reps Theraband Level (Shoulder Internal Rotation): Level 4 (Blue) Other Seated Exercises:  (Cane Exercise AAROM ABD/Flex with slow lowering off/down)    ROM / Strengthening / Isometric Strengthening UBE (Upper Arm Bike): 3' and 3'  3.0 Cybex Press: 15 reps;2 plate Cybex Row: 2 plate;15 reps Wall Wash: 3' with 1#      Manual Therapy Manual Therapy: Joint mobilization Myofascial Release: Joint mobilization to Left shoulder.  Occupational Therapy Assessment and Plan OT Assessment and Plan Clinical Impression Statement: A: Patient has no pain today and full return of function meeting all goals. Patient needs only 20 degrees more ROM Flex /ABD of skhoulder. Rehab Potential: Excellent OT Duration: Other (comment) (Discharge today. Function returned 0 pain) OT Treatment/Interventions: Therapeutic exercise;Self-care/ADL training;Patient/family education;Manual therapy   Goals [name] Exercise Program Pt will Perform [name] Exercise Program: Independently PT Goal: Perform [name] Exercise Program - Progress: Met Short Term Goals Short Term Goal 1 Progress: Met Short Term Goal 2 Progress: Met Short Term Goal 3 Progress: Met Short Term Goal 4 Progress: Met Short Term Goal 5 Progress: Met Long Term Goals Long Term Goal 1 Progress: Met Long Term Goal 2 Progress: Met Long Term Goal 3 Progress: Met Long Term Goal 4 Progress: Met Long Term Goal 5 Progress: Met  Problem List Patient Active Problem List  Diagnoses  . HIV INFECTION  . HEPATITIS C  . IDDM  . DEPRESSION  . OTHER CHRONIC PAIN  . PVD WITH CLAUDICATION  . GERD  . CHRONIC PANCREATITIS  . UNSPECIFIED PRURITIC DISORDER  . THORACIC/LUMBOSACRAL NEURITIS/RADICULITIS UNSPEC  . ALCOHOL ABUSE, HX OF  . COCAINE ABUSE, HX OF  . Claudication of calf muscles  . Chronic pain  . Proximal humerus fracture  . Fracture, humerus, proximal  . Cough  . Pain in joint, shoulder region  . Muscle weakness (generalized)   Re Evaluatino:  Flex 130 AROM 140 PROM; ABD 110 AROM 130 PROM, 80 ER; 55 IR 42 Lbs Left Grip.  End of Session Activity Tolerance: Patient tolerated treatment well General Behavior During Session: Jasmine Jasmine for tasks performed Cognition: [facility] for tasks  performed OT Plan of Care OT [name] Exercise Plan: Wall slides with slow lowering down for Self ROM; increased reps. Pateint met all goals and is  now  ready for Dc with no main on movement.   Lisa Roca OTR/L 12/22/2011, 12:11 PM

## 2011-12-24 ENCOUNTER — Ambulatory Visit (HOSPITAL_COMMUNITY): Payer: Medicaid Other | Admitting: Occupational Therapy

## 2011-12-28 ENCOUNTER — Other Ambulatory Visit: Payer: Self-pay | Admitting: *Deleted

## 2011-12-28 DIAGNOSIS — S42209A Unspecified fracture of upper end of unspecified humerus, initial encounter for closed fracture: Secondary | ICD-10-CM

## 2011-12-28 MED ORDER — HYDROCODONE-ACETAMINOPHEN 5-325 MG PO TABS: 1.0000 | ORAL_TABLET | Freq: Four times a day (QID) | ORAL | Status: AC | PRN

## 2011-12-29 ENCOUNTER — Ambulatory Visit (HOSPITAL_COMMUNITY)
Admission: RE | Admit: 2011-12-29 | Discharge: 2011-12-29 | Disposition: A | Discharge status: Home / Self Care | Payer: Medicaid Other | Source: Ambulatory Visit | Attending: Internal Medicine | Admitting: Internal Medicine

## 2011-12-29 ENCOUNTER — Ambulatory Visit (HOSPITAL_COMMUNITY): Payer: Medicaid Other | Admitting: Occupational Therapy

## 2011-12-29 DIAGNOSIS — Z1231 Encounter for screening mammogram for malignant neoplasm of breast: Secondary | ICD-10-CM | POA: Insufficient documentation

## 2011-12-31 ENCOUNTER — Other Ambulatory Visit: Payer: Self-pay | Admitting: *Deleted

## 2011-12-31 ENCOUNTER — Ambulatory Visit (HOSPITAL_COMMUNITY): Payer: Medicaid Other | Admitting: Occupational Therapy

## 2011-12-31 DIAGNOSIS — M545 Low back pain: Secondary | ICD-10-CM

## 2011-12-31 DIAGNOSIS — J302 Other seasonal allergic rhinitis: Secondary | ICD-10-CM

## 2011-12-31 MED ORDER — DOCUSATE SODIUM 100 MG PO CAPS: 100.0000 mg | ORAL_CAPSULE | Freq: Every day | ORAL | Status: DC

## 2011-12-31 MED ORDER — PREGABALIN 100 MG PO CAPS: 100.0000 mg | ORAL_CAPSULE | Freq: Three times a day (TID) | ORAL | Status: DC

## 2011-12-31 MED ORDER — CETIRIZINE HCL 10 MG PO TABS: 10.0000 mg | ORAL_TABLET | Freq: Every day | ORAL | Status: DC

## 2012-01-05 ENCOUNTER — Ambulatory Visit (HOSPITAL_COMMUNITY): Payer: Medicaid Other | Admitting: Occupational Therapy

## 2012-01-06 ENCOUNTER — Other Ambulatory Visit: Payer: Self-pay | Admitting: *Deleted

## 2012-01-06 DIAGNOSIS — J302 Other seasonal allergic rhinitis: Secondary | ICD-10-CM

## 2012-01-06 NOTE — Assessment & Plan Note (Signed)
Clinically stable on current regimen. Continue present management.   

## 2012-01-06 NOTE — Assessment & Plan Note (Signed)
Will review treatment options after January, 2013.

## 2012-01-06 NOTE — Progress Notes (Signed)
Subjective:    Patient ID: [name] is a 56 y.o. female.  Chief Complaint: HIV Follow-up Visit Tyquisha Sharps Ploeger is here for follow-up of HIV infection. She is feeling unchanged since her last visit.  He claims continued adherence to therapy with good tolerance and no complications. There are not additional complaints. Since last visit, she had a fracture of her right arm for which she is followed by Orthopedics/  Still has chronic pain but is managed thru her Ortho.  Data Review: Diagnostic studies reviewed.  Review of Systems - General ROS: negative for - fatigue, fever, malaise or sleep disturbance Psychological ROS: negative for - anxiety, behavioral disorder, concentration difficulties, depression or mood swings Endocrine ROS: negative for - polydipsia/polyuria Respiratory ROS: no cough, shortness of breath, or wheezing Cardiovascular ROS: no chest pain or dyspnea on exertion Musculoskeletal ROS: positive for - pain in arm - right Neurological ROS: no TIA or stroke symptoms  Objective:  General appearance: alert, cooperative, appears stated age and no distress Head: atraumatic Neck: no adenopathy, no carotid bruit, no JVD, supple, symmetrical, trachea midline and thyroid not enlarged, symmetric, no tenderness/mass/nodules Resp: clear to auscultation bilaterally Cardio: regular rate and rhythm, S1, S2 normal, no murmur, click, rub or gallop Extremities: right arm is in a sling. Neurologic: Grossly normal Skin:  No active lesions or rashes noted.     Psych:  No vegetative signs or delusional behaviors noted.     Laboratory: From 08/11/11 ,  CD4 count was 700 c/cmm @ 30%. Viral load <20 copies/ml.    Assessment/Plan:   HIV INFECTION Clinically stable on current regimen. Continue present management.   HEPATITIS C Will review treatment options after January, 2013.    IDDM Continue followup with [facility]     [facility] A. Sundra Aland, MS, [facility] for Infectious Disease [phone]  01/06/2012, 11:44 AM

## 2012-01-06 NOTE — Assessment & Plan Note (Signed)
Continue followup with [facility]in[facility]

## 2012-01-07 ENCOUNTER — Ambulatory Visit (HOSPITAL_COMMUNITY): Payer: Medicaid Other | Admitting: Occupational Therapy

## 2012-01-12 ENCOUNTER — Ambulatory Visit (INDEPENDENT_AMBULATORY_CARE_PROVIDER_SITE_OTHER): Payer: Medicaid Other | Admitting: Orthopedic Surgery

## 2012-01-12 ENCOUNTER — Encounter: Payer: Self-pay | Admitting: Orthopedic Surgery

## 2012-01-12 VITALS — BP 104/72 | Ht 64.0 in | Wt 151.0 lb

## 2012-01-12 DIAGNOSIS — M25519 Pain in unspecified shoulder: Secondary | ICD-10-CM

## 2012-01-12 MED ORDER — HYDROCODONE-ACETAMINOPHEN 5-325 MG PO TABS: 1.0000 | ORAL_TABLET | Freq: Four times a day (QID) | ORAL | Status: DC | PRN

## 2012-01-12 NOTE — Progress Notes (Signed)
Patient ID: [name], female   DOB: 1956-02-07, 56 y.o.   MRN: [id number]  F/U   Proximal humerus fracture, LEFT shoulder.  Therapy has been completed.  Patient still has limitations in motion and functional activities of daily living. She can't tie her shoes. She can't do her hair.  This is a direct result of the limitations that were placed on her initial physical therapy. I believe, she would've had therapy is ordered, she would've been fineness most all patients do with his injury. This was a nondisplaced fracture should have done well. However, Medicaid, limited her initial therapeutic visits, and she developed chronic shoulder pain and limitations of strength and function.  ROS: NO NECK PAIN  Today, she can abduct to 90, forward elevate 90, and this was painful. She had good external rotation, but weakness of her rotator cuff. Her neurovascular exam was intact. She had tenderness around the  No lesion.  No swelling, was detected.  Impression proximal humerus fracture. Impression rotator cuff dysfunction.  Patient is discharged with medication for 3 months and then over-the-counter medications. Surgeries not helpful in this situation.

## 2012-01-12 NOTE — Patient Instructions (Signed)
Your treatmentis complete, you will have some pain in your shoulder  You have a prescription for pain medication for 3 months after that take tylenol or over the counter medication

## 2012-01-26 ENCOUNTER — Other Ambulatory Visit: Payer: Self-pay | Admitting: *Deleted

## 2012-01-26 DIAGNOSIS — E119 Type 2 diabetes mellitus without complications: Secondary | ICD-10-CM

## 2012-01-26 MED ORDER — INSULIN PEN NEEDLE 31G X 8 MM MISC: Status: DC

## 2012-02-01 ENCOUNTER — Other Ambulatory Visit: Payer: Self-pay | Admitting: *Deleted

## 2012-02-01 DIAGNOSIS — R52 Pain, unspecified: Secondary | ICD-10-CM

## 2012-02-01 MED ORDER — MELOXICAM 7.5 MG PO TABS: 7.5000 mg | ORAL_TABLET | Freq: Every day | ORAL | Status: DC

## 2012-02-02 ENCOUNTER — Other Ambulatory Visit: Payer: Self-pay | Admitting: Infectious Diseases

## 2012-02-02 ENCOUNTER — Other Ambulatory Visit: Payer: Self-pay | Admitting: *Deleted

## 2012-02-02 DIAGNOSIS — K861 Other chronic pancreatitis: Secondary | ICD-10-CM

## 2012-02-02 DIAGNOSIS — Z0000 Encounter for general adult medical examination without abnormal findings: Secondary | ICD-10-CM

## 2012-02-02 DIAGNOSIS — K219 Gastro-esophageal reflux disease without esophagitis: Secondary | ICD-10-CM

## 2012-02-02 DIAGNOSIS — R52 Pain, unspecified: Secondary | ICD-10-CM

## 2012-02-02 MED ORDER — ASPIRIN 81 MG PO TBEC: 81.0000 mg | DELAYED_RELEASE_TABLET | Freq: Every day | ORAL | Status: DC

## 2012-02-02 MED ORDER — PANCRELIPASE (LIP-PROT-AMYL) 6000-19000 UNITS PO CPEP: 2.0000 | ORAL_CAPSULE | Freq: Three times a day (TID) | ORAL | Status: DC

## 2012-02-02 MED ORDER — OMEPRAZOLE 20 MG PO CPDR: 20.0000 mg | DELAYED_RELEASE_CAPSULE | Freq: Every day | ORAL | Status: DC

## 2012-02-03 ENCOUNTER — Encounter: Payer: Self-pay | Admitting: Adult Health

## 2012-02-25 ENCOUNTER — Other Ambulatory Visit: Payer: Self-pay | Admitting: *Deleted

## 2012-02-25 DIAGNOSIS — K861 Other chronic pancreatitis: Secondary | ICD-10-CM

## 2012-02-25 DIAGNOSIS — K219 Gastro-esophageal reflux disease without esophagitis: Secondary | ICD-10-CM

## 2012-02-25 DIAGNOSIS — Z0000 Encounter for general adult medical examination without abnormal findings: Secondary | ICD-10-CM

## 2012-02-25 DIAGNOSIS — M545 Low back pain: Secondary | ICD-10-CM

## 2012-02-25 MED ORDER — OMEPRAZOLE 20 MG PO CPDR: 20.0000 mg | DELAYED_RELEASE_CAPSULE | Freq: Every day | ORAL | Status: DC

## 2012-02-25 MED ORDER — ASPIRIN 81 MG PO TBEC: 81.0000 mg | DELAYED_RELEASE_TABLET | Freq: Every day | ORAL | Status: DC

## 2012-02-25 MED ORDER — PREGABALIN 100 MG PO CAPS: 100.0000 mg | ORAL_CAPSULE | Freq: Three times a day (TID) | ORAL | Status: DC

## 2012-02-26 ENCOUNTER — Other Ambulatory Visit: Payer: Self-pay | Admitting: *Deleted

## 2012-02-26 ENCOUNTER — Other Ambulatory Visit: Payer: Self-pay | Admitting: Infectious Diseases

## 2012-02-26 DIAGNOSIS — G4700 Insomnia, unspecified: Secondary | ICD-10-CM

## 2012-02-26 DIAGNOSIS — M792 Neuralgia and neuritis, unspecified: Secondary | ICD-10-CM

## 2012-02-26 DIAGNOSIS — K861 Other chronic pancreatitis: Secondary | ICD-10-CM

## 2012-02-26 MED ORDER — TRAZODONE HCL 100 MG PO TABS: 100.0000 mg | ORAL_TABLET | Freq: Every day | ORAL | Status: DC

## 2012-03-01 ENCOUNTER — Other Ambulatory Visit: Payer: Self-pay | Admitting: *Deleted

## 2012-03-01 DIAGNOSIS — Z113 Encounter for screening for infections with a predominantly sexual mode of transmission: Secondary | ICD-10-CM

## 2012-03-01 DIAGNOSIS — Z79899 Other long term (current) drug therapy: Secondary | ICD-10-CM

## 2012-03-29 ENCOUNTER — Other Ambulatory Visit: Payer: Self-pay | Admitting: Infectious Disease

## 2012-04-06 ENCOUNTER — Other Ambulatory Visit: Payer: Medicaid Other

## 2012-04-07 ENCOUNTER — Other Ambulatory Visit: Payer: Self-pay | Admitting: Internal Medicine

## 2012-04-07 DIAGNOSIS — R140 Abdominal distension (gaseous): Secondary | ICD-10-CM

## 2012-04-13 ENCOUNTER — Other Ambulatory Visit: Payer: Medicaid Other

## 2012-04-13 ENCOUNTER — Other Ambulatory Visit (HOSPITAL_COMMUNITY)
Admission: RE | Admit: 2012-04-13 | Discharge: 2012-04-13 | Disposition: A | Discharge status: Home / Self Care | Payer: Medicaid Other | Source: Ambulatory Visit | Attending: Infectious Disease | Admitting: Infectious Disease

## 2012-04-13 DIAGNOSIS — Z113 Encounter for screening for infections with a predominantly sexual mode of transmission: Secondary | ICD-10-CM

## 2012-04-13 DIAGNOSIS — B20 Human immunodeficiency virus [HIV] disease: Secondary | ICD-10-CM

## 2012-04-13 DIAGNOSIS — Z79899 Other long term (current) drug therapy: Secondary | ICD-10-CM

## 2012-04-13 LAB — CBC WITH DIFFERENTIAL/PLATELET
Basophils Relative: 1 % (ref 0–1)
Eosinophils Absolute: 0.2 10*3/uL (ref 0.0–0.7)
Eosinophils Relative: 4 % (ref 0–5)
MCH: 29.3 pg (ref 26.0–34.0)
MCHC: 31.8 g/dL (ref 30.0–36.0)
Neutrophils Relative %: 51 % (ref 43–77)
Platelets: 220 10*3/uL (ref 150–400)

## 2012-04-13 LAB — COMPLETE METABOLIC PANEL WITH GFR
AST: 37 U/L (ref 0–37)
Alkaline Phosphatase: 115 U/L (ref 39–117)
BUN: 19 mg/dL (ref 6–23)
Creat: 1.03 mg/dL (ref 0.50–1.10)
Glucose, Bld: 177 mg/dL — ABNORMAL HIGH (ref 70–99)
Total Bilirubin: 0.3 mg/dL (ref 0.3–1.2)

## 2012-04-13 LAB — RPR: RPR Ser Ql: REACTIVE — AB

## 2012-04-13 LAB — LIPID PANEL
LDL Cholesterol: 68 mg/dL (ref 0–99)
Total CHOL/HDL Ratio: 2.9 Ratio

## 2012-04-14 ENCOUNTER — Ambulatory Visit
Admission: RE | Admit: 2012-04-14 | Discharge: 2012-04-14 | Disposition: A | Discharge status: Home / Self Care | Payer: Medicaid Other | Source: Ambulatory Visit | Attending: Internal Medicine | Admitting: Internal Medicine

## 2012-04-14 ENCOUNTER — Telehealth: Payer: Self-pay | Admitting: Licensed Clinical Social Worker

## 2012-04-14 DIAGNOSIS — R140 Abdominal distension (gaseous): Secondary | ICD-10-CM

## 2012-04-14 LAB — T.PALLIDUM AB, TOTAL: T pallidum Antibodies (TP-PA): >8 S/CO — ABNORMAL HIGH (ref <0.90)

## 2012-04-14 LAB — T-HELPER CELL (CD4) - (RCID CLINIC ONLY)
CD4 % Helper T Cell: 31 % — ABNORMAL LOW (ref 33–55)
CD4 T Cell Abs: 650 uL (ref 400–2700)

## 2012-04-14 LAB — HIV-1 RNA QUANT-NO REFLEX-BLD: HIV 1 RNA Quant: <20 copies/mL (ref <20)

## 2012-04-14 NOTE — Telephone Encounter (Signed)
Patient is leaving forms for Dr. Daiva Eves to fill out for her to get pull ups. I will put them in his box to sign when he returns.

## 2012-04-20 ENCOUNTER — Ambulatory Visit: Payer: Medicaid Other | Admitting: Infectious Disease

## 2012-04-23 ENCOUNTER — Emergency Department (HOSPITAL_COMMUNITY): Payer: Medicaid Other

## 2012-04-23 ENCOUNTER — Emergency Department (HOSPITAL_COMMUNITY)
Admission: EM | Admit: 2012-04-23 | Discharge: 2012-04-23 | Disposition: A | Discharge status: Skilled Nursing Facility | Payer: Medicaid Other | Attending: Emergency Medicine | Admitting: Emergency Medicine

## 2012-04-23 ENCOUNTER — Encounter (HOSPITAL_COMMUNITY): Payer: Self-pay | Admitting: Emergency Medicine

## 2012-04-23 DIAGNOSIS — R142 Eructation: Secondary | ICD-10-CM | POA: Insufficient documentation

## 2012-04-23 DIAGNOSIS — K861 Other chronic pancreatitis: Secondary | ICD-10-CM | POA: Insufficient documentation

## 2012-04-23 DIAGNOSIS — F3289 Other specified depressive episodes: Secondary | ICD-10-CM | POA: Insufficient documentation

## 2012-04-23 DIAGNOSIS — F329 Major depressive disorder, single episode, unspecified: Secondary | ICD-10-CM | POA: Insufficient documentation

## 2012-04-23 DIAGNOSIS — N390 Urinary tract infection, site not specified: Secondary | ICD-10-CM | POA: Insufficient documentation

## 2012-04-23 DIAGNOSIS — Z7982 Long term (current) use of aspirin: Secondary | ICD-10-CM | POA: Insufficient documentation

## 2012-04-23 DIAGNOSIS — E119 Type 2 diabetes mellitus without complications: Secondary | ICD-10-CM | POA: Insufficient documentation

## 2012-04-23 DIAGNOSIS — R141 Gas pain: Secondary | ICD-10-CM | POA: Insufficient documentation

## 2012-04-23 DIAGNOSIS — R1084 Generalized abdominal pain: Secondary | ICD-10-CM | POA: Insufficient documentation

## 2012-04-23 DIAGNOSIS — R143 Flatulence: Secondary | ICD-10-CM | POA: Insufficient documentation

## 2012-04-23 DIAGNOSIS — Z8673 Personal history of transient ischemic attack (TIA), and cerebral infarction without residual deficits: Secondary | ICD-10-CM | POA: Insufficient documentation

## 2012-04-23 DIAGNOSIS — Z794 Long term (current) use of insulin: Secondary | ICD-10-CM | POA: Insufficient documentation

## 2012-04-23 DIAGNOSIS — A599 Trichomoniasis, unspecified: Secondary | ICD-10-CM | POA: Insufficient documentation

## 2012-04-23 DIAGNOSIS — G8929 Other chronic pain: Secondary | ICD-10-CM | POA: Insufficient documentation

## 2012-04-23 DIAGNOSIS — Z8619 Personal history of other infectious and parasitic diseases: Secondary | ICD-10-CM | POA: Insufficient documentation

## 2012-04-23 DIAGNOSIS — Z79899 Other long term (current) drug therapy: Secondary | ICD-10-CM | POA: Insufficient documentation

## 2012-04-23 DIAGNOSIS — K219 Gastro-esophageal reflux disease without esophagitis: Secondary | ICD-10-CM | POA: Insufficient documentation

## 2012-04-23 DIAGNOSIS — Z21 Asymptomatic human immunodeficiency virus [HIV] infection status: Secondary | ICD-10-CM | POA: Insufficient documentation

## 2012-04-23 HISTORY — DX: Human immunodeficiency virus (HIV) disease: B20

## 2012-04-23 HISTORY — DX: Other psychoactive substance abuse, uncomplicated: F19.10

## 2012-04-23 HISTORY — DX: Gastro-esophageal reflux disease without esophagitis: K21.9

## 2012-04-23 LAB — CBC
HCT: 37.9 % (ref 36.0–46.0)
MCH: 29.5 pg (ref 26.0–34.0)
MCHC: 32.2 g/dL (ref 30.0–36.0)
MCV: 91.8 fL (ref 78.0–100.0)
Platelets: 236 10*3/uL (ref 150–400)
RDW: 14.1 % (ref 11.5–15.5)

## 2012-04-23 LAB — COMPREHENSIVE METABOLIC PANEL
ALT: 35 U/L (ref 0–35)
AST: 34 U/L (ref 0–37)
CO2: 23 mEq/L (ref 19–32)
Calcium: 9.9 mg/dL (ref 8.4–10.5)
Creatinine, Ser: 1.02 mg/dL (ref 0.50–1.10)
GFR calc Af Amer: 70 mL/min — ABNORMAL LOW (ref >90)
GFR calc non Af Amer: 60 mL/min — ABNORMAL LOW (ref >90)
Sodium: 137 mEq/L (ref 135–145)
Total Protein: 7.7 g/dL (ref 6.0–8.3)

## 2012-04-23 LAB — URINALYSIS, ROUTINE W REFLEX MICROSCOPIC
Bilirubin Urine: NEGATIVE
Ketones, ur: NEGATIVE mg/dL
Leukocytes, UA: NEGATIVE
Nitrite: NEGATIVE
Specific Gravity, Urine: 1.02 (ref 1.005–1.030)
Urobilinogen, UA: 0.2 mg/dL (ref 0.0–1.0)

## 2012-04-23 LAB — LACTIC ACID, PLASMA: Lactic Acid, Venous: 1.7 mmol/L (ref 0.5–2.2)

## 2012-04-23 LAB — URINE MICROSCOPIC-ADD ON

## 2012-04-23 LAB — DIFFERENTIAL
Basophils Absolute: 0.1 10*3/uL (ref 0.0–0.1)
Eosinophils Absolute: 0.2 10*3/uL (ref 0.0–0.7)
Eosinophils Relative: 3 % (ref 0–5)
Lymphocytes Relative: 35 % (ref 12–46)
Monocytes Absolute: 0.5 10*3/uL (ref 0.1–1.0)

## 2012-04-23 LAB — PROCALCITONIN: Procalcitonin: <0.1 ng/mL

## 2012-04-23 MED ORDER — IOHEXOL 300 MG/ML  SOLN
100.0000 mL | Freq: Once | INTRAMUSCULAR | Status: AC | PRN
  Administered 2012-04-23: 100 mL via INTRAVENOUS

## 2012-04-23 MED ORDER — MORPHINE SULFATE 4 MG/ML IJ SOLN
4.0000 mg | INTRAMUSCULAR | Status: DC | PRN
  Administered 2012-04-23: 4 mg via INTRAVENOUS
  Filled 2012-04-23: qty 1

## 2012-04-23 MED ORDER — HYDROCODONE-ACETAMINOPHEN 5-325 MG PO TABS: ORAL_TABLET | ORAL | Status: AC

## 2012-04-23 MED ORDER — IOHEXOL 300 MG/ML  SOLN
40.0000 mL | Freq: Once | INTRAMUSCULAR | Status: AC | PRN
  Administered 2012-04-23: 40 mL via ORAL

## 2012-04-23 MED ORDER — METRONIDAZOLE 500 MG PO TABS
2000.0000 mg | ORAL_TABLET | Freq: Once | ORAL | Status: AC
  Administered 2012-04-23: 2000 mg via ORAL
  Filled 2012-04-23: qty 4

## 2012-04-23 MED ORDER — SODIUM CHLORIDE 0.9 % IV SOLN
INTRAVENOUS | Status: DC
  Administered 2012-04-23: 1000 mL via INTRAVENOUS

## 2012-04-23 MED ORDER — DEXTROSE 5 % IV SOLN
1.0000 g | Freq: Once | INTRAVENOUS | Status: AC
  Administered 2012-04-23: 1 g via INTRAVENOUS
  Filled 2012-04-23: qty 10

## 2012-04-23 MED ORDER — CEPHALEXIN 500 MG PO CAPS: 500.0000 mg | ORAL_CAPSULE | Freq: Four times a day (QID) | ORAL | Status: AC

## 2012-04-23 MED ORDER — ONDANSETRON HCL 4 MG/2ML IJ SOLN
4.0000 mg | INTRAMUSCULAR | Status: DC | PRN
  Administered 2012-04-23: 4 mg via INTRAVENOUS
  Filled 2012-04-23: qty 2

## 2012-04-23 NOTE — ED Provider Notes (Signed)
History     CSN: [id number]  Arrival date & time 04/23/12  1238   First MD Initiated Contact with Patient 04/23/12 1307      Chief Complaint  Patient presents with  . Abdominal Pain    HPI Pt was seen at 1320.  Per NH report and pt, c/o gradual onset and worsening of persistent generalized abd "pain" since earlier today PTA.  Pt states she has had abd "bloating" and distention for the past 1-2 weeks.  States she was evaluated by her PMD and had "an xray" in [place] last week for same but does not know the results.  Denies fevers, no back pain, no N/V/D, no black or blood in stools.    Past Medical History  Diagnosis Date  . Depression   . Diabetes mellitus   . Neuropathy   . Mood disorder   . Hepatitis C   . TIA (transient ischemic attack)   . Pancreatitis chronic   . Chronic pain   . HIV (human immunodeficiency virus infection)   . Polysubstance abuse   . Mood disorder   . GERD (gastroesophageal reflux disease)     History reviewed. No pertinent past surgical history.  Family History  Problem Relation Age of Onset  . Cancer    . Diabetes      History  Substance Use Topics  . Smoking status: Current Everyday Smoker -- 0.5 packs/day    Types: Cigarettes  . Smokeless tobacco: Never Used  . Alcohol Use: No    Review of Systems ROS: Statement: All systems negative except as marked or noted in the HPI; Constitutional: Negative for fever and chills. ; ; Eyes: Negative for eye pain, redness and discharge. ; ; ENMT: Negative for ear pain, hoarseness, nasal congestion, sinus pressure and sore throat. ; ; Cardiovascular: Negative for chest pain, palpitations, diaphoresis, dyspnea and peripheral edema. ; ; Respiratory: Negative for cough, wheezing and stridor. ; ; Gastrointestinal: +abd pain.  Negative for nausea, vomiting, diarrhea, blood in stool, hematemesis, jaundice and rectal bleeding. . ; ; Genitourinary: Negative for dysuria, flank pain and hematuria. ; ;  Musculoskeletal: Negative for back pain and neck pain. Negative for swelling and trauma.; ; Skin: Negative for pruritus, rash, abrasions, blisters, bruising and skin lesion.; ; Neuro: Negative for headache, lightheadedness and neck stiffness. Negative for weakness, altered level of consciousness , altered mental status, extremity weakness, paresthesias, involuntary movement, seizure and syncope.     Allergies  Review of patient's allergies indicates no known allergies.  Home Medications   Current Outpatient Rx  Name Route Sig Dispense Refill  . ASPIRIN 81 MG PO TBEC Oral Take 1 tablet (81 mg total) by mouth daily. 30 tablet 6    Needs to keep scheduled appts.  . CETIRIZINE HCL 10 MG PO TABS Oral Take 1 tablet (10 mg total) by mouth at bedtime. 30 tablet 6  . CREON 6000 UNITS PO CPEP  TAKE 2 CAPSULES THREE TIMES DAILY WITH MEALS 180 each 2  . DOCUSATE SODIUM 100 MG PO CAPS Oral Take 1 capsule (100 mg total) by mouth daily. 30 capsule 6  . DULOXETINE HCL 30 MG PO CPEP Oral Take 30 mg by mouth daily.      . DULOXETINE HCL 60 MG PO CPEP Oral Take 60 mg by mouth daily.      . EFAVIRENZ-EMTRICITAB-TENOFOVIR 600-200-300 MG PO TABS Oral Take 1 tablet by mouth at bedtime. 30 tablet prn  . FERROUS SULFATE 325 (65 FE) MG PO  TABS  TAKE ONE TABLET BY MOUTH ONCE DAILY. 30 tablet 6  . GLUCOSE BLOOD VI STRP  Use to check blood sugar before each meal, at bedtime and when patient has symptoms of low blood sugar 153 each 12  . HYDROCODONE-ACETAMINOPHEN 5-325 MG PO TABS Oral Take 1 tablet by mouth every 6 (six) hours as needed. 60 tablet 5  . INSULIN ASPART 100 UNIT/ML [place] SOLN  6 units for each meal three times daily,  Add correction dose to mealtime insulin: 130-160 mg/dl add 1 unit to mealtime dose, 161-190- add 2 units, 191-220 add 3 units, 221-250 add 4 units, 251-280= 5 units, 281-310= 6 units, 311-340= 7 units, 341- 370 = 8 units, 371-400= 9 units, > or = 401 add 10 units to meal time dose, please call if  more than 12 > or = 401 in 1 week or other questions from 02/18/2011 15 mL 12  . INSULIN GLARGINE 100 UNIT/ML [place] SOLN Subcutaneous Inject 30 Units into the skin at bedtime.      . INSULIN PEN NEEDLE 31G X 8 MM MISC  Use to inject insulin at meal time and SSI 100 each 3  . ACCU-CHEK SOFTCLIX LANCET DEV MISC  Use to check blood sugar before each meal, at bedtime and when patient has symptoms of low blood sugar  200 each 11  . MELOXICAM 7.5 MG PO TABS Oral Take 1 tablet (7.5 mg total) by mouth daily. 30 tablet 2  . NORPRAMIN 25 MG PO TABS  TAKE (1) TABLET BY MOUTH AT BEDTIME. 30 each 2  . OMEPRAZOLE 20 MG PO CPDR Oral Take 1 capsule (20 mg total) by mouth daily. 30 capsule 6    Needs to keep scheduled appts.  Marland Kitchen POLYETHYLENE GLYCOL 3350 PO POWD Oral Take by mouth. As directed     . PREGABALIN 100 MG PO CAPS Oral Take 1 capsule (100 mg total) by mouth 3 (three) times daily. 90 capsule 2    Needs to keep scheduled appts.  Marland Kitchen PROMETHAZINE HCL 25 MG PO TABS Oral Take 25 mg by mouth daily.      Marland Kitchen REMERON 15 MG PO TABS  TAKE (1) TABLET BY MOUTH AT BEDTIME. 30 each 2  . TRAZODONE HCL 100 MG PO TABS Oral Take 1 tablet (100 mg total) by mouth at bedtime. 30 tablet 3    BP 161/89  Pulse 112  Temp(Src) 98 F (36.7 C) (Oral)  Resp 18  Ht 5\' 4"  (1.626 m)  Wt 159 lb (72.122 kg)  BMI 27.29 kg/m2  SpO2 97%  Physical Exam 1325: Physical examination:  Nursing notes reviewed; Vital signs and O2 SAT reviewed;  Constitutional: Well developed, Well nourished, Uncomfortable appearing;; Head:  Normocephalic, atraumatic; Eyes: EOMI, PERRL, No scleral icterus; ENMT: Mouth and pharynx normal, Mucous membranes dry; Neck: Supple, Full range of motion, No lymphadenopathy; Cardiovascular: Regular rate and rhythm, No murmur, rub, or gallop; Respiratory: Breath sounds clear & equal bilaterally, No rales, rhonchi, wheezes, speaking full sentences with ease. Normal respiratory effort/excursion; Chest: Nontender, Movement  normal; Abdomen: +diffusely tender to palp, +softly distended, Normal bowel sounds;; Extremities: Pulses normal, No tenderness, No edema, No calf edema or asymmetry.; Neuro: AA&Ox3, Major CN grossly intact.  No gross focal motor or sensory deficits in extremities.; Skin: Color normal, Warm, Dry.   ED Course  Procedures   MDM  MDM Reviewed: nursing note, vitals and previous chart Reviewed previous: ultrasound and labs Interpretation: labs and CT scan   Results for  orders placed in visit on 04/13/12  HIV 1 RNA QUANT-NO REFLEX-BLD      Component Value Range   HIV 1 RNA Quant <20  <20 (copies/mL)   HIV1 RNA Quant, Log <1.30  <1.30 (log 10)  RPR      Component Value Range   RPR REACTIVE (*) NON REAC    Total CHOL/HDL Ratio 2.9     VLDL 23  0 - 40 (mg/dL)   LDL Cholesterol 68  0 - 99 (mg/dL)  T-HELPER CELL (CD4)      Component Value Range   CD4 T Cell Abs 650  400 - 2700 (cmm)   CD4 % Helper T Cell 31 (*) 33 - 55 (%)  RPR TITER      Component Value Range   RPR Titer 1:4    T.PALLIDUM AB, TOTAL      Component Value Range   T pallidum Antibodies (TP-PA) >8.00 (*) <0.90 (S/CO)   US Abdomen Complete 04/14/2012  *RADIOLOGY REPORT*  Clinical Data:  Abdominal distention  COMPLETE ABDOMINAL ULTRASOUND  Comparison:  Abdomen film of 09/04/2010  Findings:  Gallbladder:  The gallbladder is visualized and no gallstones are seen.  There is no pain over the gallbladder with compression.  Common bile duct:  The common bile duct is normal measuring 3.0 mm in diameter.  Liver:  The liver has a normal echogenic pattern.  No ductal dilatation is seen.  IVC:  Appears normal.  Pancreas:  The pancreas is echogenic an irregular consistent with chronic calcific pancreatitis. Chronic, calcific pancreatitis was noted on the prior CT from 2010.  The pancreatic duct however was not significantly dilated at that time. The pancreatic duct is now very dilated measuring 7.9 mm most likely due to chronic pancreatitis but  obstruction of the pancreatic duct cannot be excluded.  CT or MRI may be helpful to assess further if warranted clinically.  Spleen:  The spleen is normal measuring 6.5 cm sagittally.  Right Kidney:  No hydronephrosis is seen.  The right kidney measures 11.4 cm sagittally.  Left Kidney:  No hydronephrosis is noted.  Left kidney measures 12.3 cm.  Abdominal aorta:  The abdominal aorta is normal in caliber.  No ascites is seen.  IMPRESSION:  1.  Changes of chronic calcific pancreatitis.  The pancreatic duct is very dilated which is not definitely seen on the prior CT. Obstruction of the pancreatic duct cannot be excluded.  Consider CT of the abdomen to assess further. 2.  No gallstones. 3.  The liver is normal in echogenicity.  No ascites is seen.  Original Report Authenticated By: Juline Patch, M.D.    1330:  Results from previous labs and [place] from 04/14/12 as above.  Will recheck labs and CT A/P today.     Results for orders placed during the hospital encounter of 04/23/12  CBC      Component Value Range   WBC 6.1  4.0 - 10.5 (K/uL)   RBC 4.13  3.87 - 5.11 (MIL/uL)   Hemoglobin 12.2  12.0 - 15.0 (g/dL)   HCT 16.1  09.6 - 04.5 (%)   MCV 91.8  78.0 - 100.0 (fL)   MCH 29.5  26.0 - 34.0 (pg)   MCHC 32.2  30.0 - 36.0 (g/dL)   RDW 40.9  81.1 - 91.4 (%)   Platelets 236  150 - 400 (K/uL)  DIFFERENTIAL      Component Value Range   Neutrophils Relative 53  43 - 77 (%)  Neutro Abs 3.2  1.7 - 7.7 (K/uL)   Lymphocytes Relative 35  12 - 46 (%)   Lymphs Abs 2.1  0.7 - 4.0 (K/uL)   Monocytes Relative 9  3 - 12 (%)   Monocytes Absolute 0.5  0.1 - 1.0 (K/uL)   Eosinophils Relative 3  0 - 5 (%)   Eosinophils Absolute 0.2  0.0 - 0.7 (K/uL)   Basophils Relative 1  0 - 1 (%)   Basophils Absolute 0.1  0.0 - 0.1 (K/uL)  COMPREHENSIVE METABOLIC PANEL      Component Value Range   Sodium 137  135 - 145 (mEq/L)   Potassium 4.0  3.5 - 5.1 (mEq/L)   Chloride 102  96 - 112 (mEq/L)   CO2 23  19 - 32 (mEq/L)    Glucose, Bld 252 (*) 70 - 99 (mg/dL)   BUN 18  6 - 23 (mg/dL)   Creatinine, Ser 1.61  0.50 - 1.10 (mg/dL)   Calcium 9.9  8.4 - 09.6 (mg/dL)   Total Protein 7.7  6.0 - 8.3 (g/dL)   Albumin 3.2 (*) 3.5 - 5.2 (g/dL)   AST 34  0 - 37 (U/L)   ALT 35  0 - 35 (U/L)   Alkaline Phosphatase 130 (*) 39 - 117 (U/L)   Total Bilirubin 0.2 (*) 0.3 - 1.2 (mg/dL)   GFR calc non Af Amer 60 (*) >90 (mL/min)   GFR calc Af Amer 70 (*) >90 (mL/min)  LIPASE, BLOOD      Component Value Range   Lipase 28  11 - 59 (U/L)  LACTIC ACID, PLASMA      Component Value Range   Lactic Acid, Venous 1.7  0.5 - 2.2 (mmol/L)  PROCALCITONIN      Component Value Range   Procalcitonin <0.10    URINALYSIS, ROUTINE W REFLEX MICROSCOPIC      Component Value Range   Color, Urine YELLOW  YELLOW    APPearance HAZY (*) CLEAR    Specific Gravity, Urine 1.020  1.005 - 1.030    pH 6.5  5.0 - 8.0    Glucose, UA >1000 (*) NEGATIVE (mg/dL)   Hgb urine dipstick TRACE (*) NEGATIVE    Bilirubin Urine NEGATIVE  NEGATIVE    Ketones, ur NEGATIVE  NEGATIVE (mg/dL)   Protein, ur NEGATIVE  NEGATIVE (mg/dL)   Urobilinogen, UA 0.2  0.0 - 1.0 (mg/dL)   Nitrite NEGATIVE  NEGATIVE    Leukocytes, UA NEGATIVE  NEGATIVE   URINE MICROSCOPIC-ADD ON      Component Value Range   Squamous Epithelial / LPF FEW (*) RARE    WBC, UA 3-6  <3 (WBC/hpf)   RBC / HPF 0-2  <3 (RBC/hpf)   Bacteria, UA MANY (*) RARE    Urine-Other TRICHOMONAS PRESENT      Ct Abdomen Pelvis W Contrast 04/23/2012  *RADIOLOGY REPORT*  Clinical Data: Abdominal pain and bloating.  CT ABDOMEN AND PELVIS WITH CONTRAST  Technique:  Multidetector CT imaging of the abdomen and pelvis was performed following the standard protocol during bolus administration of intravenous contrast.  Contrast: OMNIPAQUE IOHEXOL 300 MG/ML  SOLN  Comparison: CT of the abdomen and pelvis 12/04/2009.  Findings:  Lung Bases: There is a small hiatal hernia. Distal esophagus appears to be patulous and is  fluid-filled.  Calcifications of the mitral valve. Again noted is extensive calcification throughout the pancreatic parenchyma, consistent with sequela of chronic pancreatitis.  No definite focal pancreatic mass is identified on today's  examination.  There is a subcentimeter low attenuation lesion in the periphery of segment six of the liver (image 23 of series 2) which is too small to definitively characterize.  The remainder of the liver is otherwise unremarkable in appearance. The enhanced appearance of the gallbladder, spleen and bilateral adrenal glands is unremarkable.  There is mild multifocal parenchymal thinning in the kidneys bilaterally, likely reflects some mild scarring.  In the upper pole of the left kidney there is a subcentimeter low attenuation lesion measuring 5 mm that is too small to definitively characterize, but is unchanged compared to prior study from 12/04/2009 and is therefore favored to represent a small cyst.  Atherosclerosis of the abdominal and pelvic vasculature, without evidence of aneurysm or dissection at this time.  Abdomen/Pelvis:  Normal appendix.  No ascites or pneumoperitoneum and no pathologic distension of bowel.  No definite pathologic adenopathy identified within the abdomen or pelvis.  The uterus, bilateral ovaries and urinary bladder are unremarkable in appearance.  Musculoskeletal: There are no aggressive appearing lytic or blastic lesions noted in the visualized portions of the skeleton.  IMPRESSION: 1.  No acute findings in the abdomen or pelvis to account for the patient's symptoms. 2.  Sequelae of chronic pancreatitis redemonstrated, as above. 3.  Subcentimeter low attenuation lesion in the interpolar region of the left kidney is unchanged.  Although not fully characterized on today's examination, this is statistically favored to represent a small cyst. 4.  Subcentimeter low attenuation lesion in segment six of the liver is not characterized on this examination.  5.   Extensive atherosclerosis. 6.  Normal appendix. 7.  Additional incidental findings, as above.  Original Report Authenticated By: Florencia Reasons, M.D.      1700:  Pt has been ambulatory around the ED, steady gait, easy resps.  Afebrile with normal WBC count, lactic acid and procalcitonin.  CT appears unchanged from previous in 2010 and is without acute findings to account for pt's pain.  Glucose elevated, has hx of DM, is not acidotic today.  No N/V/D while in the ED.  Will tx for UTI and trich, UC is pending.  Dx testing d/w pt.  Questions answered.  Verb understanding, agreeable to d/c home with outpt f/u.      Laray Anger, DO 04/25/12 1445

## 2012-04-23 NOTE — ED Notes (Signed)
[facility] contacted. They will send a CNA to pick the patient up as soon as possible. Report given to [facility].

## 2012-04-23 NOTE — Discharge Instructions (Signed)
RESOURCE GUIDE  Dental Problems  Patients with Medicaid: [facility]                     [facility] [address]                                           [address] Phone:  [phone]                                                  Phone:  [phone]  If unable to pay or uninsured, contact:  [facility] or [facility] to become qualified for the adult dental clinic.  Chronic Pain Problems Contact [facility]  [phone] Patients need to be referred by their primary care doctor.  Insufficient Money for Medicine Contact [employer]:  call "211" or [facility] 271-5999.  No Primary Care Doctor Call [facility]  832-8000 Other agencies that provide inexpensive medical care    [facility]  [phone]    [facility]  [phone]    [facility]  [phone]    Women's Clinic  [phone]    [facility]  [phone]    [facility]  [phone]  Psychological Services [facility]  [phone] [facility]  [phone] [facility]   800 853-5163 (emergency services 641-4993)  Substance Abuse Resources [facility]  336-882-2125 [facility] 336-784-9470 [facility] 336-285-9073 [facility] 336-845-3988 Residential & Outpatient Substance Abuse Program  800-659-3381  Abuse/Neglect [place] Child Abuse Hotline (336) 641-3795 [place] Child Abuse Hotline 800-378-5315 (After Hours)  Emergency Shelter [facility] (336) 271-5985  Maternity Homes [facility] (336) 275-9566 [facility] (704) 372-4663  MRSA Hotline #:   832-7006    [place] Resources  [facility] [place]     [employer]                          [facility] [address] [place]                       [address]      [address]  [place]                                                [place]                            [place] Phone:  [phone]                                   Phone:  [phone]                 Phone:  [phone]  [facility] Phone:  [phone]    [facility] Child Abuse Hotline 438-206-9360 5137581906 (After Hours)    Take the prescriptions as directed.  Call your regular medical doctor on Monday to schedule a follow up appointment within the next 2 to 3 days.  Return to the Emergency Department immediately sooner if worsening.

## 2012-04-23 NOTE — ED Notes (Signed)
Patient with no complaints at this time. Respirations even and unlabored. Skin warm/dry. Discharge instructions reviewed with patient at this time. Patient given opportunity to voice concerns/ask questions. IV removed per policy and band-aid applied to site. Patient discharged at this time and left Emergency Department with steady gait.  

## 2012-04-23 NOTE — ED Notes (Signed)
Patient assisted to restroom. Patient ambulatory with steady gait. Urine specimen collected.

## 2012-04-23 NOTE — ED Notes (Signed)
Patient arrives via EMS from [facility] with c/o abdominal pain and distention. Patient reports scans were done somewhere in [place] for abdominal pain this past Thursday, has not gotten results.

## 2012-04-23 NOTE — ED Notes (Signed)
Spoke with Nurse that takes care of patient at [facility]. States patient started c/o abdominal pain that started one hour ago. Patient seen by Dr Sundra Aland at [facility] on Thursday for Abdominal distention, but that she was not c/o pain at that time. Staff denies n/v/d. Patient currently moaning and holding stomach.

## 2012-04-26 ENCOUNTER — Telehealth: Payer: Self-pay | Admitting: *Deleted

## 2012-04-26 LAB — URINE CULTURE

## 2012-04-26 NOTE — Telephone Encounter (Signed)
Called patient's transportation and notified she has an appt tomorrow with Dr. Daiva Eves. Wendall Mola

## 2012-04-26 NOTE — Telephone Encounter (Signed)
Sounds good

## 2012-04-26 NOTE — Telephone Encounter (Signed)
Darel Hong from patient's skilled nursing facility called stating that they will soon be getting an MD to act as patient's PCP, but they need it Okd by Dr. Daiva Eves first.  Asked her to please type up a form that our MD could sign.  She agreed to this, and patient has appointment with Dr. Daiva Eves tomorrow and will bring form with her. Wendall Mola CMA

## 2012-04-27 ENCOUNTER — Encounter: Payer: Self-pay | Admitting: Infectious Disease

## 2012-04-27 ENCOUNTER — Ambulatory Visit (INDEPENDENT_AMBULATORY_CARE_PROVIDER_SITE_OTHER): Payer: Medicaid Other | Admitting: Infectious Disease

## 2012-04-27 VITALS — BP 128/86 | HR 111 | Temp 98.2°F | Ht 63.0 in | Wt 159.5 lb

## 2012-04-27 DIAGNOSIS — K861 Other chronic pancreatitis: Secondary | ICD-10-CM

## 2012-04-27 DIAGNOSIS — E109 Type 1 diabetes mellitus without complications: Secondary | ICD-10-CM

## 2012-04-27 DIAGNOSIS — N390 Urinary tract infection, site not specified: Secondary | ICD-10-CM | POA: Insufficient documentation

## 2012-04-27 DIAGNOSIS — B20 Human immunodeficiency virus [HIV] disease: Secondary | ICD-10-CM

## 2012-04-27 DIAGNOSIS — B1710 Acute hepatitis C without hepatic coma: Secondary | ICD-10-CM

## 2012-04-27 NOTE — Assessment & Plan Note (Signed)
Check hepatitis C viral load and genotype at next visit.

## 2012-04-27 NOTE — ED Notes (Signed)
+  cipro Patient  treated with Cipro-sensitive to same-chart appended per protocol MD.

## 2012-04-27 NOTE — Assessment & Plan Note (Signed)
Complete seven-day course of antibiotics

## 2012-04-27 NOTE — Assessment & Plan Note (Signed)
Patient is being followed by the physician at her skilled nursing facility for this.

## 2012-04-27 NOTE — Assessment & Plan Note (Signed)
Superb control continue Atripla 

## 2012-04-27 NOTE — Progress Notes (Signed)
  Subjective:    Patient ID: [name], female    DOB: 1956/04/05, 56 y.o.   MRN: [id number]  HPI  56 year old [place] American females American female with HIV superbly to well controlled on her Atripla.  He continues to reside in skilled nursing facility. She was recently seen in the emergency department and evaluated for abdominal pain and diagnosed with urinary tract infection with Escherichia coli sensitive to cephalexin and she is on currently a regimen of cephalexin. She is otherwise doing relatively well she does have neuropathic pain in her feet. She didn't require requests prescriptions for TED hose support shot stockings. Review of Systems  Constitutional: Negative for fever, chills, diaphoresis, activity change, appetite change, fatigue and unexpected weight change.  HENT: Negative for congestion, sore throat, rhinorrhea, sneezing, trouble swallowing and sinus pressure.   Eyes: Negative for photophobia and visual disturbance.  Respiratory: Negative for cough, chest tightness, shortness of breath, wheezing and stridor.   Cardiovascular: Negative for chest pain, palpitations and leg swelling.  Gastrointestinal: Positive for abdominal pain and abdominal distention. Negative for nausea, vomiting, diarrhea, constipation, blood in stool and anal bleeding.  Genitourinary: Negative for dysuria, hematuria, flank pain and difficulty urinating.  Musculoskeletal: Negative for myalgias, back pain, joint swelling and arthralgias.  Skin: Negative for color change, pallor, rash and wound.  Neurological: Negative for dizziness, tremors, weakness and light-headedness.  Hematological: Negative for adenopathy. Does not bruise/bleed easily.  Psychiatric/Behavioral: Negative for behavioral problems, confusion, sleep disturbance, dysphoric mood, decreased concentration and agitation.       Objective:   Physical Exam  Constitutional: She is oriented to person, place, and time. She appears well-developed and  well-nourished. No distress.  HENT:  Head: Normocephalic and atraumatic.  Mouth/Throat: Oropharynx is clear and moist. No oropharyngeal exudate.  Eyes: Conjunctivae and EOM are normal. Pupils are equal, round, and reactive to light. No scleral icterus.  Neck: Normal range of motion. Neck supple. No JVD present.  Cardiovascular: Normal rate, regular rhythm and normal heart sounds.  Exam reveals no gallop and no friction rub.   No murmur heard. Pulmonary/Chest: Effort normal and breath sounds normal. No respiratory distress. She has no wheezes. She has no rales. She exhibits no tenderness.  Abdominal: She exhibits no distension and no mass. There is no tenderness. There is no rebound and no guarding.  Musculoskeletal: She exhibits no edema and no tenderness.  Lymphadenopathy:    She has no cervical adenopathy.  Neurological: She is alert and oriented to person, place, and time. She has normal reflexes. She exhibits normal muscle tone. Coordination normal.  Skin: Skin is warm and dry. She is not diaphoretic. No erythema. No pallor.  Psychiatric: She has a normal mood and affect. Her behavior is normal. Judgment and thought content normal.          Assessment & Plan:  HIV INFECTION Superb control continue Atripla  CHRONIC PANCREATITIS Continue pancreatic replacement  IDDM Patient is being followed by the physician at her skilled nursing facility for this.  HEPATITIS C Check hepatitis C viral load and genotype at next visit.  UTI (lower urinary tract infection) Complete seven-day course of antibiotics

## 2012-04-27 NOTE — Assessment & Plan Note (Signed)
Continue pancreatic replacement 

## 2012-04-28 ENCOUNTER — Telehealth: Payer: Self-pay | Admitting: *Deleted

## 2012-04-28 NOTE — Telephone Encounter (Signed)
Patient's Assisted Living called stating she was given an Rx for support hose, but it is not clear what compression she needs so the pharmacy can not fill it.  Please advise Wendall Mola CMA

## 2012-04-28 NOTE — Telephone Encounter (Signed)
I spoke with the pharmacy and there are different compressions depending on how strong you want.  Ted hose are light compression 18 mm, then there is 15-20 mm, 20-30 mm, and 30-40 mm, the higher the number the stronger the compression.  They also want to know knee high or thigh high. Wendall Mola CMA

## 2012-04-28 NOTE — Telephone Encounter (Signed)
I dont know what the various types of compression stocking are. Can we find out what the options are I never write for this kind of stuff

## 2012-04-28 NOTE — Telephone Encounter (Signed)
Lets try 15-20

## 2012-04-29 ENCOUNTER — Other Ambulatory Visit: Payer: Self-pay | Admitting: *Deleted

## 2012-05-25 ENCOUNTER — Ambulatory Visit (INDEPENDENT_AMBULATORY_CARE_PROVIDER_SITE_OTHER): Payer: Medicaid Other | Admitting: *Deleted

## 2012-05-25 ENCOUNTER — Other Ambulatory Visit (HOSPITAL_COMMUNITY)
Admission: RE | Admit: 2012-05-25 | Discharge: 2012-05-25 | Disposition: A | Discharge status: Home / Self Care | Payer: Medicaid Other | Source: Ambulatory Visit | Attending: Internal Medicine | Admitting: Internal Medicine

## 2012-05-25 DIAGNOSIS — Z01419 Encounter for gynecological examination (general) (routine) without abnormal findings: Secondary | ICD-10-CM | POA: Insufficient documentation

## 2012-05-25 DIAGNOSIS — Z124 Encounter for screening for malignant neoplasm of cervix: Secondary | ICD-10-CM

## 2012-05-25 NOTE — Patient Instructions (Signed)
Your results will be ready in about a week.  I will mail them to you.  Thank you for coming to the Center for your care.  Mukhtar Shams,  RN 

## 2012-05-25 NOTE — Progress Notes (Signed)
  Subjective:     [name] is a 56 y.o. woman who comes in today for a  pap smear only. Contraception: Menopausal  Objective:    There were no vitals taken for this visit. Pelvic Exam:. Pap smear obtained.   Assessment:    Screening pap smear.   Plan:    Follow up in one year, or as indicated by Pap results.  Pt given educational materials re:  HIV and women, living with HIV over 50, PAP smears and HIV and heart disease.

## 2012-06-03 ENCOUNTER — Encounter: Payer: Self-pay | Admitting: *Deleted

## 2012-06-13 ENCOUNTER — Ambulatory Visit (INDEPENDENT_AMBULATORY_CARE_PROVIDER_SITE_OTHER): Payer: Medicaid Other | Admitting: Cardiology

## 2012-06-13 ENCOUNTER — Encounter: Payer: Self-pay | Admitting: Cardiology

## 2012-06-13 VITALS — BP 133/79 | HR 109 | Ht 64.0 in | Wt 160.8 lb

## 2012-06-13 DIAGNOSIS — R0989 Other specified symptoms and signs involving the circulatory and respiratory systems: Secondary | ICD-10-CM

## 2012-06-13 DIAGNOSIS — Z136 Encounter for screening for cardiovascular disorders: Secondary | ICD-10-CM

## 2012-06-13 DIAGNOSIS — R000 Tachycardia, unspecified: Secondary | ICD-10-CM | POA: Insufficient documentation

## 2012-06-13 NOTE — Progress Notes (Signed)
HPI The patient has no prior cardiac history. She lives at an assisted living facility. Apparently she has been noted there to have rapid hearts.  I see a mention of heart rates from 90-120. The patient actually denies any palpitations, presyncope or syncope. He does not report chest pressure, neck or arm discomfort. She says she does get short of breath walking on level ground for a moderate distance with this has apparently been chronic. She sleeps with her head slightly elevated but denies any PND or orthopnea. She says she doesn't do much walking because of "poor circulation". She does wear compression stockings. She doesn't report any prior cardiac workup. She is limited in her activities.   No Known Allergies  Current Outpatient Prescriptions  Medication Sig Dispense Refill  . aspirin 81 MG EC tablet Take 1 tablet (81 mg total) by mouth daily.  30 tablet  6  . cetirizine (ZYRTEC) 10 MG tablet Take 10 mg by mouth daily.      Marland Kitchen docusate sodium (COLACE) 100 MG capsule Take 1 capsule (100 mg total) by mouth daily.  30 capsule  6  . efavirenz-emtrictabine-tenofovir (ATRIPLA) 600-200-300 MG per tablet Take 1 tablet by mouth at bedtime.  30 tablet  prn  . ferrous sulfate 325 (65 FE) MG tablet TAKE ONE TABLET BY MOUTH ONCE DAILY.  30 tablet  6  . glucose blood (ACCU-CHEK COMPACT TEST DRUM) test strip Use to check blood sugar before each meal, at bedtime and when patient has symptoms of low blood sugar  153 each  12  . HYDROcodone-acetaminophen (NORCO) 5-325 MG per tablet Take 1 tablet by mouth every 6 (six) hours as needed. pain      . insulin aspart (NOVOLOG) 100 UNIT/ML injection Inject 10 Units into the skin 3 (three) times daily before meals. Inject 10 units plus 1 unit for every 30 MG/DL above 409 MG/DL subcutaneously at meals.      . insulin glargine (LANTUS) 100 UNIT/ML injection Inject 52 Units into the skin at bedtime.       . Insulin Pen Needle 31G X 8 MM MISC Use to inject insulin at  meal time and SSI  100 each  3  . Lancet Devices (ACCU-CHEK SOFTCLIX) lancets Use to check blood sugar before each meal, at bedtime and when patient has symptoms of low blood sugar   200 each  11  . meloxicam (MOBIC) 7.5 MG tablet Take 1 tablet (7.5 mg total) by mouth daily.  30 tablet  2  . NORPRAMIN 25 MG tablet TAKE (1) TABLET BY MOUTH AT BEDTIME.  30 each  2  . omeprazole (PRILOSEC) 20 MG capsule Take 1 capsule (20 mg total) by mouth daily.  30 capsule  6  . Pancrelipase, Lip-Prot-Amyl, (CREON) 6000 UNITS CPEP Take 6,000-12,000 Units by mouth 3 (three) times daily. Patient takes 2 capsules three times daily with meals and 1 capsule three times daily with snacks      . polyethylene glycol (MIRALAX) powder Take 17 g by mouth as needed. constipation      . pregabalin (LYRICA) 100 MG capsule Take 1 capsule (100 mg total) by mouth 3 (three) times daily.  90 capsule  2  . REMERON 15 MG tablet TAKE (1) TABLET BY MOUTH AT BEDTIME.  30 each  2  . traZODone (DESYREL) 100 MG tablet Take 1 tablet (100 mg total) by mouth at bedtime.  30 tablet  3    Past Medical History  Diagnosis Date  .  Depression   . Diabetes mellitus   . Neuropathy   . Mood disorder   . Hepatitis C   . TIA (transient ischemic attack)   . Pancreatitis chronic   . Chronic pain   . HIV (human immunodeficiency virus infection)   . Polysubstance abuse   . Mood disorder   . GERD (gastroesophageal reflux disease)     No past surgical history on file.  Family History  Problem Relation Age of Onset  . Cancer    . Diabetes      History   Social History  . Marital Status: Single    Spouse Name: N/A    Number of Children: N/A  . Years of Education: 12th grade   Occupational History  .     Social History Main Topics  . Smoking status: Current Everyday Smoker -- 0.5 packs/day    Types: Cigarettes  . Smokeless tobacco: Never Used  . Alcohol Use: No  . Drug Use: No  . Sexually Active: No     pt. declined condoms    Other Topics Concern  . Not on file   Social History Narrative   Alcohol use- denies currently Hx of cocaine use - pt denies current illicit drug useLives in ALF    ROS:  Positive for abdominal distension and mild discomfort for the last couple of days.  Otherwise as stated in the HPI and negative for all other systems.  PHYSICAL EXAM BP 133/79  Pulse 109  Ht 5\' 4"  (1.626 m)  Wt 160 lb 12.8 oz (72.938 kg)  BMI 27.60 kg/m2 GENERAL:  Well appearing HEENT:  Pupils equal round and reactive, fundi not visualized, oral mucosa unremarkable NECK:  No jugular venous distention, waveform within normal limits, carotid upstroke brisk and symmetric, right bruits, no thyromegaly LYMPHATICS:  No cervical, inguinal adenopathy LUNGS:  Clear to auscultation bilaterally BACK:  No CVA tenderness CHEST:  Unremarkable HEART:  PMI not displaced or sustained,S1 and S2 within normal limits, no S3, no S4, no clicks, no rubs, no murmurs ABD:  Flat, positive bowel sounds normal in frequency in pitch, no bruits, no rebound, no guarding, no midline pulsatile mass, no hepatomegaly, no splenomegaly, mildly distended with mild diffuse tenderness to deep palpation EXT:  2 plus pulses upper, diminished dorsalis pedis and posterior tibialis bilaterally, no edema, no cyanosis no clubbing SKIN:  No rashes no nodules NEURO:  Cranial nerves II through XII grossly intact, motor grossly intact throughout PSYCH:  Cognitively intact, oriented to person place and time  EKG:  Sinus tachycardia axis within normal limits, intervals within normal limits, no acute ST-T wave changes.   ASSESSMENT AND PLAN

## 2012-06-13 NOTE — Patient Instructions (Addendum)
Your physician recommends that you schedule a follow-up appointment in: 1 month with Dr. Antoine Poche after all tests are completed.  Your physician has requested that you have an echocardiogram. Echocardiography is a painless test that uses sound waves to create images of your heart. It provides your doctor with information about the size and shape of your heart and how well your heart's chambers and valves are working. This procedure takes approximately one hour. There are no restrictions for this procedure.  Your physician has requested that you have a carotid duplex. This test is an ultrasound of the carotid arteries in your neck. It looks at blood flow through these arteries that supply the brain with blood. Allow one hour for this exam. There are no restrictions or special instructions.   Your physician recommends that you return for lab work in: today for [facility]  Your physician has recommended that you wear a 24 hour holter monitor. Holter monitors are medical devices that record the heart's electrical activity. Doctors most often use these monitors to diagnose arrhythmias. Arrhythmias are problems with the speed or rhythm of the heartbeat. The monitor is a small, portable device. You can wear one while you do your normal daily activities. This is usually used to diagnose what is causing palpitations/syncope (passing out).

## 2012-06-13 NOTE — Assessment & Plan Note (Signed)
She has a carotid bruit. There is a scar on her right carotid but she doesn't recall how this got better. She suspects it was a flight in her youth.

## 2012-06-13 NOTE — Assessment & Plan Note (Signed)
I will start with a TSH, echocardiogram and 24-hour Holter monitor to further evaluate. I will then see her back to review these and decide further treatment or evaluation.

## 2012-06-14 ENCOUNTER — Telehealth: Payer: Self-pay | Admitting: *Deleted

## 2012-06-14 NOTE — Telephone Encounter (Signed)
Message copied by Eustace Moore on Tue Jun 14, 2012  4:44 PM ------      Message from: Rollene Rotunda      Created: Tue Jun 14, 2012  4:18 PM       Normal.

## 2012-06-14 NOTE — Telephone Encounter (Signed)
Patients caregiver informed

## 2012-06-23 ENCOUNTER — Other Ambulatory Visit (INDEPENDENT_AMBULATORY_CARE_PROVIDER_SITE_OTHER): Payer: Medicaid Other

## 2012-06-23 ENCOUNTER — Other Ambulatory Visit: Payer: Self-pay

## 2012-06-23 DIAGNOSIS — R000 Tachycardia, unspecified: Secondary | ICD-10-CM

## 2012-06-28 ENCOUNTER — Telehealth: Payer: Self-pay | Admitting: *Deleted

## 2012-06-28 NOTE — Telephone Encounter (Signed)
Patient informed. 

## 2012-06-28 NOTE — Telephone Encounter (Signed)
Message copied by Eustace Moore on Tue Jun 28, 2012  9:39 AM ------      Message from: Rollene Rotunda      Created: Fri Jun 24, 2012  3:58 PM       Mildly reduced EF.  The patient has follow up scheduled.  Call Ms. Ferraiolo with the results.

## 2012-06-30 ENCOUNTER — Encounter (INDEPENDENT_AMBULATORY_CARE_PROVIDER_SITE_OTHER): Payer: Medicaid Other

## 2012-06-30 DIAGNOSIS — I6529 Occlusion and stenosis of unspecified carotid artery: Secondary | ICD-10-CM

## 2012-06-30 DIAGNOSIS — R0989 Other specified symptoms and signs involving the circulatory and respiratory systems: Secondary | ICD-10-CM

## 2012-07-18 ENCOUNTER — Encounter: Payer: Self-pay | Admitting: Cardiology

## 2012-07-18 ENCOUNTER — Ambulatory Visit (INDEPENDENT_AMBULATORY_CARE_PROVIDER_SITE_OTHER): Payer: Medicaid Other | Admitting: Cardiology

## 2012-07-18 ENCOUNTER — Encounter: Payer: Self-pay | Admitting: *Deleted

## 2012-07-18 VITALS — BP 143/89 | HR 108 | Ht 64.0 in | Wt 160.0 lb

## 2012-07-18 DIAGNOSIS — I428 Other cardiomyopathies: Secondary | ICD-10-CM

## 2012-07-18 DIAGNOSIS — I429 Cardiomyopathy, unspecified: Secondary | ICD-10-CM

## 2012-07-18 MED ORDER — METOPROLOL SUCCINATE ER 50 MG PO TB24: 50.0000 mg | ORAL_TABLET | Freq: Every day | ORAL | Status: DC

## 2012-07-18 NOTE — Progress Notes (Signed)
HPI The patient has no prior cardiac history. She was referred for evaluation of tachycardia. Holter monitor demonstrated sinus tachycardia but no sustained other dysrhythmia. Echocardiogram demonstrated a mildly reduced ejection fraction of 45% with global hypokinesis without significant valvular abnormalities. Carotid study demonstrated a left 60-79% stenosis in the right 0-39% stenosis.  Since the last visit she has had no new symptoms. She still cannot stop smoking.  However, he patient denies any new symptoms such as chest discomfort, neck or arm discomfort. There has been no new shortness of breath, PND or orthopnea. There have been no reported palpitations, presyncope or syncope.  No Known Allergies  Current Outpatient Prescriptions  Medication Sig Dispense Refill  . aspirin 81 MG EC tablet Take 1 tablet (81 mg total) by mouth daily.  30 tablet  6  . efavirenz-emtrictabine-tenofovir (ATRIPLA) 600-200-300 MG per tablet Take 1 tablet by mouth at bedtime.  30 tablet  prn  . ferrous sulfate 325 (65 FE) MG tablet TAKE ONE TABLET BY MOUTH ONCE DAILY.  30 tablet  6  . glucose blood (ACCU-CHEK COMPACT TEST DRUM) test strip Use to check blood sugar before each meal, at bedtime and when patient has symptoms of low blood sugar  153 each  12  . HYDROcodone-acetaminophen (NORCO) 5-325 MG per tablet Take 1 tablet by mouth every 6 (six) hours as needed. pain      . insulin aspart (NOVOLOG) 100 UNIT/ML injection Inject 10 Units into the skin 3 (three) times daily before meals. Inject 10 units plus 1 unit for every 30 MG/DL above 119 MG/DL subcutaneously at meals.      . insulin glargine (LANTUS) 100 UNIT/ML injection Inject 52 Units into the skin at bedtime.       . Insulin Pen Needle 31G X 8 MM MISC Use to inject insulin at meal time and SSI  100 each  3  . Lancet Devices (ACCU-CHEK SOFTCLIX) lancets Use to check blood sugar before each meal, at bedtime and when patient has symptoms of low blood sugar  200 each  11  . meloxicam (MOBIC) 7.5 MG tablet Take 1 tablet (7.5 mg total) by mouth daily.  30 tablet  2  . NORPRAMIN 25 MG tablet TAKE (1) TABLET BY MOUTH AT BEDTIME.  30 each  2  . omeprazole (PRILOSEC) 20 MG capsule Take 1 capsule (20 mg total) by mouth daily.  30 capsule  6  . Pancrelipase, Lip-Prot-Amyl, (CREON) 6000 UNITS CPEP Take 6,000-12,000 Units by mouth 3 (three) times daily. Patient takes 2 capsules three times daily with meals and 1 capsule three times daily with snacks      . polyethylene glycol (MIRALAX) powder Take 17 g by mouth as needed. constipation      . REMERON 15 MG tablet TAKE (1) TABLET BY MOUTH AT BEDTIME.  30 each  2  . traZODone (DESYREL) 100 MG tablet Take 1 tablet (100 mg total) by mouth at bedtime.  30 tablet  3    Past Medical History  Diagnosis Date  . Depression   . Diabetes mellitus   . Neuropathy   . Mood disorder   . Hepatitis C   . TIA (transient ischemic attack)   . Pancreatitis chronic   . Chronic pain   . HIV (human immunodeficiency virus infection)   . Polysubstance abuse   . GERD (gastroesophageal reflux disease)     Past Surgical History  Procedure Date  . None     ROS:  Positive for  abdominal distension and mild discomfort for the last couple of days.  Otherwise as stated in the HPI and negative for all other systems.  PHYSICAL EXAM BP 143/89  Pulse 108  Ht 5\' 4"  (1.626 m)  Wt 160 lb (72.576 kg)  BMI 27.46 kg/m2  SpO2 98% GENERAL:  Well appearing HEENT:  Pupils equal round and reactive, fundi not visualized, oral mucosa unremarkable, poor dentition. NECK:  No jugular venous distention, waveform within normal limits, carotid upstroke brisk and symmetric, right bruit, no thyromegaly LUNGS:  Clear to auscultation bilaterally BACK:  No CVA tenderness HEART:  PMI not displaced or sustained,S1 and S2 within normal limits, no S3, no S4, no clicks, no rubs, no murmurs ABD:  Flat, positive bowel sounds normal in frequency in pitch,  no bruits, no rebound, no guarding, no midline pulsatile mass, no hepatomegaly, no splenomegaly, mildly distended with mild diffuse tenderness to deep palpation EXT:  2 plus pulses upper, diminished dorsalis pedis and posterior tibialis bilaterally, no edema, no cyanosis no clubbing NEURO:  Cranial nerves II through XII grossly intact, motor grossly intact throughout   ASSESSMENT AND PLAN  Tachycardia -  I don't see a secondary etiology for her tachycardia. I will start treating with metoprolol 50 mg daily.  Carotid stenosis -  She has moderate obstruction as above and will have follow up in six months.  Cardiomyopathy-  She does have a mildly reduced ejection fraction and obvious vascular disease with risk factors.   I will order a Lexiscan Myoview.  Tobacco abuse - She cannot quit smoking by her report.

## 2012-07-18 NOTE — Patient Instructions (Addendum)
Your physician recommends that you schedule a follow-up appointment in: 2 months with Dr. Antoine Poche. Your physician has recommended you make the following change in your medication: start toprol xl 50 mg daily. All other medications are still the same. Your physician has requested that you have en exercise stress myoview. For further information please visit [email or web address] Please follow instruction sheet, as given.

## 2012-07-19 ENCOUNTER — Other Ambulatory Visit: Payer: Self-pay | Admitting: Cardiology

## 2012-07-19 DIAGNOSIS — I429 Cardiomyopathy, unspecified: Secondary | ICD-10-CM

## 2012-07-26 DIAGNOSIS — I428 Other cardiomyopathies: Secondary | ICD-10-CM

## 2012-08-03 ENCOUNTER — Telehealth: Payer: Self-pay | Admitting: *Deleted

## 2012-08-03 NOTE — Telephone Encounter (Signed)
Message copied by Eustace Moore on Wed Aug 03, 2012  9:11 AM ------      Message from: Rollene Rotunda      Created: Mon Aug 01, 2012  3:19 PM       No evidence of ischemia.  Call Ms. Morrone with the results and send results to Acey Lav, MD

## 2012-08-03 NOTE — Telephone Encounter (Signed)
Patient informed,spoked with Selena Batten.

## 2012-08-15 ENCOUNTER — Other Ambulatory Visit: Payer: Medicaid Other

## 2012-08-15 DIAGNOSIS — B20 Human immunodeficiency virus [HIV] disease: Secondary | ICD-10-CM

## 2012-08-15 LAB — COMPLETE METABOLIC PANEL WITH GFR
ALT: 21 U/L (ref 0–35)
AST: 24 U/L (ref 0–37)
Albumin: 3.5 g/dL (ref 3.5–5.2)
Alkaline Phosphatase: 96 U/L (ref 39–117)
Potassium: 4.2 mEq/L (ref 3.5–5.3)
Sodium: 138 mEq/L (ref 135–145)
Total Bilirubin: 0.3 mg/dL (ref 0.3–1.2)
Total Protein: 7.2 g/dL (ref 6.0–8.3)

## 2012-08-15 LAB — CBC WITH DIFFERENTIAL/PLATELET
Basophils Absolute: 0 10*3/uL (ref 0.0–0.1)
Eosinophils Relative: 4 % (ref 0–5)
Lymphocytes Relative: 38 % (ref 12–46)
Lymphs Abs: 2.5 10*3/uL (ref 0.7–4.0)
MCV: 90.3 fL (ref 78.0–100.0)
Neutro Abs: 3 10*3/uL (ref 1.7–7.7)
Platelets: 204 10*3/uL (ref 150–400)
RBC: 3.92 MIL/uL (ref 3.87–5.11)
WBC: 6.6 10*3/uL (ref 4.0–10.5)

## 2012-08-16 LAB — HIV-1 RNA QUANT-NO REFLEX-BLD
HIV 1 RNA Quant: <20 copies/mL (ref <20)
HIV-1 RNA Quant, Log: <1.3 {Log} (ref <1.30)

## 2012-08-29 ENCOUNTER — Ambulatory Visit (INDEPENDENT_AMBULATORY_CARE_PROVIDER_SITE_OTHER): Payer: Medicaid Other | Admitting: Infectious Disease

## 2012-08-29 ENCOUNTER — Encounter: Payer: Self-pay | Admitting: Infectious Disease

## 2012-08-29 VITALS — BP 123/80 | HR 89 | Temp 98.2°F | Wt 161.0 lb

## 2012-08-29 DIAGNOSIS — E109 Type 1 diabetes mellitus without complications: Secondary | ICD-10-CM

## 2012-08-29 DIAGNOSIS — Z23 Encounter for immunization: Secondary | ICD-10-CM

## 2012-08-29 DIAGNOSIS — B1710 Acute hepatitis C without hepatic coma: Secondary | ICD-10-CM

## 2012-08-29 DIAGNOSIS — B20 Human immunodeficiency virus [HIV] disease: Secondary | ICD-10-CM

## 2012-08-29 NOTE — Assessment & Plan Note (Signed)
folllowed by Dr. Sharl Ma

## 2012-08-29 NOTE — Progress Notes (Signed)
  Subjective:    Patient ID: [name], female    DOB: [birth year], 56 y.o.   MRN: [id number]  HPI  [name] is a 56 y.o. female who is doing superbly well on her  antiviral regimen, atripla with undetectable viral load and health cd4 count. SHe had exercise stress test which failed to show any evidence of ischemia. She is still bothered by increase in her abdominal girth. Workup for pathology here other than fat gain has been negative. Otherwise she is doing fairly well. I would like her to see PCP for overall care. She is seeing Debara Pickett, MD for her Diabetes. I spent greater than 45 minutes with the patient including greater than 50% of time in face to face counsel of the patient and in coordination of their care.   Review of Systems  Constitutional: Negative for fever, chills, diaphoresis, activity change, appetite change, fatigue and unexpected weight change.  HENT: Negative for congestion, sore throat, rhinorrhea, sneezing, trouble swallowing and sinus pressure.   Eyes: Negative for photophobia and visual disturbance.  Respiratory: Negative for cough, chest tightness, shortness of breath, wheezing and stridor.   Cardiovascular: Negative for chest pain, palpitations and leg swelling.  Gastrointestinal: Positive for abdominal distention. Negative for nausea, vomiting, abdominal pain, diarrhea, constipation, blood in stool and anal bleeding.  Genitourinary: Negative for dysuria, hematuria, flank pain and difficulty urinating.  Musculoskeletal: Negative for myalgias, back pain, joint swelling, arthralgias and gait problem.  Skin: Negative for color change, pallor, rash and wound.  Neurological: Negative for dizziness, tremors, weakness and light-headedness.  Hematological: Negative for adenopathy. Does not bruise/bleed easily.  Psychiatric/Behavioral: Negative for behavioral problems, confusion, disturbed wake/sleep cycle, dysphoric mood, decreased concentration and agitation.         Objective:   Physical Exam  Constitutional: She is oriented to person, place, and time. She appears well-developed and well-nourished. No distress.  HENT:  Head: Normocephalic and atraumatic.  Mouth/Throat: Oropharynx is clear and moist. No oropharyngeal exudate.  Eyes: Conjunctivae normal and EOM are normal. Pupils are equal, round, and reactive to light. No scleral icterus.  Neck: Normal range of motion. Neck supple. No JVD present.  Cardiovascular: Normal rate, regular rhythm and normal heart sounds.  Exam reveals no gallop and no friction rub.   No murmur heard. Pulmonary/Chest: Effort normal and breath sounds normal. No respiratory distress. She has no wheezes. She has no rales. She exhibits no tenderness.  Abdominal: She exhibits distension. She exhibits no mass. There is no tenderness. There is no rebound and no guarding.  Musculoskeletal: She exhibits no edema and no tenderness.  Lymphadenopathy:    She has no cervical adenopathy.  Neurological: She is alert and oriented to person, place, and time. She has normal reflexes. She exhibits normal muscle tone. Coordination normal.  Skin: Skin is warm and dry. She is not diaphoretic. No erythema. No pallor.  Psychiatric: She has a normal mood and affect. Her behavior is normal. Judgment and thought content normal.          Assessment & Plan:  HIV INFECTION Perfect control!  HEPATITIS C Check Hep C Viral load and genotype at next visit  IDDM folllowed by Dr. Sharl Ma

## 2012-08-29 NOTE — Assessment & Plan Note (Signed)
Perfect control 

## 2012-08-29 NOTE — Assessment & Plan Note (Signed)
Check Hep C Viral load and genotype at next visit

## 2012-10-01 ENCOUNTER — Other Ambulatory Visit: Payer: Self-pay | Admitting: Infectious Disease

## 2012-10-01 ENCOUNTER — Other Ambulatory Visit: Payer: Self-pay | Admitting: Infectious Diseases

## 2012-10-10 ENCOUNTER — Other Ambulatory Visit: Payer: Self-pay | Admitting: Infectious Diseases

## 2012-10-17 ENCOUNTER — Ambulatory Visit (INDEPENDENT_AMBULATORY_CARE_PROVIDER_SITE_OTHER): Payer: Medicaid Other | Admitting: Cardiology

## 2012-10-17 ENCOUNTER — Encounter: Payer: Self-pay | Admitting: Cardiology

## 2012-10-17 VITALS — BP 135/84 | HR 88 | Ht 64.0 in | Wt 161.8 lb

## 2012-10-17 DIAGNOSIS — R0989 Other specified symptoms and signs involving the circulatory and respiratory systems: Secondary | ICD-10-CM

## 2012-10-17 DIAGNOSIS — M79609 Pain in unspecified limb: Secondary | ICD-10-CM

## 2012-10-17 DIAGNOSIS — R000 Tachycardia, unspecified: Secondary | ICD-10-CM

## 2012-10-17 DIAGNOSIS — M79606 Pain in leg, unspecified: Secondary | ICD-10-CM

## 2012-10-17 NOTE — Patient Instructions (Addendum)
Your physician recommends that you schedule a follow-up appointment in: 6 months. You will receive a reminder letter in the mail in about 4 months reminding you to call and schedule your appointment. If you don't receive this letter, please contact our office.  Your physician recommends that you continue on your current medications as directed. Please refer to the Current Medication list given to you today.  Your physician has requested that you have an ankle brachial index (ABI). During this test an ultrasound and blood pressure cuff are used to evaluate the arteries that supply the arms and legs with blood. Allow thirty minutes for this exam. There are no restrictions or special instructions.

## 2012-10-17 NOTE — Progress Notes (Signed)
HPI The patient has no prior cardiac history. She was referred for evaluation of tachycardia. Holter monitor demonstrated sinus tachycardia but no sustained other dysrhythmia. Echocardiogram demonstrated a mildly reduced ejection fraction of 45% with global hypokinesis without significant valvular abnormalities. Carotid study demonstrated a left 60-79% stenosis in the right 0-39% stenosis.  Stress perfusion study suggested no evidence of ischemia or infarct. I did start her on a beta blocker at the last appointment.  Since I last saw her she has done well. She denies any chest pressure, neck or arm discomfort. She does not feel any palpitations, presyncope or syncope. She has had no PND or orthopnea though she chronically sleeps in a hospital bed with her head elevated. She insists on smoking cigarettes.  She does describe some pain in her left toes.  No Known Allergies  Current Outpatient Prescriptions  Medication Sig Dispense Refill  . aspirin 81 MG EC tablet Take 1 tablet (81 mg total) by mouth daily.  30 tablet  6  . ATRIPLA 600-200-300 MG per tablet TAKE (1) TABLET BY MOUTH AT BEDTIME.  30 tablet  PRN  . cetirizine (ZYRTEC) 10 MG tablet Take 10 mg by mouth daily.      Marland Kitchen docusate sodium (COLACE) 100 MG capsule Take 100 mg by mouth daily.      . ferrous sulfate 325 (65 FE) MG tablet TAKE ONE TABLET BY MOUTH ONCE DAILY.  30 tablet  6  . glucose blood (ACCU-CHEK COMPACT TEST DRUM) test strip Use to check blood sugar before each meal, at bedtime and when patient has symptoms of low blood sugar  153 each  12  . HYDROcodone-acetaminophen (NORCO) 5-325 MG per tablet Take 1 tablet by mouth every 6 (six) hours as needed. pain      . insulin aspart (NOVOLOG) 100 UNIT/ML injection Inject 10 Units into the skin 3 (three) times daily before meals. Inject 10 units plus 1 unit for every 30 MG/DL above 161 MG/DL subcutaneously at meals.      . insulin glargine (LANTUS) 100 UNIT/ML injection Inject 52 Units  into the skin at bedtime.       . Insulin Pen Needle 31G X 8 MM MISC Use to inject insulin at meal time and SSI  100 each  3  . Lancet Devices (ACCU-CHEK SOFTCLIX) lancets Use to check blood sugar before each meal, at bedtime and when patient has symptoms of low blood sugar   200 each  11  . metoprolol succinate (TOPROL-XL) 50 MG 24 hr tablet Take 1 tablet (50 mg total) by mouth daily. Take with or immediately following a meal.  30 tablet  4  . MOBIC 7.5 MG tablet TAKE 1 TABLET ONCE DAILY WITH FOOD.  30 tablet  1  . NORPRAMIN 25 MG tablet TAKE (1) TABLET BY MOUTH AT BEDTIME.  30 each  2  . omeprazole (PRILOSEC) 20 MG capsule Take 1 capsule (20 mg total) by mouth daily.  30 capsule  6  . Pancrelipase, Lip-Prot-Amyl, (CREON) 6000 UNITS CPEP Take 6,000-12,000 Units by mouth 3 (three) times daily. Patient takes 2 capsules three times daily with meals and 1 capsule three times daily with snacks      . polyethylene glycol (MIRALAX) powder Take 17 g by mouth as needed. constipation      . pregabalin (LYRICA) 100 MG capsule Take 100 mg by mouth 3 (three) times daily.      Marland Kitchen REMERON 15 MG tablet TAKE (1) TABLET BY MOUTH AT  BEDTIME.  30 each  2  . traZODone (DESYREL) 100 MG tablet Take 1 tablet (100 mg total) by mouth at bedtime.  30 tablet  3    Past Medical History  Diagnosis Date  . Depression   . Diabetes mellitus   . Neuropathy   . Mood disorder   . Hepatitis C   . TIA (transient ischemic attack)   . Pancreatitis chronic   . Chronic pain   . HIV (human immunodeficiency virus infection)   . Polysubstance abuse   . GERD (gastroesophageal reflux disease)     Past Surgical History  Procedure Date  . None     ROS:  Positive for abdominal distension and mild discomfort for the last couple of days.  Otherwise as stated in the HPI and negative for all other systems.  PHYSICAL EXAM BP 135/84  Pulse 88  Ht 5\' 4"  (1.626 m)  Wt 161 lb 12.8 oz (73.392 kg)  BMI 27.77 kg/m2  SpO2 95% GENERAL:   Well appearing HEENT:  Pupils equal round and reactive, fundi not visualized, oral mucosa unremarkable, poor dentition. NECK:  No jugular venous distention, waveform within normal limits, carotid upstroke brisk and symmetric, right bruit, no thyromegaly LUNGS:  Clear to auscultation bilaterally BACK:  No CVA tenderness HEART:  PMI not displaced or sustained,S1 and S2 within normal limits, no S3, no S4, no clicks, no rubs, no murmurs ABD:  Flat, positive bowel sounds normal in frequency in pitch, no bruits, no rebound, no guarding, no midline pulsatile mass, no hepatomegaly, no splenomegaly, mildly distended with mild diffuse tenderness to deep palpation EXT:  2 plus pulses upper, diminished dorsalis pedis and posterior tibialis bilaterally, no edema, no cyanosis no clubbing NEURO:  Cranial nerves II through XII grossly intact, motor grossly intact throughout   ASSESSMENT AND PLAN  Tachycardia -  I don't see a secondary etiology for her tachycardia. She is improved with beta blocker and she will continue the meds as listed.  Carotid stenosis -  She has moderate obstruction with 60-79% left stenosis and 0-39% right stenosis. She will have followup in January.  Cardiomyopathy-  She had a negative perfusion study in this appears to be nonischemic. No further evaluation is planned and we will manage this medically.  Tobacco abuse - She cannot quit smoking by her report. I discussed this with her again today.   Leg pain  - I will order ABIs. She does have a known neuropathy.

## 2012-10-19 ENCOUNTER — Encounter (INDEPENDENT_AMBULATORY_CARE_PROVIDER_SITE_OTHER): Payer: Medicaid Other

## 2012-10-19 DIAGNOSIS — I70219 Atherosclerosis of native arteries of extremities with intermittent claudication, unspecified extremity: Secondary | ICD-10-CM

## 2012-10-19 DIAGNOSIS — M79606 Pain in leg, unspecified: Secondary | ICD-10-CM

## 2012-10-28 ENCOUNTER — Telehealth: Payer: Self-pay | Admitting: *Deleted

## 2012-10-28 NOTE — Telephone Encounter (Signed)
[name] called to advise that the patient has a foul odor coming from her vaginal area and they need her to be seen. She described it as "rotten eggs". Gave her 11/02/12 to see Dr Daiva Eves.

## 2012-10-31 ENCOUNTER — Telehealth: Payer: Self-pay | Admitting: *Deleted

## 2012-10-31 NOTE — Telephone Encounter (Signed)
Message copied by Eustace Moore on Mon Oct 31, 2012  9:35 AM ------      Message from: Meredeth Ide, PAMELA J      Created: Fri Oct 28, 2012  9:39 AM                   ----- Message -----         From: Rollene Rotunda, MD         Sent: 10/27/2012   7:46 PM           To: Rocco Serene, RN            She might have PVD as an explanation for her leg pain.  I will set her up to see Dr. Kirke Corin.  I called the nursing home where the patient lives to give results.to the patient.  We will need to arrange the appt.

## 2012-10-31 NOTE — Telephone Encounter (Signed)
Called and informed [name] at [facility] about appointment in the morning with Dr. Kirke Corin.

## 2012-11-01 ENCOUNTER — Encounter: Payer: Self-pay | Admitting: Cardiovascular Disease

## 2012-11-01 ENCOUNTER — Ambulatory Visit (INDEPENDENT_AMBULATORY_CARE_PROVIDER_SITE_OTHER): Payer: Medicaid Other | Admitting: Cardiovascular Disease

## 2012-11-01 VITALS — BP 131/86 | HR 92 | Ht 64.0 in | Wt 161.0 lb

## 2012-11-01 DIAGNOSIS — I7389 Other specified peripheral vascular diseases: Secondary | ICD-10-CM

## 2012-11-01 NOTE — Assessment & Plan Note (Addendum)
The patient has known history of peripheral arterial disease with previous angiogram in 2009 showed diffuse tibial disease. However, her current symptoms do not seem to be due to this. Most of her current symptoms are suggestive of peripheral neuropathy which is likely related to diabetes and possibly her HIV medications. Her symptoms are not typical of claudication and although the ABI is mildly reduced, it's actually unchanged from prior ABI. Thus, I recommend evaluation for causes of neuropathy. She has a followup appointment with her primary care physician Dr. Daiva Eves tomorrow. It might be worth to check vitamin B12 and folate levels. She is already on Lyrica but a different medication might need to be considered.

## 2012-11-01 NOTE — Progress Notes (Signed)
HPI This is a 56 year old female who was referred by Dr. Antoine Poche for evaluation of foot pain and peripheral arterial disease. The patient has no prior cardiac history. She was seen recently for tachycardia. Holter monitor demonstrated sinus tachycardia but no sustained other dysrhythmia. Echocardiogram demonstrated a mildly reduced ejection fraction of 45% with global hypokinesis without significant valvular abnormalities. She has previous history of polysubstance abuse. She has HIV infection which is currently under control with treatment. She has prolonged history of diabetes. She has known history of peripheral arterial disease mainly below the knee tibial disease. In 2009, she underwent abdominal aortogram and lower extremity runoff which showed no significant aortoiliac or SFA disease. There was diffuse tibial disease bilaterally with three-vessel runoff. Her ABI was mildly reduced at 0.7 bilaterally at that time. Recently, she started having foot discomfort described as "pins and needles"at rest and at night. There is discomfort with any pressure on the feet. She denies any calf discomfort with walking.  No Known Allergies  Current Outpatient Prescriptions  Medication Sig Dispense Refill  . aspirin 81 MG EC tablet Take 1 tablet (81 mg total) by mouth daily.  30 tablet  6  . ATRIPLA 600-200-300 MG per tablet TAKE (1) TABLET BY MOUTH AT BEDTIME.  30 tablet  PRN  . cetirizine (ZYRTEC) 10 MG tablet Take 10 mg by mouth daily.      Marland Kitchen desipramine (NORPRAMIN) 25 MG tablet Take 25 mg by mouth at bedtime.      . docusate sodium (COLACE) 100 MG capsule Take 100 mg by mouth every other day.       . ferrous sulfate 325 (65 FE) MG tablet TAKE ONE TABLET BY MOUTH ONCE DAILY.  30 tablet  6  . glucose blood (ACCU-CHEK COMPACT TEST DRUM) test strip Use to check blood sugar before each meal, at bedtime and when patient has symptoms of low blood sugar  153 each  12  . HYDROcodone-acetaminophen (NORCO) 5-325 MG  per tablet Take 1 tablet by mouth every 6 (six) hours as needed. pain       . insulin aspart (NOVOLOG) 100 UNIT/ML injection Inject 10 Units into the skin 3 (three) times daily before meals. Inject 10 units plus 1 unit for every 30 MG/DL above 119 MG/DL subcutaneously at meals.      . insulin glargine (LANTUS) 100 UNIT/ML injection Inject 52 Units into the skin at bedtime.       . Insulin Pen Needle 31G X 8 MM MISC Use to inject insulin at meal time and SSI  100 each  3  . Lancet Devices (ACCU-CHEK SOFTCLIX) lancets Use to check blood sugar before each meal, at bedtime and when patient has symptoms of low blood sugar   200 each  11  . metoprolol succinate (TOPROL-XL) 50 MG 24 hr tablet Take 1 tablet (50 mg total) by mouth daily. Take with or immediately following a meal.  30 tablet  4  . MOBIC 7.5 MG tablet TAKE 1 TABLET ONCE DAILY WITH FOOD.  30 tablet  1  . omeprazole (PRILOSEC) 20 MG capsule Take 1 capsule (20 mg total) by mouth daily.  30 capsule  6  . Pancrelipase, Lip-Prot-Amyl, (CREON) 6000 UNITS CPEP Take 6,000-12,000 Units by mouth 3 (three) times daily. Patient takes 2 capsules three times daily with meals and 1 capsule three times daily with snacks       . polyethylene glycol (MIRALAX) powder Take 17 g by mouth as needed. constipation      .  pregabalin (LYRICA) 100 MG capsule Take 100 mg by mouth 3 (three) times daily.      Marland Kitchen REMERON 15 MG tablet TAKE (1) TABLET BY MOUTH AT BEDTIME.  30 each  2  . traZODone (DESYREL) 100 MG tablet Take 1 tablet (100 mg total) by mouth at bedtime.  30 tablet  3  . [DISCONTINUED] NORPRAMIN 25 MG tablet TAKE (1) TABLET BY MOUTH AT BEDTIME.  30 each  2    Past Medical History  Diagnosis Date  . Depression   . Diabetes mellitus   . Neuropathy   . Mood disorder   . Hepatitis C   . TIA (transient ischemic attack)   . Pancreatitis chronic   . Chronic pain   . HIV (human immunodeficiency virus infection)   . Polysubstance abuse   . GERD  (gastroesophageal reflux disease)   . Cardiomyopathy, nonischemic     EF 45%    Past Surgical History  Procedure Date  . None      PHYSICAL EXAM BP 131/86  Pulse 92  Ht 5\' 4"  (1.626 m)  Wt 161 lb (73.029 kg)  BMI 27.64 kg/m2 GENERAL:  Well appearing HEENT:  Pupils equal round and reactive, fundi not visualized, oral mucosa unremarkable, poor dentition. NECK:  No jugular venous distention, waveform within normal limits, carotid upstroke brisk and symmetric, right bruit, no thyromegaly LUNGS:  Clear to auscultation bilaterally BACK:  No CVA tenderness HEART:  PMI not displaced or sustained,S1 and S2 within normal limits, no S3, no S4, no clicks, no rubs, no murmurs ABD:  Flat, positive bowel sounds normal in frequency in pitch, no bruits, no rebound, no guarding, no midline pulsatile mass, no hepatomegaly, no splenomegaly, mildly distended with mild diffuse tenderness to deep palpation EXT:  2 plus pulses upper, diminished dorsalis pedis and posterior tibialis bilaterally, no edema, no cyanosis no clubbing NEURO:  Cranial nerves II through XII grossly intact, motor grossly intact throughout Vascular: Femoral pulses are normal. Distal pulses are not palpable.

## 2012-11-01 NOTE — Patient Instructions (Addendum)
Keep your appointment with primary care physician tomorrow at 11 am. I will send him a note.

## 2012-11-02 ENCOUNTER — Encounter: Payer: Self-pay | Admitting: Infectious Disease

## 2012-11-02 ENCOUNTER — Ambulatory Visit (INDEPENDENT_AMBULATORY_CARE_PROVIDER_SITE_OTHER): Payer: Medicaid Other | Admitting: Infectious Disease

## 2012-11-02 VITALS — BP 132/86 | HR 88 | Temp 97.5°F | Ht 64.0 in | Wt 162.0 lb

## 2012-11-02 DIAGNOSIS — G629 Polyneuropathy, unspecified: Secondary | ICD-10-CM | POA: Insufficient documentation

## 2012-11-02 DIAGNOSIS — G589 Mononeuropathy, unspecified: Secondary | ICD-10-CM

## 2012-11-02 DIAGNOSIS — N760 Acute vaginitis: Secondary | ICD-10-CM

## 2012-11-02 DIAGNOSIS — A499 Bacterial infection, unspecified: Secondary | ICD-10-CM

## 2012-11-02 DIAGNOSIS — G8929 Other chronic pain: Secondary | ICD-10-CM

## 2012-11-02 DIAGNOSIS — B20 Human immunodeficiency virus [HIV] disease: Secondary | ICD-10-CM

## 2012-11-02 DIAGNOSIS — B9689 Other specified bacterial agents as the cause of diseases classified elsewhere: Secondary | ICD-10-CM | POA: Insufficient documentation

## 2012-11-02 DIAGNOSIS — E109 Type 1 diabetes mellitus without complications: Secondary | ICD-10-CM

## 2012-11-02 LAB — HEMOGLOBIN A1C
Hgb A1c MFr Bld: 6.7 % — ABNORMAL HIGH (ref <5.7)
Mean Plasma Glucose: 146 mg/dL — ABNORMAL HIGH (ref <117)

## 2012-11-02 LAB — VITAMIN B12: Vitamin B-12: 592 pg/mL (ref 211–911)

## 2012-11-02 MED ORDER — METRONIDAZOLE 500 MG PO TABS: 500.0000 mg | ORAL_TABLET | Freq: Two times a day (BID) | ORAL | Status: DC

## 2012-11-02 NOTE — Patient Instructions (Addendum)
Please reschedule for 6 months followup (pt schedule d in March, April currently)

## 2012-11-02 NOTE — Progress Notes (Signed)
Subjective:    Patient ID: [name], female    DOB: [birth year], 56 y.o.   MRN: [id number]  HPI  a 56 y.o. female who is doing superbly well on her antiviral regimen, atripla with undetectable viral load and health cd4 count. She was worked in the clinic today do to the fact that one of the aides in her facility had noticed a foul-smelling material from her vaginal area and had wished the patient be worked up for this. The patient herself denies having vaginal discharge or malodorous material from that area. She is more concerned about paresthesias in her feet in a stocking distribution.  Agreed to workup her paresthesias with repeat viral load of her HIV as well as hepatitis C RNA thyroid function tests, B12 and folate levels as well as a check of her hemoglobin A1c.  She ultimately agreed also to be treated presently for bacterial vaginosis with Flagyl 500 mg twice daily. I spent greater than 45 minutes with the patient including greater than 50% of time in face to face counsel of the patient and in coordination of their care.    Review of Systems  Constitutional: Negative for fever, chills, diaphoresis, activity change, appetite change, fatigue and unexpected weight change.  HENT: Negative for congestion, sore throat, rhinorrhea, sneezing, trouble swallowing and sinus pressure.   Eyes: Negative for photophobia and visual disturbance.  Respiratory: Negative for cough, chest tightness, shortness of breath, wheezing and stridor.   Cardiovascular: Negative for chest pain, palpitations and leg swelling.  Gastrointestinal: Negative for nausea, vomiting, abdominal pain, diarrhea, constipation, blood in stool, abdominal distention and anal bleeding.  Genitourinary: Negative for dysuria, hematuria, flank pain and difficulty urinating.  Musculoskeletal: Negative for myalgias, back pain, joint swelling, arthralgias and gait problem.  Skin: Negative for color change, pallor, rash and wound.    Neurological: Positive for numbness. Negative for dizziness, tremors, weakness and light-headedness.  Hematological: Negative for adenopathy. Does not bruise/bleed easily.  Psychiatric/Behavioral: Negative for behavioral problems, confusion, sleep disturbance, dysphoric mood, decreased concentration and agitation.       Objective:   Physical Exam  Constitutional: She is oriented to person, place, and time. She appears well-developed and well-nourished. No distress.  HENT:  Head: Normocephalic and atraumatic.  Mouth/Throat: Oropharynx is clear and moist. No oropharyngeal exudate.  Eyes: Conjunctivae normal and EOM are normal. Pupils are equal, round, and reactive to light. No scleral icterus.  Neck: Normal range of motion. Neck supple. No JVD present.  Cardiovascular: Normal rate, regular rhythm and normal heart sounds.  Exam reveals no gallop and no friction rub.   No murmur heard. Pulmonary/Chest: Effort normal and breath sounds normal. No respiratory distress. She has no wheezes. She has no rales. She exhibits no tenderness.  Abdominal: She exhibits no distension and no mass. There is no tenderness. There is no rebound and no guarding.  Musculoskeletal: She exhibits no edema and no tenderness.  Lymphadenopathy:    She has no cervical adenopathy.  Neurological: She is alert and oriented to person, place, and time. She exhibits normal muscle tone. Coordination normal.  Skin: Skin is warm and dry. She is not diaphoretic. No erythema. No pallor.  Psychiatric: She has a normal mood and affect. Her behavior is normal. Judgment and thought content normal.          Assessment & Plan:  HIV: Has been perfectly well controlled in the past. We'll check viral load today. Continue Atripla.  Hepatitis C: Check hepatitis C RNA should  also check a genotype.  Neuropathy: Certainly be do to her diabetes mellitus. It is due to her HIV there is not much that point they'll do about this given her  viral load is well controlled and she is on moderate regimen. I will check a B12 and folate as well as thyroid function tests and her hemoglobin A1c.  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 Also check a GC and Chlamydia from urine. She claims not to be sex active at present.

## 2012-11-04 LAB — HIV-1 RNA QUANT-NO REFLEX-BLD
HIV 1 RNA Quant: <20 copies/mL (ref <20)
HIV-1 RNA Quant, Log: <1.3 {Log} (ref <1.30)

## 2012-11-04 LAB — HEPATITIS C RNA QUANTITATIVE
HCV Quantitative Log: 5.61 {Log} — ABNORMAL HIGH (ref <1.18)
HCV Quantitative: 406539 IU/mL — ABNORMAL HIGH (ref <15)

## 2012-11-04 LAB — T-HELPER CELL (CD4) - (RCID CLINIC ONLY)
CD4 % Helper T Cell: 32 % — ABNORMAL LOW (ref 33–55)
CD4 T Cell Abs: 590 uL (ref 400–2700)

## 2012-12-19 ENCOUNTER — Other Ambulatory Visit (HOSPITAL_COMMUNITY): Payer: Self-pay | Admitting: Internal Medicine

## 2012-12-19 DIAGNOSIS — Z1231 Encounter for screening mammogram for malignant neoplasm of breast: Secondary | ICD-10-CM

## 2013-01-04 ENCOUNTER — Ambulatory Visit (HOSPITAL_COMMUNITY)
Admission: RE | Admit: 2013-01-04 | Discharge: 2013-01-04 | Disposition: A | Discharge status: Home / Self Care | Payer: Medicaid Other | Source: Ambulatory Visit | Attending: Internal Medicine | Admitting: Internal Medicine

## 2013-01-04 DIAGNOSIS — Z1231 Encounter for screening mammogram for malignant neoplasm of breast: Secondary | ICD-10-CM | POA: Insufficient documentation

## 2013-01-06 ENCOUNTER — Other Ambulatory Visit: Payer: Self-pay | Admitting: Internal Medicine

## 2013-01-06 DIAGNOSIS — R928 Other abnormal and inconclusive findings on diagnostic imaging of breast: Secondary | ICD-10-CM

## 2013-01-13 ENCOUNTER — Encounter (INDEPENDENT_AMBULATORY_CARE_PROVIDER_SITE_OTHER): Payer: Medicaid Other

## 2013-01-13 DIAGNOSIS — I6529 Occlusion and stenosis of unspecified carotid artery: Secondary | ICD-10-CM

## 2013-01-13 DIAGNOSIS — R0989 Other specified symptoms and signs involving the circulatory and respiratory systems: Secondary | ICD-10-CM

## 2013-01-19 ENCOUNTER — Other Ambulatory Visit: Payer: Medicaid Other

## 2013-01-26 ENCOUNTER — Ambulatory Visit
Admission: RE | Admit: 2013-01-26 | Discharge: 2013-01-26 | Disposition: A | Discharge status: Home / Self Care | Payer: Medicaid Other | Source: Ambulatory Visit | Attending: Internal Medicine | Admitting: Internal Medicine

## 2013-01-26 ENCOUNTER — Ambulatory Visit: Payer: Medicaid Other

## 2013-01-26 DIAGNOSIS — R928 Other abnormal and inconclusive findings on diagnostic imaging of breast: Secondary | ICD-10-CM

## 2013-02-06 ENCOUNTER — Emergency Department (HOSPITAL_COMMUNITY): Payer: Medicaid Other

## 2013-02-06 ENCOUNTER — Emergency Department (HOSPITAL_COMMUNITY)
Admission: EM | Admit: 2013-02-06 | Discharge: 2013-02-06 | Disposition: A | Discharge status: Nursing Home (Includes ICF) | Payer: Medicaid Other | Attending: Emergency Medicine | Admitting: Emergency Medicine

## 2013-02-06 ENCOUNTER — Encounter (HOSPITAL_COMMUNITY): Payer: Self-pay

## 2013-02-06 DIAGNOSIS — Z794 Long term (current) use of insulin: Secondary | ICD-10-CM | POA: Insufficient documentation

## 2013-02-06 DIAGNOSIS — F329 Major depressive disorder, single episode, unspecified: Secondary | ICD-10-CM | POA: Insufficient documentation

## 2013-02-06 DIAGNOSIS — M79672 Pain in left foot: Secondary | ICD-10-CM

## 2013-02-06 DIAGNOSIS — F17200 Nicotine dependence, unspecified, uncomplicated: Secondary | ICD-10-CM | POA: Insufficient documentation

## 2013-02-06 DIAGNOSIS — K861 Other chronic pancreatitis: Secondary | ICD-10-CM | POA: Insufficient documentation

## 2013-02-06 DIAGNOSIS — M79609 Pain in unspecified limb: Secondary | ICD-10-CM | POA: Insufficient documentation

## 2013-02-06 DIAGNOSIS — Z79899 Other long term (current) drug therapy: Secondary | ICD-10-CM | POA: Insufficient documentation

## 2013-02-06 DIAGNOSIS — Z8673 Personal history of transient ischemic attack (TIA), and cerebral infarction without residual deficits: Secondary | ICD-10-CM | POA: Insufficient documentation

## 2013-02-06 DIAGNOSIS — F3289 Other specified depressive episodes: Secondary | ICD-10-CM | POA: Insufficient documentation

## 2013-02-06 DIAGNOSIS — K219 Gastro-esophageal reflux disease without esophagitis: Secondary | ICD-10-CM | POA: Insufficient documentation

## 2013-02-06 DIAGNOSIS — Z21 Asymptomatic human immunodeficiency virus [HIV] infection status: Secondary | ICD-10-CM | POA: Insufficient documentation

## 2013-02-06 DIAGNOSIS — I10 Essential (primary) hypertension: Secondary | ICD-10-CM | POA: Insufficient documentation

## 2013-02-06 DIAGNOSIS — Z7982 Long term (current) use of aspirin: Secondary | ICD-10-CM | POA: Insufficient documentation

## 2013-02-06 DIAGNOSIS — E119 Type 2 diabetes mellitus without complications: Secondary | ICD-10-CM | POA: Insufficient documentation

## 2013-02-06 DIAGNOSIS — Z8679 Personal history of other diseases of the circulatory system: Secondary | ICD-10-CM | POA: Insufficient documentation

## 2013-02-06 DIAGNOSIS — Z8669 Personal history of other diseases of the nervous system and sense organs: Secondary | ICD-10-CM | POA: Insufficient documentation

## 2013-02-06 DIAGNOSIS — Z8619 Personal history of other infectious and parasitic diseases: Secondary | ICD-10-CM | POA: Insufficient documentation

## 2013-02-06 LAB — BASIC METABOLIC PANEL
BUN: 18 mg/dL (ref 6–23)
CO2: 22 mEq/L (ref 19–32)
Chloride: 103 mEq/L (ref 96–112)
Creatinine, Ser: 0.94 mg/dL (ref 0.50–1.10)
GFR calc Af Amer: 77 mL/min — ABNORMAL LOW (ref >90)

## 2013-02-06 LAB — CBC WITH DIFFERENTIAL/PLATELET
Basophils Relative: 1 % (ref 0–1)
Eosinophils Relative: 1 % (ref 0–5)
HCT: 38.2 % (ref 36.0–46.0)
Hemoglobin: 12.7 g/dL (ref 12.0–15.0)
MCHC: 33.2 g/dL (ref 30.0–36.0)
MCV: 92.3 fL (ref 78.0–100.0)
Monocytes Absolute: 0.8 10*3/uL (ref 0.1–1.0)
Monocytes Relative: 11 % (ref 3–12)
Neutro Abs: 4.4 10*3/uL (ref 1.7–7.7)

## 2013-02-06 MED ORDER — HYDROCODONE-ACETAMINOPHEN 5-325 MG PO TABS
2.0000 | ORAL_TABLET | Freq: Once | ORAL | Status: AC
  Administered 2013-02-06: 2 via ORAL
  Filled 2013-02-06: qty 2

## 2013-02-06 MED ORDER — HYDROCODONE-ACETAMINOPHEN 5-325 MG PO TABS: 1.0000 | ORAL_TABLET | Freq: Four times a day (QID) | ORAL | Status: DC | PRN

## 2013-02-06 MED ORDER — SULFAMETHOXAZOLE-TRIMETHOPRIM 800-160 MG PO TABS: 1.0000 | ORAL_TABLET | Freq: Two times a day (BID) | ORAL | Status: DC

## 2013-02-06 NOTE — ED Notes (Signed)
Report called to [facility], [phone]. rn spoke to kim, will send transport to pick pt up.

## 2013-02-06 NOTE — ED Provider Notes (Signed)
History     CSN: [id number]  Arrival date & time 02/06/13  0845   First MD Initiated Contact with Patient 02/06/13 [phone]      Chief Complaint  Patient presents with  . Toe Pain    (Consider location/radiation/quality/duration/timing/severity/associated sxs/prior treatment) Patient is a 57 y.o. female presenting with toe pain. The history is provided by the patient. No language interpreter was used.  Toe Pain This is a new problem. The current episode started in the past 7 days. The problem occurs constantly. The problem has been gradually improving. Pertinent negatives include no fever or swollen glands. The symptoms are aggravated by bending and walking. She has tried nothing for the symptoms. The treatment provided no relief.  Pt reports history of numbness in toes on opposite foot.  Pt saw Dr. Algis Liming Infection disease in November.   Pt reports pain resolved.   Pt reports left 1st,2nd and 3rd toes are stinging and red.   Past Medical History  Diagnosis Date  . Depression   . Diabetes mellitus   . Neuropathy   . Mood disorder   . Hepatitis C   . TIA (transient ischemic attack)   . Pancreatitis chronic   . Chronic pain   . HIV (human immunodeficiency virus infection)   . Polysubstance abuse   . GERD (gastroesophageal reflux disease)   . Cardiomyopathy, nonischemic     EF 45%  . Hypertension     Past Surgical History  Procedure Laterality Date  . None      Family History  Problem Relation Age of Onset  . Coronary artery disease Father 75    History  Substance Use Topics  . Smoking status: Current Every Day Smoker -- 0.30 packs/day for 40 years    Types: Cigarettes  . Smokeless tobacco: Never Used  . Alcohol Use: No    OB History   Grav Para Term Preterm Abortions TAB SAB Ect Mult Living                  Review of Systems  Constitutional: Negative for fever.  All other systems reviewed and are negative.    Allergies  Review of patient's allergies  indicates no known allergies.  Home Medications   Current Outpatient Rx  Name  Route  Sig  Dispense  Refill  . aspirin 81 MG EC tablet   Oral   Take 1 tablet (81 mg total) by mouth daily.   30 tablet   6     Needs to keep scheduled appts.   . ATRIPLA 600-200-300 MG per tablet      TAKE (1) TABLET BY MOUTH AT BEDTIME.   30 tablet   PRN   . cetirizine (ZYRTEC) 10 MG tablet   Oral   Take 10 mg by mouth daily.         Marland Kitchen desipramine (NORPRAMIN) 25 MG tablet   Oral   Take 25 mg by mouth at bedtime.         . docusate sodium (COLACE) 100 MG capsule   Oral   Take 100 mg by mouth every other day.          . ferrous sulfate 325 (65 FE) MG tablet      TAKE ONE TABLET BY MOUTH ONCE DAILY.   30 tablet   6   . glucose blood (ACCU-CHEK COMPACT TEST DRUM) test strip      Use to check blood sugar before each meal, at bedtime and when patient  has symptoms of low blood sugar   153 each   12   . HYDROcodone-acetaminophen (NORCO) 5-325 MG per tablet   Oral   Take 1 tablet by mouth every 6 (six) hours as needed. pain          . insulin aspart (NOVOLOG) 100 UNIT/ML injection   Subcutaneous   Inject 10 Units into the skin 3 (three) times daily before meals. Inject 10 units plus 1 unit for every 30 MG/DL above 161 MG/DL subcutaneously at meals.         . insulin glargine (LANTUS) 100 UNIT/ML injection   Subcutaneous   Inject 52 Units into the skin at bedtime.          . Insulin Pen Needle 31G X 8 MM MISC      Use to inject insulin at meal time and SSI   100 each   3   . Lancet Devices (ACCU-CHEK SOFTCLIX) lancets      Use to check blood sugar before each meal, at bedtime and when patient has symptoms of low blood sugar    200 each   11   . metoprolol succinate (TOPROL-XL) 50 MG 24 hr tablet   Oral   Take 1 tablet (50 mg total) by mouth daily. Take with or immediately following a meal.   30 tablet   4     New   . metroNIDAZOLE (FLAGYL) 500 MG tablet    Oral   Take 1 tablet (500 mg total) by mouth 2 (two) times daily.   14 tablet   0   . MOBIC 7.5 MG tablet      TAKE 1 TABLET ONCE DAILY WITH FOOD.   30 tablet   1   . omeprazole (PRILOSEC) 20 MG capsule   Oral   Take 1 capsule (20 mg total) by mouth daily.   30 capsule   6     Needs to keep scheduled appts.   . Pancrelipase, Lip-Prot-Amyl, (CREON) 6000 UNITS CPEP   Oral   Take 6,000-12,000 Units by mouth 3 (three) times daily. Patient takes 2 capsules three times daily with meals and 1 capsule three times daily with snacks          . polyethylene glycol (MIRALAX) powder   Oral   Take 17 g by mouth as needed. constipation         . pregabalin (LYRICA) 100 MG capsule   Oral   Take 100 mg by mouth 3 (three) times daily.         Marland Kitchen REMERON 15 MG tablet      TAKE (1) TABLET BY MOUTH AT BEDTIME.   30 each   2   . traZODone (DESYREL) 100 MG tablet   Oral   Take 1 tablet (100 mg total) by mouth at bedtime.   30 tablet   3     There were no vitals taken for this visit.  Physical Exam  Nursing note and vitals reviewed. Constitutional: She is oriented to person, place, and time.  HENT:  Head: Normocephalic and atraumatic.  Eyes: Pupils are equal, round, and reactive to light.  Neck: Normal range of motion.  Cardiovascular: Normal rate and normal heart sounds.   Pulmonary/Chest: Breath sounds normal.  Musculoskeletal: She exhibits tenderness.  Slight redness 1st,2nd and 3rd toe,   I do not appreciate swelling,  nv intact and ns intact pain to touch,  Not hot,    Neurological: She is alert and oriented  to person, place, and time. She has normal reflexes.  Skin: Skin is warm and dry.  Psychiatric: She has a normal mood and affect.    ED Course  Procedures (including critical care time)  Labs Reviewed - No data to display No results found.   No diagnosis found.    MDM  Pt could have gout.   Possible just pain from neuropathy.   I doubt infection byt I  will cover with antibiotic.   Pt needs to be rechecked by her MD in 2 days        Lonia Skinner [place], [place] 02/06/13 1012

## 2013-02-06 NOTE — ED Notes (Signed)
Pt reports left great toe and 2nrd/3rd toe pain and swelling for last several days, denies any known injury.  Had the same on the right foot in the past --"but went away"

## 2013-02-06 NOTE — ED Notes (Signed)
Pt arrived to er via caregiver with complaints of throbbing pain of her left four toes. Toes appear to be red and swollen. Pt states it is tender to the touch. Pain rated 10/10. Denies recent injury of toes or foot.

## 2013-02-07 NOTE — ED Provider Notes (Signed)
Medical screening examination/treatment/procedure(s) were performed by non-physician practitioner and as supervising physician I was immediately available for consultation/collaboration.   Kathleen M McManus, DO 02/07/13 1314 

## 2013-02-13 ENCOUNTER — Other Ambulatory Visit (INDEPENDENT_AMBULATORY_CARE_PROVIDER_SITE_OTHER): Payer: Medicaid Other

## 2013-02-13 ENCOUNTER — Telehealth: Payer: Self-pay | Admitting: *Deleted

## 2013-02-13 DIAGNOSIS — B20 Human immunodeficiency virus [HIV] disease: Secondary | ICD-10-CM

## 2013-02-13 LAB — CBC WITH DIFFERENTIAL/PLATELET
Basophils Absolute: 0.1 10*3/uL (ref 0.0–0.1)
Basophils Relative: 1 % (ref 0–1)
MCHC: 33.6 g/dL (ref 30.0–36.0)
Neutro Abs: 5.8 10*3/uL (ref 1.7–7.7)
Neutrophils Relative %: 64 % (ref 43–77)
RDW: 15.3 % (ref 11.5–15.5)

## 2013-02-13 NOTE — Telephone Encounter (Signed)
Patient in for labs, stating that she was told to follow up with her doctor after her ED visit for painful, swollen, red toes on her left foot.  Patient was given an rx for septra in ED.  Given appointment with Dr. Daiva Eves for 02/14/13 at 2:15. Andree Coss, RN

## 2013-02-14 ENCOUNTER — Encounter: Payer: Self-pay | Admitting: Infectious Disease

## 2013-02-14 ENCOUNTER — Other Ambulatory Visit (HOSPITAL_COMMUNITY): Payer: Self-pay | Admitting: General Practice

## 2013-02-14 ENCOUNTER — Ambulatory Visit (INDEPENDENT_AMBULATORY_CARE_PROVIDER_SITE_OTHER): Payer: Medicaid Other | Admitting: Infectious Disease

## 2013-02-14 ENCOUNTER — Telehealth: Payer: Self-pay

## 2013-02-14 VITALS — BP 113/67 | HR 99 | Temp 97.4°F | Wt 162.0 lb

## 2013-02-14 DIAGNOSIS — M25572 Pain in left ankle and joints of left foot: Secondary | ICD-10-CM

## 2013-02-14 DIAGNOSIS — I739 Peripheral vascular disease, unspecified: Secondary | ICD-10-CM

## 2013-02-14 DIAGNOSIS — M109 Gout, unspecified: Secondary | ICD-10-CM

## 2013-02-14 LAB — COMPLETE METABOLIC PANEL WITH GFR
Albumin: 3.5 g/dL (ref 3.5–5.2)
CO2: 18 mEq/L — ABNORMAL LOW (ref 19–32)
GFR, Est African American: 53 mL/min — ABNORMAL LOW
GFR, Est Non African American: 46 mL/min — ABNORMAL LOW
Glucose, Bld: 140 mg/dL — ABNORMAL HIGH (ref 70–99)
Potassium: 4.8 mEq/L (ref 3.5–5.3)
Sodium: 135 mEq/L (ref 135–145)
Total Protein: 7.6 g/dL (ref 6.0–8.3)

## 2013-02-14 LAB — LIPID PANEL
Cholesterol: 138 mg/dL (ref 0–200)
LDL Cholesterol: 68 mg/dL (ref 0–99)
Triglycerides: 121 mg/dL (ref <150)

## 2013-02-14 LAB — RPR TITER: RPR Titer: 1:2 {titer}

## 2013-02-14 MED ORDER — COLCHICINE 0.6 MG PO TABS: ORAL_TABLET | ORAL | Status: DC

## 2013-02-14 NOTE — Telephone Encounter (Signed)
Dr Daiva Eves states he called the pharmacy. 5:15 pm Tomasita Morrow, RN

## 2013-02-14 NOTE — Telephone Encounter (Addendum)
Pharmacy calling regarding dose of colchicine 0.6 mg script written.   They have concerns regarding the dose and need clarification. The starting dose seems to be heavy and there are questions regarding quantity.   [facility]   [phone]

## 2013-02-14 NOTE — Progress Notes (Signed)
Subjective:    Patient ID: [name], female    DOB: 22-Nov-1956, 57 y.o.   MRN: [id number]  Foot Pain Associated symptoms include joint swelling. Pertinent negatives include no abdominal pain, arthralgias, chest pain, chills, congestion, coughing, diaphoresis, fatigue, fever, myalgias, nausea, rash, sore throat, vomiting or weakness.    [name] is a 57 y.o. female with HIV infection who is doing superbly well on her  antiviral regimen, atripla with undetectable viral load and health cd4 count who presents to clinic today acutely with complaints of severe left foot and toe pain. She been seen in emergent department there they had thought that she might be suffering from gout but where they had prescribed her Bactrim possible cellulitis. Bactrim is made no impact whatsoever on her pain or swelling. Her pain in her left foot in particular is so severe that she has difficulty wearing shoes at all And prefers to walk either in slippers or barefoot across her floor in a skilled nursing facility.  Upon exam she has exquisitely tender toes with tenderness in all digits especially the first and second and third. She also has exquisite tenderness of the foot itself and the heel. He has darkening years edema of her distal toes on the left as well the right. Right foot has exquisite tenderness in third fourth and fifth digits.  I explained to her that my thought was that we are either dealing with a gout flare in (which seems more likely) versus a more significant underlying infection.  We decided ultimately to proceed with treatment for gout with culture seen and have the patient followup with me next week. I think she should refrain from shoes or other things that will exacerbate her extreme tenderness to palpation.   Review of Systems  Constitutional: Negative for fever, chills, diaphoresis, activity change, appetite change, fatigue and unexpected weight change.  HENT: Negative for congestion,  sore throat, rhinorrhea, sneezing, trouble swallowing and sinus pressure.   Eyes: Negative for photophobia and visual disturbance.  Respiratory: Negative for cough, chest tightness, shortness of breath, wheezing and stridor.   Cardiovascular: Negative for chest pain, palpitations and leg swelling.  Gastrointestinal: Negative for nausea, vomiting, abdominal pain, diarrhea, constipation, blood in stool, abdominal distention and anal bleeding.  Genitourinary: Negative for dysuria, hematuria, flank pain and difficulty urinating.  Musculoskeletal: Positive for joint swelling and gait problem. Negative for myalgias, back pain and arthralgias.  Skin: Positive for color change. Negative for pallor, rash and wound.  Neurological: Negative for dizziness, tremors, weakness and light-headedness.  Hematological: Negative for adenopathy. Does not bruise/bleed easily.  Psychiatric/Behavioral: Negative for behavioral problems, confusion, sleep disturbance, dysphoric mood, decreased concentration and agitation.       Objective:   Physical Exam  Constitutional: She is oriented to person, place, and time. She appears well-developed and well-nourished. No distress.  HENT:  Head: Normocephalic and atraumatic.  Mouth/Throat: Oropharynx is clear and moist. No oropharyngeal exudate.  Eyes: Conjunctivae and EOM are normal. Pupils are equal, round, and reactive to light. No scleral icterus.  Neck: Normal range of motion. Neck supple. No JVD present.  Cardiovascular: Normal rate, regular rhythm and normal heart sounds.  Exam reveals no gallop and no friction rub.   No murmur heard. Pulmonary/Chest: Effort normal and breath sounds normal. No respiratory distress. She has no wheezes. She has no rales. She exhibits no tenderness.  Abdominal: She exhibits no distension and no mass. There is no tenderness. There is no rebound and no guarding.  Musculoskeletal: She exhibits tenderness. She exhibits no edema.       Left  ankle: She exhibits swelling. She exhibits normal range of motion, no ecchymosis and no deformity. Tenderness.       Feet:  Lymphadenopathy:    She has no cervical adenopathy.  Neurological: She is alert and oriented to person, place, and time. She has normal reflexes. She exhibits normal muscle tone. Coordination normal.  Skin: Skin is warm and dry. She is not diaphoretic. There is erythema. No pallor.  Psychiatric: She has a normal mood and affect. Her behavior is normal. Judgment and thought content normal.          Assessment & Plan:   multiple family painful toes on the left greater than right as well as swollen and painful left ankle : Differential diagnoses does certainly include gallop or septic arthritis  I would flavor the former diagnosis. For now we'll proceed with colchicine therapy. Did not go with corticosteroids due to the risk of causing possible undiagnosed infection to progress and also making her diabetes worsen. We'll see her back next week and reassess her. Certainly should she worsen in the interval or not have responded appropriately I will evaluate her further and possibly admitted to the hospital for more urgent imaging with an MRI and also aspiration of joint for fluid crystals, wbc and culture. She does have discloraiton of toes which raises ? Of PVD or even PAD but acuity of ssx does not fit very well for those unless she had thrombosis acutely which is NOT apparenty  HIV: perfectly controlled  DM: IDDM, ssee above re why I did not pursue steroids

## 2013-02-16 LAB — T.PALLIDUM AB, TOTAL: T pallidum Antibodies (TP-PA): >8 S/CO — ABNORMAL HIGH (ref <0.90)

## 2013-02-20 ENCOUNTER — Inpatient Hospital Stay (HOSPITAL_COMMUNITY): Payer: Medicaid Other

## 2013-02-20 ENCOUNTER — Inpatient Hospital Stay (HOSPITAL_COMMUNITY)
Admission: AD | Admit: 2013-02-20 | Discharge: 2013-02-23 | DRG: 253 | Disposition: A | Discharge status: Skilled Nursing Facility | Payer: Medicaid Other | Source: Ambulatory Visit | Attending: Internal Medicine | Admitting: Internal Medicine

## 2013-02-20 ENCOUNTER — Other Ambulatory Visit (HOSPITAL_COMMUNITY): Payer: Self-pay | Admitting: General Practice

## 2013-02-20 ENCOUNTER — Ambulatory Visit (HOSPITAL_COMMUNITY): Payer: Medicaid Other

## 2013-02-20 ENCOUNTER — Encounter: Payer: Self-pay | Admitting: Infectious Disease

## 2013-02-20 ENCOUNTER — Ambulatory Visit (INDEPENDENT_AMBULATORY_CARE_PROVIDER_SITE_OTHER): Payer: Medicaid Other | Admitting: Infectious Disease

## 2013-02-20 ENCOUNTER — Ambulatory Visit (HOSPITAL_COMMUNITY)
Admission: RE | Admit: 2013-02-20 | Discharge: 2013-02-20 | Disposition: A | Discharge status: Home / Self Care | Payer: Medicaid Other | Source: Ambulatory Visit | Attending: General Practice | Admitting: General Practice

## 2013-02-20 ENCOUNTER — Encounter (HOSPITAL_COMMUNITY): Payer: Self-pay | Admitting: General Practice

## 2013-02-20 VITALS — BP 156/93 | HR 93 | Temp 98.3°F | Wt 160.0 lb

## 2013-02-20 DIAGNOSIS — R0989 Other specified symptoms and signs involving the circulatory and respiratory systems: Secondary | ICD-10-CM

## 2013-02-20 DIAGNOSIS — M79609 Pain in unspecified limb: Secondary | ICD-10-CM

## 2013-02-20 DIAGNOSIS — E119 Type 2 diabetes mellitus without complications: Secondary | ICD-10-CM

## 2013-02-20 DIAGNOSIS — B1920 Unspecified viral hepatitis C without hepatic coma: Secondary | ICD-10-CM | POA: Diagnosis present

## 2013-02-20 DIAGNOSIS — J45909 Unspecified asthma, uncomplicated: Secondary | ICD-10-CM | POA: Diagnosis present

## 2013-02-20 DIAGNOSIS — I739 Peripheral vascular disease, unspecified: Secondary | ICD-10-CM

## 2013-02-20 DIAGNOSIS — F17200 Nicotine dependence, unspecified, uncomplicated: Secondary | ICD-10-CM | POA: Diagnosis present

## 2013-02-20 DIAGNOSIS — M79672 Pain in left foot: Secondary | ICD-10-CM | POA: Diagnosis present

## 2013-02-20 DIAGNOSIS — B20 Human immunodeficiency virus [HIV] disease: Secondary | ICD-10-CM

## 2013-02-20 DIAGNOSIS — I70219 Atherosclerosis of native arteries of extremities with intermittent claudication, unspecified extremity: Principal | ICD-10-CM | POA: Diagnosis present

## 2013-02-20 DIAGNOSIS — K219 Gastro-esophageal reflux disease without esophagitis: Secondary | ICD-10-CM | POA: Diagnosis present

## 2013-02-20 DIAGNOSIS — N179 Acute kidney failure, unspecified: Secondary | ICD-10-CM | POA: Diagnosis not present

## 2013-02-20 DIAGNOSIS — E1051 Type 1 diabetes mellitus with diabetic peripheral angiopathy without gangrene: Secondary | ICD-10-CM | POA: Diagnosis present

## 2013-02-20 DIAGNOSIS — Z7982 Long term (current) use of aspirin: Secondary | ICD-10-CM

## 2013-02-20 DIAGNOSIS — I708 Atherosclerosis of other arteries: Secondary | ICD-10-CM | POA: Diagnosis present

## 2013-02-20 DIAGNOSIS — I7389 Other specified peripheral vascular diseases: Secondary | ICD-10-CM | POA: Diagnosis present

## 2013-02-20 DIAGNOSIS — Z79899 Other long term (current) drug therapy: Secondary | ICD-10-CM

## 2013-02-20 DIAGNOSIS — F329 Major depressive disorder, single episode, unspecified: Secondary | ICD-10-CM | POA: Diagnosis present

## 2013-02-20 DIAGNOSIS — I428 Other cardiomyopathies: Secondary | ICD-10-CM | POA: Diagnosis present

## 2013-02-20 DIAGNOSIS — Z8673 Personal history of transient ischemic attack (TIA), and cerebral infarction without residual deficits: Secondary | ICD-10-CM

## 2013-02-20 DIAGNOSIS — I10 Essential (primary) hypertension: Secondary | ICD-10-CM | POA: Diagnosis present

## 2013-02-20 DIAGNOSIS — I749 Embolism and thrombosis of unspecified artery: Secondary | ICD-10-CM

## 2013-02-20 DIAGNOSIS — B1710 Acute hepatitis C without hepatic coma: Secondary | ICD-10-CM

## 2013-02-20 DIAGNOSIS — E109 Type 1 diabetes mellitus without complications: Secondary | ICD-10-CM

## 2013-02-20 DIAGNOSIS — Z21 Asymptomatic human immunodeficiency virus [HIV] infection status: Secondary | ICD-10-CM | POA: Diagnosis present

## 2013-02-20 DIAGNOSIS — M129 Arthropathy, unspecified: Secondary | ICD-10-CM | POA: Diagnosis present

## 2013-02-20 DIAGNOSIS — I509 Heart failure, unspecified: Secondary | ICD-10-CM | POA: Diagnosis present

## 2013-02-20 DIAGNOSIS — I2510 Atherosclerotic heart disease of native coronary artery without angina pectoris: Secondary | ICD-10-CM | POA: Diagnosis present

## 2013-02-20 DIAGNOSIS — F3289 Other specified depressive episodes: Secondary | ICD-10-CM | POA: Diagnosis present

## 2013-02-20 HISTORY — DX: Peripheral vascular disease, unspecified: I73.9

## 2013-02-20 HISTORY — DX: Type 2 diabetes mellitus without complications: E11.9

## 2013-02-20 HISTORY — DX: Unspecified asthma, uncomplicated: J45.909

## 2013-02-20 HISTORY — DX: Unspecified osteoarthritis, unspecified site: M19.90

## 2013-02-20 LAB — CBC
MCH: 31 pg (ref 26.0–34.0)
Platelets: 232 10*3/uL (ref 150–400)
RBC: 3.97 MIL/uL (ref 3.87–5.11)
RDW: 14.5 % (ref 11.5–15.5)
WBC: 7.1 10*3/uL (ref 4.0–10.5)

## 2013-02-20 LAB — GLUCOSE, CAPILLARY: Glucose-Capillary: 133 mg/dL — ABNORMAL HIGH (ref 70–99)

## 2013-02-20 LAB — PROTIME-INR
INR: 0.96 (ref 0.00–1.49)
Prothrombin Time: 12.7 seconds (ref 11.6–15.2)

## 2013-02-20 MED ORDER — ACETAMINOPHEN 325 MG PO TABS: 650.0000 mg | ORAL_TABLET | Freq: Four times a day (QID) | ORAL | Status: DC | PRN

## 2013-02-20 MED ORDER — ADULT MULTIVITAMIN W/MINERALS CH
1.0000 | ORAL_TABLET | Freq: Every day | ORAL | Status: DC
  Administered 2013-02-22 – 2013-02-23 (×2): 1 via ORAL
  Filled 2013-02-20 (×3): qty 1

## 2013-02-20 MED ORDER — MIRTAZAPINE 15 MG PO TABS
15.0000 mg | ORAL_TABLET | Freq: Every day | ORAL | Status: DC
  Administered 2013-02-20 – 2013-02-22 (×3): 15 mg via ORAL
  Filled 2013-02-20 (×4): qty 1

## 2013-02-20 MED ORDER — SODIUM CHLORIDE 0.9 % IJ SOLN: 3.0000 mL | INTRAMUSCULAR | Status: DC | PRN

## 2013-02-20 MED ORDER — PREGABALIN 100 MG PO CAPS
100.0000 mg | ORAL_CAPSULE | Freq: Three times a day (TID) | ORAL | Status: DC
  Administered 2013-02-20 – 2013-02-23 (×7): 100 mg via ORAL
  Filled 2013-02-20 (×7): qty 1

## 2013-02-20 MED ORDER — EFAVIRENZ-EMTRICITAB-TENOFOVIR 600-200-300 MG PO TABS
1.0000 | ORAL_TABLET | Freq: Every day | ORAL | Status: DC
  Administered 2013-02-20 – 2013-02-22 (×3): 1 via ORAL
  Filled 2013-02-20 (×4): qty 1

## 2013-02-20 MED ORDER — HYDROCODONE-ACETAMINOPHEN 5-325 MG PO TABS
1.0000 | ORAL_TABLET | Freq: Four times a day (QID) | ORAL | Status: DC | PRN
  Filled 2013-02-20: qty 1

## 2013-02-20 MED ORDER — HEPARIN (PORCINE) IN NACL 100-0.45 UNIT/ML-% IJ SOLN
1100.0000 [IU]/h | INTRAMUSCULAR | Status: DC
  Administered 2013-02-20: 1100 [IU]/h via INTRAVENOUS
  Filled 2013-02-20: qty 250

## 2013-02-20 MED ORDER — SODIUM CHLORIDE 0.9 % IJ SOLN
3.0000 mL | Freq: Two times a day (BID) | INTRAMUSCULAR | Status: DC
  Administered 2013-02-20 – 2013-02-21 (×2): 3 mL via INTRAVENOUS

## 2013-02-20 MED ORDER — PANCRELIPASE (LIP-PROT-AMYL) 12000-38000 UNITS PO CPEP
1.0000 | ORAL_CAPSULE | Freq: Three times a day (TID) | ORAL | Status: DC
  Administered 2013-02-21 – 2013-02-23 (×6): 1 via ORAL
  Filled 2013-02-20 (×11): qty 1

## 2013-02-20 MED ORDER — LORATADINE 10 MG PO TABS
10.0000 mg | ORAL_TABLET | Freq: Every day | ORAL | Status: DC
  Administered 2013-02-22 – 2013-02-23 (×2): 10 mg via ORAL
  Filled 2013-02-20 (×3): qty 1

## 2013-02-20 MED ORDER — INSULIN GLARGINE 100 UNIT/ML ~~LOC~~ SOLN: 22.0000 [IU] | Freq: Every day | SUBCUTANEOUS | Status: DC

## 2013-02-20 MED ORDER — DESIPRAMINE HCL 25 MG PO TABS
25.0000 mg | ORAL_TABLET | Freq: Every day | ORAL | Status: DC
  Administered 2013-02-20 – 2013-02-22 (×3): 25 mg via ORAL
  Filled 2013-02-20 (×4): qty 1

## 2013-02-20 MED ORDER — INSULIN ASPART 100 UNIT/ML ~~LOC~~ SOLN
0.0000 [IU] | Freq: Three times a day (TID) | SUBCUTANEOUS | Status: DC
  Administered 2013-02-22: 2 [IU] via SUBCUTANEOUS
  Administered 2013-02-23: 1 [IU] via SUBCUTANEOUS

## 2013-02-20 MED ORDER — HEPARIN (PORCINE) IN NACL 100-0.45 UNIT/ML-% IJ SOLN
1000.0000 [IU]/h | INTRAMUSCULAR | Status: DC
  Filled 2013-02-20: qty 250

## 2013-02-20 MED ORDER — ACETAMINOPHEN 650 MG RE SUPP: 650.0000 mg | Freq: Four times a day (QID) | RECTAL | Status: DC | PRN

## 2013-02-20 MED ORDER — METOPROLOL SUCCINATE ER 50 MG PO TB24
50.0000 mg | ORAL_TABLET | Freq: Every day | ORAL | Status: DC
  Administered 2013-02-20 – 2013-02-23 (×3): 50 mg via ORAL
  Filled 2013-02-20 (×4): qty 1

## 2013-02-20 MED ORDER — HEPARIN BOLUS VIA INFUSION
4000.0000 [IU] | Freq: Once | INTRAVENOUS | Status: AC
  Administered 2013-02-20: 4000 [IU] via INTRAVENOUS
  Filled 2013-02-20: qty 4000

## 2013-02-20 MED ORDER — INSULIN GLARGINE 100 UNIT/ML ~~LOC~~ SOLN
45.0000 [IU] | Freq: Every day | SUBCUTANEOUS | Status: DC
  Administered 2013-02-20 – 2013-02-21 (×2): 45 [IU] via SUBCUTANEOUS
  Filled 2013-02-20 (×3): qty 0.45

## 2013-02-20 MED ORDER — TRAZODONE HCL 100 MG PO TABS
100.0000 mg | ORAL_TABLET | Freq: Every day | ORAL | Status: DC
  Administered 2013-02-20 – 2013-02-22 (×3): 100 mg via ORAL
  Filled 2013-02-20 (×4): qty 1

## 2013-02-20 MED ORDER — PANTOPRAZOLE SODIUM 40 MG PO TBEC
40.0000 mg | DELAYED_RELEASE_TABLET | Freq: Every day | ORAL | Status: DC
  Administered 2013-02-22 – 2013-02-23 (×2): 40 mg via ORAL
  Filled 2013-02-20 (×2): qty 1

## 2013-02-20 MED ORDER — DOCUSATE SODIUM 100 MG PO CAPS
100.0000 mg | ORAL_CAPSULE | Freq: Every day | ORAL | Status: DC
  Administered 2013-02-20 – 2013-02-23 (×3): 100 mg via ORAL
  Filled 2013-02-20 (×4): qty 1

## 2013-02-20 MED ORDER — HEPARIN SODIUM (PORCINE) 5000 UNIT/ML IJ SOLN: 5000.0000 [IU] | Freq: Three times a day (TID) | INTRAMUSCULAR | Status: DC

## 2013-02-20 MED ORDER — SODIUM CHLORIDE 0.9 % IV SOLN
INTRAVENOUS | Status: DC
  Administered 2013-02-20: 22:00:00 via INTRAVENOUS
  Administered 2013-02-20: 1000 mL via INTRAVENOUS
  Administered 2013-02-22: 16:00:00 via INTRAVENOUS

## 2013-02-20 MED ORDER — HEPARIN (PORCINE) IN NACL 100-0.45 UNIT/ML-% IJ SOLN
1100.0000 [IU]/h | INTRAMUSCULAR | Status: DC
  Filled 2013-02-20: qty 250

## 2013-02-20 MED ORDER — ASPIRIN EC 81 MG PO TBEC
81.0000 mg | DELAYED_RELEASE_TABLET | Freq: Every day | ORAL | Status: DC
  Administered 2013-02-22 – 2013-02-23 (×2): 81 mg via ORAL
  Filled 2013-02-20 (×3): qty 1

## 2013-02-20 MED ORDER — MORPHINE SULFATE 2 MG/ML IJ SOLN
2.0000 mg | INTRAMUSCULAR | Status: DC | PRN
  Administered 2013-02-20 – 2013-02-23 (×6): 2 mg via INTRAVENOUS
  Filled 2013-02-20 (×5): qty 1
  Filled 2013-02-20: qty 2

## 2013-02-20 MED ORDER — INSULIN GLARGINE 100 UNIT/ML ~~LOC~~ SOLN: 45.0000 [IU] | Freq: Every day | SUBCUTANEOUS | Status: DC

## 2013-02-20 MED ORDER — POLYETHYLENE GLYCOL 3350 17 GM/SCOOP PO POWD
17.0000 g | Freq: Every day | ORAL | Status: DC | PRN
  Filled 2013-02-20: qty 255

## 2013-02-20 MED ORDER — ASPIRIN 81 MG PO TBEC: 81.0000 mg | DELAYED_RELEASE_TABLET | Freq: Every day | ORAL | Status: DC

## 2013-02-20 MED ORDER — SODIUM CHLORIDE 0.9 % IV SOLN: 250.0000 mL | INTRAVENOUS | Status: DC | PRN

## 2013-02-20 MED ORDER — FERROUS SULFATE 325 (65 FE) MG PO TABS
325.0000 mg | ORAL_TABLET | Freq: Three times a day (TID) | ORAL | Status: DC
  Administered 2013-02-21 – 2013-02-23 (×6): 325 mg via ORAL
  Filled 2013-02-20 (×10): qty 1

## 2013-02-20 NOTE — H&P (Signed)
Hospital Admission Note Date: 02/20/2013  Patient name: [name] Medical record number: [id number] Date of birth: 1956-02-09 Age: 57 y.o. Gender: female PCP: Acey Lav, MD  Medical Service: Internal Medicine Teaching Service  Attending physician:  Dr. Meredith Pel   1st Contact: Dr. Garald Braver   Pager:[phone] 2nd Contact: Dr. Clyde Lundborg    Pager:[phone] After 5 pm or weekends: 1st Contact:      Pager: [phone] 2nd Contact:      Pager: [phone]  Chief Complaint: Left foot pain  History of Present Illness: [name] is a 57 year old woman resident of [facility], with PMH of HIV infection (last CD4 count of 550 on 02/13/13, undetectable viral load), CHF (last echo 7/13 with EF 45-50%), Hepatitis C, PVD with claudication, DM, and tobacco abuse who comes in from the ID clinic for evaluation of left foot pain. She explains that two weeks ago she had right foot pain, was evaluated at the SNF and treated for presumed gout. Her right foot pain improved but 10 days ago her left foot started hurting. She was seen in the ED, receiving treatment for gout and possible cellulitis with prescription for Bactrim which she took but had no improved of her left foot and toes pain. She was also seen by Dr. Daiva Eves at the ID clinic who examined her and found her left foot to be exquisitely tender. 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 She is being admitted for further diagnostic work up and symptomatic relief.   She denies fever/chills, cough, headache, confusion, shortness of breath, chest pain, abdominal pain, N/V, calf pain, dysuria, hematuria, or diarrhea.   Meds: Medications Prior to Admission  Medication Sig Dispense Refill  . aspirin 81 MG EC tablet Take 1 tablet (81 mg total) by mouth daily.  30 tablet  6  . cetirizine (ZYRTEC) 10 MG tablet Take 10  mg by mouth daily.      Marland Kitchen desipramine (NORPRAMIN) 25 MG tablet Take 25 mg by mouth at bedtime.      . docusate sodium (COLACE) 100 MG capsule Take 100 mg by mouth daily.       Marland Kitchen efavirenz-emtricitabine-tenofovir (ATRIPLA) 600-200-300 MG per tablet Take 1 tablet by mouth at bedtime.       . ferrous sulfate 325 (65 FE) MG tablet TAKE ONE TABLET BY MOUTH ONCE DAILY.  30 tablet  6  . HYDROcodone-acetaminophen (NORCO/VICODIN) 5-325 MG per tablet Take 1 tablet by mouth every 6 (six) hours as needed for pain (left shoulder pain). pain  16 tablet  0  . insulin glargine (LANTUS) 100 UNIT/ML injection Inject 45 Units into the skin at bedtime.       . metoprolol succinate (TOPROL-XL) 50 MG 24 hr tablet Take 1 tablet (50 mg total) by mouth daily. Take with or immediately following a meal.  30 tablet  4  . mirtazapine (REMERON) 15 MG tablet Take 15 mg by mouth at bedtime.      . Multiple Vitamin (MULTIVITAMIN WITH MINERALS) TABS Take 1 tablet by mouth daily.      Marland Kitchen omeprazole (PRILOSEC) 20 MG capsule Take 1 capsule (20 mg total) by mouth daily.  30 capsule  6  . Pancrelipase, Lip-Prot-Amyl, (CREON) 6000 UNITS CPEP Take 12,000 Units by mouth 3 (three) times daily. Patient takes 2 capsules three times daily with meals and 1  capsule three times daily with snacks      . polyethylene glycol (MIRALAX) powder Take 17 g by mouth daily as needed. constipation      . pregabalin (LYRICA) 100 MG capsule Take 100 mg by mouth 3 (three) times daily.      . traZODone (DESYREL) 100 MG tablet Take 1 tablet (100 mg total) by mouth at bedtime.  30 tablet  3  . [DISCONTINUED] colchicine 0.6 MG tablet Take three tablets once then one tablet one hour later and continue this regimen for 5 days then one tablet bid  60 tablet  1  . [DISCONTINUED] glucose blood (ACCU-CHEK COMPACT TEST DRUM) test strip Use to check blood sugar before each meal, at bedtime and when patient has symptoms of low blood sugar  153 each  12  . [DISCONTINUED]  insulin aspart (NOVOLOG) 100 UNIT/ML injection Inject 10 Units into the skin 3 (three) times daily before meals. Inject 10 units plus 1 unit for every 30 MG/DL above 409 MG/DL subcutaneously at meals.      . [DISCONTINUED] Insulin Pen Needle 31G X 8 MM MISC Use to inject insulin at meal time and SSI  100 each  3  . [DISCONTINUED] Lancet Devices (ACCU-CHEK SOFTCLIX) lancets Use to check blood sugar before each meal, at bedtime and when patient has symptoms of low blood sugar   200 each  11  . [DISCONTINUED] meloxicam (MOBIC) 7.5 MG tablet Take 7.5 mg by mouth daily.       Allergies: Allergies as of 02/20/2013  . (No Known Allergies)   Past Medical History  Diagnosis Date  . Depression   . Neuropathy   . Mood disorder   . Hepatitis C   . TIA (transient ischemic attack)   . Pancreatitis chronic   . Chronic pain   . HIV (human immunodeficiency virus infection) 1990's  . Polysubstance abuse   . GERD (gastroesophageal reflux disease)   . Cardiomyopathy, nonischemic     EF 45%  . Hypertension   . Coronary artery disease   . Peripheral vascular disease   . Asthma     "used to when I was young" (02/20/2013)  . Type II diabetes mellitus   . Arthritis     "legs and arms" (02/20/2013)   Past Surgical History  Procedure Laterality Date  . Carotid endarterectomy Right    Family History  Problem Relation Age of Onset  . Coronary artery disease Father 81   History   Social History  . Marital Status: Widowed    Spouse Name: N/A    Number of Children: N/A  . Years of Education: 12th grade   Occupational History  .     Social History Main Topics  . Smoking status: Current Every Day Smoker -- 0.30 packs/day for 40 years    Types: Cigarettes  . Smokeless tobacco: Never Used     Comment: 02/20/2013 offered smoing cessation materials; pt declines  . Alcohol Use: No     Comment: 02/20/2013 "used to have problems w/alcohol; haven't drank anything in 4 yr"  . Drug Use: Yes    Special:  Cocaine     Comment: 02/20/2013 "none in about 4 yr"  . Sexually Active: No     Comment: pt. declined condoms   Other Topics Concern  . Not on file   Social History Narrative   Alcohol use- denies currently    Hx of cocaine use - pt denies current illicit drug use   Lives  in ALF    Review of Systems: Pertinent items are noted in HPI.  Physical Exam: Blood pressure 165/93, pulse 93, temperature 97.4 F (36.3 C), temperature source Oral, height 5' 4.17" (1.63 m), weight 160 lb (72.576 kg). Vitals reviewed. General: resting in bed, in NAD HEENT: no scleral icterus, bilateral carotid bruits Cardiac: RRR, no rubs, murmurs or gallops Pulm: soft crackles at left lower lobe, no wheezes, rales, or rhonchi Abd: soft, nontender, nondistended, BS present Ext: Bilateral extremities cool to the tough with TTP of the toes, L>R. DP 1+ bilaterally. ROM and sensation of bilateral feet intact. Darker discoloration of bilateral dorsal feet (chronic). Bilateral calves non tender with no erythema or palpable cord.  Neuro: alert and oriented X3, cranial nerves II-XII grossly intact, strength and sensation to light touch equal in bilateral upper and lower extremities   Lab results: Basic Metabolic Panel: Pending  Liver Function Tests: Pending  CBC:  Recent Labs  02/20/13 1727  WBC 7.1  HGB 12.3  HCT 35.7*  MCV 89.9  PLT 232   CBG:  Recent Labs  02/20/13 1700  GLUCAP 78   HUrine Drug Screen: Drugs of Abuse     Component Value Date/Time   LABOPIA NONE DETECTED 06/22/2007 0156   COCAINSCRNUR NONE DETECTED 06/22/2007 0156   LABBENZ NONE DETECTED 06/22/2007 0156   AMPHETMU NONE DETECTED 06/22/2007 0156   THCU NONE DETECTED 06/22/2007 0156   LABBARB  Value: NONE DETECTED        DRUG SCREEN FOR MEDICAL PURPOSES ONLY.  IF CONFIRMATION IS NEEDED FOR ANY PURPOSE, NOTIFY LAB WITHIN 5 DAYS. 06/22/2007 0156    Imaging results:  US Arterial Seg Multiple  02/20/2013  *RADIOLOGY REPORT*   Clinical Data: Personal history of tobacco use for 30-40 years, cold toes with discoloration and ulceration.  NONINVASIVE PHYSIOLOGIC VASCULAR STUDY OF BILATERAL LOWER EXTREMITIES  Technique:  Evaluation of both lower extremities was performed at rest, including calculation of ankle-brachial indices, multiple segmental pressure evaluation, segmental Doppler and segmental pulse volume recording.  Comparison:  None.  Findings:  Right ABI: 0.54  Left ABI: 0.65  Right Lower Extremity: In the right lower extremity, there is a significant pressure differential between the popliteal artery and the infrapopliteal vessels distally (143-76 (consistent with infrapopliteal disease.  Additionally the waveforms, from a normal appearance to monophasic wave forms in the infrapopliteal vessels. No digital waveforms or pressures could be obtained.  Left Lower Extremity: In the left lower extremity, there is a significant pressure differential between the popliteal artery and the infrapopliteal vessels distally (123-91) consistent with infrapopliteal disease.  Additionally the waveforms, from a normal appearance to monophasic wave forms in the infrapopliteal vessels. 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 This is slightly worse on the right than the left.  The need for further detailed evaluation by invasive or noninvasive (CTA or MRA) angiography can be determined on a clinical basis.   Original Report Authenticated By: Alcide Clever, M.D.      Assessment & Plan by Problem: 57 year old woman with well controlled HIV but moderate to severe PVD and current smoker presenting from ID clinic, direct admit for evaluation of severe left foot pain.   Left foot pain. She presents with severe left foot pain and tender right foot as well. This is likely secondary to severe PVD which she has had for years and she is a current every day smoker. 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  -Admit to Inpatient -Vascular Surgery consulted, Dr. Arbie Cookey has seen pt with plan for formal arteriogram tomorrow -Morphine PRN for pain -Per Vascular Surgery, started her on heparin drip  -Smoking cessation counseling  PVD with claudication. Likely cause of her left foot pain mentioned above.   AKI. Baseline Creatinine of 0.94, creatinine on admission at 1.3. Etiology unclear, could be prerenal, postrenal, intrarenal.  -IVF NS 34ml/hr -UA  -Strict in and out -urine Na and Cr for FeNa calculation  CHF. 2D echo on 7/13 with EF of 45-50%, mild LVH, diffuse hypokinesia.  -Judicious use of IVF  DM2. Well controlled, last Hgb A1C at 6.7%. She is on Novolog 10 units TID with meals, Lantus 45 units qHS.  -Lantus 45 units qHS -SSI -CBG q4h  HIV. Welll controlled, last CD4 count on 02/1013 at 550 and undetectable VL. She is followed by Dr. Daiva Eves at the ID clinic. She is on Atripla at home and we will continue this medication.   Hep C. Not on treatment. Last HCV quant in 406K.    Depression. Stable. We will continues her home Desipramine.  DVT prophylaxis. On heparin drip  FEN NS @50ml /hr Replete as needed Carb mod diet   Dispo: Disposition is deferred at this time, awaiting improvement of current medical problems. Anticipated discharge in approximately 2-3 day(s).   The patient does have a current PCP Acey Lav, MD), therefore will not be requiring OPC follow-up after discharge.   The patient does not have transportation limitations that hinder transportation to clinic appointments.  SignedKy Barban 02/20/2013, 5:51 PM

## 2013-02-20 NOTE — Progress Notes (Signed)
ANTICOAGULATION CONSULT NOTE - Initial Consult  Pharmacy Consult for heparin Indication: possible arterial thombosis in infrapopliteal artery   No Known Allergies  Patient Measurements: Height: 5' 4.17" (163 cm) Weight: 160 lb (72.576 kg) IBW/kg (Calculated) : 55.1 Heparin Dosing Weight: 70kg  Vital Signs: Temp: 97.4 F (36.3 C) (03/17 1500) Temp src: Oral (03/17 1500) BP: 165/93 mmHg (03/17 1500) Pulse Rate: 93 (03/17 1500)  Labs: No results found for this basename: HGB, HCT, PLT, APTT, LABPROT, INR, HEPARINUNFRC, CREATININE, CKTOTAL, CKMB, TROPONINI,  in the last 72 hours  Estimated Creatinine Clearance: 46.8 ml/min (by C-G formula based on Cr of 1.3).   Medical History: Past Medical History  Diagnosis Date  . Depression   . Diabetes mellitus   . Neuropathy   . Mood disorder   . Hepatitis C   . TIA (transient ischemic attack)   . Pancreatitis chronic   . Chronic pain   . HIV (human immunodeficiency virus infection)   . Polysubstance abuse   . GERD (gastroesophageal reflux disease)   . Cardiomyopathy, nonischemic     EF 45%  . Hypertension    Assessment: 57 year old female with HIV being managed by the Internal medicine and HIV clinic downstairs, found to have exquisitely tender left foot and toes. First thought to be related to gout, no change was seen with the addition of medications. 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 Orders to initiate IV heparin for possible arterial thombosis in infrapopliteal artery.   Baseline coags/labs ordered.  Goal of Therapy:  Heparin level 0.3-0.7 units/ml Monitor platelets by anticoagulation protocol: Yes   Plan:  Give 4000 units bolus x 1 Start heparin infusion at 1100 units/hr Check anti-Xa level in 6 hours and daily while on heparin Continue to monitor H&H and platelets Baseline INR/CBC  Severiano Gilbert 02/20/2013,5:00 PM

## 2013-02-20 NOTE — Progress Notes (Signed)
Subjective:    Patient ID: [name], female    DOB: 1956-05-02, 57 y.o.   MRN: [id number]  Foot Pain Associated symptoms include joint swelling. Pertinent negatives include no abdominal pain, arthralgias, chest pain, chills, congestion, coughing, diaphoresis, fatigue, fever, myalgias, nausea, rash, sore throat, vomiting or weakness.    [name] is a 57 y.o. female with HIV infection who has been superbly well controlled on her  antiviral regimen, atripla with undetectable viral load and health cd4 count who I saw in clinic last week because of acute presentation to the ED with complaints of severe left foot and toe pain. She been seen in emergent department there they had thought that she might be suffering from gout but where they had prescribed her Bactrim possible cellulitis. Bactrim made no impact whatsoever on her pain or swelling. Her pain in her left foot in particular is so severe that she has difficulty wearing shoes at all And preferred to walk either in slippers or barefoot across her floor in a skilled nursing facility. When I examined her she had  exquisitely tender toes with tenderness in all digits especially the first and second and third. She also has exquisite tenderness of the foot itself and the heel. He has darkening years edema of her distal toes on the left as well the right. Right foot has exquisite tenderness in third fourth and fifth digits.  I decided to treat her for gout with colchcine but this made ZERO impact on her ssx and her foot pain and discoloration has worsened in bilateral feet.  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   I am admitting her today for urgent evaluation of her mod to severe infrapopliteal disease which has come on fairly acutely.  I think she needs urgent evaluation by VVS and likely will need CTA of her vessels to see if she can have  stent placed or will require bypass grafting.  We spent greater than 45 minutes with the patient including greater than 50% of time in face to face counsel of the patient and in coordination of their care.    Review of Systems  Constitutional: Negative for fever, chills, diaphoresis, activity change, appetite change, fatigue and unexpected weight change.  HENT: Negative for congestion, sore throat, rhinorrhea, sneezing, trouble swallowing and sinus pressure.   Eyes: Negative for photophobia and visual disturbance.  Respiratory: Negative for cough, chest tightness, shortness of breath, wheezing and stridor.   Cardiovascular: Negative for chest pain, palpitations and leg swelling.  Gastrointestinal: Negative for nausea, vomiting, abdominal pain, diarrhea, constipation, blood in stool, abdominal distention and anal bleeding.  Genitourinary: Negative for dysuria, hematuria, flank pain and difficulty urinating.  Musculoskeletal: Positive for joint swelling and gait problem. Negative for myalgias, back pain and arthralgias.  Skin: Positive for color change. Negative for pallor, rash and wound.  Neurological: Negative for dizziness, tremors, weakness and light-headedness.  Hematological: Negative for adenopathy. Does not bruise/bleed easily.  Psychiatric/Behavioral: Negative for behavioral problems, confusion, sleep disturbance, dysphoric mood, decreased concentration and agitation.       Objective:   Physical Exam  Constitutional: She is oriented to person, place, and time. She appears well-developed and well-nourished. No distress.  HENT:  Head: Normocephalic and atraumatic.  Mouth/Throat: Oropharynx is clear and moist. No oropharyngeal exudate.  Eyes: Conjunctivae and EOM are normal. Pupils are equal, round, and reactive to light. No scleral  icterus.  Neck: Normal range of motion. Neck supple. No JVD present.  Cardiovascular: Normal rate, regular rhythm and normal heart sounds.  Exam  reveals no gallop and no friction rub.   No murmur heard. Pulmonary/Chest: Effort normal and breath sounds normal. No respiratory distress. She has no wheezes. She has no rales. She exhibits no tenderness.  Abdominal: She exhibits no distension and no mass. There is no tenderness. There is no rebound and no guarding.  Musculoskeletal: She exhibits tenderness. She exhibits no edema.       Left ankle: She exhibits swelling. She exhibits normal range of motion, no ecchymosis and no deformity. Tenderness.       Feet:  Lymphadenopathy:    She has no cervical adenopathy.  Neurological: She is alert and oriented to person, place, and time. She has normal reflexes. She exhibits normal muscle tone. Coordination normal.  Skin: Skin is warm and dry. She is not diaphoretic. There is erythema. No pallor.  Psychiatric: She has a normal mood and affect. Her behavior is normal. Judgment and thought content normal.          Assessment & Plan:  Bilateral severe popliteal disease: since onset has been fairly acute I worry about arterial thrombosis or some other acute event. I am admitting her to Internal Medicine service for ugent evaluation by VVS and likely CTA. She will need anticoagulation with IV heparin, pain control. Doubt she has access to cocaine but she had prior hx of use. Might be good idea to tox screen her as well  HIV: perfectly controlled on [place]

## 2013-02-20 NOTE — Progress Notes (Signed)
NURSING PROGRESS NOTE  Tajai Suder [place] [id number] Admission Data: 02/20/2013 5:35 PM Attending Provider: Farley Ly, MD WUJ:[id number] Daiva Eves, MD Code Status: full   Velma Agnes Zeringue is a 57 y.o. female patient admitted from ED:  -No acute distress noted.  -No complaints of shortness of breath.  -No complaints of chest pain.    Blood pressure 165/93, pulse 93, temperature 97.4 F (36.3 C), temperature source Oral, height 5' 4.17" (1.63 m), weight 72.576 kg (160 lb).   Allergies:  Review of patient's allergies indicates no known allergies.  Past Medical History:   has a past medical history of Depression; Neuropathy; Mood disorder; Hepatitis C; TIA (transient ischemic attack); Pancreatitis chronic; Chronic pain; HIV (human immunodeficiency virus infection) (1990's); Polysubstance abuse; GERD (gastroesophageal reflux disease); Cardiomyopathy, nonischemic; Hypertension; Coronary artery disease; Peripheral vascular disease; Asthma; Type II diabetes mellitus; and Arthritis.  Past Surgical History:   has past surgical history that includes Carotid endarterectomy (Right).  Social History:   reports that she has been smoking Cigarettes.  She has a 12 pack-year smoking history. She has never used smokeless tobacco. She reports that she uses illicit drugs (Cocaine). She reports that she does not drink alcohol.  Skin: warm dry and intact. Left foot cool, doppler pluse.  Patient/Family orientated to room. Information packet given to patient/family. Admission inpatient armband information verified with patient/family to include name and date of birth and placed on patient arm. Side rails up x 2, fall assessment and education completed with patient/family. Patient/family able to verbalize understanding of risk associated with falls and verbalized understanding to call for assistance before getting out of bed. Call light within reach. Patient/family able to voice and demonstrate understanding of unit  orientation instructions.    Will continue to evaluate and treat per MD orders.

## 2013-02-20 NOTE — Consult Note (Signed)
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 She has chronic lower surety pain. This has been diagnosed as neuropathy as well as peripheral vascular occlusive disease. She reports this is been progressively worse over the past several weeks to months. She reports initially was worse on the right and then it become worse on the left. She presented to the emergency department for further evaluation. Was transferred to [facility] in the hospital today for further evaluation. She did undergo prior formal catheter-based arteriogram by Dr. Brabham in 2009. This revealed normal aortoiliac segments and normal femoral and popliteal arteries. He did have tibial disease in the with subtotal occlusions on several different vessels. He had been treated nonsurgically. She did have increased difficulty and was seen with noninvasive studies in November of 2013. I reviewed these and this revealed acute evidence of tibial vessel disease. Her ankle arm index at November of 2013 was 0.67 on the right and 0.86 on the left. She underwent noninvasive studies at [facility] this morning revealing a right ankle arm index of 0.54 and left of 0.65.  Past Medical History  Diagnosis Date  . Depression   . Neuropathy   . Mood disorder   . Hepatitis C   . TIA (transient ischemic attack)   . Pancreatitis chronic   . Chronic pain   . HIV (human immunodeficiency virus infection) 1990's  . Polysubstance abuse   . GERD (gastroesophageal reflux disease)   . Cardiomyopathy, nonischemic     EF 45%  . Hypertension   . Coronary artery disease   . Peripheral vascular disease   . Asthma     "used to when I was young" (02/20/2013)  . Type II diabetes mellitus   . Arthritis    "legs and arms" (02/20/2013)    Past Surgical History  Procedure Laterality Date  . Carotid endarterectomy Right     History   Social History  . Marital Status: Widowed    Spouse Name: N/A    Number of Children: N/A  . Years of Education: 12th grade   Occupational History  .     Social History Main Topics  . Smoking status: Current Every Day Smoker -- 0.30 packs/day for 40 years    Types: Cigarettes  . Smokeless tobacco: Never Used     Comment: 02/20/2013 offered smoing cessation materials; pt declines  . Alcohol Use: No     Comment: 02/20/2013 "used to have problems w/alcohol; haven't drank anything in 4 yr"  . Drug Use: Yes    Special: Cocaine     Comment: 02/20/2013 "none in about 4 yr"  . Sexually Active: No     Comment: pt. declined condoms   Other Topics Concern  . Not on file   Social History Narrative   Alcohol use- denies currently    Hx of cocaine use - pt denies current illicit drug use   Lives in ALF    Family History  Problem Relation Age of Onset  . Coronary artery disease Father 70    Allergies as of 02/20/2013  . (No Known Allergies)    No current facility-administered medications on file prior to encounter.   Current Outpatient Prescriptions on File Prior to Encounter  Medication Sig Dispense Refill  .   aspirin 81 MG EC tablet Take 1 tablet (81 mg total) by mouth daily.  30 tablet  6  . cetirizine (ZYRTEC) 10 MG tablet Take 10 mg by mouth daily.      . desipramine (NORPRAMIN) 25 MG tablet Take 25 mg by mouth at bedtime.      . docusate sodium (COLACE) 100 MG capsule Take 100 mg by mouth daily.       . efavirenz-emtricitabine-tenofovir (ATRIPLA) 600-200-300 MG per tablet Take 1 tablet by mouth at bedtime.       . ferrous sulfate 325 (65 FE) MG tablet TAKE ONE TABLET BY MOUTH ONCE DAILY.  30 tablet  6  . HYDROcodone-acetaminophen (NORCO/VICODIN) 5-325 MG per tablet Take 1 tablet by mouth every 6 (six) hours as needed for pain (left shoulder pain).  pain  16 tablet  0  . insulin glargine (LANTUS) 100 UNIT/ML injection Inject 45 Units into the skin at bedtime.       . metoprolol succinate (TOPROL-XL) 50 MG 24 hr tablet Take 1 tablet (50 mg total) by mouth daily. Take with or immediately following a meal.  30 tablet  4  . Multiple Vitamin (MULTIVITAMIN WITH MINERALS) TABS Take 1 tablet by mouth daily.      . omeprazole (PRILOSEC) 20 MG capsule Take 1 capsule (20 mg total) by mouth daily.  30 capsule  6  . Pancrelipase, Lip-Prot-Amyl, (CREON) 6000 UNITS CPEP Take 12,000 Units by mouth 3 (three) times daily. Patient takes 2 capsules three times daily with meals and 1 capsule three times daily with snacks      . polyethylene glycol (MIRALAX) powder Take 17 g by mouth daily as needed. constipation      . pregabalin (LYRICA) 100 MG capsule Take 100 mg by mouth 3 (three) times daily.      . traZODone (DESYREL) 100 MG tablet Take 1 tablet (100 mg total) by mouth at bedtime.  30 tablet  3     REVIEW OF SYSTEMS:  Reviewed in her history and physical with the head  PHYSICAL EXAMINATION:  General: The patient is a well-nourished female, in no acute distress. Vital signs are BP 165/93  Pulse 93  Temp(Src) 97.4 F (36.3 C) (Oral)  Ht 5' 4.17" (1.63 m)  Wt 160 lb (72.576 kg)  BMI 27.32 kg/m2 Pulmonary: There is a good air exchange bilaterally Abdomen: Soft and non-tender. No surgical incisions Musculoskeletal: There are no major deformities.   Neurologic: No focal weakness or paresthesias are detected, Skin: Ulcerations over her great toe and second toe Psychiatric: The patient has normal affect. Cardiovascular: There is a regular rate and rhythm without significant murmur appreciated. Pulse status: 2+ femoral 2+ popliteal pulses bilaterally. Absent popliteal pulses    Impression and Plan:  Tibial vessel disease with increasing rest pain and now with Eathel Pajak tissue loss on her left toes. Will proceed with formal arteriogram tomorrow.  Discussed this with the patient.    Rhodie Cienfuegos [facility] [place] Office: [phone]         

## 2013-02-21 ENCOUNTER — Ambulatory Visit: Payer: Medicaid Other | Admitting: Infectious Disease

## 2013-02-21 ENCOUNTER — Encounter (HOSPITAL_COMMUNITY)
Admission: AD | Disposition: A | Discharge status: Skilled Nursing Facility | Payer: Self-pay | Source: Ambulatory Visit | Attending: Internal Medicine

## 2013-02-21 DIAGNOSIS — I70219 Atherosclerosis of native arteries of extremities with intermittent claudication, unspecified extremity: Secondary | ICD-10-CM

## 2013-02-21 DIAGNOSIS — B20 Human immunodeficiency virus [HIV] disease: Secondary | ICD-10-CM

## 2013-02-21 DIAGNOSIS — I739 Peripheral vascular disease, unspecified: Secondary | ICD-10-CM

## 2013-02-21 DIAGNOSIS — R0989 Other specified symptoms and signs involving the circulatory and respiratory systems: Secondary | ICD-10-CM

## 2013-02-21 DIAGNOSIS — F329 Major depressive disorder, single episode, unspecified: Secondary | ICD-10-CM

## 2013-02-21 HISTORY — PX: ABDOMINAL ANGIOGRAM: SHX5499

## 2013-02-21 HISTORY — PX: LOWER EXTREMITY ANGIOGRAM: SHX5508

## 2013-02-21 LAB — CREATININE, URINE, RANDOM: Creatinine, Urine: 56.13 mg/dL

## 2013-02-21 LAB — BASIC METABOLIC PANEL
BUN: 18 mg/dL (ref 6–23)
CO2: 17 mEq/L — ABNORMAL LOW (ref 19–32)
Calcium: 8.7 mg/dL (ref 8.4–10.5)
Chloride: 108 mEq/L (ref 96–112)
Creatinine, Ser: 0.91 mg/dL (ref 0.50–1.10)
Glucose, Bld: 177 mg/dL — ABNORMAL HIGH (ref 70–99)

## 2013-02-21 LAB — CBC
HCT: 33.6 % — ABNORMAL LOW (ref 36.0–46.0)
Platelets: 218 10*3/uL (ref 150–400)
RDW: 14.5 % (ref 11.5–15.5)
WBC: 7.7 10*3/uL (ref 4.0–10.5)

## 2013-02-21 LAB — RAPID URINE DRUG SCREEN, HOSP PERFORMED
Amphetamines: NOT DETECTED
Barbiturates: NOT DETECTED
Tetrahydrocannabinol: NOT DETECTED

## 2013-02-21 LAB — URINALYSIS, ROUTINE W REFLEX MICROSCOPIC
Bilirubin Urine: NEGATIVE
Hgb urine dipstick: NEGATIVE
Ketones, ur: NEGATIVE mg/dL
Nitrite: NEGATIVE
Urobilinogen, UA: 0.2 mg/dL (ref 0.0–1.0)

## 2013-02-21 LAB — GLUCOSE, CAPILLARY
Glucose-Capillary: 140 mg/dL — ABNORMAL HIGH (ref 70–99)
Glucose-Capillary: 100 mg/dL — ABNORMAL HIGH (ref 70–99)
Glucose-Capillary: 122 mg/dL — ABNORMAL HIGH (ref 70–99)

## 2013-02-21 LAB — HEPARIN LEVEL (UNFRACTIONATED): Heparin Unfractionated: 0.82 IU/mL — ABNORMAL HIGH (ref 0.30–0.70)

## 2013-02-21 LAB — POCT ACTIVATED CLOTTING TIME: Activated Clotting Time: 187 seconds

## 2013-02-21 SURGERY — ANGIOGRAM, LOWER EXTREMITY
Anesthesia: LOCAL

## 2013-02-21 MED ORDER — ONDANSETRON HCL 4 MG/2ML IJ SOLN
4.0000 mg | INTRAMUSCULAR | Status: DC | PRN
  Administered 2013-02-21: 4 mg via INTRAVENOUS
  Filled 2013-02-21: qty 2

## 2013-02-21 MED ORDER — FENTANYL CITRATE 0.05 MG/ML IJ SOLN
INTRAMUSCULAR | Status: AC
  Filled 2013-02-21: qty 2

## 2013-02-21 MED ORDER — ONDANSETRON HCL 4 MG/2ML IJ SOLN: 4.0000 mg | Freq: Four times a day (QID) | INTRAMUSCULAR | Status: DC | PRN

## 2013-02-21 MED ORDER — GUAIFENESIN-DM 100-10 MG/5ML PO SYRP
15.0000 mL | ORAL_SOLUTION | ORAL | Status: DC | PRN
  Filled 2013-02-21: qty 15

## 2013-02-21 MED ORDER — ALUM & MAG HYDROXIDE-SIMETH 200-200-20 MG/5ML PO SUSP: 15.0000 mL | ORAL | Status: DC | PRN

## 2013-02-21 MED ORDER — HEPARIN (PORCINE) IN NACL 100-0.45 UNIT/ML-% IJ SOLN
900.0000 [IU]/h | INTRAMUSCULAR | Status: DC
  Filled 2013-02-21: qty 250

## 2013-02-21 MED ORDER — CHLORHEXIDINE GLUCONATE CLOTH 2 % EX PADS
6.0000 | MEDICATED_PAD | Freq: Every day | CUTANEOUS | Status: DC
  Administered 2013-02-21 – 2013-02-23 (×3): 6 via TOPICAL

## 2013-02-21 MED ORDER — LABETALOL HCL 5 MG/ML IV SOLN
10.0000 mg | INTRAVENOUS | Status: DC | PRN
  Filled 2013-02-21: qty 4

## 2013-02-21 MED ORDER — PHENOL 1.4 % MT LIQD
1.0000 | OROMUCOSAL | Status: DC | PRN
  Filled 2013-02-21: qty 177

## 2013-02-21 MED ORDER — HYDRALAZINE HCL 20 MG/ML IJ SOLN
10.0000 mg | INTRAMUSCULAR | Status: DC | PRN
  Filled 2013-02-21: qty 0.5

## 2013-02-21 MED ORDER — SODIUM CHLORIDE 0.9 % IV SOLN
INTRAVENOUS | Status: DC
  Administered 2013-02-21 – 2013-02-22 (×2): via INTRAVENOUS

## 2013-02-21 MED ORDER — HEPARIN SODIUM (PORCINE) 1000 UNIT/ML IJ SOLN
INTRAMUSCULAR | Status: AC
  Filled 2013-02-21: qty 1

## 2013-02-21 MED ORDER — LIDOCAINE HCL (PF) 1 % IJ SOLN
INTRAMUSCULAR | Status: AC
  Filled 2013-02-21: qty 30

## 2013-02-21 MED ORDER — ACETAMINOPHEN 325 MG PO TABS: 325.0000 mg | ORAL_TABLET | ORAL | Status: DC | PRN

## 2013-02-21 MED ORDER — HEPARIN (PORCINE) IN NACL 100-0.45 UNIT/ML-% IJ SOLN
1000.0000 [IU]/h | INTRAMUSCULAR | Status: DC
  Administered 2013-02-21 – 2013-02-22 (×2): 900 [IU]/h via INTRAVENOUS

## 2013-02-21 MED ORDER — MIDAZOLAM HCL 2 MG/2ML IJ SOLN
INTRAMUSCULAR | Status: AC
  Filled 2013-02-21: qty 2

## 2013-02-21 MED ORDER — METOPROLOL TARTRATE 1 MG/ML IV SOLN: 2.0000 mg | INTRAVENOUS | Status: DC | PRN

## 2013-02-21 MED ORDER — MUPIROCIN 2 % EX OINT
1.0000 "application " | TOPICAL_OINTMENT | Freq: Two times a day (BID) | CUTANEOUS | Status: DC
  Administered 2013-02-21 – 2013-02-23 (×4): 1 via NASAL
  Filled 2013-02-21 (×2): qty 22

## 2013-02-21 MED ORDER — ACETAMINOPHEN 650 MG RE SUPP: 325.0000 mg | RECTAL | Status: DC | PRN

## 2013-02-21 NOTE — Care Management Note (Signed)
    Page 1 of 1   02/23/2013     12:25:02 PM   CARE MANAGEMENT NOTE 02/23/2013  Patient:  [name], [name]   Account Number:  [id number]  Date Initiated:  02/21/2013  Documentation initiated by:  Letha Cape  Subjective/Objective Assessment:   dx left foot pain  admit- From [facility].     Action/Plan:   Anticipated DC Date:  02/23/2013   Anticipated DC Plan:  ASSISTED LIVING / REST HOME  In-house referral  Clinical Social Worker      DC Planning Services  CM consult      Choice offered to / List presented to:             Status of service:  Completed, signed off Medicare Important Message given?   (If response is "NO", the following Medicare IM given date fields will be blank) Date Medicare IM given:   Date Additional Medicare IM given:    Discharge Disposition:  ASSISTED LIVING  Per UR Regulation:  Reviewed for med. necessity/level of care/duration of stay  If discussed at Long Length of Stay Meetings, dates discussed:    Comments:  02/23/13 12:22 Letha Cape RN, BSN [phone] patient is for dc to ALF today, CSW following, she was given scripts for hhpt/ot because they will set this up at alf and also given scripts for 3 n 1 which the facility will also get for patient.  02/21/13 15:26 Letha Cape RN, BSN [phone] patient is from [facility], CSW referral, plan is to return to snf.

## 2013-02-21 NOTE — Progress Notes (Signed)
ANTICOAGULATION CONSULT NOTE - Follow Up Consult  Pharmacy Consult for heparin Indication: vascular disease  Labs:  Recent Labs  02/20/13 1727 02/21/13 0001  HGB 12.3  --   HCT 35.7*  --   PLT 232  --   APTT 31  --   LABPROT 12.7  --   INR 0.96  --   HEPARINUNFRC  --  0.82*    Assessment: 57yo female supratherapeutic on heparin with initial dosing for possible arterial thrombosis, awaiting arteriogram.  Goal of Therapy:  Heparin level 0.3-0.7 units/ml   Plan:  Will decrease heparin gtt by ~1 unit/kg/hr to 1000 units/hr and check level in 6-8hr.  Vernard Gambles, PharmD, BCPS  02/21/2013,12:51 AM

## 2013-02-21 NOTE — Progress Notes (Signed)
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  No Known Allergies  Patient Measurements: Height: 5' 4.17" (163 cm) Weight: 160 lb (72.576 kg) IBW/kg (Calculated) : 55.1 Heparin Dosing Weight: 70 kg  Vital Signs: Temp: 97.4 F (36.3 C) (03/18 0731) Temp src: Oral (03/18 0731) BP: 123/74 mmHg (03/18 0731) Pulse Rate: 86 (03/18 0747)  Labs:  Recent Labs  02/20/13 1727 02/21/13 0001 02/21/13 0540  HGB 12.3  --  11.5*  HCT 35.7*  --  33.6*  PLT 232  --  218  APTT 31  --   --   LABPROT 12.7  --   --   INR 0.96  --   --   HEPARINUNFRC  --  0.82*  --   CREATININE  --   --  0.91    Estimated Creatinine Clearance: 66.9 ml/min (by C-G formula based on Cr of 0.91).      Assessment: 87 YOF s/p LE arteriogram and aortogram to resume IV heparin 4 hours post sheath removal.  Sheath removed around 1315 today - no bleeding or hematoma per documentation.   Goal of Therapy:  HL 0.3 - 0.5 units/mL Monitor platelets by anticoagulation protocol: Yes    Plan:  - At 1730, resume heparin gtt at 900 units/hr, no bolus post arteriogram - Check 6 hr HL - Daily HL / CBC     Gari Hartsell D. Laney Potash, PharmD, BCPS Pager:  [phone] 02/21/2013, 2:05 PM

## 2013-02-21 NOTE — Op Note (Signed)
[facility] [place]  Patient name: [name] MRN: [id number] DOB: 06/20/1956 Sex: female  02/20/2013 - 02/21/2013 Pre-operative Diagnosis: Ischemic left foot Post-operative diagnosis:  Same Surgeon:  Jorge Ny Procedure Performed:  1.  ultrasound-guided access, right femoral artery   2.  abdominal aortogram  3.  bilateral lower extremity runoff  4.  intra-arterial administration of nitroglycerin  5.  additional order catheterization (peroneal artery)  6.  angioplasty, left popliteal artery  7.  angioplasty, left tibial peroneal trunk and peroneal artery   Indications:  The patient was admitted to the hospital yesterday with ischemic changes to her left foot. She has known arterial occlusive disease. She has been study with angiography in 2009 which showed tibial disease. She has never undergone an intervention. She comes in today for further evaluation and possible treatment.  Procedure:  The patient was identified in the holding area and taken to room 8.  The patient was then placed supine on the table and prepped and draped in the usual sterile fashion.  A time out was called.  Ultrasound was used to evaluate the right common femoral artery.  It was patent .  A digital ultrasound image was acquired.  A micropuncture needle was used to access the right common femoral artery under ultrasound guidance.  An 018 wire was advanced without resistance and a micropuncture sheath was placed.  The 018 wire was removed and a benson wire was placed.  The micropuncture sheath was exchanged for a 5 french sheath.  An omniflush catheter was advanced over the wire to the level of L-1.  An abdominal angiogram was obtained.  Next, using the omniflush catheter and a benson wire, the aortic bifurcation was crossed and the catheter was placed into theleft external iliac artery and left runoff was obtained.  right runoff was performed via retrograde sheath injections.  Findings:    Aortogram:  The visualized portions of the suprarenal abdominal aorta showed no significant disease. There is no evidence of renal artery stenosis. The infrarenal abdominal aorta is widely patent. The left common and external iliac artery are widely patent. The right common iliac artery is widely patent. There is a 80% stenosis at the origin of the right hypogastric artery. There is approximately a 30-40% stenosis at the origin of the right external iliac artery.  Right Lower Extremity:  The right common femoral artery is widely patent. The right profunda and superficial femoral arteries are widely patent. The right popliteal artery is patent without significant stenosis. The popliteal artery behind the knee has a high-grade, greater than 90% stenosis. The anterior tibial artery is occluded just distal to its origin with reconstitution at the ankle.  There is greater than 80% stenosis within the tibioperoneal trunk. There is ostial stenosis of the posterior tibial artery. This is the dominant runoff across the ankle. The peroneal artery is also patent at the ankle but somewhat diminutive.  Left Lower Extremity:  The left common femoral, profundofemoral common superficial femoral artery are widely patent. The popliteal artery has a 70% stenosis behind the knee. The tibioperoneal trunk is diffusely diseased. The peroneal artery is the dominant runoff however there is a greater than 80% stenosis in the proximal portion of the artery. It does collateralize to the anterior tibial and posterior tibial artery at the ankle which create the plantar arch.  Intervention:  After the above images were obtained, the decision was made to proceed with intervention. Over an 035 wire, a 6  French sheath was advanced into the left superficial femoral artery. The patient was fully heparinized. I then used a pro-water 014 wire to cross the lesions and get wire access into the distal peroneal artery. A 2.5 x 105 SV balloon was  advanced down into the distal peroneal artery. A contrast injection was performed with the catheter in this location which confirmed that I was in the peroneal artery. I then administered 600 mcg of nitroglycerin into the peroneal artery. Primary balloon angioplasty was then performed of the proximal third of the peroneal artery and the tibioperoneal trunk using the 2.5 x 100 balloon. The balloon was taken to nominal pressure and held for 2 minutes. A completion angiogram was performed which showed a residual stenosis just distal to the balloon. I then advanced the 2.5 mm balloon across this area and repeated the balloon angioplasty at nominal pressure for 2 minutes. Followup angiogram revealed significantly improved results and near correction of the stenosis within the tibioperoneal trunk and peroneal artery. Attention was then turned towards the popliteal stenosis behind the knee. I selected a 4 x 40 Fox SV balloon. This was placed across the lesion. Balloon angioplasty was performed taking the balloon to nominal pressure and holding it up for 2 minutes. A completion arteriogram revealed resolution of the stenosis in the popliteal artery behind the knee. I then withdrew the balloon catheter into the proximal popliteal artery and administered another 400 mcg of nitroglycerin and completion angiography was performed which showed in line flow down to the ankle with collateralization of the peroneal artery out onto the foot. After these results, the decision was made to terminate the procedure. Catheters and wires were removed. The patient taken the holding area for sheath pull once her coagulation profile corrects. There were no complications  Impression:  #1  high-grade stenosis within the popliteal artery behind the knee, tibial peroneal trunk, and peroneal artery. Successful balloon angioplasty was performed. A 4 mm balloon was used in the popliteal artery and a 3 mm balloon was used in the tibioperoneal trunk  and peroneal artery.  #2  40% right external iliac artery stenosis  #3  high-grade stenosis, near occlusion of the right popliteal artery and tibial peroneal trunk. There is an ostial stenosis of the posterior tibial artery which is the dominant runoff. The peroneal artery is small in caliber but patent down to the ankle. The anterior tibial artery is occluded proximally but reconstitutes at the ankle.   Juleen China, M.D. [facility] [place] Office: [phone] Pager:  [phone]

## 2013-02-21 NOTE — H&P (View-Only) (Signed)
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 She has chronic lower surety pain. This has been diagnosed as neuropathy as well as peripheral vascular occlusive disease. She reports this is been progressively worse over the past several weeks to months. She reports initially was worse on the right and then it become worse on the left. She presented to the emergency department for further evaluation. Was transferred to [facility] in the hospital today for further evaluation. She did undergo prior formal catheter-based arteriogram by Dr. Myra Gianotti in 2009. This revealed normal aortoiliac segments and normal femoral and popliteal arteries. He did have tibial disease in the with subtotal occlusions on several different vessels. He had been treated nonsurgically. She did have increased difficulty and was seen with noninvasive studies in November of 2013. I reviewed these and this revealed acute evidence of tibial vessel disease. Her ankle arm index at November of 2013 was 0.67 on the right and 0.86 on the left. She underwent noninvasive studies at [facility] this morning revealing a right ankle arm index of 0.54 and left of 0.65.  Past Medical History  Diagnosis Date  . Depression   . Neuropathy   . Mood disorder   . Hepatitis C   . TIA (transient ischemic attack)   . Pancreatitis chronic   . Chronic pain   . HIV (human immunodeficiency virus infection) 1990's  . Polysubstance abuse   . GERD (gastroesophageal reflux disease)   . Cardiomyopathy, nonischemic     EF 45%  . Hypertension   . Coronary artery disease   . Peripheral vascular disease   . Asthma     "used to when I was young" (02/20/2013)  . Type II diabetes mellitus   . Arthritis    "legs and arms" (02/20/2013)    Past Surgical History  Procedure Laterality Date  . Carotid endarterectomy Right     History   Social History  . Marital Status: Widowed    Spouse Name: N/A    Number of Children: N/A  . Years of Education: 12th grade   Occupational History  .     Social History Main Topics  . Smoking status: Current Every Day Smoker -- 0.30 packs/day for 40 years    Types: Cigarettes  . Smokeless tobacco: Never Used     Comment: 02/20/2013 offered smoing cessation materials; pt declines  . Alcohol Use: No     Comment: 02/20/2013 "used to have problems w/alcohol; haven't drank anything in 4 yr"  . Drug Use: Yes    Special: Cocaine     Comment: 02/20/2013 "none in about 4 yr"  . Sexually Active: No     Comment: pt. declined condoms   Other Topics Concern  . Not on file   Social History Narrative   Alcohol use- denies currently    Hx of cocaine use - pt denies current illicit drug use   Lives in ALF    Family History  Problem Relation Age of Onset  . Coronary artery disease Father 49    Allergies as of 02/20/2013  . (No Known Allergies)    No current facility-administered medications on file prior to encounter.   Current Outpatient Prescriptions on File Prior to Encounter  Medication Sig Dispense Refill  .  aspirin 81 MG EC tablet Take 1 tablet (81 mg total) by mouth daily.  30 tablet  6  . cetirizine (ZYRTEC) 10 MG tablet Take 10 mg by mouth daily.      Marland Kitchen desipramine (NORPRAMIN) 25 MG tablet Take 25 mg by mouth at bedtime.      . docusate sodium (COLACE) 100 MG capsule Take 100 mg by mouth daily.       Marland Kitchen efavirenz-emtricitabine-tenofovir (ATRIPLA) 600-200-300 MG per tablet Take 1 tablet by mouth at bedtime.       . ferrous sulfate 325 (65 FE) MG tablet TAKE ONE TABLET BY MOUTH ONCE DAILY.  30 tablet  6  . HYDROcodone-acetaminophen (NORCO/VICODIN) 5-325 MG per tablet Take 1 tablet by mouth every 6 (six) hours as needed for pain (left shoulder pain).  pain  16 tablet  0  . insulin glargine (LANTUS) 100 UNIT/ML injection Inject 45 Units into the skin at bedtime.       . metoprolol succinate (TOPROL-XL) 50 MG 24 hr tablet Take 1 tablet (50 mg total) by mouth daily. Take with or immediately following a meal.  30 tablet  4  . Multiple Vitamin (MULTIVITAMIN WITH MINERALS) TABS Take 1 tablet by mouth daily.      Marland Kitchen omeprazole (PRILOSEC) 20 MG capsule Take 1 capsule (20 mg total) by mouth daily.  30 capsule  6  . Pancrelipase, Lip-Prot-Amyl, (CREON) 6000 UNITS CPEP Take 12,000 Units by mouth 3 (three) times daily. Patient takes 2 capsules three times daily with meals and 1 capsule three times daily with snacks      . polyethylene glycol (MIRALAX) powder Take 17 g by mouth daily as needed. constipation      . pregabalin (LYRICA) 100 MG capsule Take 100 mg by mouth 3 (three) times daily.      . traZODone (DESYREL) 100 MG tablet Take 1 tablet (100 mg total) by mouth at bedtime.  30 tablet  3     REVIEW OF SYSTEMS:  Reviewed in her history and physical with the head  PHYSICAL EXAMINATION:  General: The patient is a well-nourished female, in no acute distress. Vital signs are BP 165/93  Pulse 93  Temp(Src) 97.4 F (36.3 C) (Oral)  Ht 5' 4.17" (1.63 m)  Wt 160 lb (72.576 kg)  BMI 27.32 kg/m2 Pulmonary: There is a good air exchange bilaterally Abdomen: Soft and non-tender. No surgical incisions Musculoskeletal: There are no major deformities.   Neurologic: No focal weakness or paresthesias are detected, Skin: Ulcerations over her great toe and second toe Psychiatric: The patient has normal affect. Cardiovascular: There is a regular rate and rhythm without significant murmur appreciated. Pulse status: 2+ femoral 2+ popliteal pulses bilaterally. Absent popliteal pulses    Impression and Plan:  Tibial vessel disease with increasing rest pain and now with [name] tissue loss on her left toes. Will proceed with formal arteriogram tomorrow.  Discussed this with the patient.    [name] [facility] [place] Office: [phone]

## 2013-02-21 NOTE — Progress Notes (Signed)
Pt return from cath lab via stretcher VSS, right groin level 0 pedial pulses dopplered. Pt c/o pain will medicate and monitor per orders.

## 2013-02-21 NOTE — Interval H&P Note (Signed)
History and Physical Interval Note:  02/21/2013 7:25 AM  [name]  has presented today for surgery, with the diagnosis of claudication  The various methods of treatment have been discussed with the patient and family. After consideration of risks, benefits and other options for treatment, the patient has consented to  Procedure(s): LOWER EXTREMITY ANGIOGRAM (N/A) as a surgical intervention .  The patient's history has been reviewed, patient examined, no change in status, stable for surgery.  I have reviewed the patient's chart and labs.  Questions were answered to the patient's satisfaction.     Fabrice Dyal IV, V. WELLS

## 2013-02-21 NOTE — Progress Notes (Addendum)
Subjective: She just had her arteriogram this morning and states no pain at this time while still under anesthesia effects. She had small amount of emesis with food particles this morning but denies abdominal pain, nausea, or vomiting since then.   Objective: Vital signs in last 24 hours: Filed Vitals:   02/20/13 2100 02/21/13 0527 02/21/13 0731 02/21/13 0747  BP:  131/84 123/74   Pulse: 89 85 82 86  Temp: 98 F (36.7 C) 98.1 F (36.7 C) 97.4 F (36.3 C)   TempSrc: Oral Oral Oral   Resp: 16 16 18    Height:      Weight:      SpO2: 95% 95% 100%    Weight change:   Intake/Output Summary (Last 24 hours) at 02/21/13 1137 Last data filed at 02/21/13 0900  Gross per 24 hour  Intake 815.95 ml  Output      0 ml  Net 815.95 ml   Vitals reviewed. General: resting in bed, somnolent, in NAD HEENT: no scleral icterus Cardiac: RRR, no rubs, murmurs or gallops Pulm: fine crackle at LL lung field, no wheezes, or rhonchi Abd: soft, nontender, nondistended, BS present Ext: warm and well perfused, bilateral LE with increase profusion evidenced by feet warmer to touch compared to yesterday, DP 2+ b/l,  no pedal edema Neuro: alert and oriented X3, cranial nerves II-XII grossly intact, strength and sensation to light touch equal in bilateral upper and lower extremities  Lab Results: Basic Metabolic Panel:  Recent Labs Lab 02/20/13 1727 02/21/13 0540  NA  --  136  K  --  3.5  CL  --  108  CO2  --  17*  GLUCOSE  --  177*  BUN  --  18  CREATININE  --  0.91  CALCIUM  --  8.7  MG 2.0  --   PHOS 3.7  --    CBC:  Recent Labs Lab 02/20/13 1727 02/21/13 0540  WBC 7.1 7.7  HGB 12.3 11.5*  HCT 35.7* 33.6*  MCV 89.9 89.6  PLT 232 218   CBG:  Recent Labs Lab 02/20/13 1700 02/20/13 2128 02/21/13 0730 02/21/13 1004  GLUCAP 78 133* 140* 120*   Coagulation:  Recent Labs Lab 02/20/13 1727  LABPROT 12.7  INR 0.96   Urine Drug Screen: Drugs of Abuse     Component Value  Date/Time   LABOPIA POSITIVE* 02/21/2013 0151   COCAINSCRNUR NONE DETECTED 02/21/2013 0151   LABBENZ NONE DETECTED 02/21/2013 0151   AMPHETMU NONE DETECTED 02/21/2013 0151   THCU NONE DETECTED 02/21/2013 0151   LABBARB NONE DETECTED 02/21/2013 0151    Urinalysis:  Recent Labs Lab 02/21/13 0152  COLORURINE YELLOW  LABSPEC 1.016  PHURINE 6.5  GLUCOSEU NEGATIVE  HGBUR NEGATIVE  BILIRUBINUR NEGATIVE  KETONESUR NEGATIVE  PROTEINUR NEGATIVE  UROBILINOGEN 0.2  NITRITE NEGATIVE  LEUKOCYTESUR NEGATIVE    Micro Results: Recent Results (from the past 240 hour(s))  MRSA PCR SCREENING     Status: Abnormal   Collection Time    02/21/13  2:36 AM      Result Value Range Status   MRSA by PCR POSITIVE (*) NEGATIVE Final   Comment:            The GeneXpert MRSA Assay (FDA     approved for NASAL specimens     only), is one component of a     comprehensive MRSA colonization     surveillance program. It is not     intended to diagnose  MRSA     infection nor to guide or     monitor treatment for     MRSA infections.     RESULT CALLED TO, READ BACK BY AND VERIFIED WITH:     COLUMBRES,S RN [phone] 02/21/13 MITCHELL,L   Studies/Results: X-ray Chest Pa And Lateral   02/20/2013  *RADIOLOGY REPORT*  Clinical Data: Cough.  CHEST - 2 VIEW  Comparison: 09/30/2011  Findings: Normal heart size and pulmonary vascularity.  Central interstitial changes are similar to previous study and may represent chronic bronchitis.  No superimposed airspace disease or consolidation.  No blunting of costophrenic angles.  No pneumothorax.  Mediastinal contours appear intact.  IMPRESSION: Central interstitial changes are stable since previous study.  No evidence of active disease.   Original Report Authenticated By: Burman Nieves, M.D.    US Arterial Seg Multiple  02/20/2013  *RADIOLOGY REPORT*  Clinical Data: Personal history of tobacco use for 30-40 years, cold toes with discoloration and ulceration.  NONINVASIVE  PHYSIOLOGIC VASCULAR STUDY OF BILATERAL LOWER EXTREMITIES  Technique:  Evaluation of both lower extremities was performed at rest, including calculation of ankle-brachial indices, multiple segmental pressure evaluation, segmental Doppler and segmental pulse volume recording.  Comparison:  None.  Findings:  Right ABI: 0.54  Left ABI: 0.65  Right Lower Extremity: In the right lower extremity, there is a significant pressure differential between the popliteal artery and the infrapopliteal vessels distally (143-76 (consistent with infrapopliteal disease.  Additionally the waveforms, from a normal appearance to monophasic wave forms in the infrapopliteal vessels. No digital waveforms or pressures could be obtained.  Left Lower Extremity: In the left lower extremity, there is a significant pressure differential between the popliteal artery and the infrapopliteal vessels distally (123-91) consistent with infrapopliteal disease.  Additionally the waveforms, from a normal appearance to monophasic wave forms in the infrapopliteal vessels. 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 This is slightly worse on the right than the left.  The need for further detailed evaluation by invasive or noninvasive (CTA or MRA) angiography can be determined on a clinical basis.   Original Report Authenticated By: Alcide Clever, M.D.    Medications: I have reviewed the patient's current medications. Scheduled Meds: . aspirin EC  81 mg Oral Daily  . Chlorhexidine Gluconate Cloth  6 each Topical Q0600  . desipramine  25 mg Oral QHS  . docusate sodium  100 mg Oral Daily  . efavirenz-emtricitabine-tenofovir  1 tablet Oral QHS  . ferrous sulfate  325 mg Oral TID WC  . insulin aspart  0-9 Units Subcutaneous TID WC  . insulin glargine  45 Units Subcutaneous QHS  . lipase/protease/amylase  1 capsule Oral TID WC  . loratadine  10 mg Oral Daily  . metoprolol succinate   50 mg Oral Daily  . mirtazapine  15 mg Oral QHS  . multivitamin with minerals  1 tablet Oral Daily  . mupirocin ointment  1 application Nasal BID  . pantoprazole  40 mg Oral Daily  . pregabalin  100 mg Oral TID  . sodium chloride  3 mL Intravenous Q12H  . traZODone  100 mg Oral QHS   Continuous Infusions: . sodium chloride 50 mL/hr at 02/20/13 2140  . sodium chloride 75 mL/hr at 02/21/13 1014  . heparin 1,000 Units/hr (02/21/13 0052)   PRN Meds:.sodium chloride, acetaminophen, acetaminophen, HYDROcodone-acetaminophen, morphine injection, ondansetron, polyethylene glycol powder, sodium chloride Assessment/Plan: Assessment & Plan by Problem:  56 year  old woman with well controlled HIV but moderate to severe PVD and current smoker presenting from ID clinic, direct admit for evaluation of severe left foot pain.   Left foot pain. She presented with severe left foot pain and tender right foot as well. She is now s/p LE arteriogram as well as aortogram with successful balloon angioplasty of popliteal artery and tibioperoneal trunk and peroneal artery.  -Vascular Surgery consulted, appreciate help managing this patient -Morphine PRN for pain  -Per Vascular Surgery, restart heparin 4 hours after procedure. Pharmacy already contacted by Dr. Myra Gianotti -Smoking cessation counseling    PVD with claudication. Likely cause of her left foot pain mentioned above.   AKI. Resolved. Cr of 0.91 this AM. Baseline Creatinine of 0.94, creatinine on admission at 1.3. Etiology unclear, but likely prerenal given quick response to IVF. UA with no proteinuria, RBC or casts.  -Strict in and out   CHF. 2D echo on 7/13 with EF of 45-50%, mild LVH, diffuse hypokinesia.  -Judicious use of IVF   DM2. Well controlled, last Hgb A1C at 6.7%. She is on Novolog 10 units TID with meals, Lantus 45 units qHS.  -Lantus 45 units qHS  -SSI  -CBG q4h   HIV. Welll controlled, last CD4 count on 02/1013 at 550 and undetectable  VL. She is followed by Dr. Daiva Eves at the ID clinic. She is on Atripla at home and we will continue this medication.   Hep C. Not on treatment. Last HCV quant in 406K.   Depression. Stable. We will continues her home Desipramine.   DVT prophylaxis. On heparin drip   FEN  NS @75ml /hr  Replete as needed  Carb mod diet   Dispo: Disposition is deferred at this time, awaiting improvement of current medical problems. Anticipated discharge in approximately 2-3 day(s).  The patient does have a current PCP Acey Lav, MD), therefore will not be requiring OPC follow-up after discharge.  The patient does not have transportation limitations that hinder transportation to clinic appointments.   .Services Needed at time of discharge: Y = Yes, Blank = No PT:   OT:   RN:   Equipment:   Other:     LOS: 1 day   Sara Chu D 02/21/2013, 11:37 AM

## 2013-02-21 NOTE — H&P (Signed)
Internal Medicine Attending Admission Note Date: 02/21/2013  Patient name: [name] Medical record number: [id number] Date of birth: 1956/06/06 Age: 57 y.o. Gender: female  I saw and evaluated the patient. I reviewed the resident's note and I agree with the resident's findings and plan as documented in the resident's note, with the following additional comments.  Chief Complaint(s): Left foot pain  History - key components related to admission: Patient is a 57 year old woman with history of HIV, hepatitis C, peripheral vascular disease with claudication, diabetes mellitus, tobacco, and other medical problems as outlined in the medical history, admitted from the ID clinic with complaint of left foot pain.  Patient underwent percutaneous intervention today by vascular surgery and was alert and oriented when I saw her following the procedure.  Physical Exam - key components related to admission:  Filed Vitals:   02/21/13 0527 02/21/13 0731 02/21/13 0747 02/21/13 1409  BP: 131/84 123/74  123/74  Pulse: 85 82 86   Temp: 98.1 F (36.7 C) 97.4 F (36.3 C)    TempSrc: Oral Oral    Resp: 16 18    Height:      Weight:      SpO2: 95% 100%     Gen. Alert, oriented Lungs: Clear Heart: Regular, no S3, no S4, no murmurs Abdomen: Bowel sounds present, soft, nontender Extremities: Diminished pedal pulses  Lab results:   Basic Metabolic Panel:  Recent Labs  84/13/24 1727 02/21/13 0540  NA  --  136  K  --  3.5  CL  --  108  CO2  --  17*  GLUCOSE  --  177*  BUN  --  18  CREATININE  --  0.91  CALCIUM  --  8.7  MG 2.0  --   PHOS 3.7  --    Liver Function Tests: No results found for this basename: AST, ALT, ALKPHOS, BILITOT, PROT, ALBUMIN,  in the last 72 hours No results found for this basename: LIPASE, AMYLASE,  in the last 72 hours No results found for this basename: AMMONIA,  in the last 72 hours CBC:  Recent Labs  02/20/13 1727 02/21/13 0540  WBC 7.1 7.7  HGB 12.3  11.5*  HCT 35.7* 33.6*  MCV 89.9 89.6  PLT 232 218   Cardiac Enzymes: No results found for this basename: CKTOTAL, CKMB, CKMBINDEX, TROPONINI,  in the last 72 hours BNP: No components found with this basename: POCBNP,  D-Dimer: No results found for this basename: DDIMER,  in the last 72 hours CBG:  Recent Labs  02/20/13 1700 02/20/13 2128 02/21/13 0730 02/21/13 1004 02/21/13 1339  GLUCAP 78 133* 140* 120* 82    Coagulation:  Recent Labs  02/20/13 1727  INR 0.96    Imaging results:  X-ray Chest Pa And Lateral   02/20/2013  *RADIOLOGY REPORT*  Clinical Data: Cough.  CHEST - 2 VIEW  Comparison: 09/30/2011  Findings: Normal heart size and pulmonary vascularity.  Central interstitial changes are similar to previous study and may represent chronic bronchitis.  No superimposed airspace disease or consolidation.  No blunting of costophrenic angles.  No pneumothorax.  Mediastinal contours appear intact.  IMPRESSION: Central interstitial changes are stable since previous study.  No evidence of active disease.   Original Report Authenticated By: Burman Nieves, M.D.    US Arterial Seg Multiple  02/20/2013  *RADIOLOGY REPORT*  Clinical Data: Personal history of tobacco use for 30-40 years, cold toes with discoloration and ulceration.  NONINVASIVE PHYSIOLOGIC VASCULAR STUDY OF  BILATERAL LOWER EXTREMITIES  Technique:  Evaluation of both lower extremities was performed at rest, including calculation of ankle-brachial indices, multiple segmental pressure evaluation, segmental Doppler and segmental pulse volume recording.  Comparison:  None.  Findings:  Right ABI: 0.54  Left ABI: 0.65  Right Lower Extremity: In the right lower extremity, there is a significant pressure differential between the popliteal artery and the infrapopliteal vessels distally (143-76 (consistent with infrapopliteal disease.  Additionally the waveforms, from a normal appearance to monophasic wave forms in the infrapopliteal  vessels. No digital waveforms or pressures could be obtained.  Left Lower Extremity: In the left lower extremity, there is a significant pressure differential between the popliteal artery and the infrapopliteal vessels distally (123-91) consistent with infrapopliteal disease.  Additionally the waveforms, from a normal appearance to monophasic wave forms in the infrapopliteal vessels. 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 This is slightly worse on the right than the left.  The need for further detailed evaluation by invasive or noninvasive (CTA or MRA) angiography can be determined on a clinical basis.   Original Report Authenticated By: Alcide Clever, M.D.     Assessment & Plan by Problem:  1.  Left foot pain likely due to peripheral vascular disease.  Patient is now status post angioplasty of the left popliteal artery, left tibial perineal trunk, and the left peroneal artery by Dr. Anner Crete.  Plan is post procedure management as per Dr. Anner Crete including resumption of heparin at the appropriate time interval.  2.  Diabetes mellitus.  Lantus plus sliding scale insulin, followed glucose and adjust regimen as indicated.  3.  HIV.  Continue home regimen as per Dr. Daiva Eves.  4.  Other problems as per resident physician's note.

## 2013-02-22 DIAGNOSIS — B1710 Acute hepatitis C without hepatic coma: Secondary | ICD-10-CM

## 2013-02-22 LAB — HEPARIN LEVEL (UNFRACTIONATED)
Heparin Unfractionated: 0.22 IU/mL — ABNORMAL LOW (ref 0.30–0.70)
Heparin Unfractionated: 0.37 IU/mL (ref 0.30–0.70)

## 2013-02-22 LAB — GLUCOSE, CAPILLARY
Glucose-Capillary: 48 mg/dL — ABNORMAL LOW (ref 70–99)
Glucose-Capillary: 80 mg/dL (ref 70–99)
Glucose-Capillary: 113 mg/dL — ABNORMAL HIGH (ref 70–99)
Glucose-Capillary: 99 mg/dL (ref 70–99)

## 2013-02-22 LAB — BASIC METABOLIC PANEL
CO2: 20 mEq/L (ref 19–32)
Calcium: 8.5 mg/dL (ref 8.4–10.5)
Creatinine, Ser: 0.79 mg/dL (ref 0.50–1.10)
Glucose, Bld: 156 mg/dL — ABNORMAL HIGH (ref 70–99)

## 2013-02-22 LAB — CBC
HCT: 33.1 % — ABNORMAL LOW (ref 36.0–46.0)
MCHC: 34.1 g/dL (ref 30.0–36.0)
MCV: 90.4 fL (ref 78.0–100.0)
RDW: 14.6 % (ref 11.5–15.5)

## 2013-02-22 MED ORDER — POTASSIUM CHLORIDE CRYS ER 20 MEQ PO TBCR
20.0000 meq | EXTENDED_RELEASE_TABLET | Freq: Two times a day (BID) | ORAL | Status: DC
  Administered 2013-02-22 – 2013-02-23 (×3): 20 meq via ORAL
  Filled 2013-02-22 (×4): qty 1

## 2013-02-22 MED ORDER — HEPARIN SODIUM (PORCINE) 5000 UNIT/ML IJ SOLN
5000.0000 [IU] | Freq: Three times a day (TID) | INTRAMUSCULAR | Status: DC
  Administered 2013-02-22 – 2013-02-23 (×4): 5000 [IU] via SUBCUTANEOUS
  Filled 2013-02-22 (×6): qty 1

## 2013-02-22 NOTE — Progress Notes (Addendum)
Subjective: Her left toes are still very tender to palpation but her pain is well controlled with morphine PRN. She denies confusion, headache, N/V, abdominal pain, melena, hematochezia or bleeding from her right groin incision site for her arteriogram.    Objective: Vital signs in last 24 hours: Filed Vitals:   02/21/13 1739 02/21/13 2218 02/22/13 0548 02/22/13 0954  BP: 151/84 114/69 123/85 136/84  Pulse: 101 101 102 96  Temp:  98.2 F (36.8 C) 97.9 F (36.6 C) 98.6 F (37 C)  TempSrc:  Oral Oral Oral  Resp:  20 18 18   Height:      Weight:      SpO2:  97% 98% 97%   Weight change:   Intake/Output Summary (Last 24 hours) at 02/22/13 1154 Last data filed at 02/22/13 0751  Gross per 24 hour  Intake 1902.5 ml  Output      0 ml  Net 1902.5 ml   Vitals reviewed.  General: resting in bed, in NAD HEENT: no scleral icterus  Cardiac: RRR, no rubs, murmurs or gallops  Pulm: fine crackle at LL lung field, no wheezes, or rhonchi  Abd: soft, nontender, nondistended, BS present  Ext: warm and well perfused, bilateral LE with increase profusion evidenced by her feet being warmer to touch compared to the day of her presentation, DP 2+ b/l by Doppler, no pedal edema. Left toes still very TTP and limited ROM. Right toes with normal range of motion.  Neuro: alert and oriented X3, cranial nerves II-XII grossly intact, strength and sensation to light touch equal in bilateral upper and lower extremities  Lab Results: Basic Metabolic Panel:  Recent Labs Lab 02/20/13 1727 02/21/13 0540 02/22/13 0945  NA  --  136 135  K  --  3.5 3.4*  CL  --  108 106  CO2  --  17* 20  GLUCOSE  --  177* 156*  BUN  --  18 13  CREATININE  --  0.91 0.79  CALCIUM  --  8.7 8.5  MG 2.0  --   --   PHOS 3.7  --   --    CBC:  Recent Labs Lab 02/21/13 0540 02/22/13 0634  WBC 7.7 7.4  HGB 11.5* 11.3*  HCT 33.6* 33.1*  MCV 89.6 90.4  PLT 218 182   CBG:  Recent Labs Lab 02/21/13 1339 02/21/13 1626  02/21/13 2213 02/22/13 0743 02/22/13 0812 02/22/13 0849  GLUCAP 82 100* 122* 41* 48* 80   Coagulation:  Recent Labs Lab 02/20/13 1727  LABPROT 12.7  INR 0.96   Urine Drug Screen: Drugs of Abuse     Component Value Date/Time   LABOPIA POSITIVE* 02/21/2013 0151   COCAINSCRNUR NONE DETECTED 02/21/2013 0151   LABBENZ NONE DETECTED 02/21/2013 0151   AMPHETMU NONE DETECTED 02/21/2013 0151   THCU NONE DETECTED 02/21/2013 0151   LABBARB NONE DETECTED 02/21/2013 0151    Urinalysis:  Recent Labs Lab 02/21/13 0152  COLORURINE YELLOW  LABSPEC 1.016  PHURINE 6.5  GLUCOSEU NEGATIVE  HGBUR NEGATIVE  BILIRUBINUR NEGATIVE  KETONESUR NEGATIVE  PROTEINUR NEGATIVE  UROBILINOGEN 0.2  NITRITE NEGATIVE  LEUKOCYTESUR NEGATIVE    Micro Results: Recent Results (from the past 240 hour(s))  MRSA PCR SCREENING     Status: Abnormal   Collection Time    02/21/13  2:36 AM      Result Value Range Status   MRSA by PCR POSITIVE (*) NEGATIVE Final   Comment:  The GeneXpert MRSA Assay (FDA     approved for NASAL specimens     only), is one component of a     comprehensive MRSA colonization     surveillance program. It is not     intended to diagnose MRSA     infection nor to guide or     monitor treatment for     MRSA infections.     RESULT CALLED TO, READ BACK BY AND VERIFIED WITH:     COLUMBRES,S RN [phone] 02/21/13 MITCHELL,L   Studies/Results: X-ray Chest Pa And Lateral   02/20/2013  *RADIOLOGY REPORT*  Clinical Data: Cough.  CHEST - 2 VIEW  Comparison: 09/30/2011  Findings: Normal heart size and pulmonary vascularity.  Central interstitial changes are similar to previous study and may represent chronic bronchitis.  No superimposed airspace disease or consolidation.  No blunting of costophrenic angles.  No pneumothorax.  Mediastinal contours appear intact.  IMPRESSION: Central interstitial changes are stable since previous study.  No evidence of active disease.   Original Report  Authenticated By: Burman Nieves, M.D.    US Arterial Seg Multiple  02/20/2013  *RADIOLOGY REPORT*  Clinical Data: Personal history of tobacco use for 30-40 years, cold toes with discoloration and ulceration.  NONINVASIVE PHYSIOLOGIC VASCULAR STUDY OF BILATERAL LOWER EXTREMITIES  Technique:  Evaluation of both lower extremities was performed at rest, including calculation of ankle-brachial indices, multiple segmental pressure evaluation, segmental Doppler and segmental pulse volume recording.  Comparison:  None.  Findings:  Right ABI: 0.54  Left ABI: 0.65  Right Lower Extremity: In the right lower extremity, there is a significant pressure differential between the popliteal artery and the infrapopliteal vessels distally (143-76 (consistent with infrapopliteal disease.  Additionally the waveforms, from a normal appearance to monophasic wave forms in the infrapopliteal vessels. No digital waveforms or pressures could be obtained.  Left Lower Extremity: In the left lower extremity, there is a significant pressure differential between the popliteal artery and the infrapopliteal vessels distally (123-91) consistent with infrapopliteal disease.  Additionally the waveforms, from a normal appearance to monophasic wave forms in the infrapopliteal vessels. 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 This is slightly worse on the right than the left.  The need for further detailed evaluation by invasive or noninvasive (CTA or MRA) angiography can be determined on a clinical basis.   Original Report Authenticated By: Alcide Clever, M.D.    Medications: I have reviewed the patient's current medications. Scheduled Meds: . aspirin EC  81 mg Oral Daily  . Chlorhexidine Gluconate Cloth  6 each Topical Q0600  . desipramine  25 mg Oral QHS  . docusate sodium  100 mg Oral Daily  . efavirenz-emtricitabine-tenofovir  1 tablet Oral QHS  . ferrous sulfate   325 mg Oral TID WC  . insulin aspart  0-9 Units Subcutaneous TID WC  . insulin glargine  45 Units Subcutaneous QHS  . lipase/protease/amylase  1 capsule Oral TID WC  . loratadine  10 mg Oral Daily  . metoprolol succinate  50 mg Oral Daily  . mirtazapine  15 mg Oral QHS  . multivitamin with minerals  1 tablet Oral Daily  . mupirocin ointment  1 application Nasal BID  . pantoprazole  40 mg Oral Daily  . pregabalin  100 mg Oral TID  . sodium chloride  3 mL Intravenous Q12H  . traZODone  100 mg Oral QHS   Continuous Infusions: . sodium chloride  50 mL/hr at 02/20/13 2140  . sodium chloride 75 mL/hr at 02/22/13 0006  . heparin 1,000 Units/hr (02/22/13 0059)   PRN Meds:.sodium chloride, acetaminophen, acetaminophen, alum & mag hydroxide-simeth, guaiFENesin-dextromethorphan, hydrALAZINE, HYDROcodone-acetaminophen, labetalol, metoprolol, morphine injection, ondansetron, phenol, polyethylene glycol powder, sodium chloride Assessment/Plan: Assessment & Plan by Problem:  57 year old woman with well controlled HIV but moderate to severe PVD and current smoker presenting from ID clinic, direct admit for evaluation of severe left foot pain.   Left foot pain. She presented with severe left foot pain and tender right foot as well. She is now s/p lower extremity arteriogram as well as aortogram with successful balloon angioplasty of popliteal artery and tibioperoneal trunk and peroneal artery (unclear if left or right). -Vascular Surgery consulted, appreciate help managing this patient  -Morphine PRN for pain  -Norco PRN for pain -Per Vascular Surgery, heparin drip restarted 4 hours after sheath removal. -Smoking cessation counseling   PVD with claudication. Likely cause of her left foot pain mentioned above.   Hypokalemia. K of 3.4 today, hypokalemia likely secondary to NPO diet yesterday.  -Kdur BID -BMET in AM  AKI. Resolved. Cr of 0.79 this AM. Baseline Creatinine of 0.94, creatinine on  admission at 1.3. Etiology unclear, but likely prerenal given quick response to IVF. UA with no proteinuria, RBC or casts.  -Strict in and out   CHF. 2D echo on 7/13 with EF of 45-50%, mild LVH, diffuse hypokinesia.  -Judicious use of IVF   DM2. Well controlled, last Hgb A1C at 6.7%. She is on Novolog 10 units TID with meals, Lantus 45 units qHS.  -Lantus 45 units qHS  -SSI  -CBG q4h   HIV. Welll controlled, last CD4 count on 02/1013 at 550 and undetectable VL. She is followed by Dr. Daiva Eves at the ID clinic. She is on Atripla at home and we will continue this medication.   Hep C. Not on treatment. Last HCV quant in 406K.   Depression. Stable. We will continues her home Desipramine.   DVT prophylaxis. On heparin drip   FEN  NS @50ml /hr  Replete as needed  Carb mod diet   Dispo: Disposition is deferred at this time, awaiting improvement of current medical problems. Anticipated discharge in approximately 2-3 day(s).  The patient does have a current PCP Acey Lav, MD), therefore will not be requiring OPC follow-up after discharge.  The patient does not have transportation limitations that hinder transportation to clinic appointments.  .Services Needed at time of discharge: Y = Yes, Blank = No  PT:    OT:    RN:    Equipment:    Other:         LOS: 2 days   Sara Chu D 02/22/2013, 11:54 AM

## 2013-02-22 NOTE — Evaluation (Signed)
Occupational Therapy Evaluation Patient Details Name: [name] MRN: [id number] DOB: 06/10/1956 Today's Date: 02/22/2013 Time: 4098-1191 OT Time Calculation (min): 15 min  OT Assessment / Plan / Recommendation Clinical Impression  Pt admitted with left foot pain and arterial occlusion.  Will benefit from acute OT services in prep for return to ALF.      OT Assessment  Patient needs continued OT Services    Follow Up Recommendations  Supervision - Intermittent    Barriers to Discharge      Equipment Recommendations  3 in 1 bedside comode    Recommendations for Other Services    Frequency  Min 2X/week    Precautions / Restrictions Precautions Precautions: Fall   Pertinent Vitals/Pain See vitals    ADL  Grooming: Performed;Wash/dry hands;Wash/dry face;Set up Where Assessed - Grooming: Unsupported sitting Upper Body Bathing: Simulated;Set up Where Assessed - Upper Body Bathing: Unsupported sitting Lower Body Bathing: Simulated;Set up Where Assessed - Lower Body Bathing: Unsupported sitting Upper Body Dressing: Simulated;Set up Where Assessed - Upper Body Dressing: Unsupported sitting Lower Body Dressing: Performed;Minimal assistance Where Assessed - Lower Body Dressing: Unsupported sitting Toilet Transfer: Simulated;Min guard Toilet Transfer Method: Stand pivot Acupuncturist:  (bed <>chair) Equipment Used: Rolling walker Transfers/Ambulation Related to ADLs: Min guard for SPT from bed<>chair.  Pt avoiding put weight through LLE. ADL Comments: Pt performed transfer bed<>chair but then requesting to return to bed due to fatigue. Reported during session that she just wanted to rest.      OT Diagnosis: Acute pain  OT Problem List: Decreased activity tolerance;Decreased knowledge of use of DME or AE;Pain OT Treatment Interventions: Self-care/ADL training;DME and/or AE instruction;Therapeutic activities;Patient/family education   OT Goals Acute Rehab OT  Goals OT Goal Formulation: With patient Time For Goal Achievement: 03/01/13 Potential to Achieve Goals: Good ADL Goals Pt Will Perform Lower Body Dressing: with modified independence;Sit to stand from chair;Sit to stand from bed ADL Goal: Lower Body Dressing - Progress: Goal set today Pt Will Transfer to Toilet: with modified independence;Stand pivot transfer;with DME;3-in-1 ADL Goal: Toilet Transfer - Progress: Goal set today Pt Will Perform Toileting - Clothing Manipulation: with modified independence;Standing ADL Goal: Toileting - Clothing Manipulation - Progress: Goal set today  Visit Information  Last OT Received On: 02/22/13 Assistance Needed: +1    Subjective Data      Prior Functioning     Home Living Available Help at Discharge: Personal care attendant;Available PRN/intermittently Type of Home: Assisted living Home Access: Level entry Home Layout: One level Bathroom Shower/Tub: Health visitor: Standard Home Adaptive Equipment: Straight cane Prior Function Level of Independence: Independent with assistive device(s) Comments: Pt reports there is nursing staff available at ALF to assist as much needed.   Communication Communication: No difficulties         Vision/Perception     Cognition  Cognition Overall Cognitive Status: Appears within functional limits for tasks assessed/performed Arousal/Alertness: Awake/alert Orientation Level: Appears intact for tasks assessed Behavior During Session: [facility] for tasks performed    Extremity/Trunk Assessment Right Upper Extremity Assessment RUE ROM/Strength/Tone: [facility] for tasks assessed Left Upper Extremity Assessment LUE ROM/Strength/Tone: [facility] for tasks assessed     Mobility Bed Mobility Bed Mobility: Supine to Sit;Sitting - Scoot to Edge of Bed Supine to Sit: 6: Modified independent (Device/Increase time);HOB elevated Sitting - Scoot to Edge of Bed: 6: Modified independent (Device/Increase  time) Details for Bed Mobility Assistance: Incr time Transfers Transfers: Sit to Stand;Stand to Sit Sit to Stand:  4: Min guard;From chair/3-in-1;From bed;With upper extremity assist Stand to Sit: 4: Min guard;To bed;To chair/3-in-1;With armrests;With upper extremity assist Details for Transfer Assistance: Avoiding WBing on left foot. Min guard for safety.     Exercise     Balance     End of Session OT - End of Session Activity Tolerance: Patient limited by fatigue Patient left: in bed;with call bell/phone within reach Nurse Communication: Mobility status  GO    02/22/2013 Cipriano Mile OTR/L Pager [phone] Office [phone]  Cipriano Mile 02/22/2013, 4:43 PM

## 2013-02-22 NOTE — Progress Notes (Signed)
Clinical Social Work Department BRIEF PSYCHOSOCIAL ASSESSMENT 02/22/2013  Patient:  [name], [name]     Account Number:  [id number]     Admit date:  02/20/2013  Clinical Social Worker:  Lourdes Sledge  Date/Time:  02/22/2013 03:58 PM  Referred by:  Physician  Date Referred:  02/22/2013 Referred for  ALF Placement   Other Referral:   Interview type:  Patient Other interview type:    PSYCHOSOCIAL DATA Living Status:  FACILITY Admitted from facility:  [facility] Level of care:  Assisted Living Primary support name:  Louie Casa Scales [phone] Primary support relationship to patient:  SIBLING Degree of support available:   not reported    CURRENT CONCERNS Current Concerns  Post-Acute Placement   Other Concerns:    SOCIAL WORK ASSESSMENT / PLAN Covering CSW informed that pt admitted from [facility]    Covering CSW spoke with pt who confirmed she is a resident of [facility] and would like to return when medically ready. Pt appreciative of CSW assistance.    CSW also contacted [facility] and spoke with Darien Ramus who informed CSW that an FL2 will need to be faxed to [phone] ATTN: Bennie Hind once pt medically ready for discharged.   Assessment/plan status:  Psychosocial Support/Ongoing Assessment of Needs Other assessment/ plan:   Information/referral to community resources:   No resources needed at this time.    PATIENT'S/FAMILY'S RESPONSE TO PLAN OF CARE: Pt alert, oriented and agreeable to return to [facility] when medically ready.    CSW to remain following.        Theresia Bough, MSW, Theresia Majors [phone]

## 2013-02-22 NOTE — Progress Notes (Signed)
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  No Known Allergies  Patient Measurements: Height: 5' 4.17" (163 cm) Weight: 160 lb (72.576 kg) IBW/kg (Calculated) : 55.1 Heparin Dosing Weight: 70 kg  Vital Signs: Temp: 97.9 F (36.6 C) (03/19 0548) Temp src: Oral (03/19 0548) BP: 123/85 mmHg (03/19 0548) Pulse Rate: 102 (03/19 0548)  Labs:  Recent Labs  02/20/13 1727 02/21/13 0001 02/21/13 0540 02/22/13 0001 02/22/13 0634  HGB 12.3  --  11.5*  --  11.3*  HCT 35.7*  --  33.6*  --  33.1*  PLT 232  --  218  --  182  APTT 31  --   --   --   --   LABPROT 12.7  --   --   --   --   INR 0.96  --   --   --   --   HEPARINUNFRC  --  0.82*  --  0.22* 0.37  CREATININE  --   --  0.91  --   --     Estimated Creatinine Clearance: 66.9 ml/min (by C-G formula based on Cr of 0.91).      Assessment: 6 YOF s/p LE arteriogram and aortogram on 02/21/13 resumed on IV heparin.  Heparin level therapeutic; no bleeding reported.   Goal of Therapy:  HL 0.3 - 0.5 units/mL Monitor platelets by anticoagulation protocol: Yes    Plan:  - Continue heparin gtt at 1000 units/hr - Daily HL / CBC - F/U CBGs and insulin adjustment     Jatia Musa D. Laney Potash, PharmD, BCPS Pager:  [phone] 02/22/2013, 8:02 AM

## 2013-02-22 NOTE — Progress Notes (Signed)
Internal Medicine Attending  Date: 02/22/2013  Patient name: [name] Medical record number: [id number] Date of birth: 01-Jun-1956 Age: 57 y.o. Gender: female  I saw and evaluated the patient on AM rounds with house staff; see the note by resident Dr. Garald Braver for details of clinical findings and plans.  Further care and disposition to be guided by vascular surgery consult.

## 2013-02-22 NOTE — Progress Notes (Signed)
[facility] Progress Note  02/22/2013 12:55 PM POD 1  Subjective:  C/o left foot and leg hurting.  No complaints of the right leg.  Afebrile x 24 hrs  Filed Vitals:   02/22/13 0954  BP: 136/84  Pulse: 96  Temp: 98.6 F (37 C)  Resp: 18    Physical Exam: Incisions:  Right groin is soft without hematoma Extremities:  + doppler signals bilaterally in the DP/PT positions.  Left foot motor ability to move toes is decreased.  Right is in tact.  CBC    Component Value Date/Time   WBC 7.4 02/22/2013 0634   RBC 3.66* 02/22/2013 0634   HGB 11.3* 02/22/2013 0634   HCT 33.1* 02/22/2013 0634   PLT 182 02/22/2013 0634   MCV 90.4 02/22/2013 0634   MCH 30.9 02/22/2013 0634   MCHC 34.1 02/22/2013 0634   RDW 14.6 02/22/2013 0634   LYMPHSABS 2.0 02/13/2013 1017   MONOABS 0.9 02/13/2013 1017   EOSABS 0.2 02/13/2013 1017   BASOSABS 0.1 02/13/2013 1017    BMET    Component Value Date/Time   NA 135 02/22/2013 0945   K 3.4* 02/22/2013 0945   CL 106 02/22/2013 0945   CO2 20 02/22/2013 0945   GLUCOSE 156* 02/22/2013 0945   BUN 13 02/22/2013 0945   CREATININE 0.79 02/22/2013 0945   CREATININE 1.30* 02/13/2013 1017   CALCIUM 8.5 02/22/2013 0945   GFRNONAA >90 02/22/2013 0945   GFRAA >90 02/22/2013 0945    INR    Component Value Date/Time   INR 0.96 02/20/2013 1727     Intake/Output Summary (Last 24 hours) at 02/22/13 1255 Last data filed at 02/22/13 0751  Gross per 24 hour  Intake 1902.5 ml  Output      0 ml  Net 1902.5 ml     Assessment/Plan:  57 y.o. female is s/p:  1. ultrasound-guided access, right femoral artery  2. abdominal aortogram  3. bilateral lower extremity runoff  4. intra-arterial administration of nitroglycerin  5. additional order catheterization (peroneal artery)  6. angioplasty, left popliteal artery  7. angioplasty, left tibial peroneal trunk and peroneal artery   POD 1  -Creatinine WNL post angioplasty-left popliteal and left tibial peroneal trunk  and peroneal artery -pt does not need heparin gtt from vascular surgery stand point -pt does need to be on ASA 81 mg daily, which she is currently on. -pt is not having any issues with her right leg at this time.  Will continue to monitor right leg. -continue PT  Doreatha Massed, PA-C [facility] [phone] 02/22/2013 12:55 PM    Agree with the above.  I have seen and examined the patient.  Her left foot is warmer, post intervention, however, she still has significant pain.  She has no right leg complaints.  She does not require heparin at this time.  I would continue to treat her pain with narcotics.  Hopefully, with improved blood flow, it will improve over time.  If she gets to the paint where she can not tolerate the pain, consideration will be made for amputation.  I discussed this with the patient this morning.  Durene Cal

## 2013-02-22 NOTE — Progress Notes (Signed)
ANTICOAGULATION CONSULT NOTE - Follow Up Consult  Pharmacy Consult:  Heparin Indication: R/o arterial thrombosis in infrapopliteal artery  No Known Allergies  Patient Measurements: Height: 5' 4.17" (163 cm) Weight: 160 lb (72.576 kg) IBW/kg (Calculated) : 55.1 Heparin Dosing Weight: 70 kg  Vital Signs: Temp: 98.2 F (36.8 C) (03/18 2218) Temp src: Oral (03/18 2218) BP: 114/69 mmHg (03/18 2218) Pulse Rate: 101 (03/18 2218)  Labs:  Recent Labs  02/20/13 1727 02/21/13 0001 02/21/13 0540 02/22/13 0001  HGB 12.3  --  11.5*  --   HCT 35.7*  --  33.6*  --   PLT 232  --  218  --   APTT 31  --   --   --   LABPROT 12.7  --   --   --   INR 0.96  --   --   --   HEPARINUNFRC  --  0.82*  --  0.22*  CREATININE  --   --  0.91  --     Estimated Creatinine Clearance: 66.9 ml/min (by C-G formula based on Cr of 0.91).   Assessment: 26 YOF s/p LE arteriogram and aortogram on IV heparin.  Heparin level (0.22) is below-goal on 900 units/hr. No problem with line / infusion and no bleeding per RN.   Goal of Therapy:  HL 0.3 - 0.5 units/mL Monitor platelets by anticoagulation protocol: Yes    Plan:  1. Increase IV heparin to 1000 units/hr.  2. Heparin level in 6 hours.   Lorre Munroe, PharmD  02/22/2013, 12:45 AM

## 2013-02-22 NOTE — Evaluation (Signed)
Physical Therapy Evaluation Patient Details Name: [name] MRN: [id number] DOB: 09-08-1956 Today's Date: 02/22/2013 Time: 9562-1308 PT Time Calculation (min): 26 min  PT Assessment / Plan / Recommendation Clinical Impression  Pt adm with left foot pain and found to have arterial occlusion. Pt underwent angioplasty.  Needs skilled PT to maximize I and safety so she can return to her prior living situation which sounds like ALF.  Recommend PT eval when she returns to her facility.    PT Assessment  Patient needs continued PT services    Follow Up Recommendations  Home health PT (at ALF)    Does the patient have the potential to tolerate intense rehabilitation      Barriers to Discharge        Equipment Recommendations  Rolling walker with 5" wheels (if she doesn't have access to one.)    Recommendations for Other Services     Frequency Min 3X/week    Precautions / Restrictions Precautions Precautions: Fall   Pertinent Vitals/Pain Lt foot pain. Pt premedicated      Mobility  Bed Mobility Bed Mobility: Supine to Sit;Sitting - Scoot to Edge of Bed Supine to Sit: 6: Modified independent (Device/Increase time);HOB elevated Sitting - Scoot to Edge of Bed: 6: Modified independent (Device/Increase time) Details for Bed Mobility Assistance: Incr time Transfers Transfers: Sit to Stand;Stand to [employer] Transfers Sit to Stand: 4: Min guard;With upper extremity assist;From bed;From chair/3-in-1 Stand to Sit: 4: Min guard;With upper extremity assist;With armrests;To chair/3-in-1 Stand Pivot Transfers: 4: Min guard Details for Transfer Assistance: Pt with decr weight bearing on left foot due to pain. Ambulation/Gait Ambulation/Gait Assistance: 4: Min guard Ambulation Distance (Feet): 5 Feet Assistive device: Rolling walker Gait Pattern: Step-to pattern;Decreased step length - right Gait velocity: decr    Exercises     PT Diagnosis: Difficulty walking;Acute  pain;Generalized weakness  PT Problem List: Decreased strength;Decreased mobility;Pain PT Treatment Interventions: DME instruction;Gait training;Patient/family education;Functional mobility training;Therapeutic activities;Therapeutic exercise;Balance training   PT Goals Acute Rehab PT Goals PT Goal Formulation: With patient Time For Goal Achievement: 03/01/13 Potential to Achieve Goals: Good Pt will go Sit to Stand: with modified independence PT Goal: Sit to Stand - Progress: Goal set today Pt will go Stand to Sit: with modified independence PT Goal: Stand to Sit - Progress: Goal set today Pt will Ambulate: 51 - 150 feet;with modified independence;with least restrictive assistive device PT Goal: Ambulate - Progress: Goal set today  Visit Information  Last PT Received On: 02/22/13 Assistance Needed: +1    Subjective Data  Subjective: Pt states she has lived at [facility] for over a year. Patient Stated Goal: Return to [facility].   Prior Functioning  Home Living Type of Home: Assisted living Home Access: Level entry Home Layout: One level Home Adaptive Equipment: Straight cane Additional Comments: Pt states she can have a rolling walker if she needs one. Prior Function Level of Independence: Independent with assistive device(s)    Cognition  Cognition Overall Cognitive Status: Appears within functional limits for tasks assessed/performed Arousal/Alertness: Awake/alert Orientation Level: Appears intact for tasks assessed Behavior During Session: [facility] for tasks performed    Extremity/Trunk Assessment Right Lower Extremity Assessment RLE ROM/Strength/Tone: [facility] for tasks assessed Left Lower Extremity Assessment LLE ROM/Strength/Tone: Deficits;Due to pain LLE ROM/Strength/Tone Deficits: grossly 4/5   Balance Balance Balance Assessed: Yes Static Standing Balance Static Standing - Balance Support: Right upper extremity supported Static Standing - Level of Assistance: 5:  Stand by assistance  End  of Session PT - End of Session Activity Tolerance: Patient limited by pain Patient left: in chair;with call bell/phone within reach Nurse Communication: Mobility status  GP     [facility] 02/22/2013, 10:47 AM  [facility] PT [phone]

## 2013-02-23 DIAGNOSIS — F17200 Nicotine dependence, unspecified, uncomplicated: Secondary | ICD-10-CM | POA: Diagnosis present

## 2013-02-23 LAB — CBC
MCH: 30.2 pg (ref 26.0–34.0)
MCHC: 34 g/dL (ref 30.0–36.0)
Platelets: 192 10*3/uL (ref 150–400)
RBC: 3.81 MIL/uL — ABNORMAL LOW (ref 3.87–5.11)

## 2013-02-23 LAB — BASIC METABOLIC PANEL
CO2: 22 mEq/L (ref 19–32)
Calcium: 9.2 mg/dL (ref 8.4–10.5)
GFR calc non Af Amer: 66 mL/min — ABNORMAL LOW (ref >90)
Sodium: 140 mEq/L (ref 135–145)

## 2013-02-23 MED ORDER — INSULIN PEN NEEDLE 31G X 8 MM MISC: Status: DC

## 2013-02-23 MED ORDER — COLCHICINE 0.6 MG PO TABS: ORAL_TABLET | ORAL | Status: DC

## 2013-02-23 MED ORDER — INSULIN ASPART 100 UNIT/ML ~~LOC~~ SOLN: 10.0000 [IU] | Freq: Three times a day (TID) | SUBCUTANEOUS | Status: DC

## 2013-02-23 MED ORDER — NICOTINE 14 MG/24HR TD PT24: 1.0000 | MEDICATED_PATCH | TRANSDERMAL | Status: DC

## 2013-02-23 MED ORDER — HYDROCODONE-ACETAMINOPHEN 5-325 MG PO TABS: 1.0000 | ORAL_TABLET | Freq: Four times a day (QID) | ORAL | Status: DC | PRN

## 2013-02-23 MED ORDER — GLUCOSE BLOOD VI STRP: ORAL_STRIP | Status: DC

## 2013-02-23 MED ORDER — NICOTINE 7 MG/24HR TD PT24: 1.0000 | MEDICATED_PATCH | TRANSDERMAL | Status: DC

## 2013-02-23 MED ORDER — ACCU-CHEK SOFTCLIX LANCET DEV MISC: Status: DC

## 2013-02-23 NOTE — Progress Notes (Signed)
Called night coverage about pt b/s being 99- had 45 of lantus ordered- RN told to hold lantus. Will continue to monitor

## 2013-02-23 NOTE — Progress Notes (Signed)
Subjective: Her pain is well controlled with morphine PRN. She still has TTP of the left foot but with improved ROM of left toes. No complaint of right foot pain. Her right groin incision site with clean with no hematoma or bleeding.   Objective: Vital signs in last 24 hours: Filed Vitals:   02/22/13 0954 02/22/13 1444 02/22/13 2125 02/23/13 0530  BP: 136/84 120/75 114/68 152/81  Pulse: 96 86 98 94  Temp: 98.6 F (37 C) 98.4 F (36.9 C) 98.8 F (37.1 C) 97.8 F (36.6 C)  TempSrc: Oral Oral Oral Oral  Resp: 18 18 18 20   Height:      Weight:      SpO2: 97% 98% 95% 99%   Weight change:   Intake/Output Summary (Last 24 hours) at 02/23/13 1019 Last data filed at 02/23/13 0900  Gross per 24 hour  Intake    240 ml  Output    750 ml  Net   -510 ml   Vitals reviewed.  General: resting in bed, in NAD  HEENT: no scleral icterus  Cardiac: RRR, no rubs, murmurs or gallops  Pulm: fine crackle at LL lung field, no wheezes, or rhonchi  Abd: soft, nontender, nondistended, BS present  Ext: warm (left foot warmer than right foot) and well perfused, bilateral LE with increased profusion evidenced by her feet being warmer to touch compared to the day of her presentation, DP 2+ b/l by Doppler, no pedal edema. Left toes  TTP but with improved ROM. Right toes with normal range of motion. Right groin incision site with no active bleeding, oozing, or hematoma.  Neuro: alert and oriented X3, cranial nerves II-XII grossly intact, strength and sensation to light touch equal in bilateral upper and lower extremities  Lab Results: Basic Metabolic Panel:  Recent Labs Lab 02/20/13 1727  02/22/13 0945 02/23/13 0605  NA  --   < > 135 140  K  --   < > 3.4* 3.7  CL  --   < > 106 108  CO2  --   < > 20 22  GLUCOSE  --   < > 156* 112*  BUN  --   < > 13 12  CREATININE  --   < > 0.79 0.94  CALCIUM  --   < > 8.5 9.2  MG 2.0  --   --   --   PHOS 3.7  --   --   --   < > = values in this interval not  displayed. CBC:  Recent Labs Lab 02/22/13 0634 02/23/13 0605  WBC 7.4 6.7  HGB 11.3* 11.5*  HCT 33.1* 33.8*  MCV 90.4 88.7  PLT 182 192   CBG:  Recent Labs Lab 02/22/13 0812 02/22/13 0849 02/22/13 1209 02/22/13 1756 02/22/13 2121 02/23/13 0812  GLUCAP 48* 80 173* 113* 99 102*   Coagulation:  Recent Labs Lab 02/20/13 1727  LABPROT 12.7  INR 0.96   Urine Drug Screen: Drugs of Abuse     Component Value Date/Time   LABOPIA POSITIVE* 02/21/2013 0151   COCAINSCRNUR NONE DETECTED 02/21/2013 0151   LABBENZ NONE DETECTED 02/21/2013 0151   AMPHETMU NONE DETECTED 02/21/2013 0151   THCU NONE DETECTED 02/21/2013 0151   LABBARB NONE DETECTED 02/21/2013 0151    Urinalysis:  Recent Labs Lab 02/21/13 0152  COLORURINE YELLOW  LABSPEC 1.016  PHURINE 6.5  GLUCOSEU NEGATIVE  HGBUR NEGATIVE  BILIRUBINUR NEGATIVE  KETONESUR NEGATIVE  PROTEINUR NEGATIVE  UROBILINOGEN 0.2  NITRITE NEGATIVE  LEUKOCYTESUR NEGATIVE     Micro Results: Recent Results (from the past 240 hour(s))  MRSA PCR SCREENING     Status: Abnormal   Collection Time    02/21/13  2:36 AM      Result Value Range Status   MRSA by PCR POSITIVE (*) NEGATIVE Final   Comment:            The GeneXpert MRSA Assay (FDA     approved for NASAL specimens     only), is one component of a     comprehensive MRSA colonization     surveillance program. It is not     intended to diagnose MRSA     infection nor to guide or     monitor treatment for     MRSA infections.     RESULT CALLED TO, READ BACK BY AND VERIFIED WITH:     COLUMBRES,S RN [phone] 02/21/13 MITCHELL,L   Studies/Results: No results found. Medications: I have reviewed the patient's current medications. Scheduled Meds: . aspirin EC  81 mg Oral Daily  . Chlorhexidine Gluconate Cloth  6 each Topical Q0600  . desipramine  25 mg Oral QHS  . docusate sodium  100 mg Oral Daily  . efavirenz-emtricitabine-tenofovir  1 tablet Oral QHS  . ferrous sulfate  325  mg Oral TID WC  . heparin subcutaneous  5,000 Units Subcutaneous Q8H  . insulin aspart  0-9 Units Subcutaneous TID WC  . insulin glargine  45 Units Subcutaneous QHS  . lipase/protease/amylase  1 capsule Oral TID WC  . loratadine  10 mg Oral Daily  . metoprolol succinate  50 mg Oral Daily  . mirtazapine  15 mg Oral QHS  . multivitamin with minerals  1 tablet Oral Daily  . mupirocin ointment  1 application Nasal BID  . pantoprazole  40 mg Oral Daily  . potassium chloride SA  20 mEq Oral BID  . pregabalin  100 mg Oral TID  . sodium chloride  3 mL Intravenous Q12H  . traZODone  100 mg Oral QHS   Continuous Infusions: . sodium chloride 50 mL/hr at 02/22/13 1531   PRN Meds:.sodium chloride, acetaminophen, acetaminophen, alum & mag hydroxide-simeth, guaiFENesin-dextromethorphan, hydrALAZINE, HYDROcodone-acetaminophen, labetalol, metoprolol, morphine injection, ondansetron, phenol, polyethylene glycol powder, sodium chloride Assessment/Plan: Assessment & Plan by Problem:  57 year old woman with well controlled HIV but moderate to severe PVD and current smoker presenting from ID clinic, direct admit for evaluation of severe left foot pain.  Left foot pain. She presented with severe left foot pain and tender right foot as well. She is now s/p lower extremity arteriogram as well as aortogram with successful balloon angioplasty of LEFT popliteal artery and tibioperoneal trunk and peroneal artery. Per [facility], she will follow up with them as outpatient with decision for possible intervention to right limb or possible amputation if her pain worsens. She was extensively counseled on the importance of quitting smoking. She was smoking 6 cigarettes per day prior to her presentation and will be discharged with tapered nicotine patch.  She understood the risk of smoking with PVD and agreed to quit smoking.  -Vascular Surgery consulted, appreciate help managing this patient  -Discontinued Morphine PRN for pain   -Norco PRN for pain  -Discontinued Heparin drip, continue ASA 81mg  daily.  -Smoking cessation counseling   PVD with claudication. Likely cause of her left foot pain mentioned above.   Hypokalemia. Resolved. K of 3.7  Today. K of 3.4 yesterday, likely secondary to NPO  diet for her procedure, corrected with Kdur BID.     AKI. Resolved. Cr of 0.94 this AM. Baseline Creatinine of 0.94, creatinine on admission at 1.3. Etiology unclear, but likely prerenal given quick response to IVF. UA with no proteinuria, RBC or casts.  -Strict in and out   CHF. 2D echo on 7/13 with EF of 45-50%, mild LVH, diffuse hypokinesia.  -Judicious use of IVF   DM2. Well controlled, last Hgb A1C at 6.7%. She is on Novolog 10 units TID with meals, Lantus 45 units qHS.  -Lantus 45 units qHS  -SSI  -CBG q4h   HIV. Welll controlled, last CD4 count on 02/1013 at 550 and undetectable VL. She is followed by Dr. Daiva Eves at the ID clinic. She is on Atripla at home and we will continue this medication.   Hep C. Not on treatment. Last HCV quant in 406K.   Depression. Stable. We will continues her home Desipramine.   DVT prophylaxis. Heparin TID FEN  NSL Replete as needed  Carb mod diet   Dispo: Discharge to ALF today with [facility] PT/OT and DME bedside commode and rolling walker. FL2 in shadow chart signed.   The patient does have a current PCP Acey Lav, MD), therefore will not be requiring OPC follow-up after discharge.  The patient does not have transportation limitations that hinder transportation to clinic appointments.  .Services Needed at time of discharge: Y = Yes, Blank = No  PT:  Yes (HH paper script order given to Case manager)  OT:  Yes  RN:    Equipment:  DME 3 in 1 bedside CMD, rolling walker (paper scripts given to Case manager)  Other:        LOS: 3 days   [name] 02/23/2013, 10:19 AM

## 2013-02-23 NOTE — Progress Notes (Signed)
Covering Clinical Child psychotherapist (CSW) informed pt ready for dc back to [facility]. CSW faxed FL2 and dc summary to facility and confirmed pt okay to return. CSW has prepared and placed pt packet in shadow chart and informed that the facility will provide transportation. Pt aware and agreeable. CSW signing off. Theresia Bough, MSW, Theresia Majors [phone]

## 2013-02-23 NOTE — Discharge Summary (Signed)
Internal Medicine Teaching [facility] Discharge Note  Name: [name] MRN: [id number] DOB: 1956-07-01 57 y.o.  Date of Admission: 02/20/2013  3:26 PM Date of Discharge: 02/23/2013 Attending Physician: Farley Ly, MD  Discharge Diagnosis: Principal Problem:   Left foot pain Active Problems:   HIV INFECTION   HEPATITIS C   IDDM   DEPRESSION   PVD WITH CLAUDICATION   Tobacco use disorder   Discharge Medications:   Medication List    STOP taking these medications       meloxicam 7.5 MG tablet  Commonly known as:  MOBIC      TAKE these medications       accu-chek softclix lancets  Use to check blood sugar before each meal, at bedtime and when patient has symptoms of low blood sugar     aspirin 81 MG EC tablet  Take 1 tablet (81 mg total) by mouth daily.     ATRIPLA 600-200-300 MG per tablet  Generic drug:  efavirenz-emtricitabine-tenofovir  Take 1 tablet by mouth at bedtime.     cetirizine 10 MG tablet  Commonly known as:  ZYRTEC  Take 10 mg by mouth daily.     colchicine 0.6 MG tablet  Take three tablets once then one tablet one hour later and continue this regimen for 5 days then one tablet bid     CREON 6000 UNITS Cpep  Generic drug:  Pancrelipase (Lip-Prot-Amyl)  Take 12,000 Units by mouth 3 (three) times daily. Patient takes 2 capsules three times daily with meals and 1 capsule three times daily with snacks     desipramine 25 MG tablet  Commonly known as:  NORPRAMIN  Take 25 mg by mouth at bedtime.     docusate sodium 100 MG capsule  Commonly known as:  COLACE  Take 100 mg by mouth daily.     ferrous sulfate 325 (65 FE) MG tablet  TAKE ONE TABLET BY MOUTH ONCE DAILY.     glucose blood test strip  Commonly known as:  ACCU-CHEK COMPACT TEST DRUM  Use to check blood sugar before each meal, at bedtime and when patient has symptoms of low blood sugar     HYDROcodone-acetaminophen 5-325 MG per tablet  Commonly known as:  NORCO/VICODIN   Take 1 tablet by mouth every 6 (six) hours as needed for pain (left foot pain). pain     insulin aspart 100 UNIT/ML injection  Commonly known as:  novoLOG  Inject 10 Units into the skin 3 (three) times daily before meals. Inject 10 units plus 1 unit for every 30 MG/DL above 409 MG/DL subcutaneously at meals.     insulin glargine 100 UNIT/ML injection  Commonly known as:  LANTUS  Inject 45 Units into the skin at bedtime.     Insulin Pen Needle 31G X 8 MM Misc  Use to inject insulin at meal time and SSI     metoprolol succinate 50 MG 24 hr tablet  Commonly known as:  TOPROL-XL  Take 1 tablet (50 mg total) by mouth daily. Take with or immediately following a meal.     MIRALAX powder  Generic drug:  polyethylene glycol powder  Take 17 g by mouth daily as needed. constipation     mirtazapine 15 MG tablet  Commonly known as:  REMERON  Take 15 mg by mouth at bedtime.     multivitamin with minerals Tabs  Take 1 tablet by mouth daily.     nicotine 14 mg/24hr patch  Commonly known as:  NICODERM CQ - dosed in mg/24 hours  Place 1 patch onto the skin daily.     nicotine 7 mg/24hr patch  Commonly known as:  NICODERM CQ - dosed in mg/24 hr  Place 1 patch onto the skin daily.  Start taking on:  03/09/2013     omeprazole 20 MG capsule  Commonly known as:  PRILOSEC  Take 1 capsule (20 mg total) by mouth daily.     pregabalin 100 MG capsule  Commonly known as:  LYRICA  Take 100 mg by mouth 3 (three) times daily.     traZODone 100 MG tablet  Commonly known as:  DESYREL  Take 1 tablet (100 mg total) by mouth at bedtime.        Disposition and follow-up:   [name] was discharged from [facility] in Stable condition.  At the hospital follow up visit please address: -Smoking cessation -Reevaluate pain in left foot and toes -Assure that she follows up with her vascular surgeon  Follow-up Appointments: Follow-up Information   Schedule an appointment as  soon as possible for a visit with Acey Lav, MD.   Contact information:   [address] [place] [address] [phone]       Follow up with [facility]. (They will call you to make an appointment, perhaps with your surgeon, Dr. Myra Gianotti)    Contact information:   [address] [place] [address] [phone]     Discharge Orders   Future Appointments Provider Department Dept Phone   02/27/2013 9:45 AM Randall Hiss, MD [facility] Infectious Disease [phone]   04/26/2013 9:15 AM Rollene Rotunda, MD [facility] (near [place]) [phone]   Future Orders Complete By Expires     Diet - low sodium heart healthy  As directed     Increase activity slowly  As directed        Consultations: Treatment Team:  Larina Earthly, MD Nada Libman, MD  Procedures Performed:  X-ray Chest Pa And Lateral   02/20/2013  *RADIOLOGY REPORT*  Clinical Data: Cough.  CHEST - 2 VIEW  Comparison: 09/30/2011  Findings: Normal heart size and pulmonary vascularity.  Central interstitial changes are similar to previous study and may represent chronic bronchitis.  No superimposed airspace disease or consolidation.  No blunting of costophrenic angles.  No pneumothorax.  Mediastinal contours appear intact.  IMPRESSION: Central interstitial changes are stable since previous study.  No evidence of active disease.   Original Report Authenticated By: Burman Nieves, M.D.    US Arterial Seg Multiple  02/20/2013  *RADIOLOGY REPORT*  Clinical Data: Personal history of tobacco use for 30-40 years, cold toes with discoloration and ulceration.  NONINVASIVE PHYSIOLOGIC VASCULAR STUDY OF BILATERAL LOWER EXTREMITIES  Technique:  Evaluation of both lower extremities was performed at rest, including calculation of ankle-brachial indices, multiple segmental pressure evaluation, segmental Doppler and segmental pulse volume  recording.  Comparison:  None.  Findings:  Right ABI: 0.54  Left ABI: 0.65  Right Lower Extremity: In the right lower extremity, there is a significant pressure differential between the popliteal artery and the infrapopliteal vessels distally (143-76 (consistent with infrapopliteal disease.  Additionally the waveforms, from a normal appearance to monophasic wave forms in the infrapopliteal vessels. No digital waveforms or pressures could be obtained.  Left Lower Extremity: In the left lower extremity, there is a significant pressure differential  between the popliteal artery and the infrapopliteal vessels distally (123-91) consistent with infrapopliteal disease.  Additionally the waveforms, from a normal appearance to monophasic wave forms in the infrapopliteal vessels. 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 This is slightly worse on the right than the left.  The need for further detailed evaluation by invasive or noninvasive (CTA or MRA) angiography can be determined on a clinical basis.   Original Report Authenticated By: Alcide Clever, M.D.    Dg Foot Complete Left  02/06/2013  *RADIOLOGY REPORT*  Clinical Data: Painful toes  LEFT FOOT - COMPLETE 3+ VIEW  Comparison: None.  Findings: Moderate osteopenia.  Advanced degenerative change of the first metatarsophalangeal joint with mild hallux valgus.  No acute fracture.  Mild arterial calcifications are noted.  Minimal degenerative change of the IP joints.  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  Hallux rigidus may be present.  No acute bony pathology. Mild osteopenia.   Original Report Authenticated By: Jolaine Click, M.D.    Mm Digital Diag Ltd R  01/26/2013  *RADIOLOGY REPORT*  Clinical Data:  Called back from screening mammography, possible left breast mass.  DIGITAL DIAGNOSTIC RIGHT MAMMOGRAM  Comparison: 01/04/2013, 12/29/2011, dating back to  01/16/2009.  Findings:  ACR Breast Density Category 2: There is a scattered fibroglandular pattern.  Spot compression CC and MLO views of the outer right breast, posterior third, were obtained.  No persistent mass, architectural distortion or suspicious calcifications in the right breast at the area of concern on screening mammography.  IMPRESSION: No mammographic evidence of malignancy, right breast.  The area of concern on the screening mammogram is consistent with a summation of overlapping fibroglandular tissue.  RECOMMENDATION: Screening mammogram in one year. (Code:SM-B-01Y)  The patient was encouraged to perform monthly self breast examination and communicate any changes with her primary physician.  I have discussed the findings and recommendations with the patient. Results were also provided in writing at the conclusion of the visit.  BI-RADS CATEGORY 1:  Negative.   Original Report Authenticated By: Hulan Saas, M.D.    Aortogram, Right and left lower extremity arteriogram: 02/21/13 No evidence of renal artery stenosis.  30-40% stenosis at the origin of the right external iliac artery RIGHT LOWER EXTREMITY: Greater than 80% stenosis within the tibioperoneal trunk. There is ostial stenosis of the posterior tibial artery. This is the dominant runoff across the ankle. The peroneal artery is also patent at the ankle but somewhat diminutive LEFT LOWER EXTREMITY: 70% stenosis of popliteal behind the knee. The tibioperoneal trunk is diffusely diseased. The peroneal artery is the dominant runoff however there is a greater than 80% stenosis in the proximal portion of the artery. It does collateralize to the anterior tibial and posterior tibial artery at the ankle which create the plantar arch. INTERVENTION: Successful balloon angioplasty was performed on the LEFT. A 4 mm balloon was used in the popliteal artery and a 3 mm balloon was used in the tibioperoneal trunk and peroneal artery.  Admission HPI:   Ms. Franta is a 57 year old woman resident of [facility], with PMH of HIV infection (last CD4 count of 550 on 02/13/13, undetectable viral load), CHF (last echo 7/13 with EF 45-50%), Hepatitis C, PVD with claudication, DM, and tobacco abuse who comes in from the ID clinic for evaluation of left foot pain. She explains that two weeks ago she had right foot pain, was evaluated at  the SNF and treated for presumed gout. Her right foot pain improved but 10 days ago her left foot started hurting. She was seen in the ED, receiving treatment for gout and possible cellulitis with prescription for Bactrim which she took but had no improved of her left foot and toes pain. She was also seen by Dr. Daiva Eves at the ID clinic who examined her and found her left foot to be exquisitely tender. 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 She is being admitted for further diagnostic work up and symptomatic relief.  She denies fever/chills, cough, headache, confusion, shortness of breath, chest pain, abdominal pain, N/V, calf pain, dysuria, hematuria, or diarrhea.    Hospital Course by problem list:  Left foot pain. She presented with severe left foot pain and tender right foot as well likely due to her severe PVD noted in her bilateral lower extremity arterial ultrasonography with worse disease on the right. She was initially placed on a heparin drip for concerns of possible arterial occlusion but this was discontinued 24 hours after balloon angioplasty. She will remain on ASA 81mg  for anticoagulation therapy. She is now status post lower extremity arteriogram as well as aortogram with successful balloon angioplasty of LEFT popliteal artery and tibioperoneal trunk and peroneal artery. Her left foot pain is improving and well controlled with morphine IV PRN and she will be  discharged with prescription for Vicodin. Her left foot is warmer to the touch compared to when she first came in and the range of motion of her left toes is also improving. She has been evaluated by PT/OT and will require Home Health Physical Therapy and Occupational Therapy supervision at the ALF. Per Vascular Surgery, she will follow up with them as outpatient with the decision for possible intervention to right limb or possible amputation if her pain worsens deferred to a later time. She was extensively counseled on the importance of quitting smoking. She was smoking 6 cigarettes per day prior to her presentation and will be discharged with tapered nicotine patch. She understood the risk of smoking with PVD and agreed to quit smoking.   PVD with claudication. Likely cause of her left foot pain mentioned above.   Tobacco use. She was extensively counseled on the importance of quitting smoking. She states that she is ready to quit. She was smoking 6 cigarettes per day prior to her discharge. She is encouraged to discard her cigarettes upon her arrival to her ALF. She will be discharged with nicotine patch taper and instruction to not smoke cigarettes while she is on the nicotine patch. These instructions were explained in detail to her prior to her discharge and she stated that she understood them.   Hypokalemia. Resolved. Her potassium is at 3.7 today. Her K was 3.4 yesterday, likely secondary to NPO diet for her procedure; this was corrected with Kdur BID.   AKI. Resolved. Her creatinine prior to her discharge is  0.94. Her baseline Creatinine is 0.94, but her creatinine on admission was 1.3. Etiology unclear, but likely prerenal azotemia given quick improvement of her renal function with IV fluid rehydration. Her urinalysis had no proteinuria, RBC or casts.   CHF. Stable, euvolemic prior to her discharge. Her last 2D echo on 7/13 with EF of 45-50%, mild LVH, diffuse hypokinesia. She received  judicious use of IVF for her prerenal azotemia and  post-arteriogram with no signs of CHF exacerbation prior to her discharge.   DM2. Well controlled, last Hgb A1C at 6.7%. She is on Novolog 10 units TID with meals, Lantus 45 units qHS. We continued Lantus 45 units qHS with SSI. She will resume her home insulin regimen upon her discharge.   HIV. Welll controlled, last CD4 count on 02/1013 at 550 and undetectable VL. She is followed by Dr. Daiva Eves at the ID clinic. She is on Atripla at home and we will continue this medication.   Hep C. Not on treatment. Last HCV quant in 406K.   Depression. Stable. We will continues her home Desipramine.    Discharge Vitals:  BP 140/88  Pulse 90  Temp(Src) 97.8 F (36.6 C) (Oral)  Resp 20  Ht 5' 4.17" (1.63 m)  Wt 160 lb (72.576 kg)  BMI 27.32 kg/m2  SpO2 99%  Discharge Labs:  Results for orders placed during the hospital encounter of 02/20/13 (from the past 24 hour(s))  GLUCOSE, CAPILLARY     Status: Abnormal   Collection Time    02/22/13 12:09 PM      Result Value Range   Glucose-Capillary 173 (*) 70 - 99 mg/dL  GLUCOSE, CAPILLARY     Status: Abnormal   Collection Time    02/22/13  5:56 PM      Result Value Range   Glucose-Capillary 113 (*) 70 - 99 mg/dL  GLUCOSE, CAPILLARY     Status: None   Collection Time    02/22/13  9:21 PM      Result Value Range   Glucose-Capillary 99  70 - 99 mg/dL  CBC     Status: Abnormal   Collection Time    02/23/13  6:05 AM      Result Value Range   WBC 6.7  4.0 - 10.5 K/uL   RBC 3.81 (*) 3.87 - 5.11 MIL/uL   Hemoglobin 11.5 (*) 12.0 - 15.0 g/dL   HCT 82.9 (*) 56.2 - 13.0 %   MCV 88.7  78.0 - 100.0 fL   MCH 30.2  26.0 - 34.0 pg   MCHC 34.0  30.0 - 36.0 g/dL   RDW 86.5  78.4 - 69.6 %   Platelets 192  150 - 400 K/uL  BASIC METABOLIC PANEL     Status: Abnormal   Collection Time    02/23/13  6:05 AM      Result Value Range   Sodium 140  135 - 145 mEq/L   Potassium 3.7  3.5 - 5.1 mEq/L   Chloride 108   96 - 112 mEq/L   CO2 22  19 - 32 mEq/L   Glucose, Bld 112 (*) 70 - 99 mg/dL   BUN 12  6 - 23 mg/dL   Creatinine, Ser 2.95  0.50 - 1.10 mg/dL   Calcium 9.2  8.4 - 28.4 mg/dL   GFR calc non Af Amer 66 (*) >90 mL/min   GFR calc Af Amer 77 (*) >90 mL/min  GLUCOSE, CAPILLARY     Status: Abnormal   Collection Time    02/23/13  8:12 AM      Result Value Range   Glucose-Capillary 102 (*) 70 - 99 mg/dL    Signed: Sara Chu D 02/23/2013, 11:42 AM   Time Spent on Discharge: 60 minutes Services Ordered on Discharge: HH PT/OT at ALF, paper scripts given to Case Manager Equipment Ordered on Discharge:  DME 3 in 1 bedside commode, rolling walker

## 2013-02-23 NOTE — Progress Notes (Signed)
Darlen Gledhill Pfeffer discharged [facility] per MD order, report called to Alycia Rossetti, med. tech.  Discharge instructions reviewed and discussed with the patient, all questions and concerns answered. Copy of instructions given to patient, as well as one sent in packet with scripts.  Explained to pt. importance of stop smoking.    Medication List    STOP taking these medications       meloxicam 7.5 MG tablet  Commonly known as:  MOBIC      TAKE these medications       accu-chek softclix lancets  Use to check blood sugar before each meal, at bedtime and when patient has symptoms of low blood sugar     aspirin 81 MG EC tablet  Take 1 tablet (81 mg total) by mouth daily.     ATRIPLA 600-200-300 MG per tablet  Generic drug:  efavirenz-emtricitabine-tenofovir  Take 1 tablet by mouth at bedtime.     cetirizine 10 MG tablet  Commonly known as:  ZYRTEC  Take 10 mg by mouth daily.     colchicine 0.6 MG tablet  Take three tablets once then one tablet one hour later and continue this regimen for 5 days then one tablet bid     CREON 6000 UNITS Cpep  Generic drug:  Pancrelipase (Lip-Prot-Amyl)  Take 12,000 Units by mouth 3 (three) times daily. Patient takes 2 capsules three times daily with meals and 1 capsule three times daily with snacks     desipramine 25 MG tablet  Commonly known as:  NORPRAMIN  Take 25 mg by mouth at bedtime.     docusate sodium 100 MG capsule  Commonly known as:  COLACE  Take 100 mg by mouth daily.     ferrous sulfate 325 (65 FE) MG tablet  TAKE ONE TABLET BY MOUTH ONCE DAILY.     glucose blood test strip  Commonly known as:  ACCU-CHEK COMPACT TEST DRUM  Use to check blood sugar before each meal, at bedtime and when patient has symptoms of low blood sugar     HYDROcodone-acetaminophen 5-325 MG per tablet  Commonly known as:  NORCO/VICODIN  Take 1 tablet by mouth every 6 (six) hours as needed for pain (left foot pain). pain     insulin aspart 100 UNIT/ML  injection  Commonly known as:  novoLOG  Inject 10 Units into the skin 3 (three) times daily before meals. Inject 10 units plus 1 unit for every 30 MG/DL above 161 MG/DL subcutaneously at meals.     insulin glargine 100 UNIT/ML injection  Commonly known as:  LANTUS  Inject 45 Units into the skin at bedtime.     Insulin Pen Needle 31G X 8 MM Misc  Use to inject insulin at meal time and SSI     metoprolol succinate 50 MG 24 hr tablet  Commonly known as:  TOPROL-XL  Take 1 tablet (50 mg total) by mouth daily. Take with or immediately following a meal.     MIRALAX powder  Generic drug:  polyethylene glycol powder  Take 17 g by mouth daily as needed. constipation     mirtazapine 15 MG tablet  Commonly known as:  REMERON  Take 15 mg by mouth at bedtime.     multivitamin with minerals Tabs  Take 1 tablet by mouth daily.     nicotine 14 mg/24hr patch  Commonly known as:  NICODERM CQ - dosed in mg/24 hours  Place 1 patch onto the skin daily.  nicotine 7 mg/24hr patch  Commonly known as:  NICODERM CQ - dosed in mg/24 hr  Place 1 patch onto the skin daily.  Start taking on:  03/09/2013     omeprazole 20 MG capsule  Commonly known as:  PRILOSEC  Take 1 capsule (20 mg total) by mouth daily.     pregabalin 100 MG capsule  Commonly known as:  LYRICA  Take 100 mg by mouth 3 (three) times daily.     traZODone 100 MG tablet  Commonly known as:  DESYREL  Take 1 tablet (100 mg total) by mouth at bedtime.        Patients skin is clean, dry and intact, no evidence of skin break down. IV site discontinued and catheter remains intact. Site without signs and symptoms of complications. Dressing and pressure applied.  Patient escorted to car by volunteer in a wheelchair,  no distress noted upon discharge.  Jadon Harbaugh C 02/23/2013 1:21 PM

## 2013-02-27 ENCOUNTER — Encounter: Payer: Self-pay | Admitting: Infectious Disease

## 2013-02-27 ENCOUNTER — Ambulatory Visit (INDEPENDENT_AMBULATORY_CARE_PROVIDER_SITE_OTHER): Payer: Medicaid Other | Admitting: Infectious Disease

## 2013-02-27 VITALS — BP 127/86 | HR 106 | Temp 97.7°F | Wt 158.0 lb

## 2013-02-27 DIAGNOSIS — B20 Human immunodeficiency virus [HIV] disease: Secondary | ICD-10-CM

## 2013-02-27 DIAGNOSIS — I7389 Other specified peripheral vascular diseases: Secondary | ICD-10-CM

## 2013-02-27 NOTE — Progress Notes (Signed)
Subjective:    Patient ID: [name], female    DOB: 08-Nov-1956, 57 y.o.   MRN: [id number]  Foot Pain Associated symptoms include joint swelling. Pertinent negatives include no abdominal pain, arthralgias, chest pain, chills, congestion, coughing, diaphoresis, fatigue, fever, myalgias, nausea, rash, sore throat, vomiting or weakness.    [name] is a 57 y.o. female with HIV infection who has been superbly well controlled on her  antiviral regimen, atripla with undetectable viral load and health cd4 count who has been found to have severe PVD and is sp angioplasty by [facility] last week during inpatient admission.  She has had improvement in her pain and now able to better move her toes on the left. Color has improved and less cool.      Review of Systems  Constitutional: Negative for fever, chills, diaphoresis, activity change, appetite change, fatigue and unexpected weight change.  HENT: Negative for congestion, sore throat, rhinorrhea, sneezing, trouble swallowing and sinus pressure.   Eyes: Negative for photophobia and visual disturbance.  Respiratory: Negative for cough, chest tightness, shortness of breath, wheezing and stridor.   Cardiovascular: Negative for chest pain, palpitations and leg swelling.  Gastrointestinal: Negative for nausea, vomiting, abdominal pain, diarrhea, constipation, blood in stool, abdominal distention and anal bleeding.  Genitourinary: Negative for dysuria, hematuria, flank pain and difficulty urinating.  Musculoskeletal: Positive for joint swelling and gait problem. Negative for myalgias, back pain and arthralgias.  Skin: Positive for color change. Negative for pallor, rash and wound.  Neurological: Negative for dizziness, tremors, weakness and light-headedness.  Hematological: Negative for adenopathy. Does not bruise/bleed easily.  Psychiatric/Behavioral: Negative for behavioral problems, confusion, sleep disturbance, dysphoric mood, decreased  concentration and agitation.       Objective:   Physical Exam  Constitutional: She is oriented to person, place, and time. She appears well-developed and well-nourished. No distress.  HENT:  Head: Normocephalic and atraumatic.  Mouth/Throat: Oropharynx is clear and moist. No oropharyngeal exudate.  Eyes: Conjunctivae and EOM are normal. Pupils are equal, round, and reactive to light. No scleral icterus.  Neck: Normal range of motion. Neck supple. No JVD present.  Cardiovascular: Normal rate, regular rhythm and normal heart sounds.  Exam reveals no gallop and no friction rub.   No murmur heard. Pulmonary/Chest: Effort normal and breath sounds normal. No respiratory distress. She has no wheezes. She has no rales. She exhibits no tenderness.  Abdominal: She exhibits no distension and no mass. There is no tenderness. There is no rebound and no guarding.  Musculoskeletal: She exhibits tenderness. She exhibits no edema.       Left ankle: She exhibits swelling. She exhibits normal range of motion, no ecchymosis and no deformity. Tenderness.       Feet:  Lymphadenopathy:    She has no cervical adenopathy.  Neurological: She is alert and oriented to person, place, and time. She has normal reflexes. She exhibits normal muscle tone. Coordination normal.  Skin: Skin is warm and dry. She is not diaphoretic. There is erythema. No pallor.  Psychiatric: She has a normal mood and affect. Her behavior is normal. Judgment and thought content normal.          Assessment & Plan:  PVD sp . angioplasty, left popliteal artery . angioplasty, left tibial peroneal trunk and peroneal artery :   grateful to [facility] to help here. She will need to also followup with them as well as RN home MD. I will bring her back next week to examine her  again. She may ultimatlely require amputation  HIV: perfectly controlled on [place]

## 2013-03-02 ENCOUNTER — Other Ambulatory Visit: Payer: Self-pay | Admitting: Licensed Clinical Social Worker

## 2013-03-02 ENCOUNTER — Telehealth: Payer: Self-pay | Admitting: Infectious Disease

## 2013-03-02 DIAGNOSIS — A4101 Sepsis due to Methicillin susceptible Staphylococcus aureus: Secondary | ICD-10-CM

## 2013-03-02 LAB — WOUND CULTURE

## 2013-03-02 MED ORDER — AMOXICILLIN-POT CLAVULANATE 875-125 MG PO TABS: 1.0000 | ORAL_TABLET | Freq: Two times a day (BID) | ORAL | Status: DC

## 2013-03-02 NOTE — Telephone Encounter (Signed)
I called and spoke with the nurse and she asked me to call it in to [facility] which is on the patient's profile already. I called it in today.

## 2013-03-02 NOTE — Telephone Encounter (Signed)
Pt grew MSSA from foot wound. Will place on augmentin bid for 10 days  Tamika can you call [facility] facility and have them start augmentin 875/125 bid for 10 days?

## 2013-03-06 ENCOUNTER — Encounter: Payer: Self-pay | Admitting: Infectious Disease

## 2013-03-06 ENCOUNTER — Ambulatory Visit (INDEPENDENT_AMBULATORY_CARE_PROVIDER_SITE_OTHER): Payer: Medicaid Other | Admitting: Infectious Disease

## 2013-03-06 VITALS — BP 112/76 | HR 98 | Temp 97.5°F | Wt 156.0 lb

## 2013-03-06 DIAGNOSIS — L0291 Cutaneous abscess, unspecified: Secondary | ICD-10-CM

## 2013-03-06 DIAGNOSIS — B20 Human immunodeficiency virus [HIV] disease: Secondary | ICD-10-CM

## 2013-03-06 DIAGNOSIS — L0390 Cellulitis, unspecified: Secondary | ICD-10-CM

## 2013-03-06 DIAGNOSIS — A4901 Methicillin susceptible Staphylococcus aureus infection, unspecified site: Secondary | ICD-10-CM

## 2013-03-06 DIAGNOSIS — I739 Peripheral vascular disease, unspecified: Secondary | ICD-10-CM

## 2013-03-06 NOTE — Progress Notes (Signed)
Subjective:    Patient ID: [name], female    DOB: [birth year], 57 y.o.   MRN: [id number]  Foot Pain Associated symptoms include joint swelling. Pertinent negatives include no abdominal pain, arthralgias, chest pain, chills, congestion, coughing, diaphoresis, fatigue, fever, myalgias, nausea, rash, sore throat, vomiting or weakness.    [name] is a 57 y.o. female with HIV infection who has been superbly well controlled on her  antiviral regimen, atripla with undetectable viral load and health cd4 count who has been found to have severe PVD and is sp [facility] by [facility]. I saw her last weeka nd cultured from between 2 of her toes which yielded MSSA and I put her on a 10 day course of augmentin.  She returns for follow-up today and her right foot is improved. Her left foot still is very painful and is cyanotic but stable She does NOT have significant drainage in this foot.     Review of Systems  Constitutional: Negative for fever, chills, diaphoresis, activity change, appetite change, fatigue and unexpected weight change.  HENT: Negative for congestion, sore throat, rhinorrhea, sneezing, trouble swallowing and sinus pressure.   Eyes: Negative for photophobia and visual disturbance.  Respiratory: Negative for cough, chest tightness, shortness of breath, wheezing and stridor.   Cardiovascular: Negative for chest pain, palpitations and leg swelling.  Gastrointestinal: Negative for nausea, vomiting, abdominal pain, diarrhea, constipation, blood in stool, abdominal distention and anal bleeding.  Genitourinary: Negative for dysuria, hematuria, flank pain and difficulty urinating.  Musculoskeletal: Positive for joint swelling and gait problem. Negative for myalgias, back pain and arthralgias.  Skin: Positive for color change. Negative for pallor, rash and wound.  Neurological: Negative for dizziness, tremors, weakness and light-headedness.  Hematological: Negative  for adenopathy. Does not bruise/bleed easily.  Psychiatric/Behavioral: Negative for behavioral problems, confusion, sleep disturbance, dysphoric mood, decreased concentration and agitation.       Objective:   Physical Exam  Constitutional: She is oriented to person, place, and time. She appears well-developed and well-nourished. No distress.  HENT:  Head: Normocephalic and atraumatic.  Mouth/Throat: Oropharynx is clear and moist. No oropharyngeal exudate.  Eyes: Conjunctivae and EOM are normal. No scleral icterus.  Neck: Normal range of motion. Neck supple. No JVD present.  Cardiovascular: Normal rate, regular rhythm and normal heart sounds.  Exam reveals no gallop and no friction rub.   No murmur heard. Pulmonary/Chest: Effort normal and breath sounds normal. No respiratory distress. She has no wheezes. She has no rales. She exhibits no tenderness.  Abdominal: She exhibits no distension and no mass. There is no tenderness. There is no rebound and no guarding.  Musculoskeletal: She exhibits tenderness. She exhibits no edema.       Left ankle: She exhibits swelling. She exhibits normal range of motion, no ecchymosis and no deformity. Tenderness.       Feet:  Lymphadenopathy:    She has no cervical adenopathy.  Neurological: She is alert and oriented to person, place, and time. She exhibits normal muscle tone. Coordination normal.  Skin: Skin is warm and dry. She is not diaphoretic. There is erythema. No pallor.  Psychiatric: She has a normal mood and affect. Her behavior is normal. Judgment and thought content normal.          Assessment & Plan:  PVD sp . angioplasty, left popliteal artery . angioplasty, left tibial peroneal trunk and peroneal artery : grateful to [facility] to help here. She will need to also followup with  them as well as  In SNF facility.   She may ultimatlely require amputation  HIV: perfectly controlled on atripla, rtc in 3-4 months  ? MSSA Superinfection of toes  I gave 10 day course of augmentin when I cultured exudate a week  ago  I dont think this is overtly infected the principal problem is vascular supply here.

## 2013-03-09 ENCOUNTER — Encounter: Payer: Self-pay | Admitting: *Deleted

## 2013-03-15 ENCOUNTER — Encounter: Payer: Self-pay | Admitting: Internal Medicine

## 2013-03-15 ENCOUNTER — Ambulatory Visit (INDEPENDENT_AMBULATORY_CARE_PROVIDER_SITE_OTHER): Payer: Medicaid Other | Admitting: Internal Medicine

## 2013-03-15 ENCOUNTER — Telehealth: Payer: Self-pay | Admitting: *Deleted

## 2013-03-15 VITALS — BP 128/87 | HR 101 | Temp 98.1°F | Ht 64.0 in | Wt 158.0 lb

## 2013-03-15 DIAGNOSIS — M79672 Pain in left foot: Secondary | ICD-10-CM

## 2013-03-15 DIAGNOSIS — M79609 Pain in unspecified limb: Secondary | ICD-10-CM

## 2013-03-15 MED ORDER — DOXYCYCLINE HYCLATE 100 MG PO TABS: 100.0000 mg | ORAL_TABLET | Freq: Two times a day (BID) | ORAL | Status: DC

## 2013-03-15 MED ORDER — CEPHALEXIN 500 MG PO CAPS: 500.0000 mg | ORAL_CAPSULE | Freq: Four times a day (QID) | ORAL | Status: DC

## 2013-03-15 NOTE — Telephone Encounter (Signed)
[name] from nursing home where patient is living advised that the Left foot site that Dr Daiva Eves saw her for last month is not red swollen and painful. It is warm to the touch and she is having trouble walking on it and she is vomiting and has started a low grade fever. She advised the patient wants to see Dr Daiva Eves today. Luanna Cole that Dr Daiva Eves is not available today but could try to get her in with another clinic doctor if she will see them and she advised give first available. After checking the schedule and getting the ok of office manager Hopkins gave them an appt for today at 145 pm with Dr Luciana Axe.

## 2013-03-15 NOTE — Progress Notes (Signed)
  Subjective:    Patient ID: [name], female    DOB: 30-Jun-1956, 57 y.o.   MRN: [id number]  Foot Pain Associated symptoms include abdominal pain, a fever, nausea and vomiting. Pertinent negatives include no chills or headaches.   [name] comes in for followup of her possible foot infection. [name] was last seen by Dr. Algis Liming recently for what was thought to be of foot infection though he was more concerned with vascular problems rather than infection. However he did do a culture which grew MSSA and did start her on Augmentin hoping there would be some effect with this. [name] has had some issues with nausea, vomiting and diarrhea. [name] also has had subjective fever and otherwise continues to have significant pain in her left foot and some swelling. The pain essentially has never resolved no matter what [name] does.   Review of Systems  Constitutional: Positive for fever and activity change. Negative for chills.  Gastrointestinal: Positive for nausea, vomiting, abdominal pain and diarrhea.  Neurological: Negative for dizziness and headaches.       Objective:   Physical Exam  Constitutional: [name] appears well-developed and well-nourished. No distress.  Skin:  Left foot with no significant erythema, left second toe with erosion of the nailbed the note discharge and is dry. No warmth, mild edema.          Assessment & Plan:

## 2013-03-15 NOTE — Assessment & Plan Note (Signed)
She has had little improvement with the antibiotics and I doubt that it is infection that is the problem. However I'm going to have her stop Augmentin and given broad coverage with doxycycline and Keflex and I'm going to give it a week. If she has no improvement with the pain or swelling in going to refer her back to vascular surgery since she likely will need more surgical intervention as it is not related to infection. She will followup here next week. Patient is in agreement with the plan. I did congratulate her on quitting smoking and encouraged continued smoking cessation.

## 2013-03-20 ENCOUNTER — Emergency Department (HOSPITAL_COMMUNITY)
Admission: EM | Admit: 2013-03-20 | Discharge: 2013-03-20 | Disposition: A | Discharge status: Home / Self Care | Payer: Medicaid Other | Attending: Emergency Medicine | Admitting: Emergency Medicine

## 2013-03-20 ENCOUNTER — Encounter (HOSPITAL_COMMUNITY): Payer: Self-pay | Admitting: Emergency Medicine

## 2013-03-20 DIAGNOSIS — F3289 Other specified depressive episodes: Secondary | ICD-10-CM | POA: Insufficient documentation

## 2013-03-20 DIAGNOSIS — Z8719 Personal history of other diseases of the digestive system: Secondary | ICD-10-CM | POA: Insufficient documentation

## 2013-03-20 DIAGNOSIS — Z79899 Other long term (current) drug therapy: Secondary | ICD-10-CM | POA: Insufficient documentation

## 2013-03-20 DIAGNOSIS — I10 Essential (primary) hypertension: Secondary | ICD-10-CM | POA: Insufficient documentation

## 2013-03-20 DIAGNOSIS — K219 Gastro-esophageal reflux disease without esophagitis: Secondary | ICD-10-CM | POA: Insufficient documentation

## 2013-03-20 DIAGNOSIS — G8929 Other chronic pain: Secondary | ICD-10-CM | POA: Insufficient documentation

## 2013-03-20 DIAGNOSIS — I4581 Long QT syndrome: Secondary | ICD-10-CM | POA: Insufficient documentation

## 2013-03-20 DIAGNOSIS — Z7982 Long term (current) use of aspirin: Secondary | ICD-10-CM | POA: Insufficient documentation

## 2013-03-20 DIAGNOSIS — Z794 Long term (current) use of insulin: Secondary | ICD-10-CM | POA: Insufficient documentation

## 2013-03-20 DIAGNOSIS — F329 Major depressive disorder, single episode, unspecified: Secondary | ICD-10-CM | POA: Insufficient documentation

## 2013-03-20 DIAGNOSIS — Z8673 Personal history of transient ischemic attack (TIA), and cerebral infarction without residual deficits: Secondary | ICD-10-CM | POA: Insufficient documentation

## 2013-03-20 DIAGNOSIS — R109 Unspecified abdominal pain: Secondary | ICD-10-CM | POA: Insufficient documentation

## 2013-03-20 DIAGNOSIS — Z8659 Personal history of other mental and behavioral disorders: Secondary | ICD-10-CM | POA: Insufficient documentation

## 2013-03-20 DIAGNOSIS — Z87891 Personal history of nicotine dependence: Secondary | ICD-10-CM | POA: Insufficient documentation

## 2013-03-20 DIAGNOSIS — Z21 Asymptomatic human immunodeficiency virus [HIV] infection status: Secondary | ICD-10-CM | POA: Insufficient documentation

## 2013-03-20 DIAGNOSIS — R1110 Vomiting, unspecified: Secondary | ICD-10-CM | POA: Insufficient documentation

## 2013-03-20 DIAGNOSIS — J45909 Unspecified asthma, uncomplicated: Secondary | ICD-10-CM | POA: Insufficient documentation

## 2013-03-20 DIAGNOSIS — E119 Type 2 diabetes mellitus without complications: Secondary | ICD-10-CM | POA: Insufficient documentation

## 2013-03-20 DIAGNOSIS — Z8669 Personal history of other diseases of the nervous system and sense organs: Secondary | ICD-10-CM | POA: Insufficient documentation

## 2013-03-20 DIAGNOSIS — Z8679 Personal history of other diseases of the circulatory system: Secondary | ICD-10-CM | POA: Insufficient documentation

## 2013-03-20 DIAGNOSIS — R9431 Abnormal electrocardiogram [ECG] [EKG]: Secondary | ICD-10-CM

## 2013-03-20 DIAGNOSIS — Z8619 Personal history of other infectious and parasitic diseases: Secondary | ICD-10-CM | POA: Insufficient documentation

## 2013-03-20 DIAGNOSIS — R197 Diarrhea, unspecified: Secondary | ICD-10-CM | POA: Insufficient documentation

## 2013-03-20 DIAGNOSIS — I2510 Atherosclerotic heart disease of native coronary artery without angina pectoris: Secondary | ICD-10-CM | POA: Insufficient documentation

## 2013-03-20 DIAGNOSIS — M129 Arthropathy, unspecified: Secondary | ICD-10-CM | POA: Insufficient documentation

## 2013-03-20 LAB — URINALYSIS, ROUTINE W REFLEX MICROSCOPIC
Glucose, UA: 100 mg/dL — AB
Hgb urine dipstick: NEGATIVE
Specific Gravity, Urine: 1.025 (ref 1.005–1.030)
Urobilinogen, UA: 1 mg/dL (ref 0.0–1.0)
pH: 6 (ref 5.0–8.0)

## 2013-03-20 LAB — CBC WITH DIFFERENTIAL/PLATELET
Basophils Relative: 0 % (ref 0–1)
Eosinophils Absolute: 0.1 10*3/uL (ref 0.0–0.7)
Eosinophils Relative: 1 % (ref 0–5)
HCT: 42.8 % (ref 36.0–46.0)
Hemoglobin: 14.1 g/dL (ref 12.0–15.0)
Lymphs Abs: 2.4 10*3/uL (ref 0.7–4.0)
MCH: 30.5 pg (ref 26.0–34.0)
MCHC: 32.9 g/dL (ref 30.0–36.0)
MCV: 92.6 fL (ref 78.0–100.0)
Monocytes Absolute: 0.8 10*3/uL (ref 0.1–1.0)
Monocytes Relative: 12 % (ref 3–12)
RBC: 4.62 MIL/uL (ref 3.87–5.11)

## 2013-03-20 LAB — COMPREHENSIVE METABOLIC PANEL
ALT: 36 U/L — ABNORMAL HIGH (ref 0–35)
AST: 43 U/L — ABNORMAL HIGH (ref 0–37)
Albumin: 3.5 g/dL (ref 3.5–5.2)
CO2: 22 mEq/L (ref 19–32)
Calcium: 9.6 mg/dL (ref 8.4–10.5)
Chloride: 101 mEq/L (ref 96–112)
Creatinine, Ser: 0.9 mg/dL (ref 0.50–1.10)
Sodium: 136 mEq/L (ref 135–145)

## 2013-03-20 LAB — URINE MICROSCOPIC-ADD ON

## 2013-03-20 LAB — LACTIC ACID, PLASMA: Lactic Acid, Venous: 2.5 mmol/L — ABNORMAL HIGH (ref 0.5–2.2)

## 2013-03-20 MED ORDER — ONDANSETRON HCL 4 MG/2ML IJ SOLN
4.0000 mg | Freq: Once | INTRAMUSCULAR | Status: AC
  Administered 2013-03-20: 4 mg via INTRAVENOUS
  Filled 2013-03-20: qty 2

## 2013-03-20 MED ORDER — MORPHINE SULFATE 2 MG/ML IJ SOLN
2.0000 mg | Freq: Once | INTRAMUSCULAR | Status: AC
  Administered 2013-03-20: 2 mg via INTRAVENOUS
  Filled 2013-03-20: qty 1

## 2013-03-20 MED ORDER — SODIUM CHLORIDE 0.9 % IV SOLN
Freq: Once | INTRAVENOUS | Status: AC
  Administered 2013-03-20: 10:00:00 via INTRAVENOUS

## 2013-03-20 NOTE — ED Notes (Signed)
Patient c/ nausea, vomiting, and diarrhea that started yesterday morning. Patient reports fever and chills.

## 2013-03-20 NOTE — ED Provider Notes (Signed)
History  This chart was scribed for Joya Gaskins, MD by Quintella Reichert, ED scribe and Bennett Scrape, ED Scribe.  This patient was seen in room APA15/[facility]15 and the patient's care was started at 9:25 AM.   CSN: [id number]  Arrival date & time 03/20/13  0911     Chief Complaint  Patient presents with  . Abdominal Pain  . Emesis  . Diarrhea     The history is provided by the patient. No language interpreter was used.    [name] is a 57 y.o. female from [facility] who presents to the Emergency Department complaining of recurrent emesis that began yesterday after attempting to swallow pills, with some associated diarrhea and moderate abdominal pain.  She states that last night she had a fever and has been unable to keep her daily medications down.  Pt denies prior episodes of present symptoms. She states that other residents of her nursing home are experiencing similar symptoms. She also reports an ongoing sore on her second left toe but denies changes or concerns. She also denies coughing, CP, SOB, dysuria, hematochezia, and melena.  Pt has swelling in feet, but denies changed associated with current complaint.  She has a h/o Hep C, HIV and HTN. She is a former smoker and alcohol user.   Past Medical History  Diagnosis Date  . Depression   . Neuropathy   . Mood disorder   . Hepatitis C   . TIA (transient ischemic attack)   . Pancreatitis chronic   . Chronic pain   . HIV (human immunodeficiency virus infection) 1990's  . Polysubstance abuse   . GERD (gastroesophageal reflux disease)   . Cardiomyopathy, nonischemic     EF 45%  . Hypertension   . Coronary artery disease   . Peripheral vascular disease   . Asthma     "used to when I was young" (02/20/2013)  . Type II diabetes mellitus   . Arthritis     "legs and arms" (02/20/2013)    Past Surgical History  Procedure Laterality Date  . Carotid endarterectomy Right     Family History  Problem Relation Age of  Onset  . Coronary artery disease Father 34    History  Substance Use Topics  . Smoking status: Former Smoker -- 0.30 packs/day for 40 years    Types: Cigarettes    Quit date: 02/23/2013  . Smokeless tobacco: Never Used     Comment: 02/20/2013 offered smoing cessation materials; pt declines  . Alcohol Use: No     Comment: 02/20/2013 "used to have problems w/alcohol; haven't drank anything in 4 yr"    OB History   Grav Para Term Preterm Abortions TAB SAB Ect Mult Living   1 1 1       1       Review of Systems  A complete 10 system review of systems was obtained and all systems are negative except as noted in the HPI and PMH.    Allergies  Review of patient's allergies indicates no known allergies.  Home Medications   Current Outpatient Rx  Name  Route  Sig  Dispense  Refill  . aspirin 81 MG EC tablet   Oral   Take 1 tablet (81 mg total) by mouth daily.   30 tablet   6     Needs to keep scheduled appts.   . cephALEXin (KEFLEX) 500 MG capsule   Oral   Take 1 capsule (500 mg total) by mouth  4 (four) times daily.   56 capsule   0   . cetirizine (ZYRTEC) 10 MG tablet   Oral   Take 10 mg by mouth daily.         Marland Kitchen desipramine (NORPRAMIN) 25 MG tablet   Oral   Take 25 mg by mouth at bedtime.         . docusate sodium (COLACE) 100 MG capsule   Oral   Take 100 mg by mouth daily.          Marland Kitchen doxycycline (VIBRA-TABS) 100 MG tablet   Oral   Take 1 tablet (100 mg total) by mouth 2 (two) times daily.   28 tablet   0   . efavirenz-emtricitabine-tenofovir (ATRIPLA) 600-200-300 MG per tablet   Oral   Take 1 tablet by mouth at bedtime.          . ferrous sulfate 325 (65 FE) MG tablet      TAKE ONE TABLET BY MOUTH ONCE DAILY.   30 tablet   6   . glucose blood (ACCU-CHEK COMPACT TEST DRUM) test strip      Use to check blood sugar before each meal, at bedtime and when patient has symptoms of low blood sugar   153 each   12   . HYDROcodone-acetaminophen  (NORCO/VICODIN) 5-325 MG per tablet   Oral   Take 1 tablet by mouth every 6 (six) hours as needed for pain (left foot pain). pain   30 tablet   0   . insulin aspart (NOVOLOG) 100 UNIT/ML injection   Subcutaneous   Inject 10 Units into the skin 3 (three) times daily before meals. Inject 10 units plus 1 unit for every 30 MG/DL above 161 MG/DL subcutaneously at meals.   1 vial   0   . insulin glargine (LANTUS) 100 UNIT/ML injection   Subcutaneous   Inject 45 Units into the skin at bedtime.          . Insulin Pen Needle 31G X 8 MM MISC      Use to inject insulin at meal time and SSI   100 each   3   . Lancet Devices (ACCU-CHEK SOFTCLIX) lancets      Use to check blood sugar before each meal, at bedtime and when patient has symptoms of low blood sugar   200 each   11   . metoprolol succinate (TOPROL-XL) 50 MG 24 hr tablet   Oral   Take 1 tablet (50 mg total) by mouth daily. Take with or immediately following a meal.   30 tablet   4     New   . mirtazapine (REMERON) 15 MG tablet   Oral   Take 15 mg by mouth at bedtime.         . Multiple Vitamin (MULTIVITAMIN WITH MINERALS) TABS   Oral   Take 1 tablet by mouth daily.         . nicotine (NICODERM CQ - DOSED IN MG/24 HR) 7 mg/24hr patch   Transdermal   Place 1 patch onto the skin daily.   28 patch   0   . omeprazole (PRILOSEC) 20 MG capsule   Oral   Take 1 capsule (20 mg total) by mouth daily.   30 capsule   6     Needs to keep scheduled appts.   . Pancrelipase, Lip-Prot-Amyl, (CREON) 6000 UNITS CPEP   Oral   Take 12,000 Units by mouth 3 (three) times daily. Patient takes  2 capsules three times daily with meals and 1 capsule three times daily with snacks         . polyethylene glycol (MIRALAX) powder   Oral   Take 17 g by mouth daily as needed. constipation         . pregabalin (LYRICA) 100 MG capsule   Oral   Take 100 mg by mouth 3 (three) times daily.         . traZODone (DESYREL) 100 MG  tablet   Oral   Take 1 tablet (100 mg total) by mouth at bedtime.   30 tablet   3     BP 106/62  Pulse 104  Temp(Src) 97.6 F (36.4 C) (Oral)  Resp 22  Ht 5\' 4"  (1.626 m)  Wt 158 lb (71.668 kg)  BMI 27.11 kg/m2  SpO2 100%  Physical Exam  Nursing note and vitals reviewed.  CONSTITUTIONAL: Well developed/well nourished HEAD: Normocephalic/atraumatic EYES: EOMI/PERRL, no icterus ENMT: Mucous membranes moist NECK: supple no meningeal signs SPINE:entire spine nontender CV: S1/S2 noted, no murmurs/rubs/gallops noted LUNGS: Lungs are clear to auscultation bilaterally, no apparent distress ABDOMEN: soft, nontender, no rebound or guarding GU:no cva tenderness NEURO: Pt is awake/alert, moves all extremitiesx4 EXTREMITIES: pulses normal, full ROM, left second toe is bandaged  SKIN: warm, color normal PSYCH: no abnormalities of mood noted  ED Course  Procedures (including critical care time)  Oxygen Saturation is 100% on room air, normal by my interpretation.    COORDINATION OF CARE:  9:31 AM-Discussed treatment plan which includes CBC, CMP, UA, anti-emetic and pain medication with pt at bedside and pt agreed to plan.    Pt after improved after meds in the ED Her abdomen was soft and she was taking PO She appears improved Given vomiting/diarrhea and exposure to illness in nursing facility likely viral process Stable for d/c meds stopped due to prolonged qt     Labs Reviewed  COMPREHENSIVE METABOLIC PANEL - Abnormal; Notable for the following:    Glucose, Bld 139 (*)    BUN 26 (*)    Total Protein 8.7 (*)    AST 43 (*)    ALT 36 (*)    GFR calc non Af Amer 70 (*)    GFR calc Af Amer 81 (*)    All other components within normal limits  LACTIC ACID, PLASMA - Abnormal; Notable for the following:    Lactic Acid, Venous 2.5 (*)    All other components within normal limits  URINALYSIS, ROUTINE W REFLEX MICROSCOPIC - Abnormal; Notable for the following:    Glucose, UA  100 (*)    Ketones, ur 15 (*)    Protein, ur 30 (*)    All other components within normal limits  URINE MICROSCOPIC-ADD ON - Abnormal; Notable for the following:    Bacteria, UA FEW (*)    All other components within normal limits  CBC WITH DIFFERENTIAL  LIPASE, BLOOD  TROPONIN I   No results found.   1. Prolonged Q-T interval on ECG       MDM  Nursing notes including past medical history and social history reviewed and considered in documentation Labs/vital reviewed and considered Previous records reviewed and considered      Date: 03/20/2013  Rate: 95  Rhythm: normal sinus rhythm  QRS Axis: normal  Intervals: QT prolonged  ST/T Wave abnormalities: nonspecific ST changes  Conduction Disutrbances:none  Narrative Interpretation:   Old EKG Reviewed: unchanged     I personally performed  the services described in this documentation, which was scribed in my presence. The recorded information has been reviewed and is accurate.          Joya Gaskins, MD 03/20/13 [phone]

## 2013-03-20 NOTE — Discharge Instructions (Signed)
Please hold your norpramin and your trazodone until you see your physician in followup due to ekg changes  Abdominal (belly) pain can be caused by many things. any cases can be observed and treated at home after initial evaluation in the emergency department. Even though you are being discharged home, abdominal pain can be unpredictable. Therefore, you need a repeated exam if your pain does not resolve, returns, or worsens. Most patients with abdominal pain don't have to be admitted to the hospital or have surgery, but serious problems like appendicitis and gallbladder attacks can start out as nonspecific pain. Many abdominal conditions cannot be diagnosed in one visit, so follow-up evaluations are very important. SEEK IMMEDIATE MEDICAL ATTENTION IF: The pain does not go away or becomes severe, particularly over the next 8-12 hours.  A temperature above 100.70F develops.  Repeated vomiting occurs (multiple episodes).  The pain becomes localized to portions of the abdomen. The right side could possibly be appendicitis. In an adult, the left lower portion of the abdomen could be colitis or diverticulitis.  Blood is being passed in stools or vomit (bright red or black tarry stools).  Return also if you develop chest pain, difficulty breathing, dizziness or fainting, or become confused, poorly responsive

## 2013-03-20 NOTE — ED Notes (Signed)
Spoke with Darel Hong from [facility] regarding pt's d/c papers and STOP meds.  Verbalized understanding and will send transportation.

## 2013-03-21 ENCOUNTER — Ambulatory Visit (HOSPITAL_COMMUNITY): Payer: Medicaid Other | Admitting: Physical Therapy

## 2013-03-22 ENCOUNTER — Ambulatory Visit (INDEPENDENT_AMBULATORY_CARE_PROVIDER_SITE_OTHER): Payer: Medicaid Other | Admitting: Infectious Diseases

## 2013-03-22 ENCOUNTER — Ambulatory Visit: Payer: Medicaid Other | Admitting: Internal Medicine

## 2013-03-22 ENCOUNTER — Encounter: Payer: Self-pay | Admitting: Infectious Diseases

## 2013-03-22 VITALS — BP 120/87 | HR 120 | Temp 97.5°F | Ht 64.0 in | Wt 151.0 lb

## 2013-03-22 DIAGNOSIS — E109 Type 1 diabetes mellitus without complications: Secondary | ICD-10-CM

## 2013-03-22 DIAGNOSIS — B20 Human immunodeficiency virus [HIV] disease: Secondary | ICD-10-CM

## 2013-03-22 DIAGNOSIS — I7389 Other specified peripheral vascular diseases: Secondary | ICD-10-CM

## 2013-03-22 DIAGNOSIS — R9431 Abnormal electrocardiogram [ECG] [EKG]: Secondary | ICD-10-CM | POA: Insufficient documentation

## 2013-03-22 DIAGNOSIS — B1710 Acute hepatitis C without hepatic coma: Secondary | ICD-10-CM

## 2013-03-22 NOTE — Assessment & Plan Note (Signed)
Needs PCP

## 2013-03-22 NOTE — Assessment & Plan Note (Addendum)
Appears to be doing well. Thanks to her SNF. Will see her back in 3 months with her regular provider.

## 2013-03-22 NOTE — Progress Notes (Signed)
  Subjective:    Patient ID: [name], female    DOB: Jul 23, 1956, 57 y.o.   MRN: [id number]  HPI 57 yo F with hx of DM2, hyperlipidemia, HIV+ on atripla,Hep C,  living in SNF, seen in March and had angioplasty. She was seen in ID clinic after this for concern about infected L foot. She was treated with Augmentin, then doxy/keflex. She was seen in [facility] ED on 4-14 with abd pain (told she had a stomach virus, "a couple of [place] in the unit had it") and found as well to have prolonged QT on her ECG. Her desipramine and remeron were placed on hold due to this. She had a normal WBC but does not appear to have had any further w/u of her abd pain. Today with continued complaints about pain and swelling in her foot. Has not had vascular f/u ("they didn't call me").  No problems with her medications.    HIV 1 RNA Quant (copies/mL)  Date Value  02/13/2013 <20   11/02/2012 <20   08/15/2012 <20      CD4 T Cell Abs (cmm)  Date Value  02/13/2013 550   11/02/2012 590   08/15/2012 710      Review of Systems  Constitutional: Negative for fever and chills.  Respiratory: Negative for shortness of breath.   Cardiovascular: Negative for chest pain.  Gastrointestinal: Negative for diarrhea and constipation.  Genitourinary: Negative for difficulty urinating.  Neurological: Negative for numbness.       Objective:   Physical Exam  Constitutional: She appears well-developed and well-nourished.  HENT:  Mouth/Throat: No oropharyngeal exudate.  Eyes: EOM are normal. Pupils are equal, round, and reactive to light.  Neck: Neck supple.  Cardiovascular: Normal rate, regular rhythm and normal heart sounds.   Pulmonary/Chest: Effort normal and breath sounds normal.  Abdominal: Soft. Bowel sounds are normal. There is no tenderness.  Musculoskeletal:       Feet:  Lymphadenopathy:    She has no cervical adenopathy.          Assessment & Plan:

## 2013-03-22 NOTE — Assessment & Plan Note (Signed)
Needs to get back in with vascular, asap

## 2013-03-22 NOTE — Assessment & Plan Note (Signed)
Will continue to follow her LFTs, Hep A + previously.

## 2013-03-22 NOTE — Assessment & Plan Note (Signed)
Will get her back in with her cardiologist asap.

## 2013-03-28 ENCOUNTER — Emergency Department (HOSPITAL_COMMUNITY)
Admission: EM | Admit: 2013-03-28 | Discharge: 2013-03-28 | Disposition: A | Discharge status: Skilled Nursing Facility | Payer: Medicaid Other | Attending: Emergency Medicine | Admitting: Emergency Medicine

## 2013-03-28 ENCOUNTER — Ambulatory Visit (HOSPITAL_COMMUNITY): Payer: Medicaid Other | Admitting: Physical Therapy

## 2013-03-28 ENCOUNTER — Telehealth: Payer: Self-pay | Admitting: Surgery

## 2013-03-28 ENCOUNTER — Encounter (HOSPITAL_COMMUNITY): Payer: Self-pay | Admitting: *Deleted

## 2013-03-28 ENCOUNTER — Emergency Department (HOSPITAL_COMMUNITY): Payer: Medicaid Other

## 2013-03-28 DIAGNOSIS — Z8679 Personal history of other diseases of the circulatory system: Secondary | ICD-10-CM | POA: Insufficient documentation

## 2013-03-28 DIAGNOSIS — E119 Type 2 diabetes mellitus without complications: Secondary | ICD-10-CM | POA: Insufficient documentation

## 2013-03-28 DIAGNOSIS — K219 Gastro-esophageal reflux disease without esophagitis: Secondary | ICD-10-CM | POA: Insufficient documentation

## 2013-03-28 DIAGNOSIS — S91109A Unspecified open wound of unspecified toe(s) without damage to nail, initial encounter: Secondary | ICD-10-CM | POA: Insufficient documentation

## 2013-03-28 DIAGNOSIS — Z79899 Other long term (current) drug therapy: Secondary | ICD-10-CM | POA: Insufficient documentation

## 2013-03-28 DIAGNOSIS — R112 Nausea with vomiting, unspecified: Secondary | ICD-10-CM | POA: Insufficient documentation

## 2013-03-28 DIAGNOSIS — J45909 Unspecified asthma, uncomplicated: Secondary | ICD-10-CM | POA: Insufficient documentation

## 2013-03-28 DIAGNOSIS — Z21 Asymptomatic human immunodeficiency virus [HIV] infection status: Secondary | ICD-10-CM | POA: Insufficient documentation

## 2013-03-28 DIAGNOSIS — F329 Major depressive disorder, single episode, unspecified: Secondary | ICD-10-CM | POA: Insufficient documentation

## 2013-03-28 DIAGNOSIS — Z87891 Personal history of nicotine dependence: Secondary | ICD-10-CM | POA: Insufficient documentation

## 2013-03-28 DIAGNOSIS — Z8619 Personal history of other infectious and parasitic diseases: Secondary | ICD-10-CM | POA: Insufficient documentation

## 2013-03-28 DIAGNOSIS — Z8673 Personal history of transient ischemic attack (TIA), and cerebral infarction without residual deficits: Secondary | ICD-10-CM | POA: Insufficient documentation

## 2013-03-28 DIAGNOSIS — R197 Diarrhea, unspecified: Secondary | ICD-10-CM | POA: Insufficient documentation

## 2013-03-28 DIAGNOSIS — I2510 Atherosclerotic heart disease of native coronary artery without angina pectoris: Secondary | ICD-10-CM | POA: Insufficient documentation

## 2013-03-28 DIAGNOSIS — R109 Unspecified abdominal pain: Secondary | ICD-10-CM

## 2013-03-28 DIAGNOSIS — Z8739 Personal history of other diseases of the musculoskeletal system and connective tissue: Secondary | ICD-10-CM | POA: Insufficient documentation

## 2013-03-28 DIAGNOSIS — IMO0001 Reserved for inherently not codable concepts without codable children: Secondary | ICD-10-CM | POA: Insufficient documentation

## 2013-03-28 DIAGNOSIS — F3289 Other specified depressive episodes: Secondary | ICD-10-CM | POA: Insufficient documentation

## 2013-03-28 DIAGNOSIS — I10 Essential (primary) hypertension: Secondary | ICD-10-CM | POA: Insufficient documentation

## 2013-03-28 DIAGNOSIS — Z86718 Personal history of other venous thrombosis and embolism: Secondary | ICD-10-CM | POA: Insufficient documentation

## 2013-03-28 DIAGNOSIS — Z7982 Long term (current) use of aspirin: Secondary | ICD-10-CM | POA: Insufficient documentation

## 2013-03-28 DIAGNOSIS — Z794 Long term (current) use of insulin: Secondary | ICD-10-CM | POA: Insufficient documentation

## 2013-03-28 DIAGNOSIS — G8929 Other chronic pain: Secondary | ICD-10-CM | POA: Insufficient documentation

## 2013-03-28 LAB — URINALYSIS, ROUTINE W REFLEX MICROSCOPIC
Glucose, UA: 250 mg/dL — AB
Hgb urine dipstick: NEGATIVE
Ketones, ur: 15 mg/dL — AB
Leukocytes, UA: NEGATIVE
pH: 6 (ref 5.0–8.0)

## 2013-03-28 LAB — URINE MICROSCOPIC-ADD ON

## 2013-03-28 LAB — COMPREHENSIVE METABOLIC PANEL
ALT: 38 U/L — ABNORMAL HIGH (ref 0–35)
AST: 49 U/L — ABNORMAL HIGH (ref 0–37)
Albumin: 3.3 g/dL — ABNORMAL LOW (ref 3.5–5.2)
Chloride: 102 mEq/L (ref 96–112)
Creatinine, Ser: 0.92 mg/dL (ref 0.50–1.10)
Potassium: 3.6 mEq/L (ref 3.5–5.1)
Sodium: 137 mEq/L (ref 135–145)
Total Bilirubin: 0.3 mg/dL (ref 0.3–1.2)

## 2013-03-28 LAB — CBC WITH DIFFERENTIAL/PLATELET
Basophils Absolute: 0.1 10*3/uL (ref 0.0–0.1)
Basophils Relative: 1 % (ref 0–1)
Lymphocytes Relative: 42 % (ref 12–46)
MCHC: 34.5 g/dL (ref 30.0–36.0)
Monocytes Absolute: 0.8 10*3/uL (ref 0.1–1.0)
Neutro Abs: 3.3 10*3/uL (ref 1.7–7.7)
Neutrophils Relative %: 46 % (ref 43–77)
Platelets: 257 10*3/uL (ref 150–400)
RDW: 13.1 % (ref 11.5–15.5)
WBC: 7.2 10*3/uL (ref 4.0–10.5)

## 2013-03-28 MED ORDER — SODIUM CHLORIDE 0.9 % IV BOLUS (SEPSIS)
1000.0000 mL | Freq: Once | INTRAVENOUS | Status: AC
  Administered 2013-03-28: 1000 mL via INTRAVENOUS

## 2013-03-28 MED ORDER — MORPHINE SULFATE 4 MG/ML IJ SOLN
4.0000 mg | Freq: Once | INTRAMUSCULAR | Status: AC
  Administered 2013-03-28: 4 mg via INTRAVENOUS
  Filled 2013-03-28: qty 1

## 2013-03-28 MED ORDER — IOHEXOL 300 MG/ML  SOLN
100.0000 mL | Freq: Once | INTRAMUSCULAR | Status: AC | PRN
  Administered 2013-03-28: 100 mL via INTRAVENOUS

## 2013-03-28 MED ORDER — IOHEXOL 300 MG/ML  SOLN
50.0000 mL | Freq: Once | INTRAMUSCULAR | Status: AC | PRN
  Administered 2013-03-28: 50 mL via ORAL

## 2013-03-28 MED ORDER — ONDANSETRON HCL 4 MG/2ML IJ SOLN
4.0000 mg | Freq: Once | INTRAMUSCULAR | Status: AC
  Administered 2013-03-28: 4 mg via INTRAVENOUS
  Filled 2013-03-28: qty 2

## 2013-03-28 MED ORDER — ONDANSETRON HCL 8 MG PO TABS: 8.0000 mg | ORAL_TABLET | Freq: Three times a day (TID) | ORAL | Status: DC | PRN

## 2013-03-28 NOTE — ED Notes (Signed)
Pt with abd pain and nausea since Sunday night, denies diarrhea or vomiting

## 2013-03-28 NOTE — ED Provider Notes (Signed)
History  This chart was scribed for [name], MD by Ardeen Jourdain, ED Scribe. This patient was seen in room APA02/APA02 and the patient's care was started at 1300.  CSN: [id number]  Arrival date & time 03/28/13  1222   First MD Initiated Contact with Patient 03/28/13 1300      Chief Complaint  Patient presents with  . Nausea  . Abdominal Pain     The history is provided by the patient. No language interpreter was used.    [name] is a 57 y.o. female with a h/o HIV, DM, TIA and chronic pancreatitis who presents to the Emergency Department complaining of gradual onset, gradually worsening, constant abdominal pain with associated nausea that began 3 days ago. Pt denies any diarrhea or emesis today. She states she had emesis and diarrhea 2 days ago and yesterday. She locates the pain to her generalized abdomen. She denies any blood in her stool or emesis. She states she has been unable to eat for 3 days. Pt states she was evaluated 4/14 for similar symptoms and was diagnosed with a virus.   Past Medical History  Diagnosis Date  . Depression   . Neuropathy   . Mood disorder   . Hepatitis C   . TIA (transient ischemic attack)   . Pancreatitis chronic   . Chronic pain   . HIV (human immunodeficiency virus infection) 1990's  . Polysubstance abuse   . GERD (gastroesophageal reflux disease)   . Cardiomyopathy, nonischemic     EF 45%  . Hypertension   . Coronary artery disease   . Peripheral vascular disease   . Asthma     "used to when I was young" (02/20/2013)  . Type II diabetes mellitus   . Arthritis     "legs and arms" (02/20/2013)    Past Surgical History  Procedure Laterality Date  . Carotid endarterectomy Right     Family History  Problem Relation Age of Onset  . Coronary artery disease Father 52    History  Substance Use Topics  . Smoking status: Former Smoker -- 0.30 packs/day for 40 years    Types: Cigarettes    Quit date: 02/23/2013  . Smokeless  tobacco: Never Used     Comment: 02/20/2013 offered smoing cessation materials; pt declines  . Alcohol Use: No     Comment: 02/20/2013 "used to have problems w/alcohol; haven't drank anything in 4 yr"    OB History   Grav Para Term Preterm Abortions TAB SAB Ect Mult Living   1 1 1       1       Review of Systems  Constitutional: Negative for fever and chills.  Respiratory: Negative for shortness of breath.   Cardiovascular: Negative for chest pain.  Gastrointestinal: Positive for nausea, vomiting, abdominal pain and diarrhea. Negative for blood in stool and anal bleeding.  Musculoskeletal: Negative for back pain.  Neurological: Negative for weakness.  All other systems reviewed and are negative.    Allergies  Review of patient's allergies indicates no known allergies.  Home Medications   Current Outpatient Rx  Name  Route  Sig  Dispense  Refill  . aspirin 81 MG EC tablet   Oral   Take 1 tablet (81 mg total) by mouth daily.   30 tablet   6     Needs to keep scheduled appts.   Marland Kitchen atorvastatin (LIPITOR) 10 MG tablet   Oral   Take 10 mg by mouth at bedtime.         Marland Kitchen  cephALEXin (KEFLEX) 500 MG capsule   Oral   Take 1 capsule (500 mg total) by mouth 4 (four) times daily.   56 capsule   0   . cetirizine (ZYRTEC) 10 MG tablet   Oral   Take 10 mg by mouth daily.         . colchicine 0.6 MG tablet   Oral   Take 0.6 mg by mouth 2 (two) times daily. Take three tablets once then one tablet one hour later and continue this regimen for 5 days then one tablet BID         . desipramine (NORPRAMIN) 25 MG tablet   Oral   Take 25 mg by mouth at bedtime.         . docusate sodium (COLACE) 100 MG capsule   Oral   Take 100 mg by mouth daily.          Marland Kitchen doxycycline (VIBRA-TABS) 100 MG tablet   Oral   Take 100 mg by mouth 2 (two) times daily.         Marland Kitchen efavirenz-emtricitabine-tenofovir (ATRIPLA) 600-200-300 MG per tablet   Oral   Take 1 tablet by mouth at  bedtime.          . ferrous sulfate 325 (65 FE) MG tablet      TAKE ONE TABLET BY MOUTH ONCE DAILY.   30 tablet   6   . gabapentin (NEURONTIN) 100 MG capsule   Oral   Take 100 mg by mouth 3 (three) times daily.         Marland Kitchen glucose blood (ACCU-CHEK COMPACT TEST DRUM) test strip      Use to check blood sugar before each meal, at bedtime and when patient has symptoms of low blood sugar   153 each   12   . HYDROcodone-acetaminophen (NORCO/VICODIN) 5-325 MG per tablet   Oral   Take 1 tablet by mouth every 6 (six) hours as needed for pain (left foot pain). pain   30 tablet   0   . insulin aspart (NOVOLOG) 100 UNIT/ML injection   Subcutaneous   Inject 10 Units into the skin 3 (three) times daily before meals. Inject 10 units plus 1 unit for every 30 MG/DL above 960 MG/DL subcutaneously at meals.   1 vial   0   . insulin glargine (LANTUS) 100 UNIT/ML injection   Subcutaneous   Inject 45 Units into the skin at bedtime.          . Insulin Pen Needle 31G X 8 MM MISC      Use to inject insulin at meal time and SSI   100 each   3   . Lancet Devices (ACCU-CHEK SOFTCLIX) lancets      Use to check blood sugar before each meal, at bedtime and when patient has symptoms of low blood sugar   200 each   11   . metoprolol succinate (TOPROL-XL) 50 MG 24 hr tablet   Oral   Take 1 tablet (50 mg total) by mouth daily. Take with or immediately following a meal.   30 tablet   4     New   . mirtazapine (REMERON) 15 MG tablet   Oral   Take 15 mg by mouth at bedtime.         . Multiple Vitamin (MULTIVITAMIN WITH MINERALS) TABS   Oral   Take 1 tablet by mouth daily.         . nicotine (NICODERM CQ -  DOSED IN MG/24 HR) 7 mg/24hr patch   Transdermal   Place 1 patch onto the skin daily.   28 patch   0   . omeprazole (PRILOSEC) 20 MG capsule   Oral   Take 1 capsule (20 mg total) by mouth daily.   30 capsule   6     Needs to keep scheduled appts.   . Pancrelipase,  Lip-Prot-Amyl, (CREON) 6000 UNITS CPEP   Oral   Take 12,000 Units by mouth 3 (three) times daily. Patient takes 2 capsules three times daily with meals and 1 capsule three times daily with snacks         . polyethylene glycol (MIRALAX) powder   Oral   Take 17 g by mouth daily as needed. constipation         . pregabalin (LYRICA) 100 MG capsule   Oral   Take 100 mg by mouth 3 (three) times daily.         . traZODone (DESYREL) 100 MG tablet   Oral   Take 1 tablet (100 mg total) by mouth at bedtime.   30 tablet   3     Triage Vitals: BP 129/111  Pulse 124  Temp(Src) 97.7 F (36.5 C) (Oral)  Resp 21  Ht 5\' 4"  (1.626 m)  Wt 156 lb (70.761 kg)  BMI 26.76 kg/m2  SpO2 100%  Physical Exam  Nursing note and vitals reviewed. Constitutional: She is oriented to person, place, and time. She appears well-developed and well-nourished. She appears distressed.  Appears anxious and uncomfortable   HENT:  Head: Normocephalic and atraumatic.  Eyes: EOM are normal. Pupils are equal, round, and reactive to light.  Neck: Normal range of motion. Neck supple. No tracheal deviation present.  Cardiovascular: Normal rate, regular rhythm and normal heart sounds.  Exam reveals no gallop and no friction rub.   No murmur heard. Pulmonary/Chest: Effort normal and breath sounds normal. No respiratory distress. She has no wheezes. She has no rales. She exhibits no tenderness.  Abdominal: Soft. She exhibits no distension and no mass. There is tenderness. There is no rebound and no guarding.  Generalized TTP in all 4 quadrants.   Musculoskeletal: Normal range of motion. She exhibits no edema and no tenderness.  Neurological: She is alert and oriented to person, place, and time.  Skin: Skin is warm and dry. She is not diaphoretic.  Psychiatric: She has a normal mood and affect. Her behavior is normal.    ED Course  Procedures (including critical care time)  DIAGNOSTIC STUDIES: Oxygen Saturation  is 100% on room air, normal by my interpretation.    COORDINATION OF CARE:  1:16 PM-Discussed treatment plan which includes CT of the abdomen, pain medication and anti-nau with pt at bedside and pt agreed to plan.    Labs Reviewed - No data to display No results found.   No diagnosis found.    MDM  The patient presents with generalized abd pain that she had one week ago and resolved.  It returned today and she felt very nauseated.  The workup does not reveal any emergent pathology.  There is no elevation of wbc, the urine is clear and the electrolytes and lipase are normal.  The ct shows only underlying constipation but emergent process.      I personally performed the services described in this documentation, which was scribed in my presence. The recorded information has been reviewed and is accurate.      [name], MD  03/28/13 1626 

## 2013-03-28 NOTE — Telephone Encounter (Addendum)
Message copied by Rosalyn Charters on Tue Mar 28, 2013 12:51 PM ------      Message from: Nada Libman      Created: Tue Mar 28, 2013 11:24 AM       Within the month      ----- Message -----         From: Rosalyn Charters         Sent: 03/27/2013   4:09 PM           To: Nada Libman, MD            Dr. Myra Gianotti,            How urgent is this and do you want any labs done?            Kinnie Scales      ----- Message -----         From: Nada Libman, MD         Sent: 03/26/2013   8:57 PM           To: Kendal Hymen A Roux            yes      ----- Message -----         From: Rosalyn Charters         Sent: 03/23/2013   9:24 AM           To: Nada Libman, MD            Dr. Myra Gianotti,            She does not have a fu appointment scheduled.  When would you like me to schedule one?            Kinnie Scales      ----- Message -----         From: Nada Libman, MD         Sent: 03/23/2013   7:24 AM           To: Melene Plan, RN, Jeralene Peters Roux            When does this patient have follow up with me                         ------  notified [facility] of appt. on 04-24-13 at 9 am

## 2013-03-28 NOTE — ED Notes (Signed)
Patient reports that she was here at the ED on 03/20/2013 and had a diagnosis of a stomach virus, after which she got better. Patient reports that on Sunday she ate something then felt sick and has gotten worse since Sunday. Reports nausea and vomiting on Sunday and Monday, but not today.

## 2013-03-31 ENCOUNTER — Other Ambulatory Visit: Payer: Self-pay | Admitting: Licensed Clinical Social Worker

## 2013-03-31 DIAGNOSIS — Z716 Tobacco abuse counseling: Secondary | ICD-10-CM

## 2013-03-31 MED ORDER — NICOTINE 7 MG/24HR TD PT24: 1.0000 | MEDICATED_PATCH | TRANSDERMAL | Status: DC

## 2013-04-03 ENCOUNTER — Other Ambulatory Visit: Payer: Self-pay | Admitting: Infectious Disease

## 2013-04-04 ENCOUNTER — Ambulatory Visit (HOSPITAL_COMMUNITY)
Admission: RE | Admit: 2013-04-04 | Payer: Medicaid Other | Source: Ambulatory Visit | Attending: *Deleted | Admitting: *Deleted

## 2013-04-04 ENCOUNTER — Telehealth: Payer: Self-pay | Admitting: *Deleted

## 2013-04-04 ENCOUNTER — Ambulatory Visit (HOSPITAL_COMMUNITY)
Admission: RE | Admit: 2013-04-04 | Discharge: 2013-04-04 | Disposition: A | Discharge status: Home / Self Care | Payer: Medicaid Other | Source: Ambulatory Visit | Attending: Infectious Disease | Admitting: Infectious Disease

## 2013-04-04 NOTE — Progress Notes (Addendum)
Physical Therapy - Wound Therapy/ Medicaid  Evaluation   Patient Details  Name: [name] MRN: [id number] Date of Birth: 02-19-1956  Today's Date: 04/04/2013 Time: 2841-3244 Time Calculation (min): 47 min Charges: 1 eval  Visit#: 1 of 8  Re-eval: 05/04/13  Subjective Subjective Assessment Subjective: [name] reports that she has been having a lot of swelling in her RLE for some time.  She is not sure when they started.  She reports that she is not able to get comfortable at night.  She has put cream, bandaids, and gauze on her foot and nothing has helped.   Date of Onset: 02/03/13  Pain Assessment Pain Assessment Pain Score: 10-Worst pain ever Pain Type: Acute pain Pain Location: Foot Pain Orientation: Left  Wound Therapy Wound 04/04/13 Abrasion(s) Toe (Comment  which one) Left distal  (Active)  Site / Wound Assessment Dusky;Friable;Purple;Yellow 04/04/2013  2:07 PM  % Wound base Red or Granulating 0% 04/04/2013  2:07 PM  % Wound base Yellow 90% 04/04/2013  2:07 PM  % Wound base Black 10% 04/04/2013  2:07 PM  Wound Length (cm) 1 cm 04/04/2013  2:07 PM  Wound Width (cm) 1.4 cm 04/04/2013  2:07 PM  Margins Unattacted edges (unapproximated) 04/04/2013  2:07 PM  Closure None 04/04/2013  2:07 PM  Drainage Amount Moderate 04/04/2013  2:07 PM  Drainage Description Purulent;No odor 04/04/2013  2:07 PM  Non-staged Wound Description Not applicable 04/04/2013  2:07 PM  Treatment Cleansed;Debridement (Selective) 04/04/2013  2:07 PM  Dressing Type Gauze (Comment) 04/04/2013  2:07 PM  Dressing Changed New 04/04/2013  2:07 PM  Dressing Status Clean;Dry;Intact 04/04/2013  2:07 PM     Incision 02/21/13 Groin Right (Active)   Selective Debridement Selective Debridement - Location: 2nd toe Selective Debridement - Tools Used: Forceps;Scissors Selective Debridement - Tissue Removed: slough   Physical Therapy Assessment and Plan Wound Therapy - Assess/Plan/Recommendations Wound Therapy - Clinical  Statement: [name] is referred to [name] for wound care of her 2nd toe.  At this time [name] is significantly limited by pain to her toe to the pain and anxietyt she is asking to have it amputated due to the severe pain to her toe.  Educated and walked [name] through diaphragmatic breathing to decrease anxiety and stress, however did not want to fully participate.  At this time was able to remove minimal slough from her toe.  Covered with honeygel and gauze.  Providied information to her assisted living facility about treatment and to place ice on her foot to decrease pain.  [name] will benefit from skilled OP [name] in order to address wound care to decrease pain, improve granulation, decrease slough and improve QOL.  Factors Delaying/Impairing Wound Healing: Diabetes Mellitus;Multiple medical problems (HTN) Hydrotherapy Plan: Debridement Wound Therapy - Frequency:  (2x/week) Wound Plan: Continue with wound care and appropriate dressing.  Continue with ice for pain control and diaphragmatic breathing to calm CNS.       Goals Wound Therapy Goals - Improve the function of patient's integumentary system by progressing the wound(s) through the phases of wound healing by: Decrease Necrotic Tissue to: 0% Increase Granulation Tissue to: 100% Increase Granulation Tissue - Progress: Goal set today Decrease Length/Width/Depth by (cm): 1x1x0 Decrease Length/Width/Depth - Progress: Goal set today Improve Drainage Characteristics: Min Improve Drainage Characteristics - Progress: Goal set today Patient/Family will be able to : independent care for wound at assisted living facility.  Patient/Family Instruction Goal - Progress: Goal set today  Problem List Patient Active Problem  List   Diagnosis Date Noted  . QT prolongation 03/22/2013  . Tobacco use disorder 02/23/2013  . Left foot pain 02/20/2013  . Neuropathy 11/02/2012  . Bacterial vaginosis 11/02/2012  . Tachycardia 06/13/2012  . Bruit 06/13/2012  . UTI (lower  urinary tract infection) 04/27/2012  . Pain in joint, shoulder region 10/05/2011  . Muscle weakness (generalized) 10/05/2011  . Cough 09/30/2011  . Fracture, humerus, proximal 07/09/2011  . Proximal humerus fracture 06/25/2011  . Chronic pain   . Claudication of calf muscles 03/25/2011  . OTHER CHRONIC PAIN 10/10/2010  . CHRONIC PANCREATITIS 09/05/2010  . THORACIC/LUMBOSACRAL NEURITIS/RADICULITIS UNSPEC 08/14/2009  . GERD 04/19/2009  . PVD WITH CLAUDICATION 09/19/2008  . UNSPECIFIED PRURITIC DISORDER 07/06/2008  . HEPATITIS C 11/18/2007  . IDDM 11/18/2007  . DEPRESSION 11/18/2007  . ALCOHOL ABUSE, HX OF 11/18/2007  . COCAINE ABUSE, HX OF 11/18/2007  . HIV INFECTION 10/25/2007   [name], [name], [name] 04/04/2013, 4:20 PM  INITIAL EVALUATION  Physical Therapy     Patient Name: [name] Date Of Birth: 24-Feb-1956  Guardian Name: N/A Treatment ICD-9 Code: 8931  Address: [address] Date of Evaluation: 04/04/2013  [place], [place] [address] Requested Dates of Service: 04/06/2013 - 05/04/2013       Therapy History: No known therapy for this problem  Reason For Referral: Recipient has a new injury, disease or condition  Prior Level of Function: *Required Assistance with ADLs (OT/[name]) or Audition, Communication, Voice and/or Swallowing Skills (ST/AUD)  Additional Medical History: [name] reports that she has been having a lot of swelling in her RLE for some time. She is not sure when they started. She reports that she is not able to get comfortable at night. She has put cream, bandaids, and gauze on her foot and nothing has helped. Site / Wound Assessment Dusky;Friable;Purple;Yellow % Wound base Red or Granulating 0% % Wound base Yellow 90% % Wound base Black 10% Wound Length (cm) 1 cm Wound Width (cm) 1.4 cm Margins Unattacted edges (unapproximated) Closure None Drainage Amount Moderate Drainage Description Purulent;No odor Non-staged Wound Description Not applicable Treatment Cleansed;Debridement  (Selective) Dressing Type Gauze (Comment) Dressing Changed New Dressing Status Clean;Dry;Intact   Prematurity: N/A  Severity Level: N/A       Treatment Goals:  1. Goal: Decrease Necrotic Tissue to: 0%  Baseline: Slough: 90%, Black 10%  Duration: 4 Week(s)  2. Goal: Increase Granulation Tissue to: 100%  Baseline: Granulation: 0%  Duration: 4 Week(s)  3. Goal: Decrease Length/Width/Depth by (cm): 1x1x0  Baseline: Wound: 1cmx1.4cmx unstageable depth  Duration: 4 Week(s)  Goal: Patient/Family will be able to : independent care for wound at assisted living facility.  Baseline: Unable to provide wound care  Duration: 4 Week(s)         Treatment Frequency/Duration:  2x/week for 4 weeks  Units per visit: N/A    Additional Information: Clinical Statement: [name] is referred to [name] for wound care of her 2nd toe. At this time [name] is significantly limited by pain to her toe to the pain and anxietyt she is asking to have it amputated due to the severe pain to her toe. Educated and walked [name] through diaphragmatic breathing to decrease anxiety and stress, however did not want to fully participate. At this time was able to remove minimal slough from her toe. Covered with honeygel and gauze. Providied information to her assisted living facility about treatment and to place ice on her foot to decrease pain. [name] will benefit from  skilled OP [name] in order to address wound care to decrease pain, improve granulation, decrease slough and improve QOL. Factors Delaying/Impairing Wound Healing: Diabetes Mellitus;Multiple medical problems (HTN) Hydrotherapy Plan: Debridement Wound Therapy - Frequency: (2x/week) Wound Plan: Continue with wound care and appropriate dressing. Continue with ice for pain control and diaphragmatic breathing to calm CNS.   [name]  04/04/13      Therapist Signature  Date Physician Signature  Date    [name]       Therapist Name  Physician Name   Refer to the Review Status page  for current case status

## 2013-04-04 NOTE — Telephone Encounter (Signed)
Nursing home called to say their NP wants [name] seen tomorrow for her left toe being cold and blue. The rest of her foot is cool but no real change. Her leg is warm. No ulcer or drainage noted. I made her appt with Dr Orlinda Blalock 04/05/13 at 9:45am

## 2013-04-05 ENCOUNTER — Ambulatory Visit (INDEPENDENT_AMBULATORY_CARE_PROVIDER_SITE_OTHER): Payer: Medicaid Other | Admitting: Vascular Surgery

## 2013-04-05 ENCOUNTER — Encounter: Payer: Self-pay | Admitting: Vascular Surgery

## 2013-04-05 VITALS — BP 135/73 | HR 71 | Temp 98.0°F | Ht 64.0 in | Wt 153.9 lb

## 2013-04-05 DIAGNOSIS — I999 Unspecified disorder of circulatory system: Secondary | ICD-10-CM

## 2013-04-05 DIAGNOSIS — S91209A Unspecified open wound of unspecified toe(s) with damage to nail, initial encounter: Secondary | ICD-10-CM | POA: Insufficient documentation

## 2013-04-05 DIAGNOSIS — I998 Other disorder of circulatory system: Secondary | ICD-10-CM | POA: Insufficient documentation

## 2013-04-05 NOTE — Progress Notes (Signed)
[facility]  Patient name: [name] MRN: [id number] DOB: 1956/07/18 Sex: female  REASON FOR VISIT: discoloration of left second toe with pain in the left foot.  HPI: [name] is a 57 y.o. female who was seen in consultation by Dr. Arbie Cookey on 02/20/2013. At that time she had rest pain in the left foot and an ulceration over her great toe and second toe of the left foot. On 02/21/2013, she underwent an arteriogram by Dr. 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 The peroneal artery was the dominant runoff vessel and it did collateralize the anterior tibial and posterior tibial arteries at the ankle.   She states that she has had continued pain in her left foot which is not bearable. She is in a skilled nursing facility. The nurse taking care of her foot was concerned about some discoloration of her left second toe and she was seen urgently at the request of the skilled nursing facility. The patient denies fever or chills.   REVIEW OF SYSTEMS: Arly.Keller ] denotes positive finding; [  ] denotes negative finding  CARDIOVASCULAR:  [ ]  chest pain   [ ]  dyspnea on exertion    CONSTITUTIONAL:  [ ]  fever   [ ]  chills  PHYSICAL EXAM: Filed Vitals:   04/05/13 0933  BP: 135/73  Pulse: 71  Temp: 98 F (36.7 C)  TempSrc: Oral  Height: 5\' 4"  (1.626 m)  Weight: 153 lb 14.4 oz (69.809 kg)  SpO2: 100%   Body mass index is 26.4 kg/(m^2). GENERAL: The patient is a well-nourished female, in no acute distress. The vital signs are documented above. CARDIOVASCULAR: There is a regular rate and rhythm. She has palpable femoral and popliteal pulses bilaterally. PULMONARY: There is good air exchange bilaterally without wheezing or rales. She has a superficial wound on her left second toe with some mild discoloration of the toe but no significant dry gangrene at this point. There is no  swelling in the foot or fluctuance although she has mild tenderness to palpation of the foot. The right foot has no open wounds.  By my Doppler exam, she has monophasic but reasonable Doppler signals in the peroneal, dorsalis pedis, and posterior tibial positions.  MEDICAL ISSUES: Based on her exam, she appears to have had a good result from her popliteal and tibial angioplasty by Dr. Myra Gianotti. However, she continues to have persistent pain in the foot despite having reasonable Doppler flow. Given her severe tibial disease with dominant peroneal artery runoff certainly she would be at risk for amputation of the left second toe of nonhealing and this would not necessarily help the pain she's having further back in her foot. I have ordered an MRI to be sure that we are not missing any deep space infection. I will have her follow up with Dr. Arbie Cookey once these results are complete. I have explained that this is a limb threatening problem. In the meantime I given instructions for the nursing home to soak the foot daily and lukewarm condyle soaked soaks and continue with dressing changes to the left second toe.  [name] S [facility] Beeper: [phone]

## 2013-04-05 NOTE — Addendum Note (Signed)
Addended by: Melodye Ped C on: 04/05/2013 10:26 AM   Modules accepted: Orders

## 2013-04-06 ENCOUNTER — Ambulatory Visit (HOSPITAL_COMMUNITY)
Admission: RE | Admit: 2013-04-06 | Discharge: 2013-04-06 | Disposition: A | Discharge status: Home / Self Care | Payer: Medicaid Other | Source: Ambulatory Visit | Attending: Infectious Disease | Admitting: Infectious Disease

## 2013-04-06 ENCOUNTER — Ambulatory Visit (HOSPITAL_COMMUNITY): Payer: Medicaid Other | Admitting: Physical Therapy

## 2013-04-06 DIAGNOSIS — IMO0001 Reserved for inherently not codable concepts without codable children: Secondary | ICD-10-CM | POA: Insufficient documentation

## 2013-04-06 DIAGNOSIS — S91109A Unspecified open wound of unspecified toe(s) without damage to nail, initial encounter: Secondary | ICD-10-CM | POA: Insufficient documentation

## 2013-04-06 DIAGNOSIS — I10 Essential (primary) hypertension: Secondary | ICD-10-CM | POA: Insufficient documentation

## 2013-04-06 DIAGNOSIS — E119 Type 2 diabetes mellitus without complications: Secondary | ICD-10-CM | POA: Insufficient documentation

## 2013-04-06 NOTE — Progress Notes (Signed)
Physical Therapy - Wound Therapy  Treatment   Patient Details  Name: [name] MRN: [id number] Date of Birth: 21-Mar-1956  Today's Date: 04/06/2013 Time: 1038-1100 Time Calculation (min): 22 min Charge: selective debridement < 20 cm  Visit#: 2 of 8  Re-eval: 05/04/13  Subjective Subjective Assessment Subjective: Care taker and pt reported visit to MD yesterday with instructions to begin soaking foot daily.  Pt reported 2nd toe pain scale 10/10.  Pain Assessment Pain Assessment Pain Score: 10-Worst pain ever Pain Location: Toe (Comment which one) (2nd toe) Pain Orientation: Left  Wound Therapy  04/06/13 1155  Subjective Assessment  Subjective Care taker and pt reported visit to MD yesterday with instructions to begin soaking foot daily.  Pt reported 2nd toe pain scale 10/10.  Wound  Date First Assessed: 04/04/13   Wound Type: Abrasion(s)  Location: (c) Toe (Comment  which one)  Location Orientation: Left  Wound Description (Comments): distal   Site / Wound Assessment Dusky;Friable;Purple;Yellow  % Wound base Red or Granulating 10%  % Wound base Yellow 80%  % Wound base Black 10%  Margins Unattacted edges (unapproximated)  Closure None  Drainage Amount Moderate  Drainage Description Purulent;No odor  Treatment Cleansed;Debridement (Selective)  Dressing Type Gauze (Comment) (medihoney)  Dressing Changed New  Dressing Status Clean;Dry;Intact  Selective Debridement  Selective Debridement - Location 2nd toe  Selective Debridement - Tools Used Forceps;Scissors  Selective Debridement - Tissue Removed slough  Wound Therapy - Assess/Plan/Recommendations  Wound Therapy - Clinical Statement Pt continues to be limited by pain.  Pt allowed debridement of yellow slough with foceps and scalpel with granulation tissues noted.  Continued with medihoney and gauze dressings due to positive results with ability for increased ease of yellow slough.  Care taker instructed dressing change  and application of honey following soaking recommended by MD.  Wound Plan Continue with wound care and appropriate dressing 1 x a week x 2 more weeks.  Continue with ice for pain control and diaphragmatic breathing to calm CNS.     Selective Debridement Selective Debridement - Location: 2nd toe Selective Debridement - Tools Used: Forceps;Scissors Selective Debridement - Tissue Removed: slough   Physical Therapy Assessment and Plan Wound Therapy - Assess/Plan/Recommendations Wound Therapy - Clinical Statement: Pt continues to be limited by pain.  Pt allowed debridement of yellow slough with foceps and scalpel with granulation tissues noted.  Continued with medihoney and gauze dressings due to positive results with ability for increased ease of yellow slough.  Care taker instructed dressing change and application of honey following soaking recommended by MD. Wound Plan: Continue with wound care and appropriate dressing 1 x a week x 2 more weeks.  Continue with ice for pain control and diaphragmatic breathing to calm CNS.       Goals    Problem List Patient Active Problem List   Diagnosis Date Noted  . Ischemic foot 04/05/2013  . Open wound of toe with avulsion of toenail 04/05/2013  . QT prolongation 03/22/2013  . Tobacco use disorder 02/23/2013  . Left foot pain 02/20/2013  . Neuropathy 11/02/2012  . Bacterial vaginosis 11/02/2012  . Tachycardia 06/13/2012  . Bruit 06/13/2012  . UTI (lower urinary tract infection) 04/27/2012  . Pain in joint, shoulder region 10/05/2011  . Muscle weakness (generalized) 10/05/2011  . Cough 09/30/2011  . Fracture, humerus, proximal 07/09/2011  . Proximal humerus fracture 06/25/2011  . Chronic pain   . Claudication of calf muscles 03/25/2011  . OTHER CHRONIC PAIN 10/10/2010  .  CHRONIC PANCREATITIS 09/05/2010  . THORACIC/LUMBOSACRAL NEURITIS/RADICULITIS UNSPEC 08/14/2009  . GERD 04/19/2009  . PVD WITH CLAUDICATION 09/19/2008  . UNSPECIFIED  PRURITIC DISORDER 07/06/2008  . HEPATITIS C 11/18/2007  . IDDM 11/18/2007  . DEPRESSION 11/18/2007  . ALCOHOL ABUSE, HX OF 11/18/2007  . COCAINE ABUSE, HX OF 11/18/2007  . HIV INFECTION 10/25/2007    GP    Juel Burrow 04/06/2013, 12:09 PM

## 2013-04-11 ENCOUNTER — Ambulatory Visit (HOSPITAL_COMMUNITY): Payer: Medicaid Other | Admitting: Physical Therapy

## 2013-04-13 ENCOUNTER — Ambulatory Visit (HOSPITAL_COMMUNITY)
Admission: RE | Admit: 2013-04-13 | Discharge: 2013-04-13 | Disposition: A | Discharge status: Home / Self Care | Payer: Medicaid Other | Source: Ambulatory Visit | Attending: Infectious Disease | Admitting: Infectious Disease

## 2013-04-13 NOTE — Progress Notes (Signed)
Physical Therapy - Wound Therapy  Treatment   Patient Details  Name: [name] MRN: [id number] Date of Birth: 1956-07-31  Today's Date: 04/13/2013 Time: 1355-1430 Time Calculation (min): 35 min Charge: selective debridement <20cm  Visit#: 3 of 8  Re-eval: 05/04/13  Subjective Subjective Assessment Subjective: Dressings intact from last session, pt reported they had not done any soaking last week. Pain scale remains 10/10 for 2nd toe.  Pain Assessment Pain Assessment Pain Score: 10-Worst pain ever Pain Location: Toe (Comment which one) (2nd) Pain Orientation: Left  Wound Therapy  04/13/13 1430  Subjective Assessment  Subjective Dressings intact from last session, pt reported they had not done any soaking last week. Pain scale remains 10/10 for 2nd toe.  Wound  Date First Assessed: 04/04/13   Wound Type: Abrasion(s)  Location: (c) Toe (Comment  which one)  Location Orientation: Left  Wound Description (Comments): distal   Site / Wound Assessment Dusky;Friable;Purple;Yellow  % Wound base Red or Granulating 20%  % Wound base Yellow 75%  % Wound base Black 10%  Margins Unattacted edges (unapproximated)  Drainage Amount Moderate  Drainage Description Purulent;No odor  Dressing Type Honeycomb;Gauze (Comment) (Medihoney)  Dressing Status Clean;Dry;Intact  Selective Debridement  Selective Debridement - Location 2nd toe  Selective Debridement - Tools Used Forceps;Scalpel  Wound Therapy - Assess/Plan/Recommendations  Wound Therapy - Clinical Statement Pt continues to be limited by pain with debridement.  Able to remove yellow slough with increased granulation following debridement.  Continued with honey and gauze.    Wound Plan Continue with wound care and appropriate dressing 1 x a week x 1 more week.  Continue with ice for pain control and diaphragmatic breathing to calm CNS.     Selective Debridement Selective Debridement - Location: 2nd toe Selective Debridement - Tools  Used: Forceps;Scalpel   Physical Therapy Assessment and Plan Wound Therapy - Assess/Plan/Recommendations Wound Therapy - Clinical Statement: Pt continues to be limited by pain with debridement.  Able to remove yellow slough with increased granulation following debridement.  Continued with honey and gauze.   Wound Plan: Continue with wound care and appropriate dressing 1 x a week x 1 more week.  Continue with ice for pain control and diaphragmatic breathing to calm CNS.       Goals    Problem List Patient Active Problem List   Diagnosis Date Noted  . Ischemic foot 04/05/2013  . Open wound of toe with avulsion of toenail 04/05/2013  . QT prolongation 03/22/2013  . Tobacco use disorder 02/23/2013  . Left foot pain 02/20/2013  . Neuropathy 11/02/2012  . Bacterial vaginosis 11/02/2012  . Tachycardia 06/13/2012  . Bruit 06/13/2012  . UTI (lower urinary tract infection) 04/27/2012  . Pain in joint, shoulder region 10/05/2011  . Muscle weakness (generalized) 10/05/2011  . Cough 09/30/2011  . Fracture, humerus, proximal 07/09/2011  . Proximal humerus fracture 06/25/2011  . Chronic pain   . Claudication of calf muscles 03/25/2011  . OTHER CHRONIC PAIN 10/10/2010  . CHRONIC PANCREATITIS 09/05/2010  . THORACIC/LUMBOSACRAL NEURITIS/RADICULITIS UNSPEC 08/14/2009  . GERD 04/19/2009  . PVD WITH CLAUDICATION 09/19/2008  . UNSPECIFIED PRURITIC DISORDER 07/06/2008  . HEPATITIS C 11/18/2007  . IDDM 11/18/2007  . DEPRESSION 11/18/2007  . ALCOHOL ABUSE, HX OF 11/18/2007  . COCAINE ABUSE, HX OF 11/18/2007  . HIV INFECTION 10/25/2007    GP    Juel Burrow 04/13/2013, 2:40 PM

## 2013-04-14 ENCOUNTER — Ambulatory Visit (HOSPITAL_COMMUNITY)
Admission: RE | Admit: 2013-04-14 | Discharge: 2013-04-14 | Disposition: A | Discharge status: Home / Self Care | Payer: Medicaid Other | Source: Ambulatory Visit | Attending: Vascular Surgery | Admitting: Vascular Surgery

## 2013-04-14 DIAGNOSIS — L02619 Cutaneous abscess of unspecified foot: Secondary | ICD-10-CM | POA: Insufficient documentation

## 2013-04-14 DIAGNOSIS — I998 Other disorder of circulatory system: Secondary | ICD-10-CM

## 2013-04-14 DIAGNOSIS — L97509 Non-pressure chronic ulcer of other part of unspecified foot with unspecified severity: Secondary | ICD-10-CM | POA: Insufficient documentation

## 2013-04-14 DIAGNOSIS — L03039 Cellulitis of unspecified toe: Secondary | ICD-10-CM | POA: Insufficient documentation

## 2013-04-14 MED ORDER — GADOBENATE DIMEGLUMINE 529 MG/ML IV SOLN
15.0000 mL | Freq: Once | INTRAVENOUS | Status: AC
  Administered 2013-04-14: 15 mL via INTRAVENOUS

## 2013-04-17 ENCOUNTER — Encounter: Payer: Self-pay | Admitting: Vascular Surgery

## 2013-04-18 ENCOUNTER — Ambulatory Visit (INDEPENDENT_AMBULATORY_CARE_PROVIDER_SITE_OTHER): Payer: Medicaid Other | Admitting: Vascular Surgery

## 2013-04-18 ENCOUNTER — Ambulatory Visit (HOSPITAL_COMMUNITY): Payer: Medicaid Other | Admitting: Physical Therapy

## 2013-04-18 ENCOUNTER — Encounter: Payer: Self-pay | Admitting: Vascular Surgery

## 2013-04-18 VITALS — BP 148/78 | HR 78 | Resp 18 | Ht 64.0 in | Wt 152.0 lb

## 2013-04-18 DIAGNOSIS — I999 Unspecified disorder of circulatory system: Secondary | ICD-10-CM

## 2013-04-18 DIAGNOSIS — I998 Other disorder of circulatory system: Secondary | ICD-10-CM

## 2013-04-18 NOTE — Progress Notes (Signed)
This is a for continued followup of her foot. She did undergo an MRI 1 04/14/2013 of her left foot showing some enhancement of the distal phalangeal bone and the great and second toe. He continues to have severe pain mostly associated with her second toe. She is having weekly local wound care to this at her nursing facility  Past Medical History  Diagnosis Date  . Depression   . Neuropathy   . Mood disorder   . Hepatitis C   . TIA (transient ischemic attack)   . Pancreatitis chronic   . Chronic pain   . HIV (human immunodeficiency virus infection) 1990's  . Polysubstance abuse   . GERD (gastroesophageal reflux disease)   . Cardiomyopathy, nonischemic     EF 45%  . Hypertension   . Coronary artery disease   . Peripheral vascular disease   . Asthma     "used to when I was young" (02/20/2013)  . Type II diabetes mellitus   . Arthritis     "legs and arms" (02/20/2013)    History  Substance Use Topics  . Smoking status: Former Smoker -- 0.30 packs/day for 40 years    Types: Cigarettes    Quit date: 02/23/2013  . Smokeless tobacco: Never Used     Comment: 02/20/2013 offered smoing cessation materials; pt declines  . Alcohol Use: No     Comment: 02/20/2013 "used to have problems w/alcohol; haven't drank anything in 4 yr"    Family History  Problem Relation Age of Onset  . Coronary artery disease Father 70    No Known Allergies  Current outpatient prescriptions:ASPIRIN LOW DOSE 81 MG EC tablet, TAKE ONE TABLET BY MOUTH ONCE DAILY., Disp: 30 tablet, Rfl: 3;  atorvastatin (LIPITOR) 10 MG tablet, Take 10 mg by mouth at bedtime., Disp: , Rfl: ;  cetirizine (ZYRTEC) 10 MG tablet, Take 10 mg by mouth daily., Disp: , Rfl: ;  docusate sodium (COLACE) 100 MG capsule, Take 100 mg by mouth daily. , Disp: , Rfl:  efavirenz-emtricitabine-tenofovir (ATRIPLA) 600-200-300 MG per tablet, Take 1 tablet by mouth at bedtime. , Disp: , Rfl: ;  ferrous sulfate 325 (65 FE) MG tablet, Take 325 mg by mouth  daily with breakfast., Disp: , Rfl: ;  HYDROcodone-acetaminophen (NORCO/VICODIN) 5-325 MG per tablet, Take 1 tablet by mouth every 6 (six) hours as needed for pain (left foot pain). pain, Disp: 30 tablet, Rfl: 0 insulin glargine (LANTUS) 100 UNIT/ML injection, Inject 45 Units into the skin at bedtime. , Disp: , Rfl: ;  metoprolol succinate (TOPROL-XL) 50 MG 24 hr tablet, Take 1 tablet (50 mg total) by mouth daily. Take with or immediately following a meal., Disp: 30 tablet, Rfl: 4;  mirtazapine (REMERON) 15 MG tablet, Take 15 mg by mouth at bedtime., Disp: , Rfl: ;  Multiple Vitamin (DAILY VITE) TABS, Take 1 tablet by mouth daily., Disp: , Rfl:  omeprazole (PRILOSEC) 20 MG capsule, TAKE (1) CAPSULE BY MOUTH ONCE DAILY., Disp: 30 capsule, Rfl: 3;  ondansetron (ZOFRAN) 8 MG tablet, Take 1 tablet (8 mg total) by mouth every 8 (eight) hours as needed for nausea., Disp: 6 tablet, Rfl: 0 Pancrelipase, Lip-Prot-Amyl, (CREON) 6000 UNITS CPEP, Take 12,000 Units by mouth 3 (three) times daily. Patient takes 2 capsules three times daily with meals and 1 capsule three times daily with snacks, Disp: , Rfl: ;  polyethylene glycol (MIRALAX) powder, Take 17 g by mouth daily as needed. constipation, Disp: , Rfl: ;  cephALEXin (KEFLEX) 500 MG   capsule, Take 1 capsule (500 mg total) by mouth 4 (four) times daily., Disp: 56 capsule, Rfl: 0 desipramine (NORPRAMIN) 25 MG tablet, Take 25 mg by mouth at bedtime., Disp: , Rfl: ;  doxycycline (VIBRA-TABS) 100 MG tablet, Take 100 mg by mouth 2 (two) times daily., Disp: , Rfl: ;  nicotine (NICODERM CQ - DOSED IN MG/24 HR) 7 mg/24hr patch, Place 1 patch onto the skin daily., Disp: 28 patch, Rfl: 0;  traZODone (DESYREL) 100 MG tablet, Take 1 tablet (100 mg total) by mouth at bedtime., Disp: 30 tablet, Rfl: 3  BP 148/78  Pulse 78  Resp 18  Ht 5' 4" (1.626 m)  Wt 152 lb (68.947 kg)  BMI 26.08 kg/m2  Body mass index is 26.08 kg/(m^2).       On physical exam well-developed in no  acute distress. Respirations nonlabored. Her left foot size secondary dressing was removed and she does have bone exposed at where she has no prior loss her nail. There is no evidence of metatarsal head involvement  Doppler flow in her foot is quite brisk at peroneal anterior tibial and posterior tibial arteries.  Impression and plan: Continued severe pain in her left second toe due to the exposed bone. I have explained that she will require toe amputation. I did explain the potential for nonhealing of this and that she is still certainly at limb threatening ischemia. Recommended left toe amputations outpatient. She is not willing to proceed with that this week. She is willing to proceed with us next week and has been scheduled for surgery on 523 as an outpatient at [facility]    

## 2013-04-20 ENCOUNTER — Ambulatory Visit (HOSPITAL_COMMUNITY)
Admission: RE | Admit: 2013-04-20 | Discharge: 2013-04-20 | Disposition: A | Discharge status: Home / Self Care | Payer: Medicaid Other | Source: Ambulatory Visit | Attending: Infectious Disease | Admitting: Infectious Disease

## 2013-04-20 ENCOUNTER — Ambulatory Visit (HOSPITAL_COMMUNITY): Payer: Medicaid Other | Admitting: Physical Therapy

## 2013-04-20 NOTE — Progress Notes (Signed)
Physical Therapy - Wound Therapy  Treatment   Patient Details  Name: [name] MRN: [id number] Date of Birth: [birth year]  Today's Date: 04/20/2013 Time: 4098-1191 Time Calculation (min): 34 min  Visit#: 4 of 8  Re-eval: 05/04/13  Subjective Subjective Assessment Subjective: Pt reported she is having 2nd toe removed next Friday, wishes to come in for wound care today for pain relief.    Pain Assessment Pain Assessment Pain Score:   6 Pain Location: Toe (Comment which one) (2nd toe) Pain Orientation: Left  Wound Therapy  04/20/13 1423  Subjective Assessment  Subjective Pt reported she is having 2nd toe removed next Friday, wishes to come in for wound care today for pain relief.    Wound  Date First Assessed: 04/04/13   Wound Type: Abrasion(s)  Location: (c) Toe (Comment  which one)  Location Orientation: Left  Wound Description (Comments): distal   Site / Wound Assessment Dusky;Friable;Purple;Yellow  % Wound base Red or Granulating 70%  % Wound base Yellow 20%  % Wound base Black 10%  Wound Length (cm) 1 cm  Wound Width (cm) 1.4 cm  Wound Depth (cm) (unstagable)  Margins Unattacted edges (unapproximated)  Drainage Amount Moderate  Drainage Description Purulent;No odor  Dressing Type Gauze (Comment);Moist to moist;Hydrogel (Vaseline periwound)  Dressing Changed New  Dressing Status Clean;Dry;Intact  Selective Debridement  Selective Debridement - Location 2nd toe  Selective Debridement - Tools Used Forceps;Scalpel  Selective Debridement - Tissue Removed slough  Wound Therapy - Assess/Plan/Recommendations  Wound Therapy - Clinical Statement Wound care complete this session per pt. request for pain control prior amputation next week.  Wound continues to improve in granulation following debridement.  Pt reported pain reduced end of session to 4/10.  Wound Plan D/C per pt request per amputation next week    Selective Debridement Selective Debridement - Location: 2nd  toe Selective Debridement - Tools Used: Forceps;Scalpel Selective Debridement - Tissue Removed: slough   Physical Therapy Assessment and Plan Wound Therapy - Assess/Plan/Recommendations Wound Therapy - Clinical Statement: Wound care complete this session per pt. request for pain control prior amputation next week.  Wound continues to improve in granulation following debridement.  Pt reported pain reduced end of session to 4/10. Wound Plan: D/C per pt request per amputation next week      Goals    Problem List Patient Active Problem List   Diagnosis Date Noted  . Ischemic foot 04/05/2013  . Open wound of toe with avulsion of toenail 04/05/2013  . QT prolongation 03/22/2013  . Tobacco use disorder 02/23/2013  . Left foot pain 02/20/2013  . Neuropathy 11/02/2012  . Bacterial vaginosis 11/02/2012  . Tachycardia 06/13/2012  . Bruit 06/13/2012  . UTI (lower urinary tract infection) 04/27/2012  . Pain in joint, shoulder region 10/05/2011  . Muscle weakness (generalized) 10/05/2011  . Cough 09/30/2011  . Fracture, humerus, proximal 07/09/2011  . Proximal humerus fracture 06/25/2011  . Chronic pain   . Claudication of calf muscles 03/25/2011  . OTHER CHRONIC PAIN 10/10/2010  . CHRONIC PANCREATITIS 09/05/2010  . THORACIC/LUMBOSACRAL NEURITIS/RADICULITIS UNSPEC 08/14/2009  . GERD 04/19/2009  . PVD WITH CLAUDICATION 09/19/2008  . UNSPECIFIED PRURITIC DISORDER 07/06/2008  . HEPATITIS C 11/18/2007  . IDDM 11/18/2007  . DEPRESSION 11/18/2007  . ALCOHOL ABUSE, HX OF 11/18/2007  . COCAINE ABUSE, HX OF 11/18/2007  . HIV INFECTION 10/25/2007    GP    Juel Burrow 04/20/2013, 2:33 PM

## 2013-04-21 ENCOUNTER — Ambulatory Visit: Payer: Medicaid Other | Admitting: Cardiology

## 2013-04-21 ENCOUNTER — Other Ambulatory Visit: Payer: Self-pay

## 2013-04-24 ENCOUNTER — Ambulatory Visit: Payer: Medicaid Other | Admitting: Surgery

## 2013-04-24 NOTE — Pre-Procedure Instructions (Addendum)
[name]  04/24/2013   Your procedure is scheduled on:  Fri, May 23 @ 12:00 PM  Report to [facility] at 7:30 AM.  Call this number if you have problems the morning of surgery: [phone]   Remember:   Do not eat food or drink liquids after midnight.   Take these medicines the morning of surgery with A SIP OF WATER:Zyrtec(Cetirizine),Pain Pill(if needed),Metoprolol(Toprol),Zofran(Ondansetron),and Prilosec(Omeprazole)   Do not wear jewelry, make-up or nail polish.  Do not wear lotions, powders, or perfumes. You may wear deodorant.  Do not shave 48 hours prior to surgery.   Do not bring valuables to the hospital.  Contacts, dentures or bridgework may not be worn into surgery.  Leave suitcase in the car. After surgery it may be brought to your room.  For patients admitted to the hospital, checkout time is 11:00 AM the day of  discharge.   Patients discharged the day of surgery will not be allowed to drive  home.    Special Instructions: Shower using CHG 2 nights before surgery and the night before surgery.  If you shower the day of surgery use CHG.  Use special wash - you have one bottle of CHG for all showers.  You should use approximately 1/3 of the bottle for each shower.   Please read over the following fact sheets that you were given: Pain Booklet, Coughing and Deep Breathing, MRSA Information and Surgical Site Infection Prevention

## 2013-04-25 ENCOUNTER — Ambulatory Visit (HOSPITAL_COMMUNITY): Payer: Medicaid Other | Admitting: Physical Therapy

## 2013-04-25 ENCOUNTER — Encounter (HOSPITAL_COMMUNITY)
Admission: RE | Admit: 2013-04-25 | Discharge: 2013-04-25 | Disposition: A | Discharge status: Home / Self Care | Payer: Medicaid Other | Source: Ambulatory Visit | Attending: Vascular Surgery | Admitting: Vascular Surgery

## 2013-04-25 ENCOUNTER — Encounter (HOSPITAL_COMMUNITY): Payer: Self-pay

## 2013-04-25 HISTORY — DX: Nausea: R11.0

## 2013-04-25 HISTORY — DX: Frequency of micturition: R35.0

## 2013-04-25 HISTORY — DX: Other seasonal allergic rhinitis: J30.2

## 2013-04-25 HISTORY — DX: Cardiac arrhythmia, unspecified: I49.9

## 2013-04-25 HISTORY — DX: Anemia, unspecified: D64.9

## 2013-04-25 HISTORY — DX: Insomnia, unspecified: G47.00

## 2013-04-25 HISTORY — DX: Other chronic pain: G89.29

## 2013-04-25 HISTORY — DX: Dorsalgia, unspecified: M54.9

## 2013-04-25 HISTORY — DX: Other symptoms and signs involving the musculoskeletal system: R29.898

## 2013-04-25 HISTORY — DX: Polyneuropathy, unspecified: G62.9

## 2013-04-25 HISTORY — DX: Pure hypercholesterolemia, unspecified: E78.00

## 2013-04-25 HISTORY — DX: Nocturia: R35.1

## 2013-04-25 HISTORY — DX: Constipation, unspecified: K59.00

## 2013-04-25 LAB — COMPREHENSIVE METABOLIC PANEL
ALT: 43 U/L — ABNORMAL HIGH (ref 0–35)
AST: 50 U/L — ABNORMAL HIGH (ref 0–37)
Alkaline Phosphatase: 79 U/L (ref 39–117)
CO2: 19 mEq/L (ref 19–32)
Calcium: 9.2 mg/dL (ref 8.4–10.5)
Chloride: 106 mEq/L (ref 96–112)
GFR calc Af Amer: 76 mL/min — ABNORMAL LOW (ref >90)
GFR calc non Af Amer: 65 mL/min — ABNORMAL LOW (ref >90)
Glucose, Bld: 87 mg/dL (ref 70–99)
Sodium: 139 mEq/L (ref 135–145)
Total Bilirubin: 0.2 mg/dL — ABNORMAL LOW (ref 0.3–1.2)

## 2013-04-25 LAB — CBC
Hemoglobin: 12.3 g/dL (ref 12.0–15.0)
MCH: 30.7 pg (ref 26.0–34.0)
MCV: 91.5 fL (ref 78.0–100.0)
RBC: 4.01 MIL/uL (ref 3.87–5.11)
WBC: 6.9 10*3/uL (ref 4.0–10.5)

## 2013-04-25 LAB — SURGICAL PCR SCREEN: MRSA, PCR: POSITIVE — AB

## 2013-04-25 NOTE — Progress Notes (Addendum)
Cardiologist is Dr.Hochrein with next visit on 04/26/13 @ 0915  Echo reports in epic from 2007 and 2013  Stress test reports in epic from 2007 and 2013  Denies ever having a heart cath  CXR report in epic from 02/2013  EKG report in epic from 03/2013  Medical Md is Dr.VAn Nada Maclachlan

## 2013-04-26 ENCOUNTER — Encounter: Payer: Self-pay | Admitting: Cardiology

## 2013-04-26 ENCOUNTER — Ambulatory Visit (INDEPENDENT_AMBULATORY_CARE_PROVIDER_SITE_OTHER): Payer: Medicaid Other | Admitting: Cardiology

## 2013-04-26 VITALS — BP 108/53 | HR 72 | Ht 64.0 in | Wt 153.4 lb

## 2013-04-26 DIAGNOSIS — I420 Dilated cardiomyopathy: Secondary | ICD-10-CM

## 2013-04-26 DIAGNOSIS — I428 Other cardiomyopathies: Secondary | ICD-10-CM

## 2013-04-26 NOTE — Patient Instructions (Signed)
Continue all current medications. Your physician wants you to follow up in:  1 year.  You will receive a reminder letter in the mail one-two months in advance.  If you don't receive a letter, please call our office to schedule the follow up appointment   

## 2013-04-26 NOTE — Progress Notes (Signed)
HPI The patient was initially referred for evaluation of tachycardia. Holter monitor demonstrated sinus tachycardia but no sustained other dysrhythmia. Echocardiogram demonstrated a mildly reduced ejection fraction of 45% with global hypokinesis without significant valvular abnormalities. Stress perfusion study suggested no evidence of ischemia or infarct.   She is now being evaluated for an ischemic toe and she will require amputation for this. She is sent here for preoperative clearance.  She denies any new cardiovascular symptoms since her stress testing last year. She does not report palpitations, presyncope or syncope. She does not have chest pressure, neck or arm discomfort. She has no shortness of breath, PND or orthopnea. Unfortunately she is very limited by her pain in her foot. Of note she has stopped smoking for greater than 50 days.   No Known Allergies  Current Outpatient Prescriptions  Medication Sig Dispense Refill  . aspirin EC 81 MG tablet Take 81 mg by mouth daily.      . cetirizine (ZYRTEC) 10 MG tablet Take 10 mg by mouth daily.      . colchicine 0.6 MG tablet Take 0.6 mg by mouth 2 (two) times daily.      Marland Kitchen desipramine (NORPRAMIN) 25 MG tablet Take 25 mg by mouth at bedtime.      . docusate sodium (COLACE) 100 MG capsule Take 100 mg by mouth daily.       Marland Kitchen efavirenz-emtricitabine-tenofovir (ATRIPLA) 600-200-300 MG per tablet Take 1 tablet by mouth at bedtime.       . ferrous sulfate 325 (65 FE) MG tablet Take 325 mg by mouth daily with breakfast.      . HYDROcodone-acetaminophen (NORCO/VICODIN) 5-325 MG per tablet Take 1 tablet by mouth every 6 (six) hours as needed for pain (left foot pain). pain  30 tablet  0  . insulin aspart (NOVOLOG FLEXPEN) 100 unit/mL SOLN FlexPen Inject 10 Units into the skin 3 (three) times daily with meals. SSI. Plus 1 unit for every 30mg /dl above 100mg /dl      . insulin glargine (LANTUS) 100 UNIT/ML injection Inject 45 Units into the skin at  bedtime.       . metoprolol succinate (TOPROL-XL) 50 MG 24 hr tablet Take 50 mg by mouth daily after breakfast. Take with or immediately following a meal.      . mirtazapine (REMERON SOL-TAB) 15 MG disintegrating tablet Take 15 mg by mouth at bedtime.      . Multiple Vitamin (DAILY VITE) TABS Take 1 tablet by mouth daily.      Marland Kitchen omeprazole (PRILOSEC) 20 MG capsule Take 20 mg by mouth daily.      . Pancrelipase, Lip-Prot-Amyl, (CREON) 6000 UNITS CPEP Take 6,000-12,000 Units by mouth 3 (three) times daily. Patient takes 2 capsules three times daily with meals and 1 capsule three times daily with snacks      . polyethylene glycol (MIRALAX) powder Take 17 g by mouth daily. constipation      . pregabalin (LYRICA) 100 MG capsule Take 100 mg by mouth daily.      . traZODone (DESYREL) 100 MG tablet Take 100 mg by mouth at bedtime.       No current facility-administered medications for this visit.    Past Medical History  Diagnosis Date  . Depression   . Neuropathy   . Mood disorder   . TIA (transient ischemic attack)   . Pancreatitis chronic   . Chronic pain   . HIV (human immunodeficiency virus infection) 1990's    takes Norpramin  nightly  . Polysubstance abuse   . Cardiomyopathy, nonischemic     EF 45%  . Coronary artery disease   . Peripheral vascular disease   . Asthma     "used to when I was young" (02/20/2013)  . Arthritis     "legs and arms" (02/20/2013)  . Hypercholesterolemia     takes Lipitor nightly  . Seasonal allergies     takes Zyrtec daily  . Anemia     takes Iron pill daily  . Nausea     takes Zofran prn  . GERD (gastroesophageal reflux disease)     takes Prilosec daily  . Insomnia     takes Trazodone nightly  . Constipation     takes miralax prn and stool softener daily  . Type II diabetes mellitus     lantus 45units at bedtime  . Dysrhythmia     takes Metoprolol daily  . Weakness of both legs   . Peripheral neuropathy   . Chronic back pain   . Hepatitis C      C  . Urinary frequency   . Nocturia     Past Surgical History  Procedure Laterality Date  . Carotid endarterectomy Right   . Tubal ligation    . Lower extremity angiogram      ROS:  As stated in the HPI and negative for all other systems.  PHYSICAL EXAM BP 108/53  Pulse 72  Ht 5\' 4"  (1.626 m)  Wt 153 lb 6.4 oz (69.582 kg)  BMI 26.32 kg/m2 GENERAL:  Well appearing HEENT:  Pupils equal round and reactive, fundi not visualized, oral mucosa unremarkable, poor dentition. NECK:  No jugular venous distention, waveform within normal limits, carotid upstroke brisk and symmetric, right bruit, no thyromegaly LUNGS:  Clear to auscultation bilaterally BACK:  No CVA tenderness HEART:  PMI not displaced or sustained,S1 and S2 within normal limits, no S3, no S4, no clicks, no rubs, soft apical systolic murmur, nonradiating no diastolic murmurs ABD:  Flat, positive bowel sounds normal in frequency in pitch, no bruits, no rebound, no guarding, no midline pulsatile mass, no hepatomegaly, no splenomegaly, mildly distended with mild diffuse tenderness to deep palpation EXT:  2 plus pulses upper, diminished dorsalis pedis and posterior tibialis bilaterally, no edema, no cyanosis no clubbing NEURO:  Cranial nerves II through XII grossly intact, motor grossly intact throughout   ASSESSMENT AND PLAN  Preop -  The patient will require amputation of her toe.  She had a recent negative stress perfusion study. She has had no ongoing new symptoms. Therefore, based on ACC/AHA guidelines, the patient would be at acceptable risk for the planned procedure without further cardiovascular testing.  Tachycardia -  Her heart rate is  improved with beta blocker and she will continue the meds as listed.  Carotid stenosis -  She has moderate obstruction with 60-79% left stenosis and 0-39% right stenosis. I have asked Dr. Arbie Cookey if his office wants to take over follow up of this.    Cardiomyopathy-  She had a  negative perfusion study in this appears to be nonischemic. No further evaluation is planned and we will manage this medically.  Of note the EF was 53% on that study.    Tobacco abuse - She is currently smoking. I encourage continued abstinence and gradually her.  PVD  - As above.

## 2013-04-27 ENCOUNTER — Ambulatory Visit (HOSPITAL_COMMUNITY): Payer: Medicaid Other | Admitting: Physical Therapy

## 2013-04-27 MED ORDER — DEXTROSE 5 % IV SOLN
1.5000 g | INTRAVENOUS | Status: AC
  Administered 2013-04-28: 1.5 g via INTRAVENOUS
  Filled 2013-04-27: qty 1.5

## 2013-04-27 MED ORDER — SODIUM CHLORIDE 0.9 % IV SOLN: INTRAVENOUS | Status: DC

## 2013-04-27 NOTE — Progress Notes (Signed)
SPOKE WITH [name] AT NURSING FACILITY WHO STATED SHE WAS TOLD PATIENT NEEDED TO ARRIVE AT 630 AM 04/28/13.

## 2013-04-28 ENCOUNTER — Encounter (HOSPITAL_COMMUNITY)
Admission: RE | Disposition: A | Discharge status: Home / Self Care | Payer: Self-pay | Source: Ambulatory Visit | Attending: Vascular Surgery

## 2013-04-28 ENCOUNTER — Ambulatory Visit (HOSPITAL_COMMUNITY)
Admission: RE | Admit: 2013-04-28 | Discharge: 2013-04-28 | Disposition: A | Discharge status: Home / Self Care | Payer: Medicaid Other | Source: Ambulatory Visit | Attending: Vascular Surgery | Admitting: Vascular Surgery

## 2013-04-28 ENCOUNTER — Telehealth: Payer: Self-pay | Admitting: Vascular Surgery

## 2013-04-28 ENCOUNTER — Encounter (HOSPITAL_COMMUNITY): Payer: Self-pay | Admitting: *Deleted

## 2013-04-28 ENCOUNTER — Encounter (HOSPITAL_COMMUNITY): Payer: Self-pay | Admitting: Anesthesiology

## 2013-04-28 ENCOUNTER — Ambulatory Visit (HOSPITAL_COMMUNITY): Payer: Medicaid Other | Admitting: Anesthesiology

## 2013-04-28 DIAGNOSIS — E1159 Type 2 diabetes mellitus with other circulatory complications: Secondary | ICD-10-CM | POA: Insufficient documentation

## 2013-04-28 DIAGNOSIS — I739 Peripheral vascular disease, unspecified: Secondary | ICD-10-CM | POA: Insufficient documentation

## 2013-04-28 DIAGNOSIS — G609 Hereditary and idiopathic neuropathy, unspecified: Secondary | ICD-10-CM | POA: Insufficient documentation

## 2013-04-28 DIAGNOSIS — I428 Other cardiomyopathies: Secondary | ICD-10-CM | POA: Insufficient documentation

## 2013-04-28 DIAGNOSIS — Z21 Asymptomatic human immunodeficiency virus [HIV] infection status: Secondary | ICD-10-CM | POA: Insufficient documentation

## 2013-04-28 DIAGNOSIS — Z79899 Other long term (current) drug therapy: Secondary | ICD-10-CM | POA: Insufficient documentation

## 2013-04-28 DIAGNOSIS — M129 Arthropathy, unspecified: Secondary | ICD-10-CM | POA: Insufficient documentation

## 2013-04-28 DIAGNOSIS — B1920 Unspecified viral hepatitis C without hepatic coma: Secondary | ICD-10-CM | POA: Insufficient documentation

## 2013-04-28 DIAGNOSIS — Z87891 Personal history of nicotine dependence: Secondary | ICD-10-CM | POA: Insufficient documentation

## 2013-04-28 DIAGNOSIS — I96 Gangrene, not elsewhere classified: Secondary | ICD-10-CM | POA: Insufficient documentation

## 2013-04-28 DIAGNOSIS — K219 Gastro-esophageal reflux disease without esophagitis: Secondary | ICD-10-CM | POA: Insufficient documentation

## 2013-04-28 DIAGNOSIS — F39 Unspecified mood [affective] disorder: Secondary | ICD-10-CM | POA: Insufficient documentation

## 2013-04-28 DIAGNOSIS — I2510 Atherosclerotic heart disease of native coronary artery without angina pectoris: Secondary | ICD-10-CM | POA: Insufficient documentation

## 2013-04-28 DIAGNOSIS — I10 Essential (primary) hypertension: Secondary | ICD-10-CM | POA: Insufficient documentation

## 2013-04-28 DIAGNOSIS — L97509 Non-pressure chronic ulcer of other part of unspecified foot with unspecified severity: Secondary | ICD-10-CM | POA: Insufficient documentation

## 2013-04-28 DIAGNOSIS — Z7982 Long term (current) use of aspirin: Secondary | ICD-10-CM | POA: Insufficient documentation

## 2013-04-28 DIAGNOSIS — S91209D Unspecified open wound of unspecified toe(s) with damage to nail, subsequent encounter: Secondary | ICD-10-CM

## 2013-04-28 DIAGNOSIS — Z794 Long term (current) use of insulin: Secondary | ICD-10-CM | POA: Insufficient documentation

## 2013-04-28 DIAGNOSIS — K861 Other chronic pancreatitis: Secondary | ICD-10-CM | POA: Insufficient documentation

## 2013-04-28 DIAGNOSIS — I70269 Atherosclerosis of native arteries of extremities with gangrene, unspecified extremity: Secondary | ICD-10-CM

## 2013-04-28 DIAGNOSIS — Z8673 Personal history of transient ischemic attack (TIA), and cerebral infarction without residual deficits: Secondary | ICD-10-CM | POA: Insufficient documentation

## 2013-04-28 HISTORY — PX: AMPUTATION: SHX166

## 2013-04-28 SURGERY — AMPUTATION DIGIT
Anesthesia: General | Site: Toe | Laterality: Left | Wound class: Dirty or Infected

## 2013-04-28 MED ORDER — ONDANSETRON HCL 4 MG/2ML IJ SOLN
INTRAMUSCULAR | Status: DC | PRN
  Administered 2013-04-28: 4 mg via INTRAVENOUS

## 2013-04-28 MED ORDER — LACTATED RINGERS IV SOLN
INTRAVENOUS | Status: DC | PRN
  Administered 2013-04-28: 09:00:00 via INTRAVENOUS

## 2013-04-28 MED ORDER — HYDROMORPHONE HCL PF 1 MG/ML IJ SOLN
0.2500 mg | INTRAMUSCULAR | Status: DC | PRN
  Administered 2013-04-28 (×2): 0.5 mg via INTRAVENOUS

## 2013-04-28 MED ORDER — HYDROCODONE-ACETAMINOPHEN 5-325 MG PO TABS: 1.0000 | ORAL_TABLET | ORAL | Status: DC | PRN

## 2013-04-28 MED ORDER — PHENYLEPHRINE HCL 10 MG/ML IJ SOLN
10.0000 mg | INTRAVENOUS | Status: DC | PRN
  Administered 2013-04-28: 10 ug/min via INTRAVENOUS

## 2013-04-28 MED ORDER — LIDOCAINE HCL (CARDIAC) 20 MG/ML IV SOLN
INTRAVENOUS | Status: DC | PRN
  Administered 2013-04-28: 80 mg via INTRAVENOUS

## 2013-04-28 MED ORDER — METOPROLOL TARTRATE 1 MG/ML IV SOLN
INTRAVENOUS | Status: DC | PRN
  Administered 2013-04-28: 2.5 mg via INTRAVENOUS

## 2013-04-28 MED ORDER — LIDOCAINE HCL (PF) 1 % IJ SOLN
INTRAMUSCULAR | Status: AC
  Filled 2013-04-28: qty 30

## 2013-04-28 MED ORDER — OXYCODONE HCL 5 MG PO TABS: 5.0000 mg | ORAL_TABLET | Freq: Once | ORAL | Status: DC | PRN

## 2013-04-28 MED ORDER — 0.9 % SODIUM CHLORIDE (POUR BTL) OPTIME
TOPICAL | Status: DC | PRN
  Administered 2013-04-28: 1000 mL

## 2013-04-28 MED ORDER — PROPOFOL 10 MG/ML IV BOLUS
INTRAVENOUS | Status: DC | PRN
  Administered 2013-04-28: 120 mg via INTRAVENOUS

## 2013-04-28 MED ORDER — MIDAZOLAM HCL 5 MG/5ML IJ SOLN
INTRAMUSCULAR | Status: DC | PRN
  Administered 2013-04-28: 2 mg via INTRAVENOUS

## 2013-04-28 MED ORDER — PROMETHAZINE HCL 25 MG/ML IJ SOLN: 6.2500 mg | INTRAMUSCULAR | Status: DC | PRN

## 2013-04-28 MED ORDER — FENTANYL CITRATE 0.05 MG/ML IJ SOLN
INTRAMUSCULAR | Status: DC | PRN
  Administered 2013-04-28: 25 ug via INTRAVENOUS
  Administered 2013-04-28: 75 ug via INTRAVENOUS
  Administered 2013-04-28 (×2): 25 ug via INTRAVENOUS

## 2013-04-28 MED ORDER — OXYCODONE HCL 5 MG/5ML PO SOLN: 5.0000 mg | Freq: Once | ORAL | Status: DC | PRN

## 2013-04-28 SURGICAL SUPPLY — 37 items
BANDAGE CONFORM 2  STR LF (GAUZE/BANDAGES/DRESSINGS) ×2 IMPLANT
BANDAGE GAUZE ELAST BULKY 4 IN (GAUZE/BANDAGES/DRESSINGS) ×2 IMPLANT
CANISTER SUCTION 2500CC (MISCELLANEOUS) ×2 IMPLANT
CLIP LIGATING EXTRA MED SLVR (CLIP) ×2 IMPLANT
CLIP LIGATING EXTRA SM BLUE (MISCELLANEOUS) ×2 IMPLANT
CLOTH BEACON ORANGE TIMEOUT ST (SAFETY) ×2 IMPLANT
COVER SURGICAL LIGHT HANDLE (MISCELLANEOUS) ×2 IMPLANT
DRAPE EXTREMITY T 121X128X90 (DRAPE) ×2 IMPLANT
ELECT REM PT RETURN 9FT ADLT (ELECTROSURGICAL) ×2
ELECTRODE REM PT RTRN 9FT ADLT (ELECTROSURGICAL) ×1 IMPLANT
GAUZE SPONGE 2X2 8PLY STRL LF (GAUZE/BANDAGES/DRESSINGS) ×1 IMPLANT
GLOVE BIOGEL PI IND STRL 6.5 (GLOVE) ×1 IMPLANT
GLOVE BIOGEL PI IND STRL 7.5 (GLOVE) ×1 IMPLANT
GLOVE BIOGEL PI INDICATOR 6.5 (GLOVE) ×1
GLOVE BIOGEL PI INDICATOR 7.5 (GLOVE) ×1
GLOVE SS BIOGEL STRL SZ 7.5 (GLOVE) ×1 IMPLANT
GLOVE SUPERSENSE BIOGEL SZ 7.5 (GLOVE) ×1
GLOVE SURG SS PI 6.5 STRL IVOR (GLOVE) ×2 IMPLANT
GLOVE SURG SS PI 7.5 STRL IVOR (GLOVE) ×2 IMPLANT
GOWN STRL NON-REIN LRG LVL3 (GOWN DISPOSABLE) ×6 IMPLANT
KIT BASIN OR (CUSTOM PROCEDURE TRAY) ×2 IMPLANT
KIT ROOM TURNOVER OR (KITS) ×2 IMPLANT
NS IRRIG 1000ML POUR BTL (IV SOLUTION) ×2 IMPLANT
PACK GENERAL/GYN (CUSTOM PROCEDURE TRAY) ×2 IMPLANT
PAD ARMBOARD 7.5X6 YLW CONV (MISCELLANEOUS) ×4 IMPLANT
SPECIMEN JAR SMALL (MISCELLANEOUS) ×2 IMPLANT
SPONGE GAUZE 2X2 STER 10/PKG (GAUZE/BANDAGES/DRESSINGS) ×1
SPONGE GAUZE 4X4 12PLY (GAUZE/BANDAGES/DRESSINGS) ×2 IMPLANT
SUT ETHILON 3 0 PS 1 (SUTURE) ×2 IMPLANT
SUT VIC AB 3-0 SH 27 (SUTURE) ×1
SUT VIC AB 3-0 SH 27X BRD (SUTURE) ×1 IMPLANT
SYR CONTROL 10ML LL (SYRINGE) IMPLANT
TOWEL OR 17X24 6PK STRL BLUE (TOWEL DISPOSABLE) ×2 IMPLANT
TOWEL OR 17X26 10 PK STRL BLUE (TOWEL DISPOSABLE) ×2 IMPLANT
TUBE ANAEROBIC SPECIMEN COL (MISCELLANEOUS) IMPLANT
UNDERPAD 30X30 INCONTINENT (UNDERPADS AND DIAPERS) ×2 IMPLANT
WATER STERILE IRR 1000ML POUR (IV SOLUTION) ×2 IMPLANT

## 2013-04-28 NOTE — Telephone Encounter (Signed)
Message copied by Margaretmary Eddy on Fri Apr 28, 2013  2:55 PM ------      Message from: Melene Plan      Created: Fri Apr 28, 2013 10:32 AM                   ----- Message -----         From: Lars Mage, PA-C         Sent: 04/28/2013   9:28 AM           To: Melene Plan, RN            Amputation left 2nd toe.  F/U with Dr. Arbie Cookey in 2 weeks. ------

## 2013-04-28 NOTE — Op Note (Signed)
OPERATIVE REPORT  DATE OF SURGERY: 04/28/2013  PATIENT: [name], 57 y.o. female MRN: [id number]  DOB: 12/22/1955  PRE-OPERATIVE DIAGNOSIS: Gangrene left second toe  POST-OPERATIVE DIAGNOSIS:  Same  PROCEDURE: Left second toe amputations  SURGEON:  Gretta Began, M.D.    ANESTHESIA:  Gen.  EBL: Minimal ml  Total I/O In: 400 [I.V.:400] Out: 15 [Blood:15]  BLOOD ADMINISTERED: None  DRAINS: None  SPECIMEN: Second toe  COUNTS CORRECT:  YES  PLAN OF CARE: PACU   PATIENT DISPOSITION:  PACU - hemodynamically stable  PROCEDURE DETAILS: The patient was taken up replacing that is where there the left foot was prepped and draped in the sterile fashion. [place] incision was made at the base of the left second toe. This was carried down to the digital bone. The periosteum was elevated proximally and the bone was divided. The metatarsal head was left intact. The wound did have no evidence of necrosis at the area of the incision and there was good bleeding. Bleeding was controlled with electrocautery. The wound was irrigated with saline. The wound was closed with a single subcuticular stitch at the subcutaneous tissue with 3-0 Vicryl. The skin edges were closed with 3-0 nylon mattress sutures. Patient was transferred to the recovery room after sterile dressing was applied.   Gretta Began, M.D. 04/28/2013 10:11 AM

## 2013-04-28 NOTE — Anesthesia Postprocedure Evaluation (Signed)
Anesthesia Post Note  Patient: [name]  Procedure(s) Performed: Procedure(s) (LRB): AMPUTATION DIGIT (Left)  Anesthesia type: general  Patient location: PACU  Post pain: Pain level controlled  Post assessment: Patient's Cardiovascular Status Stable  Last Vitals:  Filed Vitals:   04/28/13 1110  BP:   Pulse: 73  Temp: 36.7 C  Resp: 13    Post vital signs: Reviewed and stable  Level of consciousness: sedated  Complications: No apparent anesthesia complications

## 2013-04-28 NOTE — Telephone Encounter (Signed)
Gave appt info to [employer] - kf

## 2013-04-28 NOTE — Anesthesia Procedure Notes (Signed)
Procedure Name: LMA Insertion Date/Time: 04/28/2013 9:08 AM Performed by: Armandina Gemma Pre-anesthesia Checklist: Patient identified, Timeout performed, Emergency Drugs available, Suction available and Patient being monitored Patient Re-evaluated:Patient Re-evaluated prior to inductionOxygen Delivery Method: Circle system utilized Preoxygenation: Pre-oxygenation with 100% oxygen Intubation Type: IV induction LMA: LMA inserted LMA Size: 4.0 Tube type: Oral Number of attempts: 1 Placement Confirmation: positive ETCO2 and breath sounds checked- equal and bilateral ETT to lip (cm): 20 cc air. Tube secured with: Tape Dental Injury: Teeth and Oropharynx as per pre-operative assessment  Comments: IV induction [name]- LMA AM CRNA atraumatic

## 2013-04-28 NOTE — H&P (View-Only) (Signed)
This is a for continued followup of her foot. She did undergo an MRI 1 04/14/2013 of her left foot showing some enhancement of the distal phalangeal bone and the great and second toe. He continues to have severe pain mostly associated with her second toe. She is having weekly local wound care to this at her nursing facility  Past Medical History  Diagnosis Date  . Depression   . Neuropathy   . Mood disorder   . Hepatitis C   . TIA (transient ischemic attack)   . Pancreatitis chronic   . Chronic pain   . HIV (human immunodeficiency virus infection) 1990's  . Polysubstance abuse   . GERD (gastroesophageal reflux disease)   . Cardiomyopathy, nonischemic     EF 45%  . Hypertension   . Coronary artery disease   . Peripheral vascular disease   . Asthma     "used to when I was young" (02/20/2013)  . Type II diabetes mellitus   . Arthritis     "legs and arms" (02/20/2013)    History  Substance Use Topics  . Smoking status: Former Smoker -- 0.30 packs/day for 40 years    Types: Cigarettes    Quit date: 02/23/2013  . Smokeless tobacco: Never Used     Comment: 02/20/2013 offered smoing cessation materials; pt declines  . Alcohol Use: No     Comment: 02/20/2013 "used to have problems w/alcohol; haven't drank anything in 4 yr"    Family History  Problem Relation Age of Onset  . Coronary artery disease Father 70    No Known Allergies  Current outpatient prescriptions:ASPIRIN LOW DOSE 81 MG EC tablet, TAKE ONE TABLET BY MOUTH ONCE DAILY., Disp: 30 tablet, Rfl: 3;  atorvastatin (LIPITOR) 10 MG tablet, Take 10 mg by mouth at bedtime., Disp: , Rfl: ;  cetirizine (ZYRTEC) 10 MG tablet, Take 10 mg by mouth daily., Disp: , Rfl: ;  docusate sodium (COLACE) 100 MG capsule, Take 100 mg by mouth daily. , Disp: , Rfl:  efavirenz-emtricitabine-tenofovir (ATRIPLA) 600-200-300 MG per tablet, Take 1 tablet by mouth at bedtime. , Disp: , Rfl: ;  ferrous sulfate 325 (65 FE) MG tablet, Take 325 mg by mouth  daily with breakfast., Disp: , Rfl: ;  HYDROcodone-acetaminophen (NORCO/VICODIN) 5-325 MG per tablet, Take 1 tablet by mouth every 6 (six) hours as needed for pain (left foot pain). pain, Disp: 30 tablet, Rfl: 0 insulin glargine (LANTUS) 100 UNIT/ML injection, Inject 45 Units into the skin at bedtime. , Disp: , Rfl: ;  metoprolol succinate (TOPROL-XL) 50 MG 24 hr tablet, Take 1 tablet (50 mg total) by mouth daily. Take with or immediately following a meal., Disp: 30 tablet, Rfl: 4;  mirtazapine (REMERON) 15 MG tablet, Take 15 mg by mouth at bedtime., Disp: , Rfl: ;  Multiple Vitamin (DAILY VITE) TABS, Take 1 tablet by mouth daily., Disp: , Rfl:  omeprazole (PRILOSEC) 20 MG capsule, TAKE (1) CAPSULE BY MOUTH ONCE DAILY., Disp: 30 capsule, Rfl: 3;  ondansetron (ZOFRAN) 8 MG tablet, Take 1 tablet (8 mg total) by mouth every 8 (eight) hours as needed for nausea., Disp: 6 tablet, Rfl: 0 Pancrelipase, Lip-Prot-Amyl, (CREON) 6000 UNITS CPEP, Take 12,000 Units by mouth 3 (three) times daily. Patient takes 2 capsules three times daily with meals and 1 capsule three times daily with snacks, Disp: , Rfl: ;  polyethylene glycol (MIRALAX) powder, Take 17 g by mouth daily as needed. constipation, Disp: , Rfl: ;  cephALEXin (KEFLEX) 500 MG  capsule, Take 1 capsule (500 mg total) by mouth 4 (four) times daily., Disp: 56 capsule, Rfl: 0 desipramine (NORPRAMIN) 25 MG tablet, Take 25 mg by mouth at bedtime., Disp: , Rfl: ;  doxycycline (VIBRA-TABS) 100 MG tablet, Take 100 mg by mouth 2 (two) times daily., Disp: , Rfl: ;  nicotine (NICODERM CQ - DOSED IN MG/24 HR) 7 mg/24hr patch, Place 1 patch onto the skin daily., Disp: 28 patch, Rfl: 0;  traZODone (DESYREL) 100 MG tablet, Take 1 tablet (100 mg total) by mouth at bedtime., Disp: 30 tablet, Rfl: 3  BP 148/78  Pulse 78  Resp 18  Ht 5\' 4"  (1.626 m)  Wt 152 lb (68.947 kg)  BMI 26.08 kg/m2  Body mass index is 26.08 kg/(m^2).       On physical exam well-developed in no  acute distress. Respirations nonlabored. Her left foot size secondary dressing was removed and she does have bone exposed at where she has no prior loss her nail. There is no evidence of metatarsal head involvement  Doppler flow in her foot is quite brisk at peroneal anterior tibial and posterior tibial arteries.  Impression and plan: Continued severe pain in her left second toe due to the exposed bone. I have explained that she will require toe amputation. I did explain the potential for nonhealing of this and that she is still certainly at limb threatening ischemia. Recommended left toe amputations outpatient. She is not willing to proceed with that this week. She is willing to proceed with [place] next week and has been scheduled for surgery on 523 as an outpatient at [facility]

## 2013-04-28 NOTE — Transfer of Care (Signed)
Immediate Anesthesia Transfer of Care Note  Patient: [name]  Procedure(s) Performed: Procedure(s) with comments: AMPUTATION DIGIT (Left) - left 2nd toe amputation  Patient Location: PACU  Anesthesia Type:General  Level of Consciousness: awake and alert   Airway & Oxygen Therapy: Patient Spontanous Breathing and Patient connected to nasal cannula oxygen  Post-op Assessment: Report given to PACU RN and Post -op Vital signs reviewed and stable  Post vital signs: Reviewed and stable  Complications: No apparent anesthesia complications

## 2013-04-28 NOTE — Preoperative (Signed)
Beta Blockers   Reason not to administer Beta Blockers:Lopressor IV intra op per Krista Blue

## 2013-04-28 NOTE — Interval H&P Note (Signed)
History and Physical Interval Note:  04/28/2013 8:41 AM  [name]  has presented today for surgery, with the diagnosis of Pain Left 2nd Toe  Nonhealing Ulcer 2nd toe with exposed bone  The various methods of treatment have been discussed with the patient and family. After consideration of risks, benefits and other options for treatment, the patient has consented to  Procedure(s) with comments: AMPUTATION DIGIT (Left) - left 2nd toe amputation as a surgical intervention .  The patient's history has been reviewed, patient examined, no change in status, stable for surgery.  I have reviewed the patient's chart and labs.  Questions were answered to the patient's satisfaction.     Rex Magee

## 2013-04-28 NOTE — Anesthesia Preprocedure Evaluation (Addendum)
Anesthesia Evaluation  Patient identified by MRN, date of birth, ID band Patient awake    Reviewed: Allergy & Precautions, H&P , NPO status , Patient's Chart, lab work & pertinent test results  History of Anesthesia Complications Negative for: history of anesthetic complications  Airway Mallampati: II TM Distance: >3 FB Neck ROM: Full    Dental  (+) Poor Dentition and Dental Advisory Given   Pulmonary asthma , former smoker,    Pulmonary exam normal       Cardiovascular + CAD and + Peripheral Vascular Disease + dysrhythmias     Neuro/Psych PSYCHIATRIC DISORDERS Depression TIA Neuromuscular disease    GI/Hepatic GERD-  Medicated,(+)     substance abuse  alcohol use, Hepatitis -, C  Endo/Other  diabetes  Renal/GU negative Renal ROS     Musculoskeletal   Abdominal   Peds  Hematology negative hematology ROS (+) HIV,   Anesthesia Other Findings   Reproductive/Obstetrics                         Anesthesia Physical Anesthesia Plan  ASA: III  Anesthesia Plan: General   Post-op Pain Management:    Induction: Intravenous  Airway Management Planned: LMA  Additional Equipment:   Intra-op Plan:   Post-operative Plan: Extubation in OR  Informed Consent: I have reviewed the patients History and Physical, chart, labs and discussed the procedure including the risks, benefits and alternatives for the proposed anesthesia with the patient or authorized representative who has indicated his/her understanding and acceptance.   Dental advisory given  Plan Discussed with: CRNA, Anesthesiologist and Surgeon  Anesthesia Plan Comments:        Anesthesia Quick Evaluation

## 2013-04-30 ENCOUNTER — Emergency Department (HOSPITAL_COMMUNITY)
Admission: EM | Admit: 2013-04-30 | Discharge: 2013-04-30 | Disposition: A | Discharge status: Home / Self Care | Payer: Medicaid Other | Attending: Emergency Medicine | Admitting: Emergency Medicine

## 2013-04-30 ENCOUNTER — Encounter (HOSPITAL_COMMUNITY): Payer: Self-pay | Admitting: Emergency Medicine

## 2013-04-30 DIAGNOSIS — Z21 Asymptomatic human immunodeficiency virus [HIV] infection status: Secondary | ICD-10-CM | POA: Insufficient documentation

## 2013-04-30 DIAGNOSIS — Z8669 Personal history of other diseases of the nervous system and sense organs: Secondary | ICD-10-CM | POA: Insufficient documentation

## 2013-04-30 DIAGNOSIS — Z87891 Personal history of nicotine dependence: Secondary | ICD-10-CM | POA: Insufficient documentation

## 2013-04-30 DIAGNOSIS — Z862 Personal history of diseases of the blood and blood-forming organs and certain disorders involving the immune mechanism: Secondary | ICD-10-CM | POA: Insufficient documentation

## 2013-04-30 DIAGNOSIS — Z8659 Personal history of other mental and behavioral disorders: Secondary | ICD-10-CM | POA: Insufficient documentation

## 2013-04-30 DIAGNOSIS — M79609 Pain in unspecified limb: Secondary | ICD-10-CM | POA: Insufficient documentation

## 2013-04-30 DIAGNOSIS — Z794 Long term (current) use of insulin: Secondary | ICD-10-CM | POA: Insufficient documentation

## 2013-04-30 DIAGNOSIS — Z792 Long term (current) use of antibiotics: Secondary | ICD-10-CM | POA: Insufficient documentation

## 2013-04-30 DIAGNOSIS — G459 Transient cerebral ischemic attack, unspecified: Secondary | ICD-10-CM | POA: Insufficient documentation

## 2013-04-30 DIAGNOSIS — Z79899 Other long term (current) drug therapy: Secondary | ICD-10-CM | POA: Insufficient documentation

## 2013-04-30 DIAGNOSIS — E119 Type 2 diabetes mellitus without complications: Secondary | ICD-10-CM | POA: Insufficient documentation

## 2013-04-30 DIAGNOSIS — G8929 Other chronic pain: Secondary | ICD-10-CM | POA: Insufficient documentation

## 2013-04-30 DIAGNOSIS — G8918 Other acute postprocedural pain: Secondary | ICD-10-CM | POA: Insufficient documentation

## 2013-04-30 DIAGNOSIS — J45909 Unspecified asthma, uncomplicated: Secondary | ICD-10-CM | POA: Insufficient documentation

## 2013-04-30 DIAGNOSIS — Z8679 Personal history of other diseases of the circulatory system: Secondary | ICD-10-CM | POA: Insufficient documentation

## 2013-04-30 DIAGNOSIS — K219 Gastro-esophageal reflux disease without esophagitis: Secondary | ICD-10-CM | POA: Insufficient documentation

## 2013-04-30 DIAGNOSIS — I2510 Atherosclerotic heart disease of native coronary artery without angina pectoris: Secondary | ICD-10-CM | POA: Insufficient documentation

## 2013-04-30 DIAGNOSIS — Z8739 Personal history of other diseases of the musculoskeletal system and connective tissue: Secondary | ICD-10-CM | POA: Insufficient documentation

## 2013-04-30 DIAGNOSIS — Z8719 Personal history of other diseases of the digestive system: Secondary | ICD-10-CM | POA: Insufficient documentation

## 2013-04-30 MED ORDER — HYDROMORPHONE HCL PF 1 MG/ML IJ SOLN
1.0000 mg | Freq: Once | INTRAMUSCULAR | Status: AC
  Administered 2013-04-30: 1 mg via INTRAMUSCULAR
  Filled 2013-04-30: qty 1

## 2013-04-30 MED ORDER — OXYCODONE-ACETAMINOPHEN 5-325 MG PO TABS: 2.0000 | ORAL_TABLET | ORAL | Status: DC | PRN

## 2013-04-30 NOTE — ED Provider Notes (Signed)
History  This chart was scribed for Vida Roller, MD by Bennett Scrape, ED Scribe. This patient was seen in room APA03/APA03 and the patient's care was started at 10:29 PM.  CSN: [id number]  Arrival date & time 04/30/13  2144   First MD Initiated Contact with Patient 04/30/13 2224      Chief Complaint  Patient presents with  . Foot Pain     The history is provided by the patient. No language interpreter was used.    HPI Comments: [name] is a 57 y.o. female brought in by ambulance from [facility], who presents to the Emergency Department complaining of continuous pain associated with the second left toe which was amputated two days ago. The pt states that the toe was amputated because it was swelling but is extremely vague on further details. The bandage currently on the area has not yet been changed since the operation. She also states that the 5-325 mg Norco tablets which she was prescribed have not mitigated the pain, and she describes the pain of 10/10.  The pt reports that the pain is worsened with pressure. She denies a fever and drainage as associated symptoms.  She has a h/o of HIV, TIA, DM and neuropathy. She is a former smoker and drinker.   Past Medical History  Diagnosis Date  . Depression   . Neuropathy   . Mood disorder   . TIA (transient ischemic attack)   . Pancreatitis chronic   . Chronic pain   . HIV (human immunodeficiency virus infection) 1990's    takes Norpramin nightly  . Polysubstance abuse   . Cardiomyopathy, nonischemic     EF 45%  . Coronary artery disease   . Peripheral vascular disease   . Asthma     "used to when I was young" (02/20/2013)  . Arthritis     "legs and arms" (02/20/2013)  . Hypercholesterolemia     takes Lipitor nightly  . Seasonal allergies     takes Zyrtec daily  . Anemia     takes Iron pill daily  . Nausea     takes Zofran prn  . GERD (gastroesophageal reflux disease)     takes Prilosec daily  . Insomnia      takes Trazodone nightly  . Constipation     takes miralax prn and stool softener daily  . Type II diabetes mellitus     lantus 45units at bedtime  . Dysrhythmia     takes Metoprolol daily  . Weakness of both legs   . Peripheral neuropathy   . Chronic back pain   . Hepatitis C     C  . Urinary frequency   . Nocturia     Past Surgical History  Procedure Laterality Date  . Carotid endarterectomy Right   . Tubal ligation    . Lower extremity angiogram    . Toe amputation      Family History  Problem Relation Age of Onset  . Coronary artery disease Father 28    History  Substance Use Topics  . Smoking status: Former Smoker -- 0.25 packs/day for 40 years    Types: Cigarettes  . Smokeless tobacco: Never Used  . Alcohol Use: No     Comment: 02/20/2013 "used to have problems w/alcohol; haven't drank anything in 4 yr"    OB History   Grav Para Term Preterm Abortions TAB SAB Ect Mult Living   1 1 1  1      Review of Systems  Constitutional: Negative for fever.  Musculoskeletal: Positive for myalgias.       Pain around site where left second toes was amputated.     Allergies  Review of patient's allergies indicates no known allergies.  Home Medications   Current Outpatient Rx  Name  Route  Sig  Dispense  Refill  . aspirin EC 81 MG tablet   Oral   Take 81 mg by mouth daily.         . cetirizine (ZYRTEC) 10 MG tablet   Oral   Take 10 mg by mouth daily.         Marland Kitchen docusate sodium (COLACE) 100 MG capsule   Oral   Take 100 mg by mouth daily.          Marland Kitchen efavirenz-emtricitabine-tenofovir (ATRIPLA) 600-200-300 MG per tablet   Oral   Take 1 tablet by mouth at bedtime.          . ferrous sulfate 325 (65 FE) MG tablet   Oral   Take 325 mg by mouth daily with breakfast.         . HYDROcodone-acetaminophen (NORCO/VICODIN) 5-325 MG per tablet   Oral   Take 1-2 tablets by mouth every 4 (four) hours as needed for pain (left foot pain). pain   30  tablet   0   . insulin aspart (NOVOLOG FLEXPEN) 100 unit/mL SOLN FlexPen   Subcutaneous   Inject 10 Units into the skin 3 (three) times daily with meals. SSI. Plus 1 unit for every 30mg /dl above 100mg /dl         . insulin glargine (LANTUS) 100 UNIT/ML injection   Subcutaneous   Inject 45 Units into the skin at bedtime.          Marland Kitchen LORazepam (ATIVAN) 0.5 MG tablet   Oral   Take 0.5 mg by mouth 2 (two) times daily as needed for anxiety.         . metoprolol succinate (TOPROL-XL) 50 MG 24 hr tablet   Oral   Take 50 mg by mouth daily after breakfast. Take with or immediately following a meal.         . Multiple Vitamin (DAILY VITE) TABS   Oral   Take 1 tablet by mouth daily.         Marland Kitchen omeprazole (PRILOSEC) 20 MG capsule   Oral   Take 20 mg by mouth daily.         . ondansetron (ZOFRAN) 8 MG tablet   Oral   Take 8 mg by mouth every 8 (eight) hours as needed for nausea.         . Pancrelipase, Lip-Prot-Amyl, (CREON) 6000 UNITS CPEP   Oral   Take 6,000-12,000 Units by mouth 3 (three) times daily. Patient takes 2 capsules three times daily with meals and 1 capsule three times daily with snacks         . polyethylene glycol (MIRALAX) powder   Oral   Take 17 g by mouth daily. constipation         . oxyCODONE-acetaminophen (PERCOCET/ROXICET) 5-325 MG per tablet   Oral   Take 2 tablets by mouth every 4 (four) hours as needed for pain.   15 tablet   0     Triage Vitals: BP 105/54  Pulse 82  Temp(Src) 97.6 F (36.4 C) (Oral)  SpO2 100%  Physical Exam  Nursing note and vitals reviewed. Constitutional: She is oriented  to person, place, and time. She appears well-developed and well-nourished. She appears distressed.  HENT:  Head: Normocephalic and atraumatic.  Eyes: EOM are normal.  Neck: Neck supple. No tracheal deviation present.  Cardiovascular: Normal rate.   Pulmonary/Chest: Effort normal and breath sounds normal. No respiratory distress.   Musculoskeletal: Normal range of motion. She exhibits tenderness.  Left second toe is missing. No discharge, swelling, or  drainage. No erythema over site of operation. Tenderness over site of operation.   Neurological: She is alert and oriented to person, place, and time.  Skin: Skin is warm and dry.  Psychiatric: She has a normal mood and affect. Her behavior is normal.    ED Course  Procedures (including critical care time)  DIAGNOSTIC STUDIES: Oxygen Saturation is 100% on room air, normal by my interpretation.    COORDINATION OF CARE:  10:32 PM- Discussed treatment plan with pt which includes Informing pt that she will be given a different medication for her pain as well as a shot of pain medicine.  Pt agreed to plan.  Labs Reviewed - No data to display No results found.   1. Post-op pain       MDM  The patient is postoperative pain however her postop wound site looks very clean, no drainage, no swelling, soft compartments, no redness. She has no fever, no tachycardia and otherwise appears well. She has been given 1 mg of parenteral Dilaudid, she will be given a discharge prescription for Percocet which should be stronger than the hydrocodone which she currently takes.  Meds given in ED:  Medications  HYDROmorphone (DILAUDID) injection 1 mg (not administered)    New Prescriptions   OXYCODONE-ACETAMINOPHEN (PERCOCET/ROXICET) 5-325 MG PER TABLET    Take 2 tablets by mouth every 4 (four) hours as needed for pain.       I personally performed the services described in this documentation, which was scribed in my presence. The recorded information has been reviewed and is accurate.          Vida Roller, MD 04/30/13 [phone]

## 2013-04-30 NOTE — ED Notes (Signed)
Patient from [facility] states she had her 2nd left toe amputated 2 days ago and is complaining of continuing pain since procedure. Patient states pain medication hydrocodone is not working. Was last given pain medicine at 2000 tonight.

## 2013-05-03 ENCOUNTER — Encounter (HOSPITAL_COMMUNITY): Payer: Self-pay | Admitting: Vascular Surgery

## 2013-05-05 ENCOUNTER — Emergency Department (HOSPITAL_COMMUNITY)
Admission: EM | Admit: 2013-05-05 | Discharge: 2013-05-05 | Disposition: A | Discharge status: Home / Self Care | Payer: Medicaid Other | Attending: Emergency Medicine | Admitting: Emergency Medicine

## 2013-05-05 ENCOUNTER — Encounter (HOSPITAL_COMMUNITY): Payer: Self-pay | Admitting: *Deleted

## 2013-05-05 DIAGNOSIS — Z8679 Personal history of other diseases of the circulatory system: Secondary | ICD-10-CM | POA: Insufficient documentation

## 2013-05-05 DIAGNOSIS — K5900 Constipation, unspecified: Secondary | ICD-10-CM | POA: Insufficient documentation

## 2013-05-05 DIAGNOSIS — Z87891 Personal history of nicotine dependence: Secondary | ICD-10-CM | POA: Insufficient documentation

## 2013-05-05 DIAGNOSIS — F3289 Other specified depressive episodes: Secondary | ICD-10-CM | POA: Insufficient documentation

## 2013-05-05 DIAGNOSIS — M549 Dorsalgia, unspecified: Secondary | ICD-10-CM | POA: Insufficient documentation

## 2013-05-05 DIAGNOSIS — M25579 Pain in unspecified ankle and joints of unspecified foot: Secondary | ICD-10-CM | POA: Insufficient documentation

## 2013-05-05 DIAGNOSIS — Z79899 Other long term (current) drug therapy: Secondary | ICD-10-CM | POA: Insufficient documentation

## 2013-05-05 DIAGNOSIS — Z21 Asymptomatic human immunodeficiency virus [HIV] infection status: Secondary | ICD-10-CM | POA: Insufficient documentation

## 2013-05-05 DIAGNOSIS — Z794 Long term (current) use of insulin: Secondary | ICD-10-CM | POA: Insufficient documentation

## 2013-05-05 DIAGNOSIS — E7800 Pure hypercholesterolemia, unspecified: Secondary | ICD-10-CM | POA: Insufficient documentation

## 2013-05-05 DIAGNOSIS — Z7982 Long term (current) use of aspirin: Secondary | ICD-10-CM | POA: Insufficient documentation

## 2013-05-05 DIAGNOSIS — Z8619 Personal history of other infectious and parasitic diseases: Secondary | ICD-10-CM | POA: Insufficient documentation

## 2013-05-05 DIAGNOSIS — G4700 Insomnia, unspecified: Secondary | ICD-10-CM | POA: Insufficient documentation

## 2013-05-05 DIAGNOSIS — J45909 Unspecified asthma, uncomplicated: Secondary | ICD-10-CM | POA: Insufficient documentation

## 2013-05-05 DIAGNOSIS — Z87448 Personal history of other diseases of urinary system: Secondary | ICD-10-CM | POA: Insufficient documentation

## 2013-05-05 DIAGNOSIS — F329 Major depressive disorder, single episode, unspecified: Secondary | ICD-10-CM | POA: Insufficient documentation

## 2013-05-05 DIAGNOSIS — F411 Generalized anxiety disorder: Secondary | ICD-10-CM | POA: Insufficient documentation

## 2013-05-05 DIAGNOSIS — F39 Unspecified mood [affective] disorder: Secondary | ICD-10-CM | POA: Insufficient documentation

## 2013-05-05 DIAGNOSIS — Z8669 Personal history of other diseases of the nervous system and sense organs: Secondary | ICD-10-CM | POA: Insufficient documentation

## 2013-05-05 DIAGNOSIS — G8929 Other chronic pain: Secondary | ICD-10-CM | POA: Insufficient documentation

## 2013-05-05 DIAGNOSIS — G8918 Other acute postprocedural pain: Secondary | ICD-10-CM | POA: Insufficient documentation

## 2013-05-05 DIAGNOSIS — I2510 Atherosclerotic heart disease of native coronary artery without angina pectoris: Secondary | ICD-10-CM | POA: Insufficient documentation

## 2013-05-05 DIAGNOSIS — Z8719 Personal history of other diseases of the digestive system: Secondary | ICD-10-CM | POA: Insufficient documentation

## 2013-05-05 DIAGNOSIS — D649 Anemia, unspecified: Secondary | ICD-10-CM | POA: Insufficient documentation

## 2013-05-05 DIAGNOSIS — E119 Type 2 diabetes mellitus without complications: Secondary | ICD-10-CM | POA: Insufficient documentation

## 2013-05-05 DIAGNOSIS — K219 Gastro-esophageal reflux disease without esophagitis: Secondary | ICD-10-CM | POA: Insufficient documentation

## 2013-05-05 DIAGNOSIS — M129 Arthropathy, unspecified: Secondary | ICD-10-CM | POA: Insufficient documentation

## 2013-05-05 DIAGNOSIS — R450 Nervousness: Secondary | ICD-10-CM | POA: Insufficient documentation

## 2013-05-05 DIAGNOSIS — R002 Palpitations: Secondary | ICD-10-CM | POA: Insufficient documentation

## 2013-05-05 MED ORDER — DIPHENHYDRAMINE HCL 12.5 MG/5ML PO ELIX
12.5000 mg | ORAL_SOLUTION | Freq: Once | ORAL | Status: AC
  Administered 2013-05-05: 12.5 mg via ORAL
  Filled 2013-05-05: qty 5

## 2013-05-05 MED ORDER — ONDANSETRON HCL 4 MG PO TABS
4.0000 mg | ORAL_TABLET | Freq: Once | ORAL | Status: AC
  Administered 2013-05-05: 4 mg via ORAL
  Filled 2013-05-05: qty 1

## 2013-05-05 MED ORDER — HYDROXYZINE HCL 25 MG PO TABS: 25.0000 mg | ORAL_TABLET | Freq: Four times a day (QID) | ORAL | Status: DC

## 2013-05-05 MED ORDER — HYDROMORPHONE HCL PF 1 MG/ML IJ SOLN
1.0000 mg | Freq: Once | INTRAMUSCULAR | Status: AC
  Administered 2013-05-05: 1 mg via INTRAMUSCULAR
  Filled 2013-05-05: qty 1

## 2013-05-05 MED ORDER — OXYCODONE-ACETAMINOPHEN 5-325 MG PO TABS: 1.0000 | ORAL_TABLET | Freq: Four times a day (QID) | ORAL | Status: DC | PRN

## 2013-05-05 NOTE — ED Notes (Signed)
Had L toe amputation last Friday but was not given pain meds.  Has been to ER x 2 since surgery for pain medication and has run out again.  Unable to sleep d/t pain. Has recv'd both Vicodin and Percocet which have helped w/pain but have caused itching.  Scratch marks noted on R arm and bilateral lower extremities. Pain is 10/10.

## 2013-05-05 NOTE — ED Notes (Signed)
Pt with increased pain 10/10 in left foot, redness extending from surgical site and small ulcer to dorsum of foot. Pt reports PRN hydrocodone/apap are not working well to control pain, and patient is not on an antibiotic.

## 2013-05-05 NOTE — ED Notes (Deleted)
Patient fell at rest home 2-3 days ago.  Went to PMD today and x-ray today showed R trocanteric fracture.  R leg turned out.  Patient wearing bilateral leg braces.  Pain is 10/10, has only been taking Tylenol.

## 2013-05-05 NOTE — ED Provider Notes (Signed)
History     CSN: [id number]  Arrival date & time 05/05/13  1040   First MD Initiated Contact with Patient 05/05/13 1054      Chief Complaint  Patient presents with  . Foot Pain    (Consider location/radiation/quality/duration/timing/severity/associated sxs/prior treatment) HPI Comments: Patient is a 57 year old female with a history of insulin requiring diabetes mellitus and peripheral vascular disease who presents to the emergency department with complaint of pain of the left foot and in particularly the left toe amputation site. The patient had an amputation of the left second toe on may 23rd. Records indicate she was given Norco for pain but she states this did not help. She came to the emergency department on May 25 was evaluated. She was treated with Percocet for her pain which she states helped the pain, but caused her to have a lot of itching. The patient is now out of the Percocet. She states she has attempted to get in touch with the vascular surgeon who did her surgery but has been unsuccessful. She presents now for assistance with her pain. She has not had any high fever. She's not noticed any unusual drainage present. And she has not seen any red streaking going up the foot. She states that it hurts to walk on the foot and she has to walk on the side of the foot and not in her usual pattern.  Patient is a 57 y.o. female presenting with lower extremity pain. The history is provided by the patient.  Foot Pain Associated symptoms include arthralgias. Pertinent negatives include no abdominal pain, chest pain, coughing or neck pain.    Past Medical History  Diagnosis Date  . Depression   . Neuropathy   . Mood disorder   . TIA (transient ischemic attack)   . Pancreatitis chronic   . Chronic pain   . HIV (human immunodeficiency virus infection) 1990's    takes Norpramin nightly  . Polysubstance abuse   . Cardiomyopathy, nonischemic     EF 45%  . Coronary artery disease   .  Peripheral vascular disease   . Asthma     "used to when I was young" (02/20/2013)  . Arthritis     "legs and arms" (02/20/2013)  . Hypercholesterolemia     takes Lipitor nightly  . Seasonal allergies     takes Zyrtec daily  . Anemia     takes Iron pill daily  . Nausea     takes Zofran prn  . GERD (gastroesophageal reflux disease)     takes Prilosec daily  . Insomnia     takes Trazodone nightly  . Constipation     takes miralax prn and stool softener daily  . Type II diabetes mellitus     lantus 45units at bedtime  . Dysrhythmia     takes Metoprolol daily  . Weakness of both legs   . Peripheral neuropathy   . Chronic back pain   . Hepatitis C     C  . Urinary frequency   . Nocturia     Past Surgical History  Procedure Laterality Date  . Carotid endarterectomy Right   . Tubal ligation    . Lower extremity angiogram    . Toe amputation    . Amputation Left 04/28/2013    Procedure: AMPUTATION DIGIT;  Surgeon: Larina Earthly, MD;  Location: [facility] OR;  Service: Vascular;  Laterality: Left;  left 2nd toe amputation    Family History  Problem Relation Age of  Onset  . Coronary artery disease Father 33    History  Substance Use Topics  . Smoking status: Former Smoker -- 0.25 packs/day for 40 years    Types: Cigarettes  . Smokeless tobacco: Never Used  . Alcohol Use: No     Comment: 02/20/2013 "used to have problems w/alcohol; haven't drank anything in 4 yr"    OB History   Grav Para Term Preterm Abortions TAB SAB Ect Mult Living   1 1 1       1       Review of Systems  Constitutional: Negative for activity change.       All ROS Neg except as noted in HPI  HENT: Negative for nosebleeds and neck pain.   Eyes: Negative for photophobia and discharge.  Respiratory: Negative for cough, shortness of breath and wheezing.   Cardiovascular: Positive for palpitations. Negative for chest pain.  Gastrointestinal: Positive for constipation. Negative for abdominal pain and blood in  stool.  Genitourinary: Negative for dysuria, frequency and hematuria.  Musculoskeletal: Positive for back pain and arthralgias.  Skin: Positive for wound.  Neurological: Negative for dizziness, seizures and speech difficulty.  Psychiatric/Behavioral: Negative for hallucinations and confusion. The patient is nervous/anxious.        Depression    Allergies  Review of patient's allergies indicates no known allergies.  Home Medications   Current Outpatient Rx  Name  Route  Sig  Dispense  Refill  . aspirin EC 81 MG tablet   Oral   Take 81 mg by mouth daily.         Marland Kitchen atorvastatin (LIPITOR) 10 MG tablet   Oral   Take 10 mg by mouth at bedtime.         . cetirizine (ZYRTEC) 10 MG tablet   Oral   Take 10 mg by mouth daily.         Marland Kitchen docusate sodium (COLACE) 100 MG capsule   Oral   Take 100 mg by mouth daily.          Marland Kitchen efavirenz-emtricitabine-tenofovir (ATRIPLA) 600-200-300 MG per tablet   Oral   Take 1 tablet by mouth at bedtime.          . ferrous sulfate 325 (65 FE) MG tablet   Oral   Take 325 mg by mouth daily with breakfast.         . HYDROcodone-acetaminophen (NORCO/VICODIN) 5-325 MG per tablet   Oral   Take 1-2 tablets by mouth every 4 (four) hours as needed for pain (left foot pain). pain   30 tablet   0   . insulin aspart (NOVOLOG FLEXPEN) 100 unit/mL SOLN FlexPen   Subcutaneous   Inject 10 Units into the skin 3 (three) times daily with meals. SSI. Plus 1 unit for every 30mg /dl above 100mg /dl         . insulin glargine (LANTUS) 100 UNIT/ML injection   Subcutaneous   Inject 45 Units into the skin at bedtime.          . metoprolol succinate (TOPROL-XL) 50 MG 24 hr tablet   Oral   Take 50 mg by mouth daily after breakfast. Take with or immediately following a meal.         . Multiple Vitamin (DAILY VITE) TABS   Oral   Take 1 tablet by mouth daily.         . mupirocin ointment (BACTROBAN) 2 %   Topical   Apply 1 application topically 2  (two) times  daily. Patient was to use this twice daily for 5 days.  Mar states patient finished this regimen on 04/30/2013         . omeprazole (PRILOSEC) 20 MG capsule   Oral   Take 20 mg by mouth daily.         Marland Kitchen oxyCODONE-acetaminophen (PERCOCET/ROXICET) 5-325 MG per tablet   Oral   Take 2 tablets by mouth every 4 (four) hours as needed for pain.   15 tablet   0   . Pancrelipase, Lip-Prot-Amyl, (CREON) 6000 UNITS CPEP   Oral   Take 6,000-12,000 Units by mouth 3 (three) times daily. Patient takes 2 capsules three times daily with meals and 1 capsule three times daily with snacks         . LORazepam (ATIVAN) 0.5 MG tablet   Oral   Take 0.5 mg by mouth 2 (two) times daily as needed for anxiety.         . ondansetron (ZOFRAN) 8 MG tablet   Oral   Take 8 mg by mouth every 8 (eight) hours as needed for nausea.         . polyethylene glycol (MIRALAX) powder   Oral   Take 17 g by mouth daily. constipation           BP 139/86  Pulse 95  Temp(Src) 98.6 F (37 C) (Oral)  Resp 18  Ht 4\' 9"  (1.448 m)  Wt 151 lb (68.493 kg)  BMI 32.67 kg/m2  SpO2 99%  Physical Exam  Nursing note and vitals reviewed. Constitutional: She is oriented to person, place, and time. She appears well-developed and well-nourished.  Non-toxic appearance. She appears distressed.  Patient rocking back-and-forth and crying in pain.  HENT:  Head: Normocephalic.  Right Ear: Tympanic membrane and external ear normal.  Left Ear: Tympanic membrane and external ear normal.  Eyes: EOM and lids are normal. Pupils are equal, round, and reactive to light.  Neck: Normal range of motion. Neck supple. Carotid bruit is not present.  Cardiovascular: Normal rate, regular rhythm, normal heart sounds, intact distal pulses and normal pulses.   Pulmonary/Chest: Breath sounds normal. No respiratory distress.  Abdominal: Soft. Bowel sounds are normal. There is no tenderness. There is no guarding.  Musculoskeletal:  Normal range of motion.  Patient has scratch marks of both lower extremities. The lower extremities are cool but not cold. The left second toe has been surgically removed. The stitches remain in place. There no red streaks going up the foot. The foot is not hot. There is no drainage appreciated from the surgical site. Patient complains of pain with palpation of the left foot. 1+ edema present. No crepitus appreciated.  Lymphadenopathy:       Head (right side): No submandibular adenopathy present.       Head (left side): No submandibular adenopathy present.    She has no cervical adenopathy.  Neurological: She is alert and oriented to person, place, and time. She has normal strength. No cranial nerve deficit or sensory deficit.  Skin: Skin is warm and dry.  Psychiatric: She has a normal mood and affect. Her speech is normal.    ED Course  Procedures (including critical care time)  Labs Reviewed - No data to display No results found.   No diagnosis found.    MDM  I have reviewed nursing notes, vital signs, and all appropriate lab and imaging results for this patient.  I have spoken with Dr. Remonia Richter office, and they state that  the patient has an appointment for June 3 at 8:30 AM. I discussed this with the patient. Prescription for Percocet and Vistaril given to the patient. Patient advised of these medications may cause drowsiness and constipation. Patient advised to use these with caution. Patient also advised to see her primary physician or return to the emergency department if any signs of infection involving her amputation site.       Kathie Dike, PA-C 05/05/13 1304

## 2013-05-08 ENCOUNTER — Encounter: Payer: Self-pay | Admitting: Vascular Surgery

## 2013-05-09 ENCOUNTER — Encounter: Payer: Self-pay | Admitting: Vascular Surgery

## 2013-05-09 ENCOUNTER — Encounter (HOSPITAL_COMMUNITY): Payer: Self-pay | Admitting: General Practice

## 2013-05-09 ENCOUNTER — Ambulatory Visit (INDEPENDENT_AMBULATORY_CARE_PROVIDER_SITE_OTHER): Payer: Medicaid Other | Admitting: Vascular Surgery

## 2013-05-09 ENCOUNTER — Inpatient Hospital Stay (HOSPITAL_COMMUNITY)
Admission: AD | Admit: 2013-05-09 | Discharge: 2013-05-17 | DRG: 240 | Disposition: A | Discharge status: Rehabilitation Facility | Payer: Medicaid Other | Source: Ambulatory Visit | Attending: Vascular Surgery | Admitting: Vascular Surgery

## 2013-05-09 VITALS — BP 87/59 | HR 101 | Temp 98.1°F | Ht <= 58 in | Wt 151.0 lb

## 2013-05-09 DIAGNOSIS — I999 Unspecified disorder of circulatory system: Secondary | ICD-10-CM

## 2013-05-09 DIAGNOSIS — K5900 Constipation, unspecified: Secondary | ICD-10-CM | POA: Diagnosis present

## 2013-05-09 DIAGNOSIS — I428 Other cardiomyopathies: Secondary | ICD-10-CM | POA: Diagnosis present

## 2013-05-09 DIAGNOSIS — Z79899 Other long term (current) drug therapy: Secondary | ICD-10-CM

## 2013-05-09 DIAGNOSIS — Z7982 Long term (current) use of aspirin: Secondary | ICD-10-CM

## 2013-05-09 DIAGNOSIS — I2510 Atherosclerotic heart disease of native coronary artery without angina pectoris: Secondary | ICD-10-CM | POA: Diagnosis present

## 2013-05-09 DIAGNOSIS — G8929 Other chronic pain: Secondary | ICD-10-CM | POA: Diagnosis present

## 2013-05-09 DIAGNOSIS — B1920 Unspecified viral hepatitis C without hepatic coma: Secondary | ICD-10-CM | POA: Diagnosis present

## 2013-05-09 DIAGNOSIS — K219 Gastro-esophageal reflux disease without esophagitis: Secondary | ICD-10-CM | POA: Diagnosis present

## 2013-05-09 DIAGNOSIS — Z87891 Personal history of nicotine dependence: Secondary | ICD-10-CM

## 2013-05-09 DIAGNOSIS — M79609 Pain in unspecified limb: Secondary | ICD-10-CM | POA: Insufficient documentation

## 2013-05-09 DIAGNOSIS — G609 Hereditary and idiopathic neuropathy, unspecified: Secondary | ICD-10-CM | POA: Diagnosis present

## 2013-05-09 DIAGNOSIS — I998 Other disorder of circulatory system: Secondary | ICD-10-CM

## 2013-05-09 DIAGNOSIS — Z794 Long term (current) use of insulin: Secondary | ICD-10-CM

## 2013-05-09 DIAGNOSIS — Y836 Removal of other organ (partial) (total) as the cause of abnormal reaction of the patient, or of later complication, without mention of misadventure at the time of the procedure: Secondary | ICD-10-CM | POA: Diagnosis present

## 2013-05-09 DIAGNOSIS — G4700 Insomnia, unspecified: Secondary | ICD-10-CM | POA: Diagnosis present

## 2013-05-09 DIAGNOSIS — Z21 Asymptomatic human immunodeficiency virus [HIV] infection status: Secondary | ICD-10-CM | POA: Diagnosis present

## 2013-05-09 DIAGNOSIS — I70269 Atherosclerosis of native arteries of extremities with gangrene, unspecified extremity: Principal | ICD-10-CM | POA: Diagnosis present

## 2013-05-09 DIAGNOSIS — R110 Nausea: Secondary | ICD-10-CM | POA: Diagnosis present

## 2013-05-09 DIAGNOSIS — D62 Acute posthemorrhagic anemia: Secondary | ICD-10-CM | POA: Diagnosis not present

## 2013-05-09 DIAGNOSIS — M129 Arthropathy, unspecified: Secondary | ICD-10-CM | POA: Diagnosis present

## 2013-05-09 DIAGNOSIS — E7800 Pure hypercholesterolemia, unspecified: Secondary | ICD-10-CM | POA: Diagnosis present

## 2013-05-09 DIAGNOSIS — M549 Dorsalgia, unspecified: Secondary | ICD-10-CM | POA: Diagnosis present

## 2013-05-09 DIAGNOSIS — E119 Type 2 diabetes mellitus without complications: Secondary | ICD-10-CM | POA: Diagnosis present

## 2013-05-09 LAB — URINALYSIS, ROUTINE W REFLEX MICROSCOPIC
Leukocytes, UA: NEGATIVE
Nitrite: NEGATIVE
Specific Gravity, Urine: 1.025 (ref 1.005–1.030)
pH: 6.5 (ref 5.0–8.0)

## 2013-05-09 LAB — CBC
MCV: 89.6 fL (ref 78.0–100.0)
Platelets: 337 10*3/uL (ref 150–400)
RDW: 13.8 % (ref 11.5–15.5)
WBC: 18.9 10*3/uL — ABNORMAL HIGH (ref 4.0–10.5)

## 2013-05-09 LAB — HEMOGLOBIN A1C
Hgb A1c MFr Bld: 6.2 % — ABNORMAL HIGH (ref <5.7)
Mean Plasma Glucose: 131 mg/dL — ABNORMAL HIGH (ref <117)

## 2013-05-09 LAB — GLUCOSE, CAPILLARY
Glucose-Capillary: 151 mg/dL — ABNORMAL HIGH (ref 70–99)
Glucose-Capillary: 188 mg/dL — ABNORMAL HIGH (ref 70–99)
Glucose-Capillary: 74 mg/dL (ref 70–99)

## 2013-05-09 LAB — COMPREHENSIVE METABOLIC PANEL
AST: 36 U/L (ref 0–37)
Albumin: 2.9 g/dL — ABNORMAL LOW (ref 3.5–5.2)
Calcium: 9.2 mg/dL (ref 8.4–10.5)
Chloride: 102 mEq/L (ref 96–112)
Creatinine, Ser: 0.81 mg/dL (ref 0.50–1.10)
Total Bilirubin: 0.3 mg/dL (ref 0.3–1.2)

## 2013-05-09 LAB — URINE MICROSCOPIC-ADD ON

## 2013-05-09 LAB — PROTIME-INR: INR: 0.93 (ref 0.00–1.49)

## 2013-05-09 MED ORDER — INSULIN GLARGINE 100 UNIT/ML ~~LOC~~ SOLN
45.0000 [IU] | Freq: Every day | SUBCUTANEOUS | Status: DC
  Administered 2013-05-09: 45 [IU] via SUBCUTANEOUS
  Filled 2013-05-09 (×2): qty 0.45

## 2013-05-09 MED ORDER — MORPHINE SULFATE 2 MG/ML IJ SOLN: 2.0000 mg | INTRAMUSCULAR | Status: DC | PRN

## 2013-05-09 MED ORDER — DEXTROSE 5 % IV SOLN
1.5000 g | INTRAVENOUS | Status: AC
  Administered 2013-05-10: 1.5 g via INTRAVENOUS
  Filled 2013-05-09: qty 1.5

## 2013-05-09 MED ORDER — POTASSIUM CHLORIDE CRYS ER 20 MEQ PO TBCR
20.0000 meq | EXTENDED_RELEASE_TABLET | Freq: Once | ORAL | Status: AC
  Administered 2013-05-09: 40 meq via ORAL
  Filled 2013-05-09: qty 2

## 2013-05-09 MED ORDER — ACETAMINOPHEN 650 MG RE SUPP: 325.0000 mg | RECTAL | Status: DC | PRN

## 2013-05-09 MED ORDER — METOPROLOL TARTRATE 1 MG/ML IV SOLN: 2.0000 mg | INTRAVENOUS | Status: DC | PRN

## 2013-05-09 MED ORDER — PANCRELIPASE (LIP-PROT-AMYL) 12000-38000 UNITS PO CPEP
1.0000 | ORAL_CAPSULE | Freq: Three times a day (TID) | ORAL | Status: DC
  Administered 2013-05-09 – 2013-05-17 (×19): 1 via ORAL
  Filled 2013-05-09 (×27): qty 1

## 2013-05-09 MED ORDER — ATORVASTATIN CALCIUM 10 MG PO TABS
10.0000 mg | ORAL_TABLET | Freq: Every day | ORAL | Status: DC
  Administered 2013-05-09: 10 mg via ORAL
  Filled 2013-05-09 (×2): qty 1

## 2013-05-09 MED ORDER — ASPIRIN EC 81 MG PO TBEC
81.0000 mg | DELAYED_RELEASE_TABLET | Freq: Every day | ORAL | Status: DC
  Administered 2013-05-09: 81 mg via ORAL
  Filled 2013-05-09 (×2): qty 1

## 2013-05-09 MED ORDER — ONDANSETRON HCL 4 MG/2ML IJ SOLN: 4.0000 mg | Freq: Four times a day (QID) | INTRAMUSCULAR | Status: DC | PRN

## 2013-05-09 MED ORDER — FERROUS SULFATE 325 (65 FE) MG PO TABS
325.0000 mg | ORAL_TABLET | Freq: Every day | ORAL | Status: DC
  Administered 2013-05-09 – 2013-05-10 (×2): 325 mg via ORAL
  Filled 2013-05-09 (×3): qty 1

## 2013-05-09 MED ORDER — INSULIN ASPART 100 UNIT/ML FLEXPEN
10.0000 [IU] | PEN_INJECTOR | Freq: Three times a day (TID) | SUBCUTANEOUS | Status: DC
  Filled 2013-05-09: qty 3

## 2013-05-09 MED ORDER — EFAVIRENZ-EMTRICITAB-TENOFOVIR 600-200-300 MG PO TABS
1.0000 | ORAL_TABLET | Freq: Every day | ORAL | Status: DC
  Administered 2013-05-09: 1 via ORAL
  Filled 2013-05-09 (×3): qty 1

## 2013-05-09 MED ORDER — METOPROLOL SUCCINATE ER 50 MG PO TB24
50.0000 mg | ORAL_TABLET | Freq: Every day | ORAL | Status: DC
  Administered 2013-05-09: 50 mg via ORAL
  Filled 2013-05-09 (×3): qty 1

## 2013-05-09 MED ORDER — DOCUSATE SODIUM 100 MG PO CAPS
100.0000 mg | ORAL_CAPSULE | Freq: Every day | ORAL | Status: DC
  Filled 2013-05-09: qty 1

## 2013-05-09 MED ORDER — POLYETHYLENE GLYCOL 3350 17 GM/SCOOP PO POWD
17.0000 g | Freq: Every day | ORAL | Status: DC
  Filled 2013-05-09: qty 255

## 2013-05-09 MED ORDER — SODIUM CHLORIDE 0.9 % IJ SOLN
3.0000 mL | Freq: Two times a day (BID) | INTRAMUSCULAR | Status: DC
  Administered 2013-05-09: 3 mL via INTRAVENOUS

## 2013-05-09 MED ORDER — SODIUM CHLORIDE 0.9 % IJ SOLN: 9.0000 mL | INTRAMUSCULAR | Status: DC | PRN

## 2013-05-09 MED ORDER — PHENOL 1.4 % MT LIQD
1.0000 | OROMUCOSAL | Status: DC | PRN
  Filled 2013-05-09: qty 177

## 2013-05-09 MED ORDER — OXYCODONE-ACETAMINOPHEN 5-325 MG PO TABS
2.0000 | ORAL_TABLET | ORAL | Status: DC | PRN
  Administered 2013-05-09: 2 via ORAL
  Filled 2013-05-09: qty 2

## 2013-05-09 MED ORDER — LORATADINE 10 MG PO TABS
10.0000 mg | ORAL_TABLET | Freq: Every day | ORAL | Status: DC
  Administered 2013-05-09: 10 mg via ORAL
  Filled 2013-05-09 (×2): qty 1

## 2013-05-09 MED ORDER — INSULIN ASPART 100 UNIT/ML ~~LOC~~ SOLN: 0.0000 [IU] | Freq: Three times a day (TID) | SUBCUTANEOUS | Status: DC

## 2013-05-09 MED ORDER — DIPHENHYDRAMINE HCL 50 MG/ML IJ SOLN: 12.5000 mg | Freq: Four times a day (QID) | INTRAMUSCULAR | Status: DC | PRN

## 2013-05-09 MED ORDER — NALOXONE HCL 0.4 MG/ML IJ SOLN: 0.4000 mg | INTRAMUSCULAR | Status: DC | PRN

## 2013-05-09 MED ORDER — PANCRELIPASE (LIP-PROT-AMYL) 12000-38000 UNITS PO CPEP
2.0000 | ORAL_CAPSULE | Freq: Three times a day (TID) | ORAL | Status: DC
  Administered 2013-05-09 – 2013-05-17 (×25): 2 via ORAL
  Filled 2013-05-09 (×27): qty 2

## 2013-05-09 MED ORDER — HYDROXYZINE HCL 25 MG PO TABS
25.0000 mg | ORAL_TABLET | Freq: Four times a day (QID) | ORAL | Status: DC
  Administered 2013-05-09 – 2013-05-10 (×4): 25 mg via ORAL
  Filled 2013-05-09 (×9): qty 1

## 2013-05-09 MED ORDER — LORAZEPAM 0.5 MG PO TABS: 0.5000 mg | ORAL_TABLET | Freq: Two times a day (BID) | ORAL | Status: DC | PRN

## 2013-05-09 MED ORDER — BISACODYL 10 MG RE SUPP: 10.0000 mg | Freq: Every day | RECTAL | Status: DC | PRN

## 2013-05-09 MED ORDER — SODIUM CHLORIDE 0.9 % IJ SOLN: 3.0000 mL | INTRAMUSCULAR | Status: DC | PRN

## 2013-05-09 MED ORDER — INSULIN ASPART 100 UNIT/ML ~~LOC~~ SOLN
0.0000 [IU] | Freq: Three times a day (TID) | SUBCUTANEOUS | Status: DC
  Administered 2013-05-09 – 2013-05-11 (×5): 3 [IU] via SUBCUTANEOUS
  Administered 2013-05-12: 5 [IU] via SUBCUTANEOUS
  Administered 2013-05-12: 3 [IU] via SUBCUTANEOUS
  Administered 2013-05-12 – 2013-05-13 (×2): 5 [IU] via SUBCUTANEOUS
  Administered 2013-05-13 – 2013-05-14 (×4): 8 [IU] via SUBCUTANEOUS
  Administered 2013-05-14 – 2013-05-15 (×2): 5 [IU] via SUBCUTANEOUS
  Administered 2013-05-15: 11 [IU] via SUBCUTANEOUS
  Administered 2013-05-15: 8 [IU] via SUBCUTANEOUS
  Administered 2013-05-16: 3 [IU] via SUBCUTANEOUS
  Administered 2013-05-16 (×2): 11 [IU] via SUBCUTANEOUS
  Administered 2013-05-17: 5 [IU] via SUBCUTANEOUS
  Administered 2013-05-17: 15 [IU] via SUBCUTANEOUS
  Administered 2013-05-17: 11 [IU] via SUBCUTANEOUS

## 2013-05-09 MED ORDER — ONDANSETRON HCL 4 MG PO TABS: 8.0000 mg | ORAL_TABLET | Freq: Three times a day (TID) | ORAL | Status: DC | PRN

## 2013-05-09 MED ORDER — LABETALOL HCL 5 MG/ML IV SOLN
10.0000 mg | INTRAVENOUS | Status: DC | PRN
  Filled 2013-05-09: qty 4

## 2013-05-09 MED ORDER — ENOXAPARIN SODIUM 40 MG/0.4ML ~~LOC~~ SOLN
40.0000 mg | SUBCUTANEOUS | Status: DC
  Administered 2013-05-09: 40 mg via SUBCUTANEOUS
  Filled 2013-05-09 (×2): qty 0.4

## 2013-05-09 MED ORDER — MORPHINE SULFATE (PF) 1 MG/ML IV SOLN
INTRAVENOUS | Status: DC
  Administered 2013-05-09: 23:00:00 via INTRAVENOUS
  Administered 2013-05-09: 7 mg via INTRAVENOUS
  Administered 2013-05-09: 3 mg via INTRAVENOUS
  Administered 2013-05-09: 15:00:00 via INTRAVENOUS
  Administered 2013-05-10: 5 mg via INTRAVENOUS
  Administered 2013-05-10: 6 mg via INTRAVENOUS
  Filled 2013-05-09 (×2): qty 25

## 2013-05-09 MED ORDER — PANCRELIPASE (LIP-PROT-AMYL) 6000-19000 UNITS PO CPEP: 6000.0000 [IU] | ORAL_CAPSULE | Freq: Three times a day (TID) | ORAL | Status: DC

## 2013-05-09 MED ORDER — PANTOPRAZOLE SODIUM 40 MG PO TBEC
40.0000 mg | DELAYED_RELEASE_TABLET | Freq: Every day | ORAL | Status: DC
  Administered 2013-05-09: 40 mg via ORAL
  Filled 2013-05-09: qty 1

## 2013-05-09 MED ORDER — SODIUM CHLORIDE 0.9 % IV SOLN: 250.0000 mL | INTRAVENOUS | Status: DC | PRN

## 2013-05-09 MED ORDER — HYDRALAZINE HCL 20 MG/ML IJ SOLN: 10.0000 mg | INTRAMUSCULAR | Status: DC | PRN

## 2013-05-09 MED ORDER — ACETAMINOPHEN 325 MG PO TABS: 325.0000 mg | ORAL_TABLET | ORAL | Status: DC | PRN

## 2013-05-09 MED ORDER — DIPHENHYDRAMINE HCL 12.5 MG/5ML PO ELIX
12.5000 mg | ORAL_SOLUTION | Freq: Four times a day (QID) | ORAL | Status: DC | PRN
  Filled 2013-05-09: qty 5

## 2013-05-09 MED ORDER — MUPIROCIN 2 % EX OINT
1.0000 "application " | TOPICAL_OINTMENT | Freq: Two times a day (BID) | CUTANEOUS | Status: DC
  Administered 2013-05-09 (×2): 1 via TOPICAL
  Filled 2013-05-09: qty 22

## 2013-05-09 MED ORDER — POLYETHYLENE GLYCOL 3350 17 G PO PACK
17.0000 g | PACK | Freq: Every day | ORAL | Status: DC
  Administered 2013-05-11 – 2013-05-16 (×5): 17 g via ORAL
  Filled 2013-05-09 (×9): qty 1

## 2013-05-09 NOTE — Progress Notes (Signed)
Inpatient Diabetes Program Recommendations  AACE/ADA: New Consensus Statement on Inpatient Glycemic Control (2013)  Target Ranges:  Prepandial:   less than 140 mg/dL      Peak postprandial:   less than 180 mg/dL (1-2 hours)      Critically ill patients:  140 - 180 mg/dL   Reason for Consult: Suggestions on sliding scale correction and patient medications  Inpatient Diabetes Program Recommendations Insulin - Basal: Patient takes Lantus 45 units at HS at [facility] Correction (SSI): Patient takes Novolog 1 unit for every 30 mg/dl > 161 mg/dl.  This would be most comparable to sensitive correction scale. Insulin - Meal Coverage: Patient takes Novolog 10 units tid with meals in addition to correction scale.  May consider giving less meal coverage now and titrating upward as indicated.  Could begin with Novolog 5 units tid with meals.  HgbA1C: Pending.  Last Hgb A1C was 6.7. Diet: On renal diet  Note: Recommend the following: After surgery may need titration of Lantus up or down based on pre-breakfast CBG (amputation will affect body weight which may affect dose, but surgery may increase needs temporarily)  Sensitive correction scale.  Titrate based on CBG's.  Novolog meal coverage 5 units tid with meals in addition to correction unless CBG < 80 mg/dl or patient eats < 09% of meal.  Titrate up or down as indicated by CBG's.  Thank you for the consult.   Jessamyn Watterson S. Elsie Lincoln, RN, CNS, CDE Inpatient Diabetes Program, team pager [phone]

## 2013-05-09 NOTE — ED Provider Notes (Signed)
Medical screening examination/treatment/procedure(s) were performed by non-physician practitioner and as supervising physician I was immediately available for consultation/collaboration.  Maesyn Frisinger, MD 05/09/13 1514 

## 2013-05-09 NOTE — Progress Notes (Signed)
Patient is a for followup of left second toe amputation. She has known severe tibial vessel disease. She had angioplasty of popliteal artery. She has unfortunately had extensive gangrene of her foot with tissue loss extending up the dorsum of her foot. She is quite great deal of tenderness and erythema. She clearly does not have a viable foot. Discuss this with the patient explained she will require below-knee amputation. We had discussed this with the possible outcome with her known distal disease. She will be admitted to the hospital today for pain control IV antibiotics and left below-knee amputation tomorrow

## 2013-05-09 NOTE — H&P (Signed)
[facility] [place]   History of Present Illness  [name] is a 57 y.o. (08-11-1956) female who presents with chief complaint: pain in the left foot.  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  Her right foot became hypersensitive due to pain and erythematous.  The dorsum of the foot has yellow eschar/blister formation.  She has agreed to have a left BKA tomorrow.  We are admitting her for pre-op pain control and hydration.    Past Medical History  Diagnosis Date  . Depression   . Neuropathy   . Mood disorder   . TIA (transient ischemic attack)   . Pancreatitis chronic   . Chronic pain   . HIV (human immunodeficiency virus infection) 1990's    takes Norpramin nightly  . Polysubstance abuse   . Cardiomyopathy, nonischemic     EF 45%  . Coronary artery disease   . Peripheral vascular disease   . Asthma     "used to when I was young" (02/20/2013)  . Arthritis     "legs and arms" (02/20/2013)  . Hypercholesterolemia     takes Lipitor nightly  . Seasonal allergies     takes Zyrtec daily  . Anemia     takes Iron pill daily  . Nausea     takes Zofran prn  . GERD (gastroesophageal reflux disease)     takes Prilosec daily  . Insomnia     takes Trazodone nightly  . Constipation     takes miralax prn and stool softener daily  . Type II diabetes mellitus     lantus 45units at bedtime  . Dysrhythmia     takes Metoprolol daily  . Weakness of both legs   . Peripheral neuropathy   . Chronic back pain   . Hepatitis C     C  . Urinary frequency   . Nocturia     Past Surgical History  Procedure Laterality Date  . Carotid endarterectomy Right   . Tubal ligation    . Lower extremity angiogram    . Toe amputation    . Amputation Left 04/28/2013    Procedure: AMPUTATION DIGIT;  Surgeon: Larina Earthly, MD;  Location: [facility] OR;  Service: Vascular;  Laterality: Left;  left 2nd toe amputation    History   Social History  . Marital  Status: Widowed    Spouse Name: N/A    Number of Children: N/A  . Years of Education: 12th grade   Occupational History  .     Social History Main Topics  . Smoking status: Former Smoker -- 0.25 packs/day for 40 years    Types: Cigarettes  . Smokeless tobacco: Never Used  . Alcohol Use: No     Comment: 02/20/2013 "used to have problems w/alcohol; haven't drank anything in 4 yr"  . Drug Use: No     Comment: 02/20/2013 "none in about 4 yr"  . Sexually Active: No     Comment: pt. declined condoms   Other Topics Concern  . Not on file   Social History Narrative   Alcohol use- denies currently    Hx of cocaine use - pt denies current illicit drug use   Lives in ALF    Family History  Problem Relation Age of Onset  . Coronary artery disease Father 32      No current facility-administered medications on file prior to encounter.   Current Outpatient Prescriptions on File Prior to  Encounter  Medication Sig Dispense Refill  . aspirin EC 81 MG tablet Take 81 mg by mouth daily.      Marland Kitchen atorvastatin (LIPITOR) 10 MG tablet Take 10 mg by mouth at bedtime.      . cetirizine (ZYRTEC) 10 MG tablet Take 10 mg by mouth daily.      Marland Kitchen docusate sodium (COLACE) 100 MG capsule Take 100 mg by mouth daily.       Marland Kitchen efavirenz-emtricitabine-tenofovir (ATRIPLA) 600-200-300 MG per tablet Take 1 tablet by mouth at bedtime.       . ferrous sulfate 325 (65 FE) MG tablet Take 325 mg by mouth daily with breakfast.      . HYDROcodone-acetaminophen (NORCO/VICODIN) 5-325 MG per tablet Take 1-2 tablets by mouth every 4 (four) hours as needed for pain (left foot pain). pain  30 tablet  0  . hydrOXYzine (ATARAX/VISTARIL) 25 MG tablet Take 1 tablet (25 mg total) by mouth every 6 (six) hours.  12 tablet  0  . insulin aspart (NOVOLOG FLEXPEN) 100 unit/mL SOLN FlexPen Inject 10 Units into the skin 3 (three) times daily with meals. SSI. Plus 1 unit for every 30mg /dl above 100mg /dl      . insulin glargine (LANTUS) 100  UNIT/ML injection Inject 45 Units into the skin at bedtime.       Marland Kitchen LORazepam (ATIVAN) 0.5 MG tablet Take 0.5 mg by mouth 2 (two) times daily as needed for anxiety.      . metoprolol succinate (TOPROL-XL) 50 MG 24 hr tablet Take 50 mg by mouth daily after breakfast. Take with or immediately following a meal.      . Multiple Vitamin (DAILY VITE) TABS Take 1 tablet by mouth daily.      . mupirocin ointment (BACTROBAN) 2 % Apply 1 application topically 2 (two) times daily. Patient was to use this twice daily for 5 days.  Mar states patient finished this regimen on 04/30/2013      . omeprazole (PRILOSEC) 20 MG capsule Take 20 mg by mouth daily.      . ondansetron (ZOFRAN) 8 MG tablet Take 8 mg by mouth every 8 (eight) hours as needed for nausea.      Marland Kitchen oxyCODONE-acetaminophen (PERCOCET/ROXICET) 5-325 MG per tablet Take 2 tablets by mouth every 4 (four) hours as needed for pain.  15 tablet  0  . oxyCODONE-acetaminophen (PERCOCET/ROXICET) 5-325 MG per tablet Take 1 tablet by mouth every 6 (six) hours as needed for pain.  20 tablet  0  . Pancrelipase, Lip-Prot-Amyl, (CREON) 6000 UNITS CPEP Take 6,000-12,000 Units by mouth 3 (three) times daily. Patient takes 2 capsules three times daily with meals and 1 capsule three times daily with snacks      . polyethylene glycol (MIRALAX) powder Take 17 g by mouth daily. constipation        No Known Allergies    REVIEW OF SYSTEMS:  (Positives checked otherwise negative)  CARDIOVASCULAR:  []  chest pain, []  chest pressure, []  palpitations, []  shortness of breath when laying flat, []  shortness of breath with exertion,  [x]  pain in feet when walking, [x]  pain in feet when laying flat, []  history of blood clot in veins (DVT), []  history of phlebitis, [x]  swelling in legs, []  varicose veins  PULMONARY:  []  productive cough, [x]  asthma, []  wheezing  NEUROLOGIC:  []  weakness in arms or legs, []  numbness in arms or legs, []  difficulty speaking or slurred speech, []   temporary loss of vision in one eye, []   dizziness  HEMATOLOGIC:  []  bleeding problems, []  problems with blood clotting too easily  MUSCULOSKEL:  []  joint pain, []  joint swelling  GASTROINTEST:  []  vomiting blood, []  blood in stool     GENITOURINARY:  []  burning with urination, []  blood in urine  PSYCHIATRIC:  []  history of major depression  INTEGUMENTARY:  []  rashes, []  ulcers  CONSTITUTIONAL:  []  fever, []  chills  PRE-ADM LIVING: [x]  Home, []  Nursing home, []  Homeless  AMB STATUS: []  Walking, []  Walking w/ Assistance, [x]  Wheelchair secondary to pain, [] Bed ridden  For VQI Use RECENT HEART ATTACK (<6 mon): No  CAD Sx: []  No, [x]  Asx, h/o MI, []  Stable angina, []  Unstable angina  PRIOR CHF: [x]  No, []  Asx, []  Mild, []  Moderate, []  Severe  STRESS TEST: []  No, [x]  Normal, []  + ischemia, []  + MI, []  Both  Physical Examination  Filed Vitals:   05/09/13 1054 05/09/13 1100 05/09/13 1132  BP:  151/78   Pulse:  99   Temp:  97.8 F (36.6 C)   TempSrc:  Oral   Resp:  18   Height:   4' 9.09" (1.45 m)  Weight:  148 lb 2.4 oz (67.2 kg)   SpO2: 100% 100%     Body mass index is 31.96 kg/(m^2).  General: A&O x 3, acute distress Head: [place]/AT,  :Neck: Supple,  Pulmonary: Sym exp, good air movt, CTAB, no rales, rhonchi, & wheezing Cardiac: RRR, Nl S1, S2, no Murmurs, rubs or gallops Vascular:palpable femoral pulses.  Left foot non palpable pulses.   Gastrointestinal: soft, NTND, Musculoskeletal: M/S 5/5 throughout Extremities right LE.  Left foot with ischemic changes Neurologic:  Sensation intact Psychiatric: Judgment intact, Mood & affect appropriate for pt's clinical situation Dermatologic: See M/S exam for extremity exam, multiple scratchs marks on LE extremities Lymph : No Cervical, Axillary, or Inguinal lymphadenopathy    A/P: Elizabeht Suto Kirsten is a 57 y.o. female who presents with left ischemic foot changes s/p left second toe amputation. Plan BKA by Dr. Tawanna Cooler Early  05/10/2013   [facility]: [phone] [facility]   05/09/2013, 12:06 PM

## 2013-05-09 NOTE — Progress Notes (Signed)
Utilization Review Completed.[name] T6/02/2013  

## 2013-05-10 ENCOUNTER — Encounter (HOSPITAL_COMMUNITY): Payer: Self-pay | Admitting: Certified Registered"

## 2013-05-10 ENCOUNTER — Inpatient Hospital Stay: Admit: 2013-05-10 | Payer: Self-pay | Admitting: Vascular Surgery

## 2013-05-10 ENCOUNTER — Encounter (HOSPITAL_COMMUNITY): Payer: Self-pay | Admitting: Anesthesiology

## 2013-05-10 ENCOUNTER — Inpatient Hospital Stay (HOSPITAL_COMMUNITY): Payer: Medicaid Other | Admitting: Anesthesiology

## 2013-05-10 ENCOUNTER — Encounter (HOSPITAL_COMMUNITY)
Admission: AD | Disposition: A | Discharge status: Rehabilitation Facility | Payer: Self-pay | Source: Ambulatory Visit | Attending: Vascular Surgery

## 2013-05-10 DIAGNOSIS — I70269 Atherosclerosis of native arteries of extremities with gangrene, unspecified extremity: Secondary | ICD-10-CM

## 2013-05-10 HISTORY — PX: AMPUTATION: SHX166

## 2013-05-10 LAB — CBC
HCT: 30.3 % — ABNORMAL LOW (ref 36.0–46.0)
Platelets: 351 10*3/uL (ref 150–400)
RDW: 13.9 % (ref 11.5–15.5)
WBC: 12.7 10*3/uL — ABNORMAL HIGH (ref 4.0–10.5)

## 2013-05-10 LAB — GLUCOSE, CAPILLARY
Glucose-Capillary: 157 mg/dL — ABNORMAL HIGH (ref 70–99)
Glucose-Capillary: 175 mg/dL — ABNORMAL HIGH (ref 70–99)

## 2013-05-10 LAB — CREATININE, SERUM
Creatinine, Ser: 0.76 mg/dL (ref 0.50–1.10)
GFR calc Af Amer: >90 mL/min (ref >90)
GFR calc non Af Amer: >90 mL/min (ref >90)

## 2013-05-10 SURGERY — AMPUTATION BELOW KNEE
Anesthesia: General | Site: Leg Lower | Laterality: Left | Wound class: Clean

## 2013-05-10 MED ORDER — ACETAMINOPHEN 325 MG PO TABS
325.0000 mg | ORAL_TABLET | ORAL | Status: DC | PRN
  Administered 2013-05-15 – 2013-05-16 (×2): 650 mg via ORAL
  Filled 2013-05-10 (×2): qty 2

## 2013-05-10 MED ORDER — MIDAZOLAM HCL 5 MG/5ML IJ SOLN
INTRAMUSCULAR | Status: DC | PRN
  Administered 2013-05-10 (×2): 1 mg via INTRAVENOUS

## 2013-05-10 MED ORDER — ONDANSETRON HCL 4 MG/2ML IJ SOLN: 4.0000 mg | Freq: Once | INTRAMUSCULAR | Status: AC | PRN

## 2013-05-10 MED ORDER — FENTANYL CITRATE 0.05 MG/ML IJ SOLN
INTRAMUSCULAR | Status: DC | PRN
  Administered 2013-05-10 (×2): 100 ug via INTRAVENOUS
  Administered 2013-05-10: 50 ug via INTRAVENOUS

## 2013-05-10 MED ORDER — MUPIROCIN 2 % EX OINT
1.0000 "application " | TOPICAL_OINTMENT | Freq: Two times a day (BID) | CUTANEOUS | Status: DC
  Administered 2013-05-10 – 2013-05-17 (×14): 1 via TOPICAL
  Filled 2013-05-10: qty 22

## 2013-05-10 MED ORDER — OXYCODONE-ACETAMINOPHEN 5-325 MG PO TABS
1.0000 | ORAL_TABLET | ORAL | Status: DC | PRN
  Administered 2013-05-10 – 2013-05-11 (×3): 2 via ORAL
  Filled 2013-05-10 (×3): qty 2

## 2013-05-10 MED ORDER — OXYCODONE-ACETAMINOPHEN 5-325 MG PO TABS: 2.0000 | ORAL_TABLET | ORAL | Status: DC | PRN

## 2013-05-10 MED ORDER — ONDANSETRON HCL 4 MG/2ML IJ SOLN
INTRAMUSCULAR | Status: DC | PRN
  Administered 2013-05-10: 4 mg via INTRAVENOUS

## 2013-05-10 MED ORDER — FERROUS SULFATE 325 (65 FE) MG PO TABS
325.0000 mg | ORAL_TABLET | Freq: Every day | ORAL | Status: DC
  Administered 2013-05-11 – 2013-05-17 (×7): 325 mg via ORAL
  Filled 2013-05-10 (×8): qty 1

## 2013-05-10 MED ORDER — DEXTROSE 5 % IV SOLN
1.5000 g | Freq: Two times a day (BID) | INTRAVENOUS | Status: AC
  Administered 2013-05-10 – 2013-05-11 (×2): 1.5 g via INTRAVENOUS
  Filled 2013-05-10 (×2): qty 1.5

## 2013-05-10 MED ORDER — SODIUM CHLORIDE 0.9 % IV SOLN
INTRAVENOUS | Status: DC
  Administered 2013-05-10: 15:00:00 via INTRAVENOUS

## 2013-05-10 MED ORDER — ATORVASTATIN CALCIUM 10 MG PO TABS
10.0000 mg | ORAL_TABLET | Freq: Every day | ORAL | Status: DC
  Administered 2013-05-10 – 2013-05-17 (×8): 10 mg via ORAL
  Filled 2013-05-10 (×8): qty 1

## 2013-05-10 MED ORDER — EFAVIRENZ-EMTRICITAB-TENOFOVIR 600-200-300 MG PO TABS
1.0000 | ORAL_TABLET | Freq: Every day | ORAL | Status: DC
  Administered 2013-05-10 – 2013-05-16 (×7): 1 via ORAL
  Filled 2013-05-10 (×8): qty 1

## 2013-05-10 MED ORDER — HYDROMORPHONE HCL PF 1 MG/ML IJ SOLN
0.2500 mg | INTRAMUSCULAR | Status: DC | PRN
  Administered 2013-05-10 – 2013-05-11 (×2): 0.5 mg via INTRAVENOUS
  Administered 2013-05-11: 0.25 mg via INTRAVENOUS
  Filled 2013-05-10 (×3): qty 1

## 2013-05-10 MED ORDER — METOPROLOL SUCCINATE ER 50 MG PO TB24
50.0000 mg | ORAL_TABLET | Freq: Every day | ORAL | Status: DC
  Administered 2013-05-11 – 2013-05-17 (×7): 50 mg via ORAL
  Filled 2013-05-10 (×9): qty 1

## 2013-05-10 MED ORDER — LACTATED RINGERS IV SOLN
INTRAVENOUS | Status: DC | PRN
  Administered 2013-05-10 (×2): via INTRAVENOUS

## 2013-05-10 MED ORDER — POLYETHYLENE GLYCOL 3350 17 GM/SCOOP PO POWD: 17.0000 g | Freq: Every day | ORAL | Status: DC

## 2013-05-10 MED ORDER — LORAZEPAM 0.5 MG PO TABS
0.5000 mg | ORAL_TABLET | Freq: Two times a day (BID) | ORAL | Status: DC | PRN
  Administered 2013-05-16: 0.5 mg via ORAL
  Filled 2013-05-10: qty 1

## 2013-05-10 MED ORDER — PHENYLEPHRINE HCL 10 MG/ML IJ SOLN
INTRAMUSCULAR | Status: DC | PRN
  Administered 2013-05-10: 80 ug via INTRAVENOUS

## 2013-05-10 MED ORDER — PROPOFOL 10 MG/ML IV BOLUS
INTRAVENOUS | Status: DC | PRN
  Administered 2013-05-10: 160 mg via INTRAVENOUS
  Administered 2013-05-10: 30 mg via INTRAVENOUS

## 2013-05-10 MED ORDER — DOCUSATE SODIUM 100 MG PO CAPS
100.0000 mg | ORAL_CAPSULE | Freq: Every day | ORAL | Status: DC
  Administered 2013-05-11 – 2013-05-16 (×6): 100 mg via ORAL
  Filled 2013-05-10 (×7): qty 1

## 2013-05-10 MED ORDER — PANTOPRAZOLE SODIUM 40 MG PO TBEC
40.0000 mg | DELAYED_RELEASE_TABLET | Freq: Every day | ORAL | Status: DC
  Administered 2013-05-10 – 2013-05-17 (×8): 40 mg via ORAL
  Filled 2013-05-10 (×8): qty 1

## 2013-05-10 MED ORDER — HYDROMORPHONE HCL PF 1 MG/ML IJ SOLN
INTRAMUSCULAR | Status: DC | PRN
  Administered 2013-05-10 (×2): 0.5 mg via INTRAVENOUS

## 2013-05-10 MED ORDER — MORPHINE SULFATE 2 MG/ML IJ SOLN: 2.0000 mg | INTRAMUSCULAR | Status: DC | PRN

## 2013-05-10 MED ORDER — ONDANSETRON HCL 4 MG PO TABS
8.0000 mg | ORAL_TABLET | Freq: Three times a day (TID) | ORAL | Status: DC | PRN
  Administered 2013-05-16: 8 mg via ORAL
  Filled 2013-05-10: qty 2

## 2013-05-10 MED ORDER — ASPIRIN EC 81 MG PO TBEC
81.0000 mg | DELAYED_RELEASE_TABLET | Freq: Every day | ORAL | Status: DC
  Administered 2013-05-10 – 2013-05-17 (×8): 81 mg via ORAL
  Filled 2013-05-10 (×8): qty 1

## 2013-05-10 MED ORDER — CAMPHOR-MENTHOL 0.5-0.5 % EX LOTN: 1.0000 "application " | TOPICAL_LOTION | Freq: Three times a day (TID) | CUTANEOUS | Status: DC | PRN

## 2013-05-10 MED ORDER — HYDROXYZINE HCL 25 MG PO TABS
25.0000 mg | ORAL_TABLET | Freq: Three times a day (TID) | ORAL | Status: DC | PRN
  Filled 2013-05-10: qty 1

## 2013-05-10 MED ORDER — ACETAMINOPHEN 650 MG RE SUPP: 325.0000 mg | RECTAL | Status: DC | PRN

## 2013-05-10 MED ORDER — LORATADINE 10 MG PO TABS
10.0000 mg | ORAL_TABLET | Freq: Every day | ORAL | Status: DC
  Administered 2013-05-10 – 2013-05-17 (×8): 10 mg via ORAL
  Filled 2013-05-10 (×8): qty 1

## 2013-05-10 MED ORDER — LIDOCAINE HCL (CARDIAC) 20 MG/ML IV SOLN
INTRAVENOUS | Status: DC | PRN
  Administered 2013-05-10: 40 mg via INTRAVENOUS

## 2013-05-10 MED ORDER — 0.9 % SODIUM CHLORIDE (POUR BTL) OPTIME
TOPICAL | Status: DC | PRN
  Administered 2013-05-10: 1000 mL

## 2013-05-10 MED ORDER — ENOXAPARIN SODIUM 40 MG/0.4ML ~~LOC~~ SOLN
40.0000 mg | Freq: Every day | SUBCUTANEOUS | Status: DC
  Administered 2013-05-11 – 2013-05-17 (×7): 40 mg via SUBCUTANEOUS
  Filled 2013-05-10 (×7): qty 0.4

## 2013-05-10 SURGICAL SUPPLY — 44 items
BANDAGE ELASTIC 4 VELCRO ST LF (GAUZE/BANDAGES/DRESSINGS) ×2 IMPLANT
BANDAGE ELASTIC 6 VELCRO ST LF (GAUZE/BANDAGES/DRESSINGS) ×2 IMPLANT
BANDAGE ESMARK 6X9 LF (GAUZE/BANDAGES/DRESSINGS) IMPLANT
BANDAGE GAUZE ELAST BULKY 4 IN (GAUZE/BANDAGES/DRESSINGS) ×2 IMPLANT
BNDG ESMARK 6X9 LF (GAUZE/BANDAGES/DRESSINGS)
CANISTER SUCTION 2500CC (MISCELLANEOUS) ×2 IMPLANT
CLIP LIGATING EXTRA MED SLVR (CLIP) ×2 IMPLANT
CLIP LIGATING EXTRA SM BLUE (MISCELLANEOUS) ×2 IMPLANT
CLOTH BEACON ORANGE TIMEOUT ST (SAFETY) ×2 IMPLANT
COVER SURGICAL LIGHT HANDLE (MISCELLANEOUS) ×2 IMPLANT
CUFF TOURNIQUET SINGLE 34IN LL (TOURNIQUET CUFF) IMPLANT
CUFF TOURNIQUET SINGLE 44IN (TOURNIQUET CUFF) IMPLANT
DRAIN SNY 10X20 3/4 PERF (WOUND CARE) IMPLANT
DRAPE ORTHO SPLIT 77X108 STRL (DRAPES) ×2
DRAPE PROXIMA HALF (DRAPES) ×2 IMPLANT
DRAPE SURG ORHT 6 SPLT 77X108 (DRAPES) ×2 IMPLANT
ELECT REM PT RETURN 9FT ADLT (ELECTROSURGICAL) ×2
ELECTRODE REM PT RTRN 9FT ADLT (ELECTROSURGICAL) ×1 IMPLANT
EVACUATOR SILICONE 100CC (DRAIN) IMPLANT
GAUZE XEROFORM 5X9 LF (GAUZE/BANDAGES/DRESSINGS) ×2 IMPLANT
GLOVE BIO SURGEON STRL SZ 6.5 (GLOVE) ×2 IMPLANT
GLOVE BIOGEL PI IND STRL 6.5 (GLOVE) ×4 IMPLANT
GLOVE BIOGEL PI INDICATOR 6.5 (GLOVE) ×4
GLOVE SS BIOGEL STRL SZ 7.5 (GLOVE) ×1 IMPLANT
GLOVE SUPERSENSE BIOGEL SZ 7.5 (GLOVE) ×1
GOWN STRL NON-REIN LRG LVL3 (GOWN DISPOSABLE) ×6 IMPLANT
KIT BASIN OR (CUSTOM PROCEDURE TRAY) ×2 IMPLANT
KIT ROOM TURNOVER OR (KITS) ×2 IMPLANT
NS IRRIG 1000ML POUR BTL (IV SOLUTION) ×2 IMPLANT
PACK GENERAL/GYN (CUSTOM PROCEDURE TRAY) ×2 IMPLANT
PAD ARMBOARD 7.5X6 YLW CONV (MISCELLANEOUS) ×4 IMPLANT
PADDING CAST COTTON 6X4 STRL (CAST SUPPLIES) IMPLANT
SAW GIGLI STERILE 20 (MISCELLANEOUS) ×2 IMPLANT
SPONGE GAUZE 4X4 12PLY (GAUZE/BANDAGES/DRESSINGS) ×2 IMPLANT
STAPLER VISISTAT 35W (STAPLE) ×2 IMPLANT
STOCKINETTE IMPERVIOUS LG (DRAPES) ×2 IMPLANT
SUT ETHILON 3 0 PS 1 (SUTURE) IMPLANT
SUT VIC AB 0 CT1 18XCR BRD 8 (SUTURE) ×2 IMPLANT
SUT VIC AB 0 CT1 8-18 (SUTURE) ×2
SUT VICRYL AB 2 0 TIES (SUTURE) ×2 IMPLANT
TOWEL OR 17X24 6PK STRL BLUE (TOWEL DISPOSABLE) ×2 IMPLANT
TOWEL OR 17X26 10 PK STRL BLUE (TOWEL DISPOSABLE) ×2 IMPLANT
UNDERPAD 30X30 INCONTINENT (UNDERPADS AND DIAPERS) ×2 IMPLANT
WATER STERILE IRR 1000ML POUR (IV SOLUTION) ×2 IMPLANT

## 2013-05-10 NOTE — Transfer of Care (Signed)
Immediate Anesthesia Transfer of Care Note  Patient: [name]  Procedure(s) Performed: Procedure(s): AMPUTATION BELOW KNEE (Left)  Patient Location: PACU  Anesthesia Type:General  Level of Consciousness: awake, alert  and oriented  Airway & Oxygen Therapy: Patient Spontanous Breathing and Patient connected to face mask oxygen  Post-op Assessment: Report given to PACU RN and Post -op Vital signs reviewed and stable  Post vital signs: Reviewed and stable  Complications: No apparent anesthesia complications

## 2013-05-10 NOTE — H&P (View-Only) (Signed)
Patient is a for followup of left second toe amputation. She has known severe tibial vessel disease. She had angioplasty of popliteal artery. She has unfortunately had extensive gangrene of her foot with tissue loss extending up the dorsum of her foot. She is quite great deal of tenderness and erythema. She clearly does not have a viable foot. Discuss this with the patient explained she will require below-knee amputation. We had discussed this with the possible outcome with her known distal disease. She will be admitted to the hospital today for pain control IV antibiotics and left below-knee amputation tomorrow 

## 2013-05-10 NOTE — Anesthesia Postprocedure Evaluation (Signed)
  Anesthesia Post-op Note  Patient: [name]  Procedure(s) Performed: Procedure(s): AMPUTATION BELOW KNEE (Left)  Patient Location: PACU  Anesthesia Type:General  Level of Consciousness: awake, alert  and oriented  Airway and Oxygen Therapy: Patient Spontanous Breathing and Patient connected to nasal cannula oxygen  Post-op Pain: mild  Post-op Assessment: Post-op Vital signs reviewed and Patient's Cardiovascular Status Stable  Post-op Vital Signs: stable  Complications: No apparent anesthesia complications

## 2013-05-10 NOTE — Op Note (Signed)
OPERATIVE REPORT  DATE OF SURGERY: 05/10/2013  PATIENT: [name], 57 y.o. female MRN: [id number]  DOB: January 13, 1956  PRE-OPERATIVE DIAGNOSIS: Gangrene left foot  POST-OPERATIVE DIAGNOSIS:  Same  PROCEDURE: Left below-knee amputation  SURGEON:  Gretta Began, M.D.  PHYSICIAN ASSISTANT: Roczniak  ANESTHESIA:  Gen.  EBL: 100 ml  Total I/O In: 1000 [I.V.:1000] Out: 250 [Urine:250]  BLOOD ADMINISTERED: None  DRAINS: None  SPECIMEN: Below-knee amputation  COUNTS CORRECT:  YES  PLAN OF CARE: PACU   PATIENT DISPOSITION:  PACU - hemodynamically stable  PROCEDURE DETAILS: Patient was taken to the operating placed supine position where the area the left foot and leg with was prepped and draped in usual sterile fashion. Using a posterior-based gastrocnemius muscle flap an incision was made several fingerbreadths below the tibial prominence. This was carried through the anterior tibial muscle body with cautery in line with the skin incision. The posterior flap was occluded with the gastrocnemius in line with the skin incision. The soleus was transected in line with the anterior incision. This was with electrocautery. The antegrade and small saphenous vein were ligated with 0 Vicryl ties and divided. The popliteal artery and vein were also ligated with 0 Vicryl ties and divided. The belly periosteum was elevated on the fibula and this was divided with bone shears. The periosteum was elevated of the tibia this was divided with a Gigli saw. The bone edges were smoothed with a bone rasp. The wound irrigated with saline and hemostasis was obtained with electrocautery. The wounds were closed with 0 Vicryl figure-of-eight sutures to approximate the posterior flap up over the and the bones to the anterior flap. The skin was closed with skin clips. Sterile dressing was applied and the patient was taken to the recovery room in stable condition   Gretta Began, M.D. 05/10/2013 12:05 PM

## 2013-05-10 NOTE — Anesthesia Procedure Notes (Signed)
Procedure Name: LMA Insertion Date/Time: 05/10/2013 10:43 AM Performed by: Arlice Colt B Pre-anesthesia Checklist: Patient identified, Emergency Drugs available, Suction available, Patient being monitored and Timeout performed Patient Re-evaluated:Patient Re-evaluated prior to inductionOxygen Delivery Method: Circle system utilized Preoxygenation: Pre-oxygenation with 100% oxygen Intubation Type: IV induction LMA: LMA inserted LMA Size: 4.0 Number of attempts: 1 Placement Confirmation: positive ETCO2 and breath sounds checked- equal and bilateral Tube secured with: Tape Dental Injury: Teeth and Oropharynx as per pre-operative assessment

## 2013-05-10 NOTE — Anesthesia Preprocedure Evaluation (Addendum)
Anesthesia Evaluation  Patient identified by MRN, date of birth, ID band Patient awake and Patient confused    Reviewed: Allergy & Precautions, H&P , NPO status , Patient's Chart, lab work & pertinent test results  Airway Mallampati: II      Dental  (+) Teeth Intact and Dental Advisory Given   Pulmonary  breath sounds clear to auscultation        Cardiovascular Rhythm:Regular Rate:Normal     Neuro/Psych    GI/Hepatic   Endo/Other    Renal/GU      Musculoskeletal   Abdominal   Peds  Hematology   Anesthesia Other Findings   Reproductive/Obstetrics                           Anesthesia Physical Anesthesia Plan  ASA: III  Anesthesia Plan: General   Post-op Pain Management:    Induction: Intravenous  Airway Management Planned: LMA  Additional Equipment:   Intra-op Plan:   Post-operative Plan:   Informed Consent: I have reviewed the patients History and Physical, chart, labs and discussed the procedure including the risks, benefits and alternatives for the proposed anesthesia with the patient or authorized representative who has indicated his/her understanding and acceptance.   Dental advisory given  Plan Discussed with: CRNA, Anesthesiologist and Surgeon  Anesthesia Plan Comments: (PVD gangrene L. LE Type 2 DM glucose 128 Htn HIV (+) well controlled with meds Hep C GERD Depression)       Anesthesia Quick Evaluation

## 2013-05-10 NOTE — Interval H&P Note (Signed)
History and Physical Interval Note:  05/10/2013 10:20 AM  [name]  has presented today for surgery, with the diagnosis of PVD W/ GANGRENE  The various methods of treatment have been discussed with the patient and family. After consideration of risks, benefits and other options for treatment, the patient has consented to  Procedure(s): AMPUTATION BELOW KNEE (Left) as a surgical intervention .  The patient's history has been reviewed, patient examined, no change in status, stable for surgery.  I have reviewed the patient's chart and labs.  Questions were answered to the patient's satisfaction.     Pinchas Reither

## 2013-05-11 LAB — GLUCOSE, CAPILLARY
Glucose-Capillary: 154 mg/dL — ABNORMAL HIGH (ref 70–99)
Glucose-Capillary: 171 mg/dL — ABNORMAL HIGH (ref 70–99)
Glucose-Capillary: 150 mg/dL — ABNORMAL HIGH (ref 70–99)

## 2013-05-11 LAB — BASIC METABOLIC PANEL
BUN: 14 mg/dL (ref 6–23)
CO2: 17 mEq/L — ABNORMAL LOW (ref 19–32)
Chloride: 105 mEq/L (ref 96–112)
Glucose, Bld: 152 mg/dL — ABNORMAL HIGH (ref 70–99)
Potassium: 3.7 mEq/L (ref 3.5–5.1)

## 2013-05-11 LAB — CBC
HCT: 30.1 % — ABNORMAL LOW (ref 36.0–46.0)
Hemoglobin: 9.9 g/dL — ABNORMAL LOW (ref 12.0–15.0)
MCH: 29.8 pg (ref 26.0–34.0)
MCHC: 32.9 g/dL (ref 30.0–36.0)
MCV: 90.7 fL (ref 78.0–100.0)
RBC: 3.32 MIL/uL — ABNORMAL LOW (ref 3.87–5.11)

## 2013-05-11 MED ORDER — HYDROMORPHONE HCL PF 1 MG/ML IJ SOLN
1.0000 mg | INTRAMUSCULAR | Status: DC | PRN
Start: 2013-05-11 — End: 2013-05-17
  Administered 2013-05-11 – 2013-05-15 (×17): 1 mg via INTRAVENOUS
  Filled 2013-05-11 (×17): qty 1

## 2013-05-11 MED ORDER — OXYCODONE HCL 5 MG PO TABS
10.0000 mg | ORAL_TABLET | ORAL | Status: DC | PRN
Start: 2013-05-11 — End: 2013-05-17
  Administered 2013-05-12 – 2013-05-17 (×18): 10 mg via ORAL
  Filled 2013-05-11 (×3): qty 2
  Filled 2013-05-11: qty 1
  Filled 2013-05-11 (×6): qty 2
  Filled 2013-05-11: qty 1
  Filled 2013-05-11 (×10): qty 2

## 2013-05-11 MED ORDER — KETOROLAC TROMETHAMINE 30 MG/ML IJ SOLN
15.0000 mg | Freq: Three times a day (TID) | INTRAMUSCULAR | Status: AC | PRN
  Filled 2013-05-11: qty 1

## 2013-05-11 NOTE — Evaluation (Signed)
Physical Therapy Evaluation Patient Details Name: [name] MRN: [id number] DOB: Jan 26, 1956 Today's Date: 05/11/2013 Time: 8295-6213 PT Time Calculation (min): 28 min  PT Assessment / Plan / Recommendation Clinical Impression  Pt is a 57 y.o. female s/p L BKA. Patient demonstrates deficits in functional mobility as indicated below. PLOF patient was independent.  Feel patient will benefit from continue skilled PT to address deficits and maximze function.  Rec CIR consult for continued inpatient rehabiliation.  Feel patient will benefit greatly from CIR for transfer training, w/c management in addition to care and positioning instrauction for residual limp with hope for future prosthesis.  Will continue to see as indicated.    PT Assessment  Patient needs continued PT services    Follow Up Recommendations  CIR    Does the patient have the potential to tolerate intense rehabilitation      Barriers to Discharge        Equipment Recommendations  None recommended by PT    Recommendations for Other Services Rehab consult   Frequency Min 3X/week    Precautions / Restrictions Precautions Precautions: None Precaution Comments: LBKA (spoke with patient at length regarding positioning) Restrictions Weight Bearing Restrictions: No   Pertinent Vitals/Pain 1/10 after pain meds      Mobility  Bed Mobility Bed Mobility: Supine to Sit;Sitting - Scoot to Edge of Bed Supine to Sit: 4: Min assist Sitting - Scoot to [place] of Bed: 4: Min guard Details for Bed Mobility Assistance: VCs for sequencing, able to perform without assist. Pt requiried increased time to perform Transfers Transfers: Sit to Stand;Stand to Sit;Stand Pivot Transfers Sit to Stand: 1: +2 Total assist;With upper extremity assist;From chair/3-in-1;From bed Sit to Stand: Patient Percentage: 60% Stand to Sit: 1: +2 Total assist;With upper extremity assist;To toilet;To chair/3-in-1 Stand to Sit: Patient Percentage:  60% Stand Pivot Transfers: 1: +2 Total assist Stand Pivot Transfers: Patient Percentage: 60% Details for Transfer Assistance: Used RW. VCs for hand placement and body position, assist for stability with rw Ambulation/Gait Ambulation/Gait Assistance: Not tested (comment)    Exercises General Exercises - Lower Extremity Quad Sets: AROM;Left;5 reps (instructed to perform for positioning/contracture prevention) Other Exercises Other Exercises: Educated pt on sensory exercises for stimulation and sensation of residula limb.  Provided patient with dry cloth and instructed in soft touch.  Educated patient on importance of positioning for contracture prevention. Advised pillow position for extension.   PT Diagnosis: Difficulty walking;Acute pain  PT Problem List: Decreased strength;Decreased range of motion;Decreased activity tolerance;Decreased balance;Decreased mobility;Decreased knowledge of use of DME;Pain PT Treatment Interventions: DME instruction;Gait training;Stair training;Functional mobility training;Therapeutic activities;Therapeutic exercise;Balance training;Patient/family education   PT Goals Acute Rehab PT Goals PT Goal Formulation: With patient Time For Goal Achievement: 05/25/13 Potential to Achieve Goals: Good Pt will go Sit to Stand: with modified independence PT Goal: Sit to Stand - Progress: Goal set today Pt will Transfer Bed to Chair/Chair to Bed: with modified independence PT Transfer Goal: Bed to Chair/Chair to Bed - Progress: Goal set today Pt will Ambulate: 1 - 15 feet;with min assist PT Goal: Ambulate - Progress: Goal set today Pt will Perform Home Exercise Program: Independently PT Goal: Perform Home Exercise Program - Progress: Goal set today  Visit Information  Last PT Received On: 05/11/13 Assistance Needed: +2 PT/OT Co-Evaluation/Treatment: Yes    Subjective Data  Subjective: Pt agreeable to therapy evaluation Patient Stated Goal: to go home   Prior  Functioning  Home Living Available Help at Discharge: Other (Comment) (unsure  if pt lives at SNF or ALF) Type of Home: Assisted living [employer] Toilet: Standard Home Adaptive Equipment: Wheelchair - Careers adviser (comment) (borrowed) Prior Function Level of Independence: Independent Comments: Pt enjoys watching TV, knitting and crocheting Communication Communication: No difficulties Dominant Hand: Right    Cognition  Cognition Arousal/Alertness: Awake/alert Overall Cognitive Status: Within Functional Limits for tasks assessed    Extremity/Trunk Assessment Right Upper Extremity Assessment RUE ROM/Strength/Tone: Within functional levels Left Upper Extremity Assessment LUE ROM/Strength/Tone: Within functional levels Right Lower Extremity Assessment RLE ROM/Strength/Tone: WFL for tasks assessed Left Lower Extremity Assessment LLE ROM/Strength/Tone: Deficits;Unable to fully assess;Due to pain LLE ROM/Strength/Tone Deficits: LBKA   Balance Balance Balance Assessed: Yes Static Standing Balance Static Standing - Balance Support: Bilateral upper extremity supported Static Standing - Level of Assistance: 1: +2 Total assist Static Standing - Comment/# of Minutes: Patient performed well with bilateral support  End of Session PT - End of Session Equipment Utilized During Treatment: Gait belt Activity Tolerance: Patient limited by pain Patient left: in chair;with call bell/phone within reach;Other (comment) (With OT for remainder of session) Nurse Communication: Mobility status  GP     Fabio Asa 05/11/2013, 2:40 PM Charlotte Crumb, PT DPT  [phone]

## 2013-05-11 NOTE — Evaluation (Signed)
Occupational Therapy Evaluation Patient Details Name: [name] MRN: [id number] DOB: Mar 17, 1956 Today's Date: 05/11/2013 Time: 1100-1141 OT Time Calculation (min): 41 min  OT Assessment / Plan / Recommendation Clinical Impression  Pt presents to OT s/p LBKA. Pt with decreased I with ADL activity and will benefit from skilled OT to increase I with ADL activity and return to PLOF,  Pt reports she lives in a SNF- but may be an ALF.  Pt will need ST SNF for rehab.    OT Assessment  Patient needs continued OT Services    Follow Up Recommendations  SNF             Frequency  Min 2X/week    Precautions / Restrictions Precautions Precautions: None Precaution Comments: LBKA Restrictions Weight Bearing Restrictions: No       ADL  Grooming: Performed;Set up Where Assessed - Grooming: Supported sitting Toilet Transfer: Teacher, early years/pre: Patient Percentage: 40% Statistician Method: Ambulance person: Materials engineer and Hygiene: Performed;+2 Total assistance Toileting - Architect and Hygiene: Patient Percentage: 40% Where Assessed - Engineer, mining and Hygiene: Standing Transfers/Ambulation Related to ADLs: Educated pt on role of OT.     OT Diagnosis:    OT Problem List: Decreased strength;Pain;Decreased activity tolerance;Impaired balance (sitting and/or standing);Decreased knowledge of precautions OT Treatment Interventions: Self-care/ADL training;DME and/or AE instruction;Patient/family education   OT Goals Acute Rehab OT Goals OT Goal Formulation: With patient Time For Goal Achievement: 05/25/13 Potential to Achieve Goals: Good ADL Goals Pt Will Perform Grooming: with supervision;Standing at sink ADL Goal: Grooming - Progress: Goal set today Pt Will Perform Upper Body Bathing: Independently;Sitting at sink ADL Goal: Upper Body Bathing - Progress: Goal set  today Pt Will Perform Lower Body Bathing: with supervision;Sit to stand from chair ADL Goal: Lower Body Bathing - Progress: Goal set today Pt Will Perform Upper Body Dressing: Independently;Sitting, chair ADL Goal: Upper Body Dressing - Progress: Goal set today Pt Will Perform Lower Body Dressing: with supervision;Sit to stand from chair ADL Goal: Lower Body Dressing - Progress: Goal set today Pt Will Transfer to Toilet: with supervision;Comfort height toilet ADL Goal: Toilet Transfer - Progress: Goal set today Pt Will Perform Toileting - Clothing Manipulation: with supervision;Sitting on 3-in-1 or toilet ADL Goal: Toileting - Clothing Manipulation - Progress: Goal set today Pt Will Perform Toileting - Hygiene: with supervision;Leaning right and/or left on 3-in-1/toilet;Sit to stand from 3-in-1/toilet ADL Goal: Toileting - Hygiene - Progress: Goal set today  Visit Information  Last OT Received On: 05/11/13 Assistance Needed: +2    Subjective Data  Subjective: I dont know if i want to do this   Prior Functioning     Home Living Available Help at Discharge: Other (Comment) (unsure if pt lives at SNF or ALF) Type of Home: Assisted living [employer] Toilet: Standard Home Adaptive Equipment: Wheelchair - Careers adviser (comment) (borrowed) Prior Function Level of Independence: Independent Comments: Pt enjoys watching TV, knitting and crocheting Communication Communication: No difficulties Dominant Hand: Right         Vision/Perception Vision - History Patient Visual Report: No change from baseline   Cognition  Cognition Arousal/Alertness: Awake/alert Overall Cognitive Status: Within Functional Limits for tasks assessed    Extremity/Trunk Assessment Right Upper Extremity Assessment RUE ROM/Strength/Tone: Within functional levels Left Upper Extremity Assessment LUE ROM/Strength/Tone: Within functional levels     Mobility Bed Mobility Bed Mobility: Supine to Sit Supine  to Sit: 4: Min assist  Transfers Transfers: Sit to Stand;Stand to Sit Sit to Stand: 1: +2 Total assist;With upper extremity assist;From chair/3-in-1;From bed Sit to Stand: Patient Percentage: 60% Stand to Sit: 1: +2 Total assist;With upper extremity assist;To toilet;To chair/3-in-1 Stand to Sit: Patient Percentage: 60%           End of Session OT - End of Session Equipment Utilized During Treatment: Gait belt Patient left: in chair;with call bell/phone within reach;with nursing in room Nurse Communication: Mobility status  GO     Alba Cory 05/11/2013, 12:25 PM

## 2013-05-11 NOTE — Progress Notes (Signed)
Physical medicine and rehabilitation consult requested. 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 Patient resides at skilled nursing facility for the past 1-1/2 years and plans to return to skilled facility. Will hold on formal rehabilitation consult at this time discussed with case management on plan to return to skilled Center

## 2013-05-11 NOTE — Progress Notes (Signed)
Spoke with Pharmacist regarding pain control. With pt multiple medical issues will try toradol 15mg  IV q 8 hours prn, change Dilaudid to 1mg   q1 hr PRN. They recommended we change percocet to oxycodone.   Will also recheck BMET in am.

## 2013-05-11 NOTE — Progress Notes (Signed)
Subjective: Interval History: none.. having difficulty with pain control.  Objective: Vital signs in last 24 hours: Temp:  [97.2 F (36.2 C)-99.5 F (37.5 C)] 98.4 F (36.9 C) (06/04 2124) Pulse Rate:  [83-92] 83 (06/04 2124) Resp:  [11-22] 18 (06/04 2124) BP: (130-156)/(55-85) 136/55 mmHg (06/04 2124) SpO2:  [95 %-100 %] 98 % (06/04 2124)  Intake/Output from previous day: 06/04 0701 - 06/05 0700 In: 1100 [I.V.:1100] Out: 850 [Urine:850] Intake/Output this shift:    Hemodynamically stable. Left below knee dressing intact  Lab Results:  Recent Labs  05/10/13 1450 05/11/13 0420  WBC 12.7* 12.8*  HGB 10.0* 9.9*  HCT 30.3* 30.1*  PLT 351 361   BMET  Recent Labs  05/09/13 1210 05/10/13 1450 05/11/13 0420  NA 134*  --  134*  K 3.2*  --  3.7  CL 102  --  105  CO2 17*  --  17*  GLUCOSE 174*  --  152*  BUN 16  --  14  CREATININE 0.81 0.76 0.68  CALCIUM 9.2  --  9.2    Studies/Results: Mr Foot Left W Wo Contrast  04/14/2013   *RADIOLOGY REPORT*  Clinical Data: Ischemic foot.  The ulcer on the left second toe.  MRI OF THE LEFT FOREFOOT WITHOUT AND WITH CONTRAST  Technique:  Multiplanar, multisequence MR imaging was performed both before and after administration of intravenous contrast.  Contrast: 15mL MULTIHANCE GADOBENATE DIMEGLUMINE 529 MG/ML IV SOLN  Comparison: Radiographs dated 02/06/2013  Findings: There is abnormal edema and enhancement of the soft tissues of the tips of the first and second toes of the left foot. It is also abnormal edema and enhancement of the tip of the tuft of the distal phalangeal bone of the great toe and of the entire distal phalangeal bone of the second toe.  There is no abscess.  No joint infection or tenosynovitis.  No bone destruction.  IMPRESSION:  1.  Cellulitis of the distal aspects of the first and second toes of the left foot.  Soft tissue ulcerations on both toes.  2. The edema and enhancement of the tuft of the distal phalangeal bone  of the great toe and of the entire distal phalangeal bone of the second toe could be reactive secondary to the adjacent cellulitis.  There is no bone destruction.  However, the patient is certainly at risk for developing osteomyelitis in those bones and [name] infection could give this appearance.   Original Report Authenticated By: Francene Boyers, M.D.   Anti-infectives: Anti-infectives   Start     Dose/Rate Route Frequency Ordered Stop   05/10/13 2200  efavirenz-emtricitabine-tenofovir (ATRIPLA) 600-200-300 MG per tablet 1 tablet     1 tablet Oral Daily at bedtime 05/10/13 1507     05/10/13 1800  cefUROXime (ZINACEF) 1.5 g in dextrose 5 % 50 mL IVPB     1.5 g 100 mL/hr over 30 Minutes Intravenous Every 12 hours 05/10/13 1320 05/11/13 1759   05/10/13 0600  [MAR Hold]  cefUROXime (ZINACEF) 1.5 g in dextrose 5 % 50 mL IVPB     (On MAR Hold since 05/10/13 0950)   1.5 g 100 mL/hr over 30 Minutes Intravenous On call to O.R. 05/09/13 1255 05/10/13 1043   05/09/13 2200  efavirenz-emtricitabine-tenofovir (ATRIPLA) 600-200-300 MG per tablet 1 tablet  Status:  Discontinued     1 tablet Oral Daily at bedtime 05/09/13 1053 05/10/13 1316      Assessment/Plan: s/p Procedure(s): AMPUTATION BELOW KNEE (Left) Stable overall acute postoperative  blood loss anemia is stable at 10 and 30. No increased pain medication for better control. Will check amputation stump tomorrow.   LOS: 2 days   [name] 05/11/2013, 6:31 AM

## 2013-05-11 NOTE — Care Management Note (Signed)
    Page 1 of 1   05/17/2013     4:13:20 PM   CARE MANAGEMENT NOTE 05/17/2013  Patient:  [name], [name]   Account Number:  [id number]  Date Initiated:  05/11/2013  Documentation initiated by:  Debbi Strandberg  Subjective/Objective Assessment:   PT S/P LT BKA ON 05/10/13; PTA,PT RESIDES AT [facility].     Action/Plan:   CSW CONSULTED TO FACILITATE LIKLEY DC TO SNF AT DC.   Anticipated DC Date:  05/17/2013   Anticipated DC Plan:  SKILLED NURSING FACILITY  In-house referral  Clinical Social Worker      DC Planning Services  CM consult      Choice offered to / List presented to:             Status of service:  Completed, signed off Medicare Important Message given?   (If response is "NO", the following Medicare IM given date fields will be blank) Date Medicare IM given:   Date Additional Medicare IM given:    Discharge Disposition:  SKILLED NURSING FACILITY  Per UR Regulation:  Reviewed for med. necessity/level of care/duration of stay  If discussed at Long Length of Stay Meetings, dates discussed:    Comments:  05/17/13 Sender Rueb,RN,BSN [phone] PT DISCHARGING TO SNF TODAY, PER CSW ARRANGEMENTS.  05/15/13 Kayn Haymore, RN,BSN [phone] CIR CONSULT IN PROGRESS.  CSW CONT TO FOLLOW FOR SNF BACKUP SHOULD CIR DECLINE ADMISSION.

## 2013-05-11 NOTE — Progress Notes (Signed)
Pt complains of 10/10 pain after 0.5 mg of Dilaudid given. Dr. Arbie Cookey aware, new orders for PRN medications to be placed. Will continue to monitor.

## 2013-05-11 NOTE — Progress Notes (Signed)
Rehab Admissions Coordinator Note:  Patient was screened by Clois Dupes for appropriateness for an Inpatient Acute Rehab Consult.  At this time, we are recommending Skilled Nursing Facility for she resides in SNF for the past 1 1/2 [id number]  Clois Dupes 05/11/2013, 3:57 PM  I can be reached at [phone].

## 2013-05-12 ENCOUNTER — Encounter (HOSPITAL_COMMUNITY): Payer: Self-pay | Admitting: Vascular Surgery

## 2013-05-12 DIAGNOSIS — I739 Peripheral vascular disease, unspecified: Secondary | ICD-10-CM

## 2013-05-12 DIAGNOSIS — L98499 Non-pressure chronic ulcer of skin of other sites with unspecified severity: Secondary | ICD-10-CM

## 2013-05-12 DIAGNOSIS — S88119A Complete traumatic amputation at level between knee and ankle, unspecified lower leg, initial encounter: Secondary | ICD-10-CM

## 2013-05-12 LAB — GLUCOSE, CAPILLARY
Glucose-Capillary: 243 mg/dL — ABNORMAL HIGH (ref 70–99)
Glucose-Capillary: 277 mg/dL — ABNORMAL HIGH (ref 70–99)

## 2013-05-12 LAB — CBC
HCT: 31.1 % — ABNORMAL LOW (ref 36.0–46.0)
MCHC: 33.4 g/dL (ref 30.0–36.0)
MCV: 90.7 fL (ref 78.0–100.0)
Platelets: 369 10*3/uL (ref 150–400)
RDW: 13.8 % (ref 11.5–15.5)
WBC: 11.2 10*3/uL — ABNORMAL HIGH (ref 4.0–10.5)

## 2013-05-12 LAB — BASIC METABOLIC PANEL
BUN: 12 mg/dL (ref 6–23)
Calcium: 8.9 mg/dL (ref 8.4–10.5)
Creatinine, Ser: 0.66 mg/dL (ref 0.50–1.10)
GFR calc Af Amer: >90 mL/min (ref >90)

## 2013-05-12 NOTE — Clinical Social Work Placement (Signed)
Clinical Social Work Department CLINICAL SOCIAL WORK PLACEMENT NOTE 05/12/2013  Patient:  [name], [name]  Account Number:  [id number] Admit date:  04/23/2006  Clinical Social Worker:  Read Drivers  Date/time:  05/12/2013 11:43 AM  Clinical Social Work is seeking post-discharge placement for this patient at the following level of care:   SKILLED NURSING   (*CSW will update this form in Epic as items are completed)   05/12/2013  Patient/family provided with [facility] Department of Clinical Social Work's list of facilities offering this level of care within the geographic area requested by the patient (or if unable, by the patient's family).  05/12/2013  Patient/family informed of their freedom to choose among providers that offer the needed level of care, that participate in Medicare, Medicaid or managed care program needed by the patient, have an available bed and are willing to accept the patient.  05/12/2013  Patient/family informed of [facility]' ownership interest in [facility], as well as of the fact that they are under no obligation to receive care at this facility.  PASARR submitted to EDS on 05/11/2013 PASARR number received from EDS on   FL2 transmitted to all facilities in geographic area requested by pt/family on  05/12/2013 FL2 transmitted to all facilities within larger geographic area on 05/12/2013  Patient informed that his/her managed care company has contracts with or will negotiate with  certain facilities, including the following:     Patient/family informed of bed offers received:   Patient chooses bed at  Physician recommends and patient chooses bed at    Patient to be transferred to  on   Patient to be transferred to facility by   The following physician request were entered in Epic:   Additional Comments:

## 2013-05-12 NOTE — Consult Note (Signed)
Physical Medicine and Rehabilitation Consult Reason for Consult: Dr. Arbie Cookey Referring Physician: Left below-knee amputation   HPI: [name] is a 57 y.o. right-handed female with history of HIV, polysubstance abuse, hepatitis C and chronic pain. Patient is a resident of [facility] assisted living facility. Admitted 05/10/2013 with recent left second toe amputation and poor healing with gangrenous changes. No relief with conservative care and underwent left below-knee amputation 05/10/2013 per Dr. Arbie Cookey. Postoperative pain management. Placed on subcutaneous Lovenox for DVT prophylaxis. Physical and occupational therapy evaluations completed 05/11/2013 with recommendations for physical medicine rehabilitation consult to consider inpatient rehabilitation services.   Review of Systems  Gastrointestinal: Positive for constipation.       GERD  Musculoskeletal: Positive for myalgias and joint pain.  Neurological: Positive for tingling and weakness.  Psychiatric/Behavioral: Positive for depression. The patient has insomnia.   All other systems reviewed and are negative.   Past Medical History  Diagnosis Date  . Depression   . Neuropathy   . Mood disorder   . TIA (transient ischemic attack)   . Pancreatitis chronic   . Chronic pain   . HIV (human immunodeficiency virus infection) 1990's    takes Norpramin nightly  . Polysubstance abuse   . Cardiomyopathy, nonischemic     EF 45%  . Coronary artery disease   . Peripheral vascular disease   . Asthma     "used to when I was young" (02/20/2013)  . Arthritis     "legs and arms" (02/20/2013)  . Hypercholesterolemia     takes Lipitor nightly  . Seasonal allergies     takes Zyrtec daily  . Anemia     takes Iron pill daily  . Nausea     takes Zofran prn  . GERD (gastroesophageal reflux disease)     takes Prilosec daily  . Insomnia     takes Trazodone nightly  . Constipation     takes miralax prn and stool softener daily  . Type II  diabetes mellitus     lantus 45units at bedtime  . Dysrhythmia     takes Metoprolol daily  . Weakness of both legs   . Peripheral neuropathy   . Chronic back pain   . Hepatitis C     C  . Urinary frequency   . Nocturia    Past Surgical History  Procedure Laterality Date  . Carotid endarterectomy Right   . Tubal ligation    . Lower extremity angiogram    . Toe amputation    . Amputation Left 04/28/2013    Procedure: AMPUTATION DIGIT;  Surgeon: Larina Earthly, MD;  Location: [facility] OR;  Service: Vascular;  Laterality: Left;  left 2nd toe amputation   Family History  Problem Relation Age of Onset  . Coronary artery disease Father 61   Social History:  reports that she quit smoking about 2 months ago. Her smoking use included Cigarettes. She has a 10 pack-year smoking history. She has never used smokeless tobacco. She reports that she does not drink alcohol or use illicit drugs. Allergies: No Known Allergies Medications Prior to Admission  Medication Sig Dispense Refill  . aspirin EC 81 MG tablet Take 81 mg by mouth daily.      Marland Kitchen atorvastatin (LIPITOR) 10 MG tablet Take 10 mg by mouth at bedtime.      . cetirizine (ZYRTEC) 10 MG tablet Take 10 mg by mouth daily.      Marland Kitchen docusate sodium (COLACE) 100 MG capsule Take  100 mg by mouth daily.       Marland Kitchen efavirenz-emtricitabine-tenofovir (ATRIPLA) 600-200-300 MG per tablet Take 1 tablet by mouth at bedtime.       . ferrous sulfate 325 (65 FE) MG tablet Take 325 mg by mouth daily with breakfast.      . HYDROcodone-acetaminophen (NORCO/VICODIN) 5-325 MG per tablet Take 1-2 tablets by mouth every 4 (four) hours as needed for pain (left foot pain). pain  30 tablet  0  . hydrOXYzine (ATARAX/VISTARIL) 25 MG tablet Take 1 tablet (25 mg total) by mouth every 6 (six) hours.  12 tablet  0  . insulin aspart (NOVOLOG FLEXPEN) 100 unit/mL SOLN FlexPen Inject 10 Units into the skin 3 (three) times daily with meals. SSI. Plus 1 unit for every 30mg /dl above 100mg /dl       . insulin glargine (LANTUS) 100 UNIT/ML injection Inject 45 Units into the skin at bedtime.       Marland Kitchen LORazepam (ATIVAN) 0.5 MG tablet Take 0.5 mg by mouth 2 (two) times daily as needed for anxiety.      . metoprolol succinate (TOPROL-XL) 50 MG 24 hr tablet Take 50 mg by mouth daily after breakfast. Take with or immediately following a meal.      . Multiple Vitamin (DAILY VITE) TABS Take 1 tablet by mouth daily.      . mupirocin ointment (BACTROBAN) 2 % Apply 1 application topically 2 (two) times daily. Patient was to use this twice daily for 5 days.  Mar states patient finished this regimen on 04/30/2013      . omeprazole (PRILOSEC) 20 MG capsule Take 20 mg by mouth daily.      . ondansetron (ZOFRAN) 8 MG tablet Take 8 mg by mouth every 8 (eight) hours as needed for nausea.      Marland Kitchen oxyCODONE-acetaminophen (PERCOCET/ROXICET) 5-325 MG per tablet Take 2 tablets by mouth every 4 (four) hours as needed for pain.  15 tablet  0  . oxyCODONE-acetaminophen (PERCOCET/ROXICET) 5-325 MG per tablet Take 1 tablet by mouth every 6 (six) hours as needed for pain.  20 tablet  0  . Pancrelipase, Lip-Prot-Amyl, (CREON) 6000 UNITS CPEP Take 6,000-12,000 Units by mouth 3 (three) times daily. Patient takes 2 capsules three times daily with meals and 1 capsule three times daily with snacks      . polyethylene glycol (MIRALAX) powder Take 17 g by mouth daily. constipation        Home: Home Living Available Help at Discharge: Other (Comment) (unsure if pt lives at SNF or ALF) Type of Home: Assisted living [employer] Toilet: Standard Home Adaptive Equipment: Wheelchair - Careers adviser (comment) (borrowed)  Functional History: Prior Function Comments: Pt enjoys watching TV, knitting and crocheting Functional Status:  Mobility: Bed Mobility Bed Mobility: Supine to Sit;Sitting - Scoot to Edge of Bed Supine to Sit: 4: Min assist Sitting - Scoot to [place] of Bed: 4: Min guard Transfers Transfers: Sit to Stand;Stand to  [employer] Transfers Sit to Stand: 1: +2 Total assist;With upper extremity assist;From chair/3-in-1;From bed Sit to Stand: Patient Percentage: 60% Stand to Sit: 1: +2 Total assist;With upper extremity assist;To toilet;To chair/3-in-1 Stand to Sit: Patient Percentage: 60% Stand Pivot Transfers: 1: +2 Total assist Stand Pivot Transfers: Patient Percentage: 60% Ambulation/Gait Ambulation/Gait Assistance: Not tested (comment)    ADL: ADL Grooming: Performed;Set up Where Assessed - Grooming: Supported sitting Toilet Transfer: Performed;+2 Total assistance Toilet Transfer Method: Ambulance person: Bedside commode Transfers/Ambulation Related to ADLs: Educated pt  on role of OT.   Cognition: Cognition Overall Cognitive Status: Within Functional Limits for tasks assessed Arousal/Alertness: Awake/alert Orientation Level: Oriented X4 Cognition Arousal/Alertness: Awake/alert Overall Cognitive Status: Within Functional Limits for tasks assessed  Blood pressure 110/60, pulse 88, temperature 98.9 F (37.2 C), temperature source Oral, resp. rate 18, height 4' 9.09" (1.45 m), weight 67.5 kg (148 lb 13 oz), SpO2 98.00%. Physical Exam  Vitals reviewed. HENT:  Head: Normocephalic.  Eyes: EOM are normal.  Neck: Neck supple. No thyromegaly present.  Cardiovascular: Normal rate and regular rhythm.   Pulmonary/Chest: Breath sounds normal. No respiratory distress.  Abdominal: Soft. Bowel sounds are normal. She exhibits no distension.  Musculoskeletal:  Left BKA site is tender/ wrapped w/ ace and dressing.  Neurological: She is alert.  Patient is oriented to person place and date of birth. Flat affect but cooperative with exam. Can lift right leg off bed with assist of hands. RLE is 4/5 with distal sensory changes. UE's are grossly 4+.   Skin:  Amputation surgical site is dressed. Chronic vascular changes in distal right leg. Leg is warm.   Psychiatric: She has a normal  mood and affect. Her behavior is normal. Judgment and thought content normal.  Mood dampened by pain    Results for orders placed during the hospital encounter of 05/09/13 (from the past 24 hour(s))  GLUCOSE, CAPILLARY     Status: Abnormal   Collection Time    05/11/13 11:28 AM      Result Value Range   Glucose-Capillary 171 (*) 70 - 99 mg/dL   Comment 1 Notify RN    GLUCOSE, CAPILLARY     Status: Abnormal   Collection Time    05/11/13  4:24 PM      Result Value Range   Glucose-Capillary 177 (*) 70 - 99 mg/dL   Comment 1 Documented in Chart     Comment 2 Notify RN    GLUCOSE, CAPILLARY     Status: Abnormal   Collection Time    05/11/13  9:02 PM      Result Value Range   Glucose-Capillary 150 (*) 70 - 99 mg/dL   Comment 1 Documented in Chart     Comment 2 Notify RN    BASIC METABOLIC PANEL     Status: Abnormal   Collection Time    05/12/13  5:15 AM      Result Value Range   Sodium 131 (*) 135 - 145 mEq/L   Potassium 3.7  3.5 - 5.1 mEq/L   Chloride 99  96 - 112 mEq/L   CO2 18 (*) 19 - 32 mEq/L   Glucose, Bld 208 (*) 70 - 99 mg/dL   BUN 12  6 - 23 mg/dL   Creatinine, Ser 1.61  0.50 - 1.10 mg/dL   Calcium 8.9  8.4 - 09.6 mg/dL   GFR calc non Af Amer >90  >90 mL/min   GFR calc Af Amer >90  >90 mL/min  CBC     Status: Abnormal   Collection Time    05/12/13  5:15 AM      Result Value Range   WBC 11.2 (*) 4.0 - 10.5 K/uL   RBC 3.43 (*) 3.87 - 5.11 MIL/uL   Hemoglobin 10.4 (*) 12.0 - 15.0 g/dL   HCT 04.5 (*) 40.9 - 81.1 %   MCV 90.7  78.0 - 100.0 fL   MCH 30.3  26.0 - 34.0 pg   MCHC 33.4  30.0 - 36.0 g/dL  RDW 13.8  11.5 - 15.5 %   Platelets 369  150 - 400 K/uL  GLUCOSE, CAPILLARY     Status: Abnormal   Collection Time    05/12/13  5:34 AM      Result Value Range   Glucose-Capillary 186 (*) 70 - 99 mg/dL   No results found.  Assessment/Plan: Diagnosis: left BKA 1. Does the need for close, 24 hr/day medical supervision in concert with the patient's rehab needs  make it unreasonable for this patient to be served in a less intensive setting? Yes 2. Co-Morbidities requiring supervision/potential complications: dm, depression, hiv, etoh abuse, drug abuse 3. Due to bladder management, bowel management, safety, skin/wound care, disease management, medication administration, pain management and patient education, does the patient require 24 hr/day rehab nursing? Yes 4. Does the patient require coordinated care of a physician, rehab nurse, PT (1-2 hrs/day, 5 days/week) and OT (1-2 hrs/day, 5 days/week) to address physical and functional deficits in the context of the above medical diagnosis(es)? Yes Addressing deficits in the following areas: balance, endurance, locomotion, strength, transferring, bowel/bladder control, bathing, dressing, feeding, grooming, toileting and psychosocial support 5. Can the patient actively participate in an intensive therapy program of at least 3 hrs of therapy per day at least 5 days per week? Yes 6. The potential for patient to make measurable gains while on inpatient rehab is excellent 7. Anticipated functional outcomes upon discharge from inpatient rehab are mod I to supervision with PT, mod I to supervision with OT, n/a with SLP. 8. Estimated rehab length of stay to reach the above functional goals is: 10-14 days 9. Does the patient have adequate social supports to accommodate these discharge functional goals? Potentially 10. Anticipated D/C setting: Home 11. Anticipated post D/C treatments: HH therapy 12. Overall Rehab/Functional Prognosis: good  RECOMMENDATIONS: This patient's condition is appropriate for continued rehabilitative care in the following setting: CIR Patient has agreed to participate in recommended program. Yes Note that insurance prior authorization may be required for reimbursement for recommended care.  Comment: Need to see what additional assistance her ALF can provide. May need more help than just AM  bathing initially. Rehab RN to follow up.   Ranelle Oyster, MD, [place] [facility]     05/12/2013

## 2013-05-12 NOTE — Clinical Social Work Note (Addendum)
1:53PM - CSW notified by [facility] re: CIR - "no beds available until Monday".  CSW is awaiting a PASARR before pt can be d/c to SNF.  CSW will continue to follow.  11:15am - CSW reviewed pt chart and discussed with RNCM and yesterday's covering CSW.  CSW requests CIR to reconsider pt admission.  Pt is from  [facility] ALF in [place], not SNF as CIR once thought.  CSW received notification from CIR that Riley Kill will see pt today.  Vickii Penna, LCSWA [phone]  Clinical Social Work

## 2013-05-12 NOTE — Progress Notes (Addendum)
[facility] OF [place]  Postoperative Visit - Amputation  Date of Surgery: 05/09/2013 - 05/10/2013 Procedure(s): AMPUTATION BELOW KNEE Left Surgeon: Surgeon(s): Larina Earthly, MD POD: 2 Days Post-Op  Subjective [name] is a 57 y.o. female who is S/P Left Procedure(s): AMPUTATION BELOW KNEE.  Pt.reports increased pain in the stump. The patient notes pain is well controlled. Pt. denies phantom pain.  Significant Diagnostic Studies: CBC Lab Results  Component Value Date   WBC 11.2* 05/12/2013   HGB 10.4* 05/12/2013   HCT 31.1* 05/12/2013   MCV 90.7 05/12/2013   PLT 369 05/12/2013    BMET    Component Value Date/Time   NA 131* 05/12/2013 0515   K 3.7 05/12/2013 0515   CL 99 05/12/2013 0515   CO2 18* 05/12/2013 0515   GLUCOSE 208* 05/12/2013 0515   BUN 12 05/12/2013 0515   CREATININE 0.66 05/12/2013 0515   CREATININE 1.30* 02/13/2013 1017   CALCIUM 8.9 05/12/2013 0515   GFRNONAA >90 05/12/2013 0515   GFRAA >90 05/12/2013 0515    COAG Lab Results  Component Value Date   INR 0.93 05/09/2013   INR 0.96 02/20/2013   INR 0.9 05/22/2008   No results found for this basename: PTT     Intake/Output Summary (Last 24 hours) at 05/12/13 0815 Last data filed at 05/12/13 0546  Gross per 24 hour  Intake      0 ml  Output   1100 ml  Net  -1100 ml   No data found.    Physical Examination  BP Readings from Last 3 Encounters:  05/12/13 149/82  05/12/13 149/82  05/09/13 87/59   Temp Readings from Last 3 Encounters:  05/12/13 98.9 F (37.2 C) Oral  05/12/13 98.9 F (37.2 C) Oral  05/09/13 98.1 F (36.7 C) Oral   SpO2 Readings from Last 3 Encounters:  05/12/13 98%  05/12/13 98%  05/09/13 100%   Pulse Readings from Last 3 Encounters:  05/12/13 89  05/12/13 89  05/09/13 101    Pt is A&Ox3  WDWN female with no complaints  Left amputation wound is healing well.  There is good bone coverage in the stump Stump is warm and well perfused, without drainage; without  erythema   Assessment/plan:  [name] is a 57 y.o. female who is s/p Left Procedure(s): AMPUTATION BELOW KNEE  The patient's stump is clean, dry, intact or viable.  Follow-up 4 weeks from surgery  Pending SNF placement  Thomasena Edis EMMA MAUREEN 8:15 AM 05/12/2013

## 2013-05-12 NOTE — Clinical Social Work Psychosocial (Signed)
Clinical Social Work Department BRIEF PSYCHOSOCIAL ASSESSMENT 05/12/2013  Patient:  [name], [name]     Account Number:  [id number]     Admit date:  04/23/2006  Clinical Social Worker:  Read Drivers  Date/Time:  05/12/2013 11:34 AM  Referred by:  Physician  Date Referred:  05/12/2013 Referred for  SNF Placement   Other Referral:   none   Interview type:  Patient Other interview type:   none    PSYCHOSOCIAL DATA Living Status:  ALONE Admitted from facility:  [facility] Level of care:  Assisted Living Primary support name:  Sheran Lawless [phone] Primary support relationship to patient:  SIBLING Degree of support available:   good    CURRENT CONCERNS Current Concerns  Post-Acute Placement   Other Concerns:    SOCIAL WORK ASSESSMENT / PLAN CSW assessed pt.  Pt was alert and oriented x4.  Pt reports being from [facility]F in [place] [facility]  CSW introduced self and CSW role.  CSW explained that pt was to be re-assessed by CIR today (per Britta Mccreedy, CIR) for possible admission.  CSW explained to pt that we would need to have a back up plan in case CIR is unable to meet pt needs.  Pt acknowledges understanding.  Pt is agreeable to CSW conducting bed search for both [place] and [place].  Covering CSW applied for PASARR yesterday.  PASARR is currently under review.  PASARR is requiring a 30-day note.  This is placed on the chart and CSW will request MD to sign. Pt was unable to offer 1st and 2nd SNF choice.  CSW will present bed offers when available if CIR unable to meet pt needs.   Assessment/plan status:  Information/Referral to [facility] Other assessment/ plan:   none   Information/referral to community resources:   SNF  CIR    PATIENT'S/FAMILY'S RESPONSE TO PLAN OF CARE: Pt was appreciative of CSW time and assistance.  Pt acknowledged understanding and was agreeable to SNF as backup plan, but remains hopeful re: CIR  admission.       Vickii Penna, LCSWA [phone]  Clinical Social Work

## 2013-05-12 NOTE — Progress Notes (Signed)
Noted pt from ALF , not SNF. Dr. Riley Kill to see pt today. [phone]

## 2013-05-13 LAB — GLUCOSE, CAPILLARY
Glucose-Capillary: 283 mg/dL — ABNORMAL HIGH (ref 70–99)
Glucose-Capillary: 272 mg/dL — ABNORMAL HIGH (ref 70–99)
Glucose-Capillary: 203 mg/dL — ABNORMAL HIGH (ref 70–99)

## 2013-05-13 NOTE — Progress Notes (Signed)
Orthopedic Tech Progress Note Patient Details:  [name] Sep 25, 1956 [id number] [employer] called for brace order Patient ID: [name] [name], female   DOB: 03-10-1956, 57 y.o.   MRN: [id number]   [name] 05/13/2013, 10:13 AM

## 2013-05-13 NOTE — Progress Notes (Signed)
Clinical Social Work  [employer] and 30 day note placed on chart for MD signature. CSW faxed information to [facility] and [facility] for alternative placement if CIR is unable to accept. CSW will follow up with bed offers.  Unk Lightning, LCSW  (Weekend Coverage)

## 2013-05-13 NOTE — Progress Notes (Addendum)
[facility]IN [facility] OF [place]  Postoperative Visit - Amputation  Date of Surgery: 05/09/2013 - 05/10/2013 Procedure(s): AMPUTATION BELOW KNEE Left Surgeon: Surgeon(s): Larina Earthly, MD POD: 3 Days Post-Op  Subjective [name] is a 57 y.o. female who is S/P Left Procedure(s): AMPUTATION BELOW KNEE.  Pt.denies increased pain in the stump. The patient notes pain is well controlled. Pt. denies phantom pain.  Significant Diagnostic Studies: CBC Lab Results  Component Value Date   WBC 11.2* 05/12/2013   HGB 10.4* 05/12/2013   HCT 31.1* 05/12/2013   MCV 90.7 05/12/2013   PLT 369 05/12/2013    BMET    Component Value Date/Time   NA 131* 05/12/2013 0515   K 3.7 05/12/2013 0515   CL 99 05/12/2013 0515   CO2 18* 05/12/2013 0515   GLUCOSE 208* 05/12/2013 0515   BUN 12 05/12/2013 0515   CREATININE 0.66 05/12/2013 0515   CREATININE 1.30* 02/13/2013 1017   CALCIUM 8.9 05/12/2013 0515   GFRNONAA >90 05/12/2013 0515   GFRAA >90 05/12/2013 0515    COAG Lab Results  Component Value Date   INR 0.93 05/09/2013   INR 0.96 02/20/2013   INR 0.9 05/22/2008   No results found for this basename: PTT     Intake/Output Summary (Last 24 hours) at 05/13/13 0848 Last data filed at 05/13/13 0430  Gross per 24 hour  Intake    480 ml  Output   1600 ml  Net  -1120 ml   Patient Vitals for the past 24 hrs:  Urine Occurrence  05/12/13 2208 1     Physical Examination  BP Readings from Last 3 Encounters:  05/13/13 135/69  05/13/13 135/69  05/09/13 87/59   Temp Readings from Last 3 Encounters:  05/13/13 98.4 F (36.9 C) Oral  05/13/13 98.4 F (36.9 C) Oral  05/09/13 98.1 F (36.7 C) Oral   SpO2 Readings from Last 3 Encounters:  05/13/13 98%  05/13/13 98%  05/09/13 100%   Pulse Readings from Last 3 Encounters:  05/13/13 83  05/13/13 83  05/09/13 101    Pt is A&Ox3  WDWN female with no complaints  Left amputation wound is healing well.  There is good bone coverage in the stump Stump is  warm and well perfused, without drainage; without erythema   Assessment/plan:  [name] is a 57 y.o. female who is s/p Left Procedure(s): AMPUTATION BELOW KNEE  The patient's stump is clean, dry, intact or viable.  Ordered limb guard from [employer]  Follow-up 4 weeks from surgery  Thomasena Edis EMMA MAUREEN 8:48 AM 05/13/2013    Some bloody drainage from stump, skin edges viable Still with some pain 5-10 degree contracture D/c to SNF when pain controlled] Encourage to work on extending limb to prevent contracture No pillow under leg as this encourages flexion  Fabienne Bruns, MD [facility] [place] Office: [phone] Pager: [phone]

## 2013-05-13 NOTE — Progress Notes (Signed)
PT Cancellation Note  Patient Details Name: [name] MRN: [id number] DOB: [birth year]   Cancelled Treatment:    Reason Eval/Treat Not Completed: Pain limiting ability to participate.  "They put that thing on my leg today and it hurts"  Patient declined PT today due to pain.  Will return at later time for PT.   Vena Austria 05/13/2013, 3:04 PM Durenda Hurt. Renaldo Fiddler, [facility] Acute Rehab Services Pager [phone]

## 2013-05-14 LAB — GLUCOSE, CAPILLARY
Glucose-Capillary: 260 mg/dL — ABNORMAL HIGH (ref 70–99)
Glucose-Capillary: 248 mg/dL — ABNORMAL HIGH (ref 70–99)

## 2013-05-14 NOTE — Progress Notes (Addendum)
VASC[facility] VEIN SPECIALISTS OF [place]  Postoperative Visit - Amputation  Date of Surgery: 05/09/2013 - 05/10/2013 Procedure(s): AMPUTATION BELOW KNEE Left Surgeon: Surgeon(s): Larina Earthly, MD POD: 4 Days Post-Op  Subjective [name] is a 57 y.o. female who is S/P Left Procedure(s): AMPUTATION BELOW KNEE.  Pt.denies increased pain in the stump. The patient notes pain is well controlled. Pt. denies phantom pain.  Significant Diagnostic Studies: CBC Lab Results  Component Value Date   WBC 11.2* 05/12/2013   HGB 10.4* 05/12/2013   HCT 31.1* 05/12/2013   MCV 90.7 05/12/2013   PLT 369 05/12/2013    BMET    Component Value Date/Time   NA 131* 05/12/2013 0515   K 3.7 05/12/2013 0515   CL 99 05/12/2013 0515   CO2 18* 05/12/2013 0515   GLUCOSE 208* 05/12/2013 0515   BUN 12 05/12/2013 0515   CREATININE 0.66 05/12/2013 0515   CREATININE 1.30* 02/13/2013 1017   CALCIUM 8.9 05/12/2013 0515   GFRNONAA >90 05/12/2013 0515   GFRAA >90 05/12/2013 0515    COAG Lab Results  Component Value Date   INR 0.93 05/09/2013   INR 0.96 02/20/2013   INR 0.9 05/22/2008   No results found for this basename: PTT    No intake or output data in the 24 hours ending 05/14/13 0745 Patient Vitals for the past 24 hrs:  Urine Occurrence  05/14/13 0300 1     Physical Examination  BP Readings from Last 3 Encounters:  05/14/13 121/69  05/14/13 121/69  05/09/13 87/59   Temp Readings from Last 3 Encounters:  05/14/13 98.3 F (36.8 C) Oral  05/14/13 98.3 F (36.8 C) Oral  05/09/13 98.1 F (36.7 C) Oral   SpO2 Readings from Last 3 Encounters:  05/14/13 98%  05/14/13 98%  05/09/13 100%   Pulse Readings from Last 3 Encounters:  05/14/13 87  05/14/13 87  05/09/13 101    Pt is A&Ox3  WDWN female with no complaints  Left amputation wound is healing well.  There is good bone coverage in the stump Stump is warm and well perfused, without drainage; without erythema   Assessment/plan:  [name] is a  57 y.o. female who is s/p Left Procedure(s): AMPUTATION BELOW KNEE  The patient's stump is clean, dry, intact or viable.  Follow-up 4 weeks from surgery  Patient has stump protector in room  Clinton Gallant [facility] 7:45 AM 05/14/2013   Awaiting SNF Change dressing once daily   Fabienne Bruns, MD [facility] [place] Office: [phone] Pager: [phone]

## 2013-05-15 LAB — GLUCOSE, CAPILLARY: Glucose-Capillary: 264 mg/dL — ABNORMAL HIGH (ref 70–99)

## 2013-05-15 MED ORDER — OXYCODONE HCL 10 MG PO TABS: 10.0000 mg | ORAL_TABLET | ORAL | Status: DC | PRN

## 2013-05-15 NOTE — Progress Notes (Signed)
Physical Therapy Treatment Patient Details Name: [name] MRN: [id number] DOB: [birth year] Today's Date: 05/15/2013 Time: 2130-8657 PT Time Calculation (min): 13 min  PT Assessment / Plan / Recommendation Comments on Treatment Session  Pt demonstrates some improvements in overall mobility but continues to self limit activity tolerance at this time.  Concerned for patients increasingly flattened affect.  Pt may benefit from psych consult for coping strategies secondary to life altering medical condition (BKA).  Will continue to see acutely as indicated.    Follow Up Recommendations  CIR     Does the patient have the potential to tolerate intense rehabilitation     Barriers to Discharge        Equipment Recommendations  None recommended by PT    Recommendations for Other Services Rehab consult;Other (comment) (psych consult if appropriate)  Frequency Min 3X/week   Plan Discharge plan remains appropriate    Precautions / Restrictions Precautions Precautions: None Precaution Comments: LBKA (spoke with patient at length regarding positioning)   Pertinent Vitals/Pain 6/10    Mobility  Bed Mobility Bed Mobility: Supine to Sit;Sitting - Scoot to Edge of Bed Supine to Sit: 4: Min guard Sitting - Scoot to [place] of Bed: 4: Min guard Details for Bed Mobility Assistance: VCs for sequencing, able to perform without assist. Pt requiried increased time to perform Transfers Transfers: Sit to Stand;Stand to [employer] Transfers Sit to Stand: 4: Min assist Stand to Sit: 4: Min assist Stand Pivot Transfers: 4: Min guard Details for Transfer Assistance: VCs for handplacement with no compliance; VCs for upright posture and safety Ambulation/Gait Ambulation/Gait Assistance: Not tested (comment)      PT Goals Acute Rehab PT Goals PT Goal Formulation: With patient Time For Goal Achievement: 05/25/13 Potential to Achieve Goals: Good Pt will go Sit to Stand: with modified  independence PT Goal: Sit to Stand - Progress: Progressing toward goal Pt will Transfer Bed to Chair/Chair to Bed: with modified independence PT Transfer Goal: Bed to Chair/Chair to Bed - Progress: Progressing toward goal Pt will Ambulate: 1 - 15 feet;with min assist PT Goal: Ambulate - Progress: Not progressing Pt will Perform Home Exercise Program: Independently  Visit Information  Last PT Received On: 05/15/13 Assistance Needed: +1 (+2 for ambulation)    Subjective Data  Subjective: Pt agreeable for OOB Patient Stated Goal: to go home   Cognition  Cognition Arousal/Alertness: Awake/alert Behavior During Therapy: Flat affect Overall Cognitive Status: Within Functional Limits for tasks assessed    Balance  Static Standing Balance Static Standing - Balance Support: Right upper extremity supported Static Standing - Level of Assistance: 5: Stand by assistance Static Standing - Comment/# of Minutes: able to maintain standing for approximately 2 minutes to faciliate dependent position tolerance  End of Session PT - End of Session Equipment Utilized During Treatment: Gait belt Activity Tolerance: Patient limited by pain (demonstrates some self limiting behaviors) Patient left: in chair;with call bell/phone within reach;Other (comment) (With OT for remainder of session) Nurse Communication: Mobility status   GP     Fabio Asa 05/15/2013, 12:52 PM Charlotte Crumb, PT DPT  [phone]

## 2013-05-15 NOTE — Progress Notes (Signed)
Roderic Palau will be the [employer] Coordinator to follow up on this pt.

## 2013-05-15 NOTE — Discharge Summary (Addendum)
[facility]in Specialists Discharge Summary   Patient ID:  [name] MRN: [id number] DOB/AGE: 06-26-1956 57 y.o.  Admit date: 05/09/2013 Discharge date: 05/17/2013 Date of Surgery: 05/09/2013 - 05/10/2013 Surgeon: Moishe Spice): Larina Earthly, MD  Admission Diagnosis: peripheral vascular disease with non healing wound,painf   PVD W/ GANGRENE  Discharge Diagnoses:  peripheral vascular disease with non healing wound,painf   PVD W/ GANGRENE  Secondary Diagnoses: Past Medical History  Diagnosis Date  . Depression   . Neuropathy   . Mood disorder   . TIA (transient ischemic attack)   . Pancreatitis chronic   . Chronic pain   . HIV (human immunodeficiency virus infection) 1990's    takes Norpramin nightly  . Polysubstance abuse   . Cardiomyopathy, nonischemic     EF 45%  . Coronary artery disease   . Peripheral vascular disease   . Asthma     "used to when I was young" (02/20/2013)  . Arthritis     "legs and arms" (02/20/2013)  . Hypercholesterolemia     takes Lipitor nightly  . Seasonal allergies     takes Zyrtec daily  . Anemia     takes Iron pill daily  . Nausea     takes Zofran prn  . GERD (gastroesophageal reflux disease)     takes Prilosec daily  . Insomnia     takes Trazodone nightly  . Constipation     takes miralax prn and stool softener daily  . Type II diabetes mellitus     lantus 45units at bedtime  . Dysrhythmia     takes Metoprolol daily  . Weakness of both legs   . Peripheral neuropathy   . Chronic back pain   . Hepatitis C     C  . Urinary frequency   . Nocturia     Procedure(s): AMPUTATION BELOW KNEE  Discharged Condition: good  HPI: Patient is a for followup of left second toe amputation. She has known severe tibial vessel disease. She had angioplasty of popliteal artery. She has unfortunately had extensive gangrene of her foot with tissue loss extending up the dorsum of her foot. She is quite great deal of tenderness and erythema. She  clearly does not have a viable foot. Discuss this with the patient explained she will require below-knee amputation. We had discussed this with the possible outcome with her known distal disease. She will be admitted to the hospital today for pain control IV antibiotics and left below-knee amputation tomorrow  05/09/2013 left BKA.  Stump is healing well and she has increased her appetite.  Pain control was a factor, but now is controlled on PO medication.  She will be discharged today to a SNF.   Hospital Course:  [name] is a 57 y.o. female is S/P Left Procedure(s): AMPUTATION BELOW KNEE Extubated: POD # 0 Physical exam: Left BKA incision clean and dry, no active drainage or erythema.  Post-op wounds clean, dry, intact or healing well Pt. Ambulating, voiding and taking PO diet without difficulty. Pt pain controlled with PO pain meds. Labs as below Complications:none  Consults:     Significant Diagnostic Studies: CBC Lab Results  Component Value Date   WBC 11.2* 05/12/2013   HGB 10.4* 05/12/2013   HCT 31.1* 05/12/2013   MCV 90.7 05/12/2013   PLT 369 05/12/2013    BMET    Component Value Date/Time   NA 131* 05/12/2013 0515   K 3.7 05/12/2013 0515   CL 99 05/12/2013  0515   CO2 18* 05/12/2013 0515   GLUCOSE 208* 05/12/2013 0515   BUN 12 05/12/2013 0515   CREATININE 0.66 05/12/2013 0515   CREATININE 1.30* 02/13/2013 1017   CALCIUM 8.9 05/12/2013 0515   GFRNONAA >90 05/12/2013 0515   GFRAA >90 05/12/2013 0515   COAG Lab Results  Component Value Date   INR 0.93 05/09/2013   INR 0.96 02/20/2013   INR 0.9 05/22/2008     Disposition:  Discharge to :Skilled nursing facility Discharge Orders   Future Appointments Provider Department Dept Phone   06/22/2013 10:00 AM [facility] [phone]   07/06/2013 10:00 AM Randall Hiss, MD [facility] [phone]   Future Orders Complete By Expires     Call MD  for:  redness, tenderness, or signs of infection (pain, swelling, bleeding, redness, odor or green/yellow discharge around incision site)  As directed     Call MD for:  severe or increased pain, loss or decreased feeling  in affected limb(s)  As directed     Call MD for:  temperature >100.5  As directed     Discharge instructions  As directed     Comments:      Stump protector at all times except bathing and therapy.    Increase activity slowly  As directed     Comments:      Walk with assistance use walker or cane as needed    May shower   As directed     Scheduling Instructions:      May shower with soap and water once no drainage on the left stump.    Resume previous diet  As directed         Medication List    TAKE these medications       aspirin EC 81 MG tablet  Take 81 mg by mouth daily.     atorvastatin 10 MG tablet  Commonly known as:  LIPITOR  Take 10 mg by mouth at bedtime.     ATRIPLA 600-200-300 MG per tablet  Generic drug:  efavirenz-emtricitabine-tenofovir  Take 1 tablet by mouth at bedtime.     cetirizine 10 MG tablet  Commonly known as:  ZYRTEC  Take 10 mg by mouth daily.     CREON 6000 UNITS Cpep  Generic drug:  Pancrelipase (Lip-Prot-Amyl)  Take 6,000-12,000 Units by mouth 3 (three) times daily. Patient takes 2 capsules three times daily with meals and 1 capsule three times daily with snacks     Daily Vite Tabs  Take 1 tablet by mouth daily.     docusate sodium 100 MG capsule  Commonly known as:  COLACE  Take 100 mg by mouth daily.     ferrous sulfate 325 (65 FE) MG tablet  Take 325 mg by mouth daily with breakfast.     HYDROcodone-acetaminophen 5-325 MG per tablet  Commonly known as:  NORCO/VICODIN  Take 1-2 tablets by mouth every 4 (four) hours as needed for pain (left foot pain). pain     hydrOXYzine 25 MG tablet  Commonly known as:  ATARAX/VISTARIL  Take 1 tablet (25 mg total) by mouth every 6 (six) hours.     insulin glargine 100 UNIT/ML  injection  Commonly known as:  LANTUS  Inject 45 Units into the skin at bedtime.     LORazepam 0.5 MG tablet  Commonly known as:  ATIVAN  Take 0.5 mg by mouth 2 (two)  times daily as needed for anxiety.     metoprolol succinate 50 MG 24 hr tablet  Commonly known as:  TOPROL-XL  Take 50 mg by mouth daily after breakfast. Take with or immediately following a meal.     MIRALAX powder  Generic drug:  polyethylene glycol powder  Take 17 g by mouth daily. constipation     mupirocin ointment 2 %  Commonly known as:  BACTROBAN  Apply 1 application topically 2 (two) times daily. Patient was to use this twice daily for 5 days.  Mar states patient finished this regimen on 04/30/2013     NOVOLOG FLEXPEN 100 unit/mL Soln FlexPen  Generic drug:  insulin aspart  Inject 10 Units into the skin 3 (three) times daily with meals. SSI. Plus 1 unit for every 30mg /dl above 100mg /dl     omeprazole 20 MG capsule  Commonly known as:  PRILOSEC  Take 20 mg by mouth daily.     ondansetron 8 MG tablet  Commonly known as:  ZOFRAN  Take 8 mg by mouth every 8 (eight) hours as needed for nausea.     Oxycodone HCl 10 MG Tabs  Take 1 tablet (10 mg total) by mouth every 4 (four) hours as needed.     oxyCODONE-acetaminophen 5-325 MG per tablet  Commonly known as:  PERCOCET/ROXICET  Take 2 tablets by mouth every 4 (four) hours as needed for pain.     oxyCODONE-acetaminophen 5-325 MG per tablet  Commonly known as:  PERCOCET/ROXICET  Take 1 tablet by mouth every 6 (six) hours as needed for pain.       Verbal and written Discharge instructions given to the patient. Wound care per Discharge AVS   Signed: Clinton Gallant [facility] 05/15/2013, 8:49 AM  Discharge summary reviewed with Rosalio Loud via phone on 05/17/13 and This RN given verbal order to sign off discharge summary.

## 2013-05-15 NOTE — Progress Notes (Signed)
Inpatient Diabetes Program Recommendations  AACE/ADA: New Consensus Statement on Inpatient Glycemic Control (2013)  Target Ranges:  Prepandial:   less than 140 mg/dL      Peak postprandial:   less than 180 mg/dL (1-2 hours)      Critically ill patients:  140 - 180 mg/dL  Results for KEVIA, ZAUCHA (MRN [id number]) as of 05/15/2013 09:26  Ref. Range 05/14/2013 06:14 05/14/2013 11:06 05/14/2013 16:10 05/14/2013 21:28 05/15/2013 05:58  Glucose-Capillary Latest Range: 70-99 mg/dL 782 (H) 956 (H) 213 (H) 327 (H) 332 (H)    Please add Lantus 20 units for continued hyperglycemia.  Patient takes Lantus 45 units at home. Thank you  Piedad Climes BSN, RN,CDE Inpatient Diabetes Coordinator [phone] (team pager)

## 2013-05-15 NOTE — Progress Notes (Addendum)
[facility] [place]  Postoperative Visit - Amputation  Date of Surgery: 05/09/2013 - 05/10/2013 Procedure(s): AMPUTATION BELOW KNEE Left Surgeon: Surgeon(s): Larina Earthly, MD POD: 5 Days Post-Op  Subjective [name] is a 57 y.o. female who is S/P Left Procedure(s): AMPUTATION BELOW KNEE.  Pt.denies increased pain in the stump. The patient notes pain is well controlled. Pt. denies phantom pain.  Significant Diagnostic Studies: CBC Lab Results  Component Value Date   WBC 11.2* 05/12/2013   HGB 10.4* 05/12/2013   HCT 31.1* 05/12/2013   MCV 90.7 05/12/2013   PLT 369 05/12/2013    BMET    Component Value Date/Time   NA 131* 05/12/2013 0515   K 3.7 05/12/2013 0515   CL 99 05/12/2013 0515   CO2 18* 05/12/2013 0515   GLUCOSE 208* 05/12/2013 0515   BUN 12 05/12/2013 0515   CREATININE 0.66 05/12/2013 0515   CREATININE 1.30* 02/13/2013 1017   CALCIUM 8.9 05/12/2013 0515   GFRNONAA >90 05/12/2013 0515   GFRAA >90 05/12/2013 0515    COAG Lab Results  Component Value Date   INR 0.93 05/09/2013   INR 0.96 02/20/2013   INR 0.9 05/22/2008   No results found for this basename: PTT    No intake or output data in the 24 hours ending 05/15/13 0840 Patient Vitals for the past 24 hrs:  Stool Color  05/14/13 1400 Brown;Black     Physical Examination  BP Readings from Last 3 Encounters:  05/15/13 126/75  05/15/13 126/75  05/09/13 87/59   Temp Readings from Last 3 Encounters:  05/15/13 99.1 F (37.3 C) Oral  05/15/13 99.1 F (37.3 C) Oral  05/09/13 98.1 F (36.7 C) Oral   SpO2 Readings from Last 3 Encounters:  05/15/13 99%  05/15/13 99%  05/09/13 100%   Pulse Readings from Last 3 Encounters:  05/15/13 86  05/15/13 86  05/09/13 101    Pt is A&Ox3  WDWN female with no complaints  Left amputation wound is healing well.  There is good bone coverage in the stump Stump is warm and well perfused, with min. Bloody drainage drainage; without  erythema   Assessment/plan:  [name] is a 57 y.o. female who is s/p Left Procedure(s): AMPUTATION BELOW KNEE  The patient's stump is viable.  Follow-up 4 weeks from surgery  SNF pending  Mosetta Pigeon 8:40 AM 05/15/2013   Awaiting SNF FL2 signed  Fabienne Bruns, MD [facility] [place] Office: [phone] Pager: [phone]

## 2013-05-15 NOTE — Progress Notes (Signed)
Emotional support and education given to Pt this morning regarding importance of motivation and avoiding depression secondary to sedentary tendencies.  PT able to get into recliner for lunch, but discussed with RN afterward evident depression, and indication for psych eval for possible anti-depressant therapy.  PA called and agreed to discuss with MD.  Will con't plan of care.

## 2013-05-16 LAB — GLUCOSE, CAPILLARY
Glucose-Capillary: 165 mg/dL — ABNORMAL HIGH (ref 70–99)
Glucose-Capillary: 331 mg/dL — ABNORMAL HIGH (ref 70–99)

## 2013-05-16 NOTE — Progress Notes (Signed)
Rehab admissions - Evaluated for possible admission.  Patient is open to coming to inpatient rehab.  However, niece does not feel that patient should come to rehab and then discharge directly to ALF.  Niece prefers that patient go to a SNF for her therapies.  She does not feel that the ALF will give patient adequate care after a short inpatient rehab stay.  I have informed the Child psychotherapist.  Call me for questions.  #[phone]

## 2013-05-16 NOTE — Progress Notes (Signed)
Clinical Social Worker Data processing manager message with Annice Pih, pt's niece.  CSW attempted to phone pt's sisters, no voicemails set up for either sister.    Angelia Mould, MSW, [place] [phone]

## 2013-05-16 NOTE — Progress Notes (Addendum)
[facility] [place]  Postoperative Visit - Amputation  Date of Surgery: 05/09/2013 - 05/10/2013 Procedure(s): AMPUTATION BELOW KNEE Left Surgeon: Surgeon(s): Larina Earthly, MD POD: 6 Days Post-Op  Subjective [name] is a 57 y.o. female who is S/P Left Procedure(s): AMPUTATION BELOW KNEE.  Pt.denies increased pain in the stump. The patient notes pain is well controlled. Pt. denies phantom pain.  Significant Diagnostic Studies: CBC Lab Results  Component Value Date   WBC 11.2* 05/12/2013   HGB 10.4* 05/12/2013   HCT 31.1* 05/12/2013   MCV 90.7 05/12/2013   PLT 369 05/12/2013    BMET    Component Value Date/Time   NA 131* 05/12/2013 0515   K 3.7 05/12/2013 0515   CL 99 05/12/2013 0515   CO2 18* 05/12/2013 0515   GLUCOSE 208* 05/12/2013 0515   BUN 12 05/12/2013 0515   CREATININE 0.66 05/12/2013 0515   CREATININE 1.30* 02/13/2013 1017   CALCIUM 8.9 05/12/2013 0515   GFRNONAA >90 05/12/2013 0515   GFRAA >90 05/12/2013 0515    COAG Lab Results  Component Value Date   INR 0.93 05/09/2013   INR 0.96 02/20/2013   INR 0.9 05/22/2008   No results found for this basename: PTT     Intake/Output Summary (Last 24 hours) at 05/16/13 0822 Last data filed at 05/16/13 1610  Gross per 24 hour  Intake    240 ml  Output   1200 ml  Net   -960 ml   Patient Vitals for the past 24 hrs:  Urine Occurrence  05/15/13 1953 1     Physical Examination  BP Readings from Last 3 Encounters:  05/16/13 119/60  05/16/13 119/60  05/09/13 87/59   Temp Readings from Last 3 Encounters:  05/16/13 98.6 F (37 C) Oral  05/16/13 98.6 F (37 C) Oral  05/09/13 98.1 F (36.7 C) Oral   SpO2 Readings from Last 3 Encounters:  05/16/13 99%  05/16/13 99%  05/09/13 100%   Pulse Readings from Last 3 Encounters:  05/16/13 88  05/16/13 88  05/09/13 101    Pt is A&Ox3  WDWN female with no complaints  Left amputation wound is healing well.  There is good bone coverage in the stump Stump is  warm and well perfused, without drainage; without erythema   Assessment/plan:  [name] is a 57 y.o. female who is s/p Left Procedure(s): AMPUTATION BELOW KNEE  The patient's stump is viable.  Follow-up 4 weeks from surgery  Pending SNF verse CIR  Clinton Gallant [facility] 8:22 AM 05/16/2013   Awaiting SNF BKA healing  Fabienne Bruns, MD [facility] [place] Office: [phone] Pager: [phone]

## 2013-05-16 NOTE — Progress Notes (Signed)
Inpatient Diabetes Program Recommendations  AACE/ADA: New Consensus Statement on Inpatient Glycemic Control (2013)  Target Ranges:  Prepandial:   less than 140 mg/dL      Peak postprandial:   less than 180 mg/dL (1-2 hours)      Critically ill patients:  140 - 180 mg/dL  Results for LARYAH, NEUSER (MRN [id number]) as of 05/16/2013 10:52  Ref. Range 05/14/2013 21:28 05/15/2013 05:58 05/15/2013 11:24 05/15/2013 16:42 05/16/2013 06:13  Glucose-Capillary Latest Range: 70-99 mg/dL 409 (H) 811 (H) 914 (H) 206 (H) 344 (H)   Please start Lantus for HYPERglycemia.  Patient takes it at home. Thank you  Piedad Climes BSN, RN,CDE Inpatient Diabetes Coordinator [phone] (team pager)

## 2013-05-16 NOTE — Progress Notes (Signed)
Clinical Social Worker submitted 30 day note via [facility] for [employer].  CSW reviewed bed offers.  Pt currently does not have any bed offers.  CSW phoned facilities who have not responded-[facility] [place] currently reviewing with their DON.  CSW to continue to follow and assist as needed.   Angelia Mould, MSW, [place] [phone]

## 2013-05-16 NOTE — Progress Notes (Signed)
Physical Therapy Treatment Patient Details Name: [name] MRN: [id number] DOB: 1956-05-10 Today's Date: 05/16/2013 Time: 6967-8938 PT Time Calculation (min): 26 min  PT Assessment / Plan / Recommendation Comments on Treatment Session  Pt demonstrates imptovements in activity tolerance and participation today.  Pt amble to ambulate with assist today as well as perform standing activities.  Spoke at length with patient regarding importance of positioning in brace for extension as patient is currently presenting with deficits in extension ROM.  Will continue to work with patient and progress activity as tolerated. Feel patient will be a great candidate for CIR.    Follow Up Recommendations  CIR     Does the patient have the potential to tolerate intense rehabilitation   Yes     Equipment Recommendations  None recommended by PT    Recommendations for Other Services Rehab consult;Other (comment) (psych consult if appropriate)  Frequency Min 3X/week   Plan Discharge plan remains appropriate    Precautions / Restrictions Precautions Precautions: None Precaution Comments: LBKA (spoke with patient at length regarding positioning)   Pertinent Vitals/Pain 7/10    Mobility  Bed Mobility Bed Mobility: Supine to Sit;Sitting - Scoot to Edge of Bed Supine to Sit: 4: Min guard Sitting - Scoot to [place] of Bed: 4: Min guard Details for Bed Mobility Assistance: VCs for sequencing, able to perform without assist. Pt requiried increased time to perform Transfers Transfers: Sit to Stand;Stand to [employer] Transfers Sit to Stand: 4: Min assist Stand to Sit: 4: Min assist Stand Pivot Transfers: 3: Mod assist Details for Transfer Assistance: VCs for handplacement with no compliance; VCs for upright posture and safety Ambulation/Gait Ambulation/Gait Assistance: 1: +2 Total assist Ambulation/Gait: Patient Percentage: 80% Ambulation Distance (Feet): 14 Feet Assistive device: Rolling  walker Ambulation/Gait Assistance Details: VCs for upright posture and technique, patient with increased pain during ambulation, +2 for bilateral support.   Gait Pattern: Step-to pattern    Exercises Other Exercises Other Exercises: Educated patient again on imporance of extension activities   PT Diagnosis:    PT Problem List:   PT Treatment Interventions:     PT Goals Acute Rehab PT Goals PT Goal Formulation: With patient Time For Goal Achievement: 05/25/13 Potential to Achieve Goals: Good Pt will go Sit to Stand: with modified independence PT Goal: Sit to Stand - Progress: Progressing toward goal Pt will Transfer Bed to Chair/Chair to Bed: with modified independence PT Transfer Goal: Bed to Chair/Chair to Bed - Progress: Progressing toward goal Pt will Ambulate: 1 - 15 feet;with min assist PT Goal: Ambulate - Progress: Progressing toward goal Pt will Perform Home Exercise Program: Independently PT Goal: Perform Home Exercise Program - Progress: Progressing toward goal  Visit Information  Last PT Received On: 05/30/13 Assistance Needed: +1 (+2 for ambulation)    Subjective Data  Subjective: Pt agreeable for OOB Patient Stated Goal: to go home   Cognition  Cognition Arousal/Alertness: Awake/alert Behavior During Therapy: Flat affect Overall Cognitive Status: Within Functional Limits for tasks assessed    Balance  Static Standing Balance Static Standing - Balance Support: Right upper extremity supported Static Standing - Level of Assistance: 3: Mod assist Static Standing - Comment/# of Minutes: multiple activities performed at sink, supported with elbows and assist  End of Session PT - End of Session Equipment Utilized During Treatment: Gait belt Activity Tolerance: Patient limited by pain Patient left: in chair;with call bell/phone within reach;Other (comment) (With OT for remainder of session) Nurse Communication: Mobility status  GP     Fabio Asa 05/16/2013, 12:29 PM Charlotte Crumb, PT DPT  [phone]

## 2013-05-16 NOTE — Progress Notes (Signed)
Occupational Therapy Treatment Patient Details Name: DELORIS MOGER MRN: [id number] DOB: 07/22/1956 Today's Date: 05/16/2013 Time: 4098-1191 OT Time Calculation (min): 26 min  OT Assessment / Plan / Recommendation Comments on Treatment Session Excellent session today with greater pariticipation. Pt ambulated to sink with +2 for safety, doing @ 80%. Pt appears depressed regarding loss of limb and current situation. Discussed issues with pain control with nsg and if there are options for breakthrough pain control. Feel pt is excellent candidate for CIR to return to PLOF. will continue to follow.    Follow Up Recommendations  CIR    Barriers to Discharge       Equipment Recommendations  None recommended by OT    Recommendations for Other Services Rehab consult  Frequency Min 2X/week   Plan Discharge plan needs to be updated    Precautions / Restrictions Precautions Precautions: Fall;Other (comment) (MRSA) Precaution Comments: LBKA Required Braces or Orthoses: Other Brace/Splint (L knee ext splint)   Pertinent Vitals/Pain 6. nsg gave pain meds. Discussed pain control options with nsg    ADL  Grooming: Minimal assistance Where Assessed - Grooming: Supported standing (stood at sink) Lower Body Dressing: Minimal assistance Where Assessed - Lower Body Dressing: Unsupported sitting (slid on shoe) Toilet Transfer: Simulated;+2 Total assistance (for safety. Pt @ 80%) Equipment Used: Gait belt;Rolling walker;Other (comment) (L knee immobilizing splint) Transfers/Ambulation Related to ADLs: +2 for safety and ambulation. Pt min A sit - stand. @ 80% for ambulation using RW ADL Comments: Increased participation today. Pt appears depressed regarding loss of limb.     OT Diagnosis:    OT Problem List:   OT Treatment Interventions:     OT Goals Acute Rehab OT Goals OT Goal Formulation: With patient Time For Goal Achievement: 05/25/13 Potential to Achieve Goals: Good ADL Goals Pt Will  Perform Grooming: with supervision;Standing at sink ADL Goal: Grooming - Progress: Progressing toward goals Pt Will Perform Upper Body Bathing: Independently;Sitting at sink ADL Goal: Upper Body Bathing - Progress: Progressing toward goals Pt Will Perform Lower Body Bathing: with supervision;Sit to stand from chair ADL Goal: Lower Body Bathing - Progress: Progressing toward goals Pt Will Perform Upper Body Dressing: Independently;Sitting, chair ADL Goal: Upper Body Dressing - Progress: Progressing toward goals Pt Will Perform Lower Body Dressing: with supervision;Sit to stand from chair ADL Goal: Lower Body Dressing - Progress: Progressing toward goals Pt Will Transfer to Toilet: with supervision;Comfort height toilet ADL Goal: Toilet Transfer - Progress: Progressing toward goals Pt Will Perform Toileting - Clothing Manipulation: with supervision;Sitting on 3-in-1 or toilet ADL Goal: Toileting - Clothing Manipulation - Progress: Progressing toward goals Pt Will Perform Toileting - Hygiene: with supervision;Leaning right and/or left on 3-in-1/toilet;Sit to stand from 3-in-1/toilet ADL Goal: Toileting - Hygiene - Progress: Progressing toward goals  Visit Information  Last OT Received On: 05/16/13 Assistance Needed: +2 (helpful for ambulation)    Subjective Data      Prior Functioning    independent   Cognition  Cognition Arousal/Alertness: Awake/alert Behavior During Therapy: Flat affect Overall Cognitive Status: No family/caregiver present to determine baseline cognitive functioning (Most likely with baseline cognitive deficits - functional)    Mobility  Bed Mobility Bed Mobility: Supine to Sit Supine to Sit: 5: Supervision Sitting - Scoot to Edge of Bed: 5: Supervision Details for Bed Mobility Assistance: VCs for sequencing, able to perform without assist. Pt requiried increased time to perform Transfers Transfers: Sit to Stand;Stand to Sit Sit to Stand: 4: Min assist Stand  to Sit: 4: Min assist Details for Transfer Assistance: VCs for handplacement with no compliance; VCs for upright posture and safety    Exercises  Other Exercises Other Exercises: Educated pt on importance of keeping L knee extended   Balance Static Standing Balance Static Standing - Balance Support: Right upper extremity supported Static Standing - Level of Assistance: 3: Mod assist Static Standing - Comment/# of Minutes: stood at sinnk to wash hands   End of Session OT - End of Session Equipment Utilized During Treatment: Gait belt Activity Tolerance: Patient tolerated treatment well Patient left: in chair;with call bell/phone within reach;with nursing in room Nurse Communication: Mobility status;Other (comment);Patient requests pain meds (options for better pai control? breakthrough ?)  GO     Kerilyn Cortner,HILLARY 05/16/2013, 2:16 PM [facility], OTR/L  [phone] 05/16/2013

## 2013-05-17 LAB — CREATININE, SERUM
Creatinine, Ser: 0.88 mg/dL (ref 0.50–1.10)
GFR calc Af Amer: 83 mL/min — ABNORMAL LOW (ref >90)
GFR calc non Af Amer: 72 mL/min — ABNORMAL LOW (ref >90)

## 2013-05-17 LAB — GLUCOSE, CAPILLARY: Glucose-Capillary: 349 mg/dL — ABNORMAL HIGH (ref 70–99)

## 2013-05-17 MED ORDER — INSULIN GLARGINE 100 UNIT/ML ~~LOC~~ SOLN
25.0000 [IU] | Freq: Every day | SUBCUTANEOUS | Status: DC
  Administered 2013-05-17: 25 [IU] via SUBCUTANEOUS
  Filled 2013-05-17: qty 0.25

## 2013-05-17 NOTE — Progress Notes (Signed)
Inpatient Diabetes Program Recommendations  AACE/ADA: New Consensus Statement on Inpatient Glycemic Control (2013)  Target Ranges:  Prepandial:   less than 140 mg/dL      Peak postprandial:   less than 180 mg/dL (1-2 hours)      Critically ill patients:  140 - 180 mg/dL  Results for SHIZA, THELEN (MRN [id number]) as of 05/17/2013 09:46  Ref. Range 05/16/2013 11:29 05/16/2013 16:19 05/16/2013 20:48 05/17/2013 05:56 05/17/2013 07:52  Glucose-Capillary Latest Range: 70-99 mg/dL 213 (H) 086 (H) 578 (H) 329 (H) 371 (H)   Diabetes Coordinator spoke with Lianne Cure PA concerning glucose >300.  Telephone order received to start Lantus 25 units at HS with first dose now.   Will follow. Thank you  Piedad Climes BSN, RN,CDE Inpatient Diabetes Coordinator [phone] (team pager)

## 2013-05-17 NOTE — Progress Notes (Signed)
Clinical Social Worker spoke with pt's niece, Annice Pih.  Annice Pih confirmed plan is to return to pt's ALF once rehab is complete.  CSW spoke with [facility].  [facility] reviewing and will phone Covering CSW back-Gina Ingle-[phone]    Angelia Mould, MSW, [employer]

## 2013-05-17 NOTE — Progress Notes (Signed)
Report called to [facility]. Pt is stable and ready for discharge. Awaiting PTAR for transport to facility.

## 2013-05-17 NOTE — Progress Notes (Addendum)
[facility] [place]  Subjective  - no new complaints, the pain is getting btter daily in the left stump.   Objective 115/78 84 99.3 F (37.4 C) (Oral) 18 99%  Intake/Output Summary (Last 24 hours) at 05/17/13 0913 Last data filed at 05/17/13 0300  Gross per 24 hour  Intake    360 ml  Output    650 ml  Net   -290 ml    Left BKA incision clean and dry, no active drainage or erythema.  Assessment/Planning: [name] is a 57 y.o. female who is s/p Left Procedure(s):  AMPUTATION BELOW KNEE  The patient's stump is viable.  Follow-up 4 weeks from surgery  Pending SNF  Once bed is available       Clinton Gallant [facility] 05/17/2013 9:13 AM --  Laboratory Lab Results: No results found for this basename: WBC, HGB, HCT, PLT,  in the last 72 hours BMET  Recent Labs  05/17/13 0425  CREATININE 0.88    COAG Lab Results  Component Value Date   INR 0.93 05/09/2013   INR 0.96 02/20/2013   INR 0.9 05/22/2008   No results found for this basename: PTT      Left BKA continues to heal Probably to SNF today  Fabienne Bruns, MD [facility] [place] Office: [phone] Pager: [phone]

## 2013-05-17 NOTE — Clinical Social Work Note (Signed)
MD signed FL2.  Annice Pih, niece called pt to go over d/c disposition: [facility], SNF.  Pt is agreeable.  CSW contacted Debbie at [facility] to confirm (was confirmed this am, but wanted to re-confirm since later in the day) pt d/c to [facility] today.  CSW awaiting a call from SNF.  CSW sent d/c summary to [facility] electronically.  Vickii Penna, LCSWA [phone]  Clinical Social Work

## 2013-05-18 ENCOUNTER — Telehealth: Payer: Self-pay | Admitting: Vascular Surgery

## 2013-05-18 NOTE — Telephone Encounter (Signed)
Message copied by Jena Gauss on Thu May 18, 2013  3:23 PM ------      Message from: Melene Plan      Created: Wed May 17, 2013  2:53 PM                   ----- Message -----         From: Lars Mage, PA-C         Sent: 05/17/2013   2:18 PM           To: Melene Plan, RN            F/U 4 weeks s/p BKA she is going to a SNF Dr. Arbie Cookey ------

## 2013-05-26 ENCOUNTER — Telehealth: Payer: Self-pay

## 2013-05-26 NOTE — Telephone Encounter (Signed)
Nurse Practitioner call from [facility].  Reported that pt. fell yesterday, @ the nursing home, and questioned about any recommendations from vascular surgeon.  Stated the left stump incision is intact/ incision well -approximated.  States pt. Has some tenderness at the incision.  Reports some redness around incision, which states was actually present prior to the fall.  Upon removing pt's dressing, reports some dried blood; she applied light pressure in the surrounding tissue, and did not note any drainage from the site.  Denies any active bleeding.   Advised to continue to monitor the left stump incision and to report any signs of dehiscence or infection.  Encouraged to keep incision clean/ dry.  Next appt. 7/8; and encouraged to call sooner if symptoms worsen.  NP agrees with plan.

## 2013-06-07 ENCOUNTER — Telehealth: Payer: Self-pay | Admitting: *Deleted

## 2013-06-07 NOTE — Telephone Encounter (Signed)
Will wait to schedule pt for PAP smear when she is transferred back to Assisted Living after completion of "therapy" at SNF.

## 2013-06-12 ENCOUNTER — Encounter: Payer: Self-pay | Admitting: Vascular Surgery

## 2013-06-13 ENCOUNTER — Ambulatory Visit (INDEPENDENT_AMBULATORY_CARE_PROVIDER_SITE_OTHER): Payer: Medicaid Other | Admitting: Vascular Surgery

## 2013-06-13 ENCOUNTER — Encounter: Payer: Self-pay | Admitting: Vascular Surgery

## 2013-06-13 VITALS — BP 109/75 | HR 86 | Resp 16 | Ht 64.0 in | Wt 146.0 lb

## 2013-06-13 DIAGNOSIS — L98499 Non-pressure chronic ulcer of skin of other sites with unspecified severity: Secondary | ICD-10-CM

## 2013-06-13 DIAGNOSIS — I70209 Unspecified atherosclerosis of native arteries of extremities, unspecified extremity: Secondary | ICD-10-CM

## 2013-06-15 ENCOUNTER — Other Ambulatory Visit: Payer: Self-pay

## 2013-06-22 ENCOUNTER — Other Ambulatory Visit: Payer: Medicaid Other

## 2013-06-26 ENCOUNTER — Encounter: Payer: Self-pay | Admitting: Vascular Surgery

## 2013-06-26 NOTE — Progress Notes (Signed)
The patient presents today for one month followup of below knee amputation. Her incision is healing quite well and her 8 patient site staples were removed today. She will be referred to prosthetics for potential below-knee prosthetic and see [place] on an as-needed basis

## 2013-07-06 ENCOUNTER — Ambulatory Visit: Payer: Medicaid Other | Admitting: Infectious Disease

## 2013-07-31 ENCOUNTER — Other Ambulatory Visit: Payer: Medicaid Other

## 2013-08-01 ENCOUNTER — Other Ambulatory Visit (INDEPENDENT_AMBULATORY_CARE_PROVIDER_SITE_OTHER): Payer: Medicaid Other

## 2013-08-01 DIAGNOSIS — B20 Human immunodeficiency virus [HIV] disease: Secondary | ICD-10-CM

## 2013-08-01 LAB — COMPLETE METABOLIC PANEL WITH GFR
ALT: 16 U/L (ref 0–35)
AST: 24 U/L (ref 0–37)
Albumin: 3.5 g/dL (ref 3.5–5.2)
Alkaline Phosphatase: 75 U/L (ref 39–117)
BUN: 13 mg/dL (ref 6–23)
Creat: 0.85 mg/dL (ref 0.50–1.10)
Potassium: 3.9 mEq/L (ref 3.5–5.3)

## 2013-08-01 LAB — CBC WITH DIFFERENTIAL/PLATELET
Eosinophils Absolute: 0.2 10*3/uL (ref 0.0–0.7)
Lymphocytes Relative: 43 % (ref 12–46)
Lymphs Abs: 2.7 10*3/uL (ref 0.7–4.0)
MCH: 29.6 pg (ref 26.0–34.0)
Neutro Abs: 2.7 10*3/uL (ref 1.7–7.7)
Neutrophils Relative %: 43 % (ref 43–77)
Platelets: 241 10*3/uL (ref 150–400)
RBC: 4.05 MIL/uL (ref 3.87–5.11)
WBC: 6.3 10*3/uL (ref 4.0–10.5)

## 2013-08-02 LAB — T-HELPER CELL (CD4) - (RCID CLINIC ONLY)
CD4 % Helper T Cell: 36 % (ref 33–55)
CD4 T Cell Abs: 960 /uL (ref 400–2700)

## 2013-08-03 LAB — HIV-1 RNA QUANT-NO REFLEX-BLD
HIV 1 RNA Quant: <20 copies/mL (ref <20)
HIV-1 RNA Quant, Log: <1.3 {Log} (ref <1.30)

## 2013-08-17 ENCOUNTER — Encounter (INDEPENDENT_AMBULATORY_CARE_PROVIDER_SITE_OTHER): Payer: Medicaid Other | Admitting: *Deleted

## 2013-08-17 ENCOUNTER — Ambulatory Visit (INDEPENDENT_AMBULATORY_CARE_PROVIDER_SITE_OTHER): Payer: Medicaid Other | Admitting: Vascular Surgery

## 2013-08-17 ENCOUNTER — Encounter: Payer: Self-pay | Admitting: Vascular Surgery

## 2013-08-17 ENCOUNTER — Telehealth: Payer: Self-pay

## 2013-08-17 VITALS — BP 105/81 | HR 84 | Temp 98.1°F | Ht 64.0 in | Wt 146.0 lb

## 2013-08-17 DIAGNOSIS — M79609 Pain in unspecified limb: Secondary | ICD-10-CM

## 2013-08-17 DIAGNOSIS — M7989 Other specified soft tissue disorders: Secondary | ICD-10-CM

## 2013-08-17 DIAGNOSIS — T879 Unspecified complications of amputation stump: Secondary | ICD-10-CM

## 2013-08-17 DIAGNOSIS — G8918 Other acute postprocedural pain: Secondary | ICD-10-CM

## 2013-08-17 DIAGNOSIS — T8789 Other complications of amputation stump: Secondary | ICD-10-CM

## 2013-08-17 DIAGNOSIS — Z48812 Encounter for surgical aftercare following surgery on the circulatory system: Secondary | ICD-10-CM

## 2013-08-17 MED ORDER — GABAPENTIN 300 MG PO CAPS: 300.0000 mg | ORAL_CAPSULE | Freq: Three times a day (TID) | ORAL | Status: AC

## 2013-08-17 NOTE — Telephone Encounter (Signed)
Phone call from nurse at [facility].  Reports pt. Has had increased swelling of left below knee amputation in area of stump.  States has tenderness and warmth in area of stump, but no redness noted.  Denies any open sores.  States pt. C/o of chilling at night, but no reported fever.  Discussed with Dr. Darrick Penna.  Recommends to bring pt. in today for a left lower extremity venous duplex to rule-out DVT/ "lab only".  Notified nurse at [facility] of plan to perform left LE duplex to r/o DVT; agrees w/ plan.  Appt. Given for 3:00 PM.

## 2013-08-17 NOTE — Progress Notes (Signed)
Patient is a 57 year old female who had a left below-knee amputation by Dr. Arbie Cookey several months ago. She was recently fitted with a prosthetic. She complains of pain in her left below-knee amputation stump as well as some occasional swelling. She does not really describe pain in her amputated foot. She states most of the pain centers around the distal end of the amputation stump. She has difficulty sleeping at night. She has followup scheduled at [facility] on the 18th of September.  Physical exam:  Filed Vitals:   08/17/13 1517  BP: 105/81  Pulse: 84  Temp: 98.1 F (36.7 C)  TempSrc: Oral  Height: 5\' 4"  (1.626 m)  Weight: 146 lb (66.225 kg)  SpO2: 100%    Left below-knee amputation is well-healed no point tenderness no significant edema skin is intact  Data: Venous duplex scan the left lower extremity was performed which showed no DVT  Assessment: Left below-knee amputation stump pain possibly neuropathic versus irritation from her new prosthetic Plan: The patient was started on Neurontin 300 mg 3 times a day. She will keep her appointment with [facility] on September 18. She will followup with [place] on as-needed basis.  Fabienne Bruns, MD [facility] [place] Office: [phone] Pager: [phone]

## 2013-08-22 ENCOUNTER — Ambulatory Visit (INDEPENDENT_AMBULATORY_CARE_PROVIDER_SITE_OTHER): Payer: Medicaid Other | Admitting: Infectious Disease

## 2013-08-22 ENCOUNTER — Encounter: Payer: Self-pay | Admitting: Infectious Disease

## 2013-08-22 VITALS — BP 99/68 | HR 80 | Temp 98.4°F | Ht 64.0 in | Wt 130.0 lb

## 2013-08-22 DIAGNOSIS — E1059 Type 1 diabetes mellitus with other circulatory complications: Secondary | ICD-10-CM

## 2013-08-22 DIAGNOSIS — Z23 Encounter for immunization: Secondary | ICD-10-CM

## 2013-08-22 DIAGNOSIS — F3289 Other specified depressive episodes: Secondary | ICD-10-CM

## 2013-08-22 DIAGNOSIS — S88119A Complete traumatic amputation at level between knee and ankle, unspecified lower leg, initial encounter: Secondary | ICD-10-CM

## 2013-08-22 DIAGNOSIS — F329 Major depressive disorder, single episode, unspecified: Secondary | ICD-10-CM

## 2013-08-22 DIAGNOSIS — B20 Human immunodeficiency virus [HIV] disease: Secondary | ICD-10-CM

## 2013-08-22 DIAGNOSIS — Z89512 Acquired absence of left leg below knee: Secondary | ICD-10-CM

## 2013-08-22 DIAGNOSIS — E1159 Type 2 diabetes mellitus with other circulatory complications: Secondary | ICD-10-CM

## 2013-08-22 DIAGNOSIS — I739 Peripheral vascular disease, unspecified: Secondary | ICD-10-CM

## 2013-08-22 NOTE — Progress Notes (Signed)
  Subjective:    Patient ID: [name], female    DOB: 18-Jul-1956, 57 y.o.   MRN: [id number]  HPI  [name] is a 57 y.o. female with HIV infection who has been superbly well controlled on her  antiviral regimen, atripla with undetectable viral load and health cd4 count who has been found to have severe PVD and ultimately underwent Left BKA.   She is in good spirits and tolerating her meds fairly well. She does have SOME depressive ssx but is able to divert her mind from these and does not thinkg they are any worse on her atripla.     Review of Systems  Constitutional: Negative for activity change, appetite change and unexpected weight change.  HENT: Negative for rhinorrhea, sneezing, trouble swallowing and sinus pressure.   Eyes: Negative for photophobia and visual disturbance.  Respiratory: Negative for chest tightness, shortness of breath, wheezing and stridor.   Cardiovascular: Negative for palpitations and leg swelling.  Gastrointestinal: Negative for diarrhea, constipation, blood in stool, abdominal distention and anal bleeding.  Genitourinary: Negative for dysuria, hematuria, flank pain and difficulty urinating.  Musculoskeletal: Positive for gait problem. Negative for back pain.  Skin: Positive for color change. Negative for pallor and wound.  Neurological: Negative for dizziness, tremors and light-headedness.  Hematological: Negative for adenopathy. Does not bruise/bleed easily.  Psychiatric/Behavioral: Negative for behavioral problems, confusion, sleep disturbance, dysphoric mood, decreased concentration and agitation.       Objective:   Physical Exam  Constitutional: She is oriented to person, place, and time. She appears well-developed and well-nourished. No distress.  HENT:  Head: Normocephalic and atraumatic.  Mouth/Throat: Oropharynx is clear and moist. No oropharyngeal exudate.  Eyes: Conjunctivae and EOM are normal. No scleral icterus.  Neck: Normal range of  motion. Neck supple. No JVD present.  Cardiovascular: Normal rate, regular rhythm and normal heart sounds.  Exam reveals no gallop and no friction rub.   No murmur heard. Pulmonary/Chest: Effort normal and breath sounds normal. No respiratory distress. She has no wheezes. She has no rales. She exhibits no tenderness.  Abdominal: She exhibits no distension and no mass. There is no tenderness. There is no rebound and no guarding.  Musculoskeletal: She exhibits tenderness. She exhibits no edema.       Legs: Lymphadenopathy:    She has no cervical adenopathy.  Neurological: She is alert and oriented to person, place, and time. She exhibits normal muscle tone. Coordination normal.  Skin: Skin is warm and dry. She is not diaphoretic. There is erythema. No pallor.  Psychiatric: She has a normal mood and affect. Her behavior is normal. Judgment and thought content normal.          Assessment & Plan:    HIV: perfectly controlled on atripla, rtc in 3-4 months. I offered her change to [facility] but she refused. NO complera possible give her GERD and PPI need. [facility] raises issue of ABC and CAD risk from DAD study  Left AKA site: has had some tenderness following with vVS  DM: followup with her endocrinologist  HCM: flu shot  Depression: not desirign new m eds or counselling at this time

## 2013-10-17 ENCOUNTER — Ambulatory Visit (HOSPITAL_COMMUNITY): Payer: Medicaid Other | Attending: Cardiology

## 2013-10-17 ENCOUNTER — Encounter: Payer: Self-pay | Admitting: Cardiology

## 2013-10-17 DIAGNOSIS — I771 Stricture of artery: Secondary | ICD-10-CM | POA: Insufficient documentation

## 2013-10-17 DIAGNOSIS — Z8673 Personal history of transient ischemic attack (TIA), and cerebral infarction without residual deficits: Secondary | ICD-10-CM | POA: Insufficient documentation

## 2013-10-17 DIAGNOSIS — R0989 Other specified symptoms and signs involving the circulatory and respiratory systems: Secondary | ICD-10-CM | POA: Insufficient documentation

## 2013-10-17 DIAGNOSIS — I6529 Occlusion and stenosis of unspecified carotid artery: Secondary | ICD-10-CM | POA: Insufficient documentation

## 2013-10-17 DIAGNOSIS — I658 Occlusion and stenosis of other precerebral arteries: Secondary | ICD-10-CM | POA: Insufficient documentation

## 2013-10-17 DIAGNOSIS — F17200 Nicotine dependence, unspecified, uncomplicated: Secondary | ICD-10-CM | POA: Insufficient documentation

## 2013-10-17 DIAGNOSIS — E119 Type 2 diabetes mellitus without complications: Secondary | ICD-10-CM | POA: Insufficient documentation

## 2013-10-17 DIAGNOSIS — I739 Peripheral vascular disease, unspecified: Secondary | ICD-10-CM | POA: Insufficient documentation

## 2013-11-21 ENCOUNTER — Other Ambulatory Visit: Payer: Medicaid Other

## 2013-11-21 ENCOUNTER — Other Ambulatory Visit (HOSPITAL_COMMUNITY)
Admission: RE | Admit: 2013-11-21 | Discharge: 2013-11-21 | Disposition: A | Discharge status: Home / Self Care | Payer: Medicaid Other | Source: Ambulatory Visit | Attending: Infectious Disease | Admitting: Infectious Disease

## 2013-11-21 ENCOUNTER — Ambulatory Visit (INDEPENDENT_AMBULATORY_CARE_PROVIDER_SITE_OTHER): Payer: Medicaid Other | Admitting: Infectious Disease

## 2013-11-21 DIAGNOSIS — Z01419 Encounter for gynecological examination (general) (routine) without abnormal findings: Secondary | ICD-10-CM | POA: Insufficient documentation

## 2013-11-21 DIAGNOSIS — Z113 Encounter for screening for infections with a predominantly sexual mode of transmission: Secondary | ICD-10-CM | POA: Insufficient documentation

## 2013-11-21 DIAGNOSIS — B20 Human immunodeficiency virus [HIV] disease: Secondary | ICD-10-CM

## 2013-11-21 DIAGNOSIS — Z124 Encounter for screening for malignant neoplasm of cervix: Secondary | ICD-10-CM

## 2013-11-21 LAB — COMPLETE METABOLIC PANEL WITH GFR
ALT: 47 U/L — ABNORMAL HIGH (ref 0–35)
Albumin: 3.7 g/dL (ref 3.5–5.2)
CO2: 21 mEq/L (ref 19–32)
Calcium: 9.1 mg/dL (ref 8.4–10.5)
Chloride: 109 mEq/L (ref 96–112)
GFR, Est African American: 71 mL/min
Potassium: 4 mEq/L (ref 3.5–5.3)
Sodium: 140 mEq/L (ref 135–145)
Total Protein: 7.1 g/dL (ref 6.0–8.3)

## 2013-11-21 LAB — CBC WITH DIFFERENTIAL/PLATELET
Basophils Absolute: 0.1 10*3/uL (ref 0.0–0.1)
Basophils Relative: 1 % (ref 0–1)
Eosinophils Absolute: 0.2 10*3/uL (ref 0.0–0.7)
MCH: 30.7 pg (ref 26.0–34.0)
MCHC: 32.8 g/dL (ref 30.0–36.0)
Monocytes Relative: 9 % (ref 3–12)
Neutrophils Relative %: 35 % — ABNORMAL LOW (ref 43–77)
Platelets: 246 10*3/uL (ref 150–400)
RDW: 14.6 % (ref 11.5–15.5)

## 2013-11-21 LAB — LIPID PANEL: Cholesterol: 125 mg/dL (ref 0–200)

## 2013-11-21 NOTE — Progress Notes (Signed)
Patient ID: [name], female   DOB: 1956/02/10, 57 y.o.   MRN: [id number] This was RN visit

## 2013-11-21 NOTE — Progress Notes (Signed)
  Subjective:     [name] is a 57 y.o. woman who comes in today for a  pap smear only. Previous abnormal Pap smears: no. Contraception: condoms  Objective:    There were no vitals taken for this visit. Pelvic Exam: Pap smear obtained.   Assessment:    Screening pap smear.   Plan:  Pt given educational materials re: HIV and women, self-esteem, BSE, nutrition and diet management, PAP smears and partner safety. Pt given condoms.   Follow up in one year, or as indicated by Pap results.

## 2013-11-22 LAB — HIV-1 RNA QUANT-NO REFLEX-BLD
HIV 1 RNA Quant: <20 copies/mL (ref <20)
HIV-1 RNA Quant, Log: <1.3 {Log} (ref <1.30)

## 2013-11-24 LAB — HLA B*5701: HLA-B*5701 w/rflx HLA-B High: NEGATIVE

## 2013-11-24 LAB — HEPATITIS C RNA QUANTITATIVE: HCV Quantitative: 1060000 IU/mL — ABNORMAL HIGH (ref <15)

## 2013-11-27 ENCOUNTER — Encounter: Payer: Self-pay | Admitting: *Deleted

## 2013-11-27 LAB — HEPATITIS C GENOTYPE

## 2013-12-25 ENCOUNTER — Telehealth: Payer: Self-pay | Admitting: Infectious Disease

## 2013-12-25 DIAGNOSIS — E079 Disorder of thyroid, unspecified: Secondary | ICD-10-CM

## 2013-12-25 NOTE — Telephone Encounter (Signed)
[name], can you set up [name] with thyroid ultrasound and come in for blood work. She had abnormal thyroid seen on carotid doppler several months ago and this needs evaluation.   Does she have a primary besides [place]?   I think she also needs endocrine referral and if nto followed by a primary needs a PCP besides me

## 2013-12-26 NOTE — Progress Notes (Signed)
She has an appointment on 2/4, would you like her to come sooner.

## 2013-12-27 ENCOUNTER — Other Ambulatory Visit: Payer: Medicaid Other

## 2013-12-27 NOTE — Telephone Encounter (Signed)
Patient has a PCP at her SNF.  Updated the care team to reflect this.  Faxed recent records to Rhonda's attention.  Suanne Marker will order lab work, coordinate an ultrasound, and take care of any endocrinology referrals.

## 2014-01-10 ENCOUNTER — Ambulatory Visit: Payer: Medicaid Other

## 2014-01-10 ENCOUNTER — Ambulatory Visit (INDEPENDENT_AMBULATORY_CARE_PROVIDER_SITE_OTHER): Payer: Medicaid Other | Admitting: Infectious Disease

## 2014-01-10 ENCOUNTER — Encounter: Payer: Self-pay | Admitting: Infectious Disease

## 2014-01-10 VITALS — BP 121/72 | HR 81 | Temp 97.5°F | Wt 139.0 lb

## 2014-01-10 DIAGNOSIS — B20 Human immunodeficiency virus [HIV] disease: Secondary | ICD-10-CM

## 2014-01-10 DIAGNOSIS — F329 Major depressive disorder, single episode, unspecified: Secondary | ICD-10-CM

## 2014-01-10 DIAGNOSIS — S88119A Complete traumatic amputation at level between knee and ankle, unspecified lower leg, initial encounter: Secondary | ICD-10-CM

## 2014-01-10 DIAGNOSIS — E1059 Type 1 diabetes mellitus with other circulatory complications: Secondary | ICD-10-CM

## 2014-01-10 DIAGNOSIS — G546 Phantom limb syndrome with pain: Secondary | ICD-10-CM

## 2014-01-10 DIAGNOSIS — G547 Phantom limb syndrome without pain: Secondary | ICD-10-CM

## 2014-01-10 DIAGNOSIS — Z89519 Acquired absence of unspecified leg below knee: Secondary | ICD-10-CM

## 2014-01-10 DIAGNOSIS — B1920 Unspecified viral hepatitis C without hepatic coma: Secondary | ICD-10-CM

## 2014-01-10 DIAGNOSIS — B1710 Acute hepatitis C without hepatic coma: Secondary | ICD-10-CM

## 2014-01-10 MED ORDER — DOLUTEGRAVIR SODIUM 50 MG PO TABS: 50.0000 mg | ORAL_TABLET | Freq: Every day | ORAL | Status: DC

## 2014-01-10 MED ORDER — DOLUTEGRAVIR SODIUM 50 MG PO TABS: 50.0000 mg | ORAL_TABLET | Freq: Every day | ORAL | Status: AC

## 2014-01-10 MED ORDER — SERTRALINE HCL 100 MG PO TABS: 100.0000 mg | ORAL_TABLET | Freq: Every day | ORAL | Status: DC

## 2014-01-10 MED ORDER — EMTRICITABINE-TENOFOVIR DF 200-300 MG PO TABS: 1.0000 | ORAL_TABLET | Freq: Every day | ORAL | Status: DC

## 2014-01-10 MED ORDER — EMTRICITABINE-TENOFOVIR DF 200-300 MG PO TABS: 1.0000 | ORAL_TABLET | Freq: Every day | ORAL | Status: AC

## 2014-01-10 NOTE — Progress Notes (Signed)
Subjective:    Patient ID: [name], female    DOB: 13-Sep-1956, 58 y.o.   MRN: [id number]  HPI   [name] is a 59 y.o. female with HIV infection who has been superbly well controlled on her  antiviral regimen, atripla with undetectable viral load and health cd4 count who has been found to have severe PVD and ultimately underwent Left BKA.   He continues to suffer from depressive symptoms in part related to loss of her limb. She also is having pain laterally in her left knee. It has an area of ulceration this.  She is currently on sertraline 50 mg daily.  We again discussed changing her antiviral regimen away from a Sustiva based regimen and she was open to this.  Past discussed need to try to treat her hepatitis C end-stage or liver disease. Labial to treat her through our clinic directly and also another possibility would be through a clinical trial through the AIDS all trials group studies.    Review of Systems  Constitutional: Negative for activity change, appetite change and unexpected weight change.  HENT: Negative for rhinorrhea, sinus pressure, sneezing and trouble swallowing.   Eyes: Negative for photophobia and visual disturbance.  Respiratory: Negative for chest tightness, shortness of breath, wheezing and stridor.   Cardiovascular: Negative for palpitations and leg swelling.  Gastrointestinal: Negative for diarrhea, constipation, blood in stool, abdominal distention and anal bleeding.  Genitourinary: Negative for dysuria, hematuria, flank pain and difficulty urinating.  Musculoskeletal: Positive for gait problem. Negative for back pain.  Skin: Positive for color change. Negative for pallor and wound.  Neurological: Negative for dizziness, tremors and light-headedness.  Hematological: Negative for adenopathy. Does not bruise/bleed easily.  Psychiatric/Behavioral: Positive for dysphoric mood. Negative for behavioral problems, confusion, sleep disturbance,  self-injury, decreased concentration and agitation.       Objective:   Physical Exam  Constitutional: She is oriented to person, place, and time. She appears well-developed and well-nourished. No distress.  HENT:  Head: Normocephalic and atraumatic.  Mouth/Throat: Oropharynx is clear and moist. No oropharyngeal exudate.  Eyes: Conjunctivae and EOM are normal. No scleral icterus.  Neck: Normal range of motion. Neck supple. No JVD present.  Cardiovascular: Normal rate, regular rhythm and normal heart sounds.  Exam reveals no gallop and no friction rub.   No murmur heard. Pulmonary/Chest: Effort normal and breath sounds normal. No respiratory distress. She has no wheezes. She has no rales. She exhibits no tenderness.  Abdominal: She exhibits no distension and no mass. There is no tenderness. There is no rebound and no guarding.  Musculoskeletal: She exhibits tenderness. She exhibits no edema.       Legs: Lymphadenopathy:    She has no cervical adenopathy.  Neurological: She is alert and oriented to person, place, and time. She exhibits normal muscle tone. Coordination normal.  Skin: Skin is warm and dry. She is not diaphoretic. There is erythema. No pallor.  Psychiatric: She has a normal mood and affect. Her behavior is normal. Judgment and thought content normal.              Assessment & Plan:    HIV: perfectly controlled on atripla, Change to TIVICAY and Cochran AS IRON, CALCIUM OR MAG SUPPLEMENTS  RTC FOR LABS IN ONE MONTH  Hep C: genotype 1a, VL over million. Will check ANA iron studies PT/INR, fibroscan. Abdominal ultrasound. Will consider treating her with HARVONI if Medicaid  will cover this vs Hep C all oral regimen via ACTG. If latter she may have to switch to Isentress and truvada for trial  Depression: Increased sertraline 200 mg daily. I spent greater than 40 minutes with the patient including greater than 50% of time  in face to face counsel of the patient and in coordination of their care.   Left AKA site: has had some tenderness following with vVS, consider plain films if not done  DM: followup with her endocrinologist

## 2014-01-11 LAB — HEPATIC FUNCTION PANEL
ALT: 45 U/L — AB (ref 0–35)
AST: 55 U/L — AB (ref 0–37)
Albumin: 3.4 g/dL — ABNORMAL LOW (ref 3.5–5.2)
Alkaline Phosphatase: 85 U/L (ref 39–117)
Bilirubin, Direct: 0.1 mg/dL (ref 0.0–0.3)
Indirect Bilirubin: 0.2 mg/dL (ref 0.2–1.2)
Total Bilirubin: 0.3 mg/dL (ref 0.2–1.2)
Total Protein: 7 g/dL (ref 6.0–8.3)

## 2014-01-11 LAB — PROTIME-INR
INR: 1.01 (ref <1.50)
PROTHROMBIN TIME: 13.2 s (ref 11.6–15.2)

## 2014-01-11 LAB — IRON: Iron: 53 ug/dL (ref 42–145)

## 2014-01-11 LAB — HEPATITIS PANEL, ACUTE
HCV Ab: REACTIVE — AB
Hep A IgM: NONREACTIVE
Hep B C IgM: NONREACTIVE
Hepatitis B Surface Ag: NEGATIVE

## 2014-01-11 LAB — ANA: ANA: NEGATIVE

## 2014-01-19 ENCOUNTER — Other Ambulatory Visit: Payer: Self-pay | Admitting: Licensed Clinical Social Worker

## 2014-01-19 DIAGNOSIS — B1920 Unspecified viral hepatitis C without hepatic coma: Secondary | ICD-10-CM

## 2014-01-19 NOTE — Addendum Note (Signed)
Addended by: Jarrett Ables D on: 01/19/2014 09:10 AM   Modules accepted: Orders

## 2014-01-30 ENCOUNTER — Ambulatory Visit (HOSPITAL_COMMUNITY): Payer: Medicaid Other

## 2014-01-31 ENCOUNTER — Other Ambulatory Visit: Payer: Medicaid Other

## 2014-02-05 ENCOUNTER — Ambulatory Visit (HOSPITAL_COMMUNITY): Payer: Medicaid Other

## 2014-02-13 ENCOUNTER — Ambulatory Visit (HOSPITAL_COMMUNITY): Admission: RE | Admit: 2014-02-13 | Payer: Medicaid Other | Source: Ambulatory Visit

## 2014-02-13 ENCOUNTER — Telehealth: Payer: Self-pay | Admitting: *Deleted

## 2014-02-13 NOTE — Telephone Encounter (Signed)
[name] called to see if patient should keep appt with Dr. Tommy Medal for tomorrow because she did not go for her ultrasound appointment. Per Dr. Tommy Medal she should keep appt. They said they were not aware that she needed to have nothing to eat or drink after midnight prior to the test. [name]

## 2014-02-14 ENCOUNTER — Ambulatory Visit (INDEPENDENT_AMBULATORY_CARE_PROVIDER_SITE_OTHER): Payer: Medicaid Other | Admitting: Infectious Disease

## 2014-02-14 ENCOUNTER — Encounter: Payer: Self-pay | Admitting: Infectious Disease

## 2014-02-14 VITALS — BP 157/86 | HR 75 | Temp 98.2°F

## 2014-02-14 DIAGNOSIS — F3289 Other specified depressive episodes: Secondary | ICD-10-CM

## 2014-02-14 DIAGNOSIS — F329 Major depressive disorder, single episode, unspecified: Secondary | ICD-10-CM

## 2014-02-14 DIAGNOSIS — B1920 Unspecified viral hepatitis C without hepatic coma: Secondary | ICD-10-CM

## 2014-02-14 DIAGNOSIS — B1710 Acute hepatitis C without hepatic coma: Secondary | ICD-10-CM

## 2014-02-14 DIAGNOSIS — T879 Unspecified complications of amputation stump: Secondary | ICD-10-CM

## 2014-02-14 DIAGNOSIS — B20 Human immunodeficiency virus [HIV] disease: Secondary | ICD-10-CM

## 2014-02-14 DIAGNOSIS — F32A Depression, unspecified: Secondary | ICD-10-CM

## 2014-02-14 DIAGNOSIS — E1059 Type 1 diabetes mellitus with other circulatory complications: Secondary | ICD-10-CM

## 2014-02-14 NOTE — Progress Notes (Signed)
Subjective:    Patient ID: [name], female    DOB: 05-15-1956, 58 y.o.   MRN: [id number]  HPI   [name] is a 58 y.o. female with HIV infection who has been superbly well controlled on her  antiviral regimen, atripla with undetectable viral load and health cd4 count who has been found to have severe PVD and ultimately underwent Left BKA.   We changed her to Tivicay and Truvada and were attempting to get her a Fibroscan to assess her Hep C but this had not yet been done.  She is c/o pain at her BKA site    Review of Systems  Constitutional: Negative for activity change, appetite change and unexpected weight change.  HENT: Negative for rhinorrhea, sinus pressure, sneezing and trouble swallowing.   Eyes: Negative for photophobia and visual disturbance.  Respiratory: Negative for chest tightness, shortness of breath, wheezing and stridor.   Cardiovascular: Negative for palpitations and leg swelling.  Gastrointestinal: Negative for diarrhea, constipation, blood in stool, abdominal distention and anal bleeding.  Genitourinary: Negative for dysuria, hematuria, flank pain and difficulty urinating.  Musculoskeletal: Positive for gait problem. Negative for back pain.  Skin: Positive for color change. Negative for pallor and wound.  Neurological: Negative for dizziness, tremors and light-headedness.  Hematological: Negative for adenopathy. Does not bruise/bleed easily.  Psychiatric/Behavioral: Positive for dysphoric mood. Negative for behavioral problems, confusion, sleep disturbance, self-injury, decreased concentration and agitation.       Objective:   Physical Exam  Constitutional: She is oriented to person, place, and time. She appears well-developed and well-nourished. No distress.  HENT:  Head: Normocephalic and atraumatic.  Mouth/Throat: Oropharynx is clear and moist. No oropharyngeal exudate.  Eyes: Conjunctivae and EOM are normal. No scleral icterus.  Neck: Normal  range of motion. Neck supple. No JVD present.  Cardiovascular: Normal rate, regular rhythm and normal heart sounds.  Exam reveals no gallop and no friction rub.   No murmur heard. Pulmonary/Chest: Effort normal and breath sounds normal. No respiratory distress. She has no wheezes. She has no rales. She exhibits no tenderness.  Abdominal: She exhibits no distension and no mass. There is no tenderness. There is no rebound and no guarding.  Musculoskeletal: She exhibits tenderness. She exhibits no edema.       Legs: Lymphadenopathy:    She has no cervical adenopathy.  Neurological: She is alert and oriented to person, place, and time. She exhibits normal muscle tone. Coordination normal.  Skin: Skin is warm and dry. She is not diaphoretic. There is erythema. No pallor.  Psychiatric: She has a normal mood and affect. Her behavior is normal. Judgment and thought content normal.              Assessment & Plan:   HEP C genotype 1a, VL > 1 million. I have ordred fibrosure today and she has fibroscan and US abdomen pending. She NEEDS to be treated esp since she has HIV coinfection. It is more a matter of convincing Medicaid to pay for 12 weeks of Harvoni. If they will not then hopefully we can use Medicaid denial to procure HARVONI via [facility] pt assistance. I spent greater than 25 minutes with the patient including greater than 50% of time in face to face counsel of the patient and in coordination of their care.    HIV: perfectly controlled on atripla, Changd  to [employer] and TRUVADA, SHE ACTUALLY DOES NEED HER HIV LABS rechecked on this new regimen--and will have to bring  back to check these labs.   SHE SHOULD NOT TAKE HER TIVICAY AT SAME TIME AS IRON, CALCIUM OR MAG SUPPLEMENTS  Depression: Increased sertraline 200 mg daily.  Left AKA site: has had some tenderness following with vVS, pt still frustrated and will refer her to orthopedics as well   DM: followup with her  endocrinologist

## 2014-02-20 LAB — HEPATITIS C VIRUS FIBROSURE
ALT (SGPT)(02): 55 IU/L — AB (ref 0–40)
APOLIPOPROTEIN A-1(02): 159 mg/dL (ref 110–205)
Alpha-2-Macroglobulin, Qn: 379 mg/dL — ABNORMAL HIGH (ref 110–276)
Bilirubin, total: 0.3 mg/dL (ref 0.0–1.2)
FIBROSIS SCORE(01): 0.65 — AB (ref 0.00–0.21)
GGT(02): 325 IU/L — AB (ref 0–60)
Haptoglobin: 96 mg/dL (ref 34–200)
Necroinflammatory Activity Score: 0.45 — ABNORMAL HIGH (ref 0.00–0.17)

## 2014-02-20 NOTE — Patient Instructions (Signed)
Your procedure is scheduled on:  02/27/14  Report to [facility] at 07:30 AM.  Call this number if you have problems the morning of surgery: [phone]   Remember:   Do not eat food or drink liquids after midnight.   Take these medicines the morning of surgery with A SIP OF WATER: Metoprolol, Omeprazole, Sertraline, Gabapentin,  Hydroxyzine, Truvada and Tivicay. You may take your Hydrocodone and Zofran if needed. Take only half of your dose of  Lantus the night prior to surgery.   Do not wear jewelry, make-up or nail polish.  Do not wear lotions, powders, or perfumes. You may wear deodorant.  Do not bring valuables to the hospital.  [facility] is not responsible for any belongings or valuables.               Contacts, dentures or bridgework may not be worn into surgery.               Patients discharged the day of surgery will not be allowed to drive home.   Special Instructions: Start using your eye drops prior to surgery as directed by your eye doctor.   Please read over the following fact sheets that you were given: Anesthesia Post-op Instructions and Care and Recovery After Surgery     Cataract Surgery  A cataract is a clouding of the lens of the eye. When a lens becomes cloudy, vision is reduced based on the degree and nature of the clouding. Surgery may be needed to improve vision. Surgery removes the cloudy lens and usually replaces it with a substitute lens (intraocular lens, IOL). LET YOUR EYE DOCTOR KNOW ABOUT:  Allergies to food or medicine.  Medicines taken including herbs, eyedrops, over-the-counter medicines, and creams.  Use of steroids (by mouth or creams).  Previous problems with anesthetics or numbing medicine.  History of bleeding problems or blood clots.  Previous surgery.  Other health problems, including diabetes and kidney problems.  Possibility of pregnancy, if this applies. RISKS AND COMPLICATIONS  Infection.  Inflammation of the eyeball  (endophthalmitis) that can spread to both eyes (sympathetic ophthalmia).  Poor wound healing.  If an IOL is inserted, it can later fall out of proper position. This is very uncommon.  Clouding of the part of your eye that holds an IOL in place. This is called an "after-cataract." These are uncommon, but easily treated. BEFORE THE PROCEDURE  Do not eat or drink anything except small amounts of water for 8 to 12 before your surgery, or as directed by your caregiver.  Unless you are told otherwise, continue any eyedrops you have been prescribed.  Talk to your primary caregiver about all other medicines that you take (both prescription and non-prescription). In some cases, you may need to stop or change medicines near the time of your surgery. This is most important if you are taking blood-thinning medicine.Do not stop medicines unless you are told to do so.  Arrange for someone to drive you to and from the procedure.  Do not put contact lenses in either eye on the day of your surgery. PROCEDURE There is more than one method for safely removing a cataract. Your doctor can explain the differences and help determine which is best for you. Phacoemulsification surgery is the most common form of cataract surgery.  An injection is given behind the eye or eyedrops are given to make this a painless procedure.  A small cut (incision) is made on the edge of the clear, dome-shaped  surface that covers the front of the eye (cornea).  A tiny probe is painlessly inserted into the eye. This device gives off ultrasound waves that soften and break up the cloudy center of the lens. This makes it easier for the cloudy lens to be removed by suction.  An IOL may be implanted.  The normal lens of the eye is covered by a clear capsule. Part of that capsule is intentionally left in the eye to support the IOL.  Your surgeon may or may not use stitches to close the incision. There are other forms of cataract  surgery that require a larger incision and stiches to close the eye. This approach is taken in cases where the doctor feels that the cataract cannot be easily removed using phacoemulsification. AFTER THE PROCEDURE  When an IOL is implanted, it does not need care. It becomes a permanent part of your eye and cannot be seen or felt.  Your doctor will schedule follow-up exams to check on your progress.  Review your other medicines with your doctor to see which can be resumed after surgery.  Use eyedrops or take medicine as prescribed by your doctor. Document Released: 11/12/2011 Document Revised: 02/15/2012 Document Reviewed: 11/12/2011 [facility] Patient Information 2013 [place].    PATIENT INSTRUCTIONS POST-ANESTHESIA  IMMEDIATELY FOLLOWING SURGERY:  Do not drive or operate machinery for the first twenty four hours after surgery.  Do not make any important decisions for twenty four hours after surgery or while taking narcotic pain medications or sedatives.  If you develop intractable nausea and vomiting or a severe headache please notify your doctor immediately.  FOLLOW-UP:  Please make an appointment with your surgeon as instructed. You do not need to follow up with anesthesia unless specifically instructed to do so.  WOUND CARE INSTRUCTIONS (if applicable):  Keep a dry clean dressing on the anesthesia/puncture wound site if there is drainage.  Once the wound has quit draining you may leave it open to air.  Generally you should leave the bandage intact for twenty four hours unless there is drainage.  If the epidural site drains for more than 36-48 hours please call the anesthesia department.  QUESTIONS?:  Please feel free to call your physician or the hospital operator if you have any questions, and they will be happy to assist you.

## 2014-02-21 ENCOUNTER — Encounter (HOSPITAL_COMMUNITY): Payer: Self-pay

## 2014-02-21 ENCOUNTER — Encounter (HOSPITAL_COMMUNITY)
Admission: RE | Admit: 2014-02-21 | Discharge: 2014-02-21 | Disposition: A | Discharge status: Home / Self Care | Payer: Medicaid Other | Source: Ambulatory Visit | Attending: Ophthalmology | Admitting: Ophthalmology

## 2014-02-21 DIAGNOSIS — Z01812 Encounter for preprocedural laboratory examination: Secondary | ICD-10-CM | POA: Insufficient documentation

## 2014-02-21 LAB — CBC WITH DIFFERENTIAL/PLATELET
Basophils Absolute: 0.1 10*3/uL (ref 0.0–0.1)
Basophils Relative: 1 % (ref 0–1)
EOS ABS: 0.2 10*3/uL (ref 0.0–0.7)
EOS PCT: 4 % (ref 0–5)
HEMATOCRIT: 33.4 % — AB (ref 36.0–46.0)
HEMOGLOBIN: 10.8 g/dL — AB (ref 12.0–15.0)
LYMPHS ABS: 2.2 10*3/uL (ref 0.7–4.0)
LYMPHS PCT: 37 % (ref 12–46)
MCH: 31.3 pg (ref 26.0–34.0)
MCHC: 32.3 g/dL (ref 30.0–36.0)
MCV: 96.8 fL (ref 78.0–100.0)
MONO ABS: 1.1 10*3/uL — AB (ref 0.1–1.0)
MONOS PCT: 18 % — AB (ref 3–12)
Neutro Abs: 2.4 10*3/uL (ref 1.7–7.7)
Neutrophils Relative %: 40 % — ABNORMAL LOW (ref 43–77)
Platelets: 170 10*3/uL (ref 150–400)
RBC: 3.45 MIL/uL — AB (ref 3.87–5.11)
RDW: 13.6 % (ref 11.5–15.5)
WBC: 6 10*3/uL (ref 4.0–10.5)

## 2014-02-21 LAB — BASIC METABOLIC PANEL
BUN: 21 mg/dL (ref 6–23)
CALCIUM: 9.1 mg/dL (ref 8.4–10.5)
CO2: 23 mEq/L (ref 19–32)
Chloride: 106 mEq/L (ref 96–112)
Creatinine, Ser: 1.26 mg/dL — ABNORMAL HIGH (ref 0.50–1.10)
GFR calc Af Amer: 53 mL/min — ABNORMAL LOW (ref >90)
GFR, EST NON AFRICAN AMERICAN: 46 mL/min — AB (ref >90)
GLUCOSE: 318 mg/dL — AB (ref 70–99)
Potassium: 4.6 mEq/L (ref 3.7–5.3)
Sodium: 139 mEq/L (ref 137–147)

## 2014-02-21 NOTE — Progress Notes (Signed)
Pt came in for pre-op interview for cataract surgery with Dr. Gershon Crane this morning. Pt was given verbal and written instructions about surgery, teach back method used. Erlinda Hong, Med Tech, was called at [facility] nursing [facility], Assisted Living) and pre-op instructions including day and time of surgery, medication instructions and npo status were reviewed with her, teach back method used. All questions were answered.

## 2014-02-22 ENCOUNTER — Ambulatory Visit (HOSPITAL_COMMUNITY): Payer: Medicaid Other

## 2014-02-26 MED ORDER — KETOROLAC TROMETHAMINE 0.5 % OP SOLN
OPHTHALMIC | Status: AC
  Filled 2014-02-26: qty 5

## 2014-02-26 MED ORDER — CYCLOPENTOLATE-PHENYLEPHRINE OP SOLN OPTIME - NO CHARGE
OPHTHALMIC | Status: AC
  Filled 2014-02-26: qty 2

## 2014-02-26 MED ORDER — PHENYLEPHRINE HCL 2.5 % OP SOLN
OPHTHALMIC | Status: AC
  Filled 2014-02-26: qty 15

## 2014-02-26 MED ORDER — TETRACAINE HCL 0.5 % OP SOLN
OPHTHALMIC | Status: AC
  Filled 2014-02-26: qty 2

## 2014-02-27 ENCOUNTER — Ambulatory Visit (HOSPITAL_COMMUNITY)
Admission: RE | Admit: 2014-02-27 | Discharge: 2014-02-27 | Disposition: A | Discharge status: Home / Self Care | Payer: Medicaid Other | Source: Ambulatory Visit | Attending: Ophthalmology | Admitting: Ophthalmology

## 2014-02-27 ENCOUNTER — Ambulatory Visit (HOSPITAL_COMMUNITY): Payer: Medicaid Other | Admitting: Anesthesiology

## 2014-02-27 ENCOUNTER — Encounter (HOSPITAL_COMMUNITY)
Admission: RE | Disposition: A | Discharge status: Home / Self Care | Payer: Self-pay | Source: Ambulatory Visit | Attending: Ophthalmology

## 2014-02-27 ENCOUNTER — Encounter (HOSPITAL_COMMUNITY): Payer: Self-pay | Admitting: *Deleted

## 2014-02-27 ENCOUNTER — Encounter (HOSPITAL_COMMUNITY): Payer: Medicaid Other | Admitting: Anesthesiology

## 2014-02-27 DIAGNOSIS — Z7982 Long term (current) use of aspirin: Secondary | ICD-10-CM | POA: Insufficient documentation

## 2014-02-27 DIAGNOSIS — Z794 Long term (current) use of insulin: Secondary | ICD-10-CM | POA: Insufficient documentation

## 2014-02-27 DIAGNOSIS — H2510 Age-related nuclear cataract, unspecified eye: Secondary | ICD-10-CM | POA: Insufficient documentation

## 2014-02-27 DIAGNOSIS — Z01812 Encounter for preprocedural laboratory examination: Secondary | ICD-10-CM | POA: Insufficient documentation

## 2014-02-27 DIAGNOSIS — E119 Type 2 diabetes mellitus without complications: Secondary | ICD-10-CM | POA: Insufficient documentation

## 2014-02-27 DIAGNOSIS — Z87891 Personal history of nicotine dependence: Secondary | ICD-10-CM | POA: Insufficient documentation

## 2014-02-27 HISTORY — PX: CATARACT EXTRACTION W/PHACO: SHX586

## 2014-02-27 LAB — GLUCOSE, CAPILLARY
Glucose-Capillary: 75 mg/dL (ref 70–99)
Glucose-Capillary: 108 mg/dL — ABNORMAL HIGH (ref 70–99)

## 2014-02-27 LAB — SURGICAL PCR SCREEN
MRSA, PCR: POSITIVE — AB
STAPHYLOCOCCUS AUREUS: POSITIVE — AB

## 2014-02-27 SURGERY — PHACOEMULSIFICATION, CATARACT, WITH IOL INSERTION
Anesthesia: Monitor Anesthesia Care | Site: Eye | Laterality: Right

## 2014-02-27 MED ORDER — FENTANYL CITRATE 0.05 MG/ML IJ SOLN
INTRAMUSCULAR | Status: AC
  Filled 2014-02-27: qty 2

## 2014-02-27 MED ORDER — MIDAZOLAM HCL 2 MG/2ML IJ SOLN
INTRAMUSCULAR | Status: AC
  Filled 2014-02-27: qty 2

## 2014-02-27 MED ORDER — TETRACAINE HCL 0.5 % OP SOLN
1.0000 [drp] | OPHTHALMIC | Status: AC
  Administered 2014-02-27 (×3): 1 [drp] via OPHTHALMIC

## 2014-02-27 MED ORDER — DEXTROSE-NACL 5-0.9 % IV SOLN
Freq: Once | INTRAVENOUS | Status: AC
  Administered 2014-02-27: 1000 mL via INTRAVENOUS

## 2014-02-27 MED ORDER — FENTANYL CITRATE 0.05 MG/ML IJ SOLN
25.0000 ug | INTRAMUSCULAR | Status: AC
  Administered 2014-02-27: 25 ug via INTRAVENOUS

## 2014-02-27 MED ORDER — BSS IO SOLN
INTRAOCULAR | Status: DC | PRN
  Administered 2014-02-27: 15 mL via INTRAOCULAR

## 2014-02-27 MED ORDER — EPINEPHRINE HCL 1 MG/ML IJ SOLN
INTRAOCULAR | Status: DC | PRN
  Administered 2014-02-27: 08:00:00

## 2014-02-27 MED ORDER — CYCLOPENTOLATE-PHENYLEPHRINE 0.2-1 % OP SOLN
1.0000 [drp] | OPHTHALMIC | Status: AC
  Administered 2014-02-27 (×3): 1 [drp] via OPHTHALMIC

## 2014-02-27 MED ORDER — DEXTROSE-NACL 5-0.9 % IV SOLN
INTRAVENOUS | Status: DC | PRN
  Administered 2014-02-27: 07:00:00 via INTRAVENOUS

## 2014-02-27 MED ORDER — LIDOCAINE HCL (PF) 1 % IJ SOLN
INTRAMUSCULAR | Status: AC
  Filled 2014-02-27: qty 2

## 2014-02-27 MED ORDER — TETRACAINE 0.5 % OP SOLN OPTIME - NO CHARGE
OPHTHALMIC | Status: DC | PRN
  Administered 2014-02-27: 2 [drp] via OPHTHALMIC

## 2014-02-27 MED ORDER — PHENYLEPHRINE HCL 2.5 % OP SOLN
1.0000 [drp] | OPHTHALMIC | Status: AC
  Administered 2014-02-27 (×3): 1 [drp] via OPHTHALMIC

## 2014-02-27 MED ORDER — KETOROLAC TROMETHAMINE 0.5 % OP SOLN
1.0000 [drp] | OPHTHALMIC | Status: AC
  Administered 2014-02-27 (×3): 1 [drp] via OPHTHALMIC

## 2014-02-27 MED ORDER — MIDAZOLAM HCL 2 MG/2ML IJ SOLN
1.0000 mg | INTRAMUSCULAR | Status: DC | PRN
  Administered 2014-02-27: 2 mg via INTRAVENOUS

## 2014-02-27 MED ORDER — PROVISC 10 MG/ML IO SOLN
INTRAOCULAR | Status: DC | PRN
  Administered 2014-02-27: 0.85 mL via INTRAOCULAR

## 2014-02-27 SURGICAL SUPPLY — 26 items
CAPSULAR TENSION RING-AMO (OPHTHALMIC RELATED) IMPLANT
CLOTH BEACON ORANGE TIMEOUT ST (SAFETY) ×3 IMPLANT
EYE SHIELD UNIVERSAL CLEAR (GAUZE/BANDAGES/DRESSINGS) ×3 IMPLANT
GLOVE BIO SURGEON STRL SZ 6.5 (GLOVE) ×2 IMPLANT
GLOVE BIO SURGEONS STRL SZ 6.5 (GLOVE) ×1
GLOVE BIOGEL PI IND STRL 7.0 (GLOVE) ×1 IMPLANT
GLOVE BIOGEL PI INDICATOR 7.0 (GLOVE) ×2
GLOVE ECLIPSE 6.5 STRL STRAW (GLOVE) IMPLANT
GLOVE ECLIPSE 7.0 STRL STRAW (GLOVE) IMPLANT
GLOVE EXAM NITRILE LRG STRL (GLOVE) IMPLANT
GLOVE EXAM NITRILE MD LF STRL (GLOVE) IMPLANT
GLOVE SKINSENSE NS SZ6.5 (GLOVE)
GLOVE SKINSENSE STRL SZ6.5 (GLOVE) IMPLANT
HEALON 5 0.6 ML (INTRAOCULAR LENS) IMPLANT
KIT VITRECTOMY (OPHTHALMIC RELATED) IMPLANT
PAD ARMBOARD 7.5X6 YLW CONV (MISCELLANEOUS) ×3 IMPLANT
PROC W NO LENS (INTRAOCULAR LENS)
PROC W SPEC LENS (INTRAOCULAR LENS)
PROCESS W NO LENS (INTRAOCULAR LENS) IMPLANT
PROCESS W SPEC LENS (INTRAOCULAR LENS) IMPLANT
RING MALYGIN (MISCELLANEOUS) IMPLANT
SIGHTPATH CAT PROC W REG LENS (Ophthalmic Related) ×3 IMPLANT
TAPE SURG TRANSPORE 1 IN (GAUZE/BANDAGES/DRESSINGS) ×1 IMPLANT
TAPE SURGICAL TRANSPORE 1 IN (GAUZE/BANDAGES/DRESSINGS) ×2
VISCOELASTIC ADDITIONAL (OPHTHALMIC RELATED) IMPLANT
WATER STERILE IRR 250ML POUR (IV SOLUTION) ×3 IMPLANT

## 2014-02-27 NOTE — Preoperative (Signed)
Beta Blockers   Reason not to administer Beta Blockers:last dose this am 0630 02/27/2014

## 2014-02-27 NOTE — Anesthesia Procedure Notes (Signed)
Procedure Name: MAC Date/Time: 02/27/2014 8:06 AM Performed by: Vista Deck Pre-anesthesia Checklist: Patient identified, Emergency Drugs available, Suction available, Timeout performed and Patient being monitored Patient Re-evaluated:Patient Re-evaluated prior to inductionOxygen Delivery Method: Nasal Cannula

## 2014-02-27 NOTE — Op Note (Signed)
Patient brought to the operating room and prepped and draped in the usual manner.  Lid speculum inserted in right eye.  Stab incision made at the twelve o'clock position.  Provisc instilled in the anterior chamber.   A 2.4 mm. Stab incision was made temporally.  An anterior capsulotomy was done with a bent 25 gauge needle.  The nucleus was hydrodissected.  The Phaco tip was inserted in the anterior chamber and the nucleus was emulsified.  CDE was 6.77.  The cortical material was then removed with the I and A tip.  Posterior capsule was the polished.  The anterior chamber was deepened with Provisc.  A 18.0 Diopter Rayner 570C IOL was then inserted in the capsular bag.  Provisc was then removed with the I and A tip.  The wound was then hydrated.  Patient sent to the Recovery Room in good condition with follow up in my office.  Preoperative Diagnosis:  Nuclear Cataract OD Postoperative Diagnosis:  Same Procedure name: Kelman Phacoemulsification OD with IOL

## 2014-02-27 NOTE — Discharge Instructions (Signed)
[name]  02/27/2014           [place] Instructions [place] [address]      1. Avoid closing eyes tightly. One often closes the eye tightly when laughing, talking, sneezing, coughing or if they feel irritated. At these times, you should be careful not to close your eyes tightly.  2. Instill eye drops as instructed. To instill drops in your eye, open it, look up and have someone gently pull the lower lid down and instill a couple of drops inside the lower lid.  3. Do not touch upper lid.  4. Take Advil or Tylenol for pain.  5. You may use either eye for near work, such as reading or sewing and you may watch television.  6. You may have your hair done at the beauty parlor at any time.  7. Wear dark glasses with or without your own glasses if you are in bright light.  8. Call our office at [phone] or [phone] if you have sharp pain in your eye or unusual symptoms.  9. Do not be concerned because vision in the operative eye is not good. It will not be good, no matter how successful the operation, until you get a special lens for it. Your old glasses will not be suited to the new eye that was operated on and you will not be ready for a new lens for about a month.  10. Follow up at the [facility] office.    I have received a copy of the above instructions and will follow them.

## 2014-02-27 NOTE — H&P (Signed)
The patient was re examined and there is no change in the patients condition since the original H and P. 

## 2014-02-27 NOTE — Anesthesia Postprocedure Evaluation (Signed)
  Anesthesia Post-op Note  Patient: [name]  Procedure(s) Performed: Procedure(s) (LRB): CATARACT EXTRACTION PHACO AND INTRAOCULAR LENS PLACEMENT (IOC) (Right)  Patient Location:  Short Stay  Anesthesia Type: MAC  Level of Consciousness: awake  Airway and Oxygen Therapy: Patient Spontanous Breathing  Post-op Pain: none  Post-op Assessment: Post-op Vital signs reviewed, Patient's Cardiovascular Status Stable, Respiratory Function Stable, Patent Airway, No signs of Nausea or vomiting and Pain level controlled  Post-op Vital Signs: Reviewed and stable  Complications: No apparent anesthesia complications

## 2014-02-27 NOTE — Anesthesia Preprocedure Evaluation (Signed)
Anesthesia Evaluation  Patient identified by MRN, date of birth, ID band Patient awake and Patient confused    Reviewed: Allergy & Precautions, H&P , NPO status , Patient's Chart, lab work & pertinent test results  Airway Mallampati: II      Dental  (+) Teeth Intact, Dental Advisory Given   Pulmonary asthma , former smoker,  breath sounds clear to auscultation        Cardiovascular + Peripheral Vascular Disease and +CHF Rhythm:Regular Rate:Normal     Neuro/Psych PSYCHIATRIC DISORDERS Depression TIA   GI/Hepatic GERD-  Medicated and Controlled,(+)     substance abuse   , Hepatitis -, C  Endo/Other  diabetes, Type 2  Renal/GU      Musculoskeletal   Abdominal   Peds  Hematology  (+) anemia ,   Anesthesia Other Findings   Reproductive/Obstetrics                           Anesthesia Physical Anesthesia Plan  ASA: III  Anesthesia Plan: MAC   Post-op Pain Management:    Induction: Intravenous  Airway Management Planned: Nasal Cannula  Additional Equipment:   Intra-op Plan:   Post-operative Plan:   Informed Consent: I have reviewed the patients History and Physical, chart, labs and discussed the procedure including the risks, benefits and alternatives for the proposed anesthesia with the patient or authorized representative who has indicated his/her understanding and acceptance.     Plan Discussed with:   Anesthesia Plan Comments:         Anesthesia Quick Evaluation

## 2014-02-27 NOTE — Transfer of Care (Signed)
Immediate Anesthesia Transfer of Care Note  Patient: [name]  Procedure(s) Performed: Procedure(s) (LRB): CATARACT EXTRACTION PHACO AND INTRAOCULAR LENS PLACEMENT (IOC) (Right)  Patient Location: Shortstay  Anesthesia Type: MAC  Level of Consciousness: awake  Airway & Oxygen Therapy: Patient Spontanous Breathing   Post-op Assessment: Report given to PACU RN, Post -op Vital signs reviewed and stable and Patient moving all extremities  Post vital signs: Reviewed and stable  Complications: No apparent anesthesia complications

## 2014-02-28 ENCOUNTER — Encounter: Payer: Self-pay | Admitting: Infectious Disease

## 2014-02-28 ENCOUNTER — Ambulatory Visit (INDEPENDENT_AMBULATORY_CARE_PROVIDER_SITE_OTHER): Payer: Medicaid Other | Admitting: Infectious Disease

## 2014-02-28 VITALS — BP 123/70 | HR 79 | Temp 97.9°F | Wt 140.0 lb

## 2014-02-28 DIAGNOSIS — B1920 Unspecified viral hepatitis C without hepatic coma: Secondary | ICD-10-CM

## 2014-02-28 DIAGNOSIS — B20 Human immunodeficiency virus [HIV] disease: Secondary | ICD-10-CM

## 2014-02-28 DIAGNOSIS — H269 Unspecified cataract: Secondary | ICD-10-CM

## 2014-02-28 DIAGNOSIS — K7400 Hepatic fibrosis, unspecified: Secondary | ICD-10-CM

## 2014-02-28 DIAGNOSIS — T879 Unspecified complications of amputation stump: Secondary | ICD-10-CM

## 2014-02-28 DIAGNOSIS — F329 Major depressive disorder, single episode, unspecified: Secondary | ICD-10-CM

## 2014-02-28 DIAGNOSIS — E1159 Type 2 diabetes mellitus with other circulatory complications: Secondary | ICD-10-CM

## 2014-02-28 DIAGNOSIS — F3289 Other specified depressive episodes: Secondary | ICD-10-CM

## 2014-02-28 DIAGNOSIS — F32A Depression, unspecified: Secondary | ICD-10-CM

## 2014-02-28 DIAGNOSIS — K7460 Unspecified cirrhosis of liver: Secondary | ICD-10-CM

## 2014-02-28 MED ORDER — LEDIPASVIR-SOFOSBUVIR 90-400 MG PO TABS: 1.0000 | ORAL_TABLET | Freq: Every day | ORAL | Status: DC

## 2014-02-28 NOTE — Progress Notes (Signed)
Subjective:    Patient ID: [name], female    DOB: 10/02/1956, 58 y.o.   MRN: [id number]  HPI   [name] is a 58 y.o. female with HIV infection who has been superbly well controlled on her  antiviral regimen, atripla with undetectable viral load and health cd4 count who has been found to have severe PVD and ultimately underwent Left BKA.   We changed her to Tivicay and Truvada and were attempting to get her a Fibroscan to assess her Hep C but this had not yet been done.  She is c/o pain at her BKA site at last visit but this is now improved.  We did FIBROSURE and this gave her Fibrosis score of F3 clearly MANDATING RX OF HER HEP C  She has had cataract surgery 2 days ago.  Her mood is much improved.    Review of Systems  Constitutional: Negative for activity change, appetite change and unexpected weight change.  HENT: Negative for rhinorrhea, sinus pressure, sneezing and trouble swallowing.   Eyes: Negative for photophobia and visual disturbance.  Respiratory: Negative for chest tightness, shortness of breath, wheezing and stridor.   Cardiovascular: Negative for palpitations and leg swelling.  Gastrointestinal: Negative for diarrhea, constipation, blood in stool, abdominal distention and anal bleeding.  Genitourinary: Negative for dysuria, hematuria, flank pain and difficulty urinating.  Musculoskeletal: Negative for back pain.  Skin: Positive for color change. Negative for pallor and wound.  Neurological: Negative for dizziness, tremors and light-headedness.  Hematological: Negative for adenopathy. Does not bruise/bleed easily.  Psychiatric/Behavioral: Negative for behavioral problems, confusion, sleep disturbance, self-injury, decreased concentration and agitation.       Objective:   Physical Exam  Constitutional: She is oriented to person, place, and time. She appears well-developed and well-nourished. No distress.  HENT:  Head: Normocephalic and atraumatic.    Mouth/Throat: Oropharynx is clear and moist. No oropharyngeal exudate.  Eyes: Conjunctivae and EOM are normal. No scleral icterus.  Neck: Normal range of motion. Neck supple. No JVD present.  Cardiovascular: Normal rate, regular rhythm and normal heart sounds.  Exam reveals no gallop and no friction rub.   No murmur heard. Pulmonary/Chest: Effort normal and breath sounds normal. No respiratory distress. She has no wheezes. She has no rales. She exhibits no tenderness.  Abdominal: She exhibits no distension and no mass. There is no tenderness. There is no rebound and no guarding.  Musculoskeletal: She exhibits no edema.       Legs: Lymphadenopathy:    She has no cervical adenopathy.  Neurological: She is alert and oriented to person, place, and time. She exhibits normal muscle tone. Coordination normal.  Skin: Skin is warm and dry. She is not diaphoretic. No pallor.  Psychiatric: She has a normal mood and affect. Her behavior is normal. Judgment and thought content normal.                Assessment & Plan:   HEP C genotype 1a, VL > 1 million. fibrosure = F3 SCORE  She is NOT active IVDU or other drug use and no interest in either  She is resident of SNF in [facility]  --I have dc her PPI  --I wrote rx for HARVONI 28 days with 2 refills to allow 12 weeks of therapy and sent to [place]   I spent greater than 25 minutes with the patient including greater than 50% of time in face to face counsel of the patient and in  coordination of their care.  HIV: perfectly controlled on atripla, Changd  to [employer] and TRUVADA, SHE ACTUALLY DOES NEED HER HIV LABS rechecked on this new regimen--and will check today  SHE SHOULD NOT TAKE HER TIVICAY AT SAME TIME AS IRON, CALCIUM OR MAG SUPPLEMENTS  Depression: Increased sertraline 200 mg daily and improved  Left AKA site: improved  DM: followup with her endocrinologist

## 2014-03-01 ENCOUNTER — Emergency Department (HOSPITAL_COMMUNITY): Payer: Medicaid Other

## 2014-03-01 ENCOUNTER — Encounter (HOSPITAL_COMMUNITY): Payer: Medicaid Other | Admitting: Pharmacy Technician

## 2014-03-01 ENCOUNTER — Encounter (HOSPITAL_COMMUNITY): Payer: Self-pay | Admitting: Emergency Medicine

## 2014-03-01 ENCOUNTER — Emergency Department (HOSPITAL_COMMUNITY)
Admission: EM | Admit: 2014-03-01 | Discharge: 2014-03-01 | Disposition: A | Discharge status: Home / Self Care | Payer: Medicaid Other | Attending: Emergency Medicine | Admitting: Emergency Medicine

## 2014-03-01 DIAGNOSIS — Z21 Asymptomatic human immunodeficiency virus [HIV] infection status: Secondary | ICD-10-CM | POA: Insufficient documentation

## 2014-03-01 DIAGNOSIS — G8929 Other chronic pain: Secondary | ICD-10-CM | POA: Insufficient documentation

## 2014-03-01 DIAGNOSIS — I499 Cardiac arrhythmia, unspecified: Secondary | ICD-10-CM | POA: Insufficient documentation

## 2014-03-01 DIAGNOSIS — S8990XA Unspecified injury of unspecified lower leg, initial encounter: Secondary | ICD-10-CM | POA: Insufficient documentation

## 2014-03-01 DIAGNOSIS — Z862 Personal history of diseases of the blood and blood-forming organs and certain disorders involving the immune mechanism: Secondary | ICD-10-CM | POA: Insufficient documentation

## 2014-03-01 DIAGNOSIS — S99929A Unspecified injury of unspecified foot, initial encounter: Principal | ICD-10-CM

## 2014-03-01 DIAGNOSIS — J45909 Unspecified asthma, uncomplicated: Secondary | ICD-10-CM | POA: Insufficient documentation

## 2014-03-01 DIAGNOSIS — Y929 Unspecified place or not applicable: Secondary | ICD-10-CM | POA: Insufficient documentation

## 2014-03-01 DIAGNOSIS — Z8659 Personal history of other mental and behavioral disorders: Secondary | ICD-10-CM | POA: Insufficient documentation

## 2014-03-01 DIAGNOSIS — Z8673 Personal history of transient ischemic attack (TIA), and cerebral infarction without residual deficits: Secondary | ICD-10-CM | POA: Insufficient documentation

## 2014-03-01 DIAGNOSIS — K861 Other chronic pancreatitis: Secondary | ICD-10-CM | POA: Insufficient documentation

## 2014-03-01 DIAGNOSIS — Y9301 Activity, walking, marching and hiking: Secondary | ICD-10-CM | POA: Insufficient documentation

## 2014-03-01 DIAGNOSIS — D509 Iron deficiency anemia, unspecified: Secondary | ICD-10-CM | POA: Insufficient documentation

## 2014-03-01 DIAGNOSIS — M25562 Pain in left knee: Secondary | ICD-10-CM

## 2014-03-01 DIAGNOSIS — Z87891 Personal history of nicotine dependence: Secondary | ICD-10-CM | POA: Insufficient documentation

## 2014-03-01 DIAGNOSIS — F3289 Other specified depressive episodes: Secondary | ICD-10-CM | POA: Insufficient documentation

## 2014-03-01 DIAGNOSIS — Z8739 Personal history of other diseases of the musculoskeletal system and connective tissue: Secondary | ICD-10-CM | POA: Insufficient documentation

## 2014-03-01 DIAGNOSIS — IMO0002 Reserved for concepts with insufficient information to code with codable children: Secondary | ICD-10-CM | POA: Insufficient documentation

## 2014-03-01 DIAGNOSIS — E7800 Pure hypercholesterolemia, unspecified: Secondary | ICD-10-CM | POA: Insufficient documentation

## 2014-03-01 DIAGNOSIS — Z8619 Personal history of other infectious and parasitic diseases: Secondary | ICD-10-CM | POA: Insufficient documentation

## 2014-03-01 DIAGNOSIS — Z8669 Personal history of other diseases of the nervous system and sense organs: Secondary | ICD-10-CM | POA: Insufficient documentation

## 2014-03-01 DIAGNOSIS — Z7982 Long term (current) use of aspirin: Secondary | ICD-10-CM | POA: Insufficient documentation

## 2014-03-01 DIAGNOSIS — Z794 Long term (current) use of insulin: Secondary | ICD-10-CM | POA: Insufficient documentation

## 2014-03-01 DIAGNOSIS — W010XXA Fall on same level from slipping, tripping and stumbling without subsequent striking against object, initial encounter: Secondary | ICD-10-CM | POA: Insufficient documentation

## 2014-03-01 DIAGNOSIS — S99919A Unspecified injury of unspecified ankle, initial encounter: Principal | ICD-10-CM

## 2014-03-01 DIAGNOSIS — Z79899 Other long term (current) drug therapy: Secondary | ICD-10-CM | POA: Insufficient documentation

## 2014-03-01 DIAGNOSIS — S73109A Unspecified sprain of unspecified hip, initial encounter: Secondary | ICD-10-CM

## 2014-03-01 DIAGNOSIS — F329 Major depressive disorder, single episode, unspecified: Secondary | ICD-10-CM | POA: Insufficient documentation

## 2014-03-01 DIAGNOSIS — G4700 Insomnia, unspecified: Secondary | ICD-10-CM | POA: Insufficient documentation

## 2014-03-01 DIAGNOSIS — K5900 Constipation, unspecified: Secondary | ICD-10-CM | POA: Insufficient documentation

## 2014-03-01 DIAGNOSIS — Z8679 Personal history of other diseases of the circulatory system: Secondary | ICD-10-CM | POA: Insufficient documentation

## 2014-03-01 DIAGNOSIS — E119 Type 2 diabetes mellitus without complications: Secondary | ICD-10-CM | POA: Insufficient documentation

## 2014-03-01 DIAGNOSIS — K219 Gastro-esophageal reflux disease without esophagitis: Secondary | ICD-10-CM | POA: Insufficient documentation

## 2014-03-01 MED ORDER — OXYCODONE-ACETAMINOPHEN 5-325 MG PO TABS: 1.0000 | ORAL_TABLET | ORAL | Status: DC | PRN

## 2014-03-01 MED ORDER — OXYCODONE-ACETAMINOPHEN 5-325 MG PO TABS
1.0000 | ORAL_TABLET | Freq: Once | ORAL | Status: AC
  Administered 2014-03-01: 1 via ORAL
  Filled 2014-03-01: qty 1

## 2014-03-01 NOTE — ED Provider Notes (Signed)
CSN: [id number]     Arrival date & time 03/01/14  1002 History   First MD Initiated Contact with Patient 03/01/14 1019     Chief Complaint  Patient presents with  . Leg Pain     (Consider location/radiation/quality/duration/timing/severity/associated sxs/prior Treatment) HPI Comments: [name] is a 58 y.o. Female with hx of HIV and left BKA last year, presents to the Emergency Department complaining of pain to her left knee and hip after a fall earlier on the day of ED arrival.  She states that she was wearing her prosthesis and using a walker when she slipped and fell, landing on her left stump and hip.  She denies other injuries or LOC.  She has applied ice without relief, she states the leg has healed well and she denies numbness or pain to the stump prior to the fall.     The history is provided by the patient.    Past Medical History  Diagnosis Date  . Depression   . Neuropathy   . Mood disorder   . TIA (transient ischemic attack)   . Pancreatitis chronic   . Chronic pain   . HIV (human immunodeficiency virus infection) 1990's    takes Norpramin nightly  . Polysubstance abuse   . Cardiomyopathy, nonischemic     EF 45%  . Peripheral vascular disease   . Asthma     "used to when I was young" (02/20/2013)  . Arthritis     "legs and arms" (02/20/2013)  . Hypercholesterolemia     takes Lipitor nightly  . Seasonal allergies     takes Zyrtec daily  . Anemia     takes Iron pill daily  . Nausea     takes Zofran prn  . GERD (gastroesophageal reflux disease)     takes Prilosec daily  . Insomnia     takes Trazodone nightly  . Constipation     takes miralax prn and stool softener daily  . Type II diabetes mellitus     lantus 45units at bedtime  . Dysrhythmia     takes Metoprolol daily  . Weakness of both legs   . Peripheral neuropathy   . Chronic back pain   . Hepatitis C     C  . Urinary frequency   . Nocturia    Past Surgical History  Procedure Laterality  Date  . Carotid endarterectomy Right   . Tubal ligation    . Lower extremity angiogram    . Toe amputation    . Amputation Left 04/28/2013    Procedure: AMPUTATION DIGIT;  Surgeon: Rosetta Posner, MD;  Location: [facility];  Service: Vascular;  Laterality: Left;  left 2nd toe amputation  . Amputation Left 05/10/2013    Procedure: AMPUTATION BELOW KNEE;  Surgeon: Rosetta Posner, MD;  Location: [facility] OR;  Service: Vascular;  Laterality: Left;   Family History  Problem Relation Age of Onset  . Coronary artery disease Father 45   History  Substance Use Topics  . Smoking status: Former Smoker -- 0.25 packs/day for 40 years    Types: Cigarettes    Quit date: 01/12/2013  . Smokeless tobacco: Never Used  . Alcohol Use: No     Comment: 02/20/2013 "used to have problems w/alcohol; haven't drank anything in 4 yr"   OB History   Grav Para Term Preterm Abortions TAB SAB Ect Mult Living   1 1 1       1      Review  of Systems  Constitutional: Negative for fever and chills.  Eyes: Negative for visual disturbance.  Genitourinary: Negative for dysuria and difficulty urinating.  Musculoskeletal: Positive for arthralgias and joint swelling. Negative for back pain, neck pain and neck stiffness.  Skin: Negative for color change and wound.  Neurological: Negative for dizziness, syncope, numbness and headaches.  All other systems reviewed and are negative.      Allergies  Review of patient's allergies indicates no known allergies.  Home Medications   Current Outpatient Rx  Name  Route  Sig  Dispense  Refill  . aspirin EC 81 MG tablet   Oral   Take 81 mg by mouth daily.         Marland Kitchen atorvastatin (LIPITOR) 10 MG tablet   Oral   Take 10 mg by mouth at bedtime.         . cetirizine (ZYRTEC) 10 MG tablet   Oral   Take 10 mg by mouth daily.         Marland Kitchen docusate sodium (COLACE) 100 MG capsule   Oral   Take 100 mg by mouth daily.          . dolutegravir (TIVICAY) 50 MG tablet   Oral   Take 1  tablet (50 mg total) by mouth daily.   30 tablet   11   . emtricitabine-tenofovir (TRUVADA) 200-300 MG per tablet   Oral   Take 1 tablet by mouth daily.   30 tablet   11   . gabapentin (NEURONTIN) 300 MG capsule   Oral   Take 1 capsule (300 mg total) by mouth 3 (three) times daily.   30 capsule   6   . HYDROcodone-acetaminophen (NORCO/VICODIN) 5-325 MG per tablet   Oral   Take 1-2 tablets by mouth every 4 (four) hours as needed for pain (left foot pain). pain   30 tablet   0   . hydrOXYzine (ATARAX/VISTARIL) 25 MG tablet   Oral   Take 1 tablet (25 mg total) by mouth every 6 (six) hours.   12 tablet   0     Take for itching   . insulin aspart (NOVOLOG FLEXPEN) 100 unit/mL SOLN FlexPen   Subcutaneous   Inject 6 Units into the skin 3 (three) times daily with meals. Along with sliding scale. 100-130=1 unit, 131-160=2 units, 161-190=3 units, 191-220=4 units, 221-250=5 units, 251-280=6 units, 281-310=7 units, 311-340=8 units, 341-370=9 units, 371-400=10 units         . insulin glargine (LANTUS) 100 UNIT/ML injection   Subcutaneous   Inject 37 Units into the skin at bedtime.          . Ledipasvir-Sofosbuvir (HARVONI) 90-400 MG TABS   Oral   Take 1 tablet by mouth daily.   28 tablet   2   . LORazepam (ATIVAN) 0.5 MG tablet   Oral   Take 0.5 mg by mouth at bedtime.          . metoprolol succinate (TOPROL-XL) 50 MG 24 hr tablet   Oral   Take 50 mg by mouth daily after breakfast. Take with or immediately following a meal.         . Multiple Vitamin (DAILY VITE) TABS   Oral   Take 1 tablet by mouth daily.         . ondansetron (ZOFRAN) 8 MG tablet   Oral   Take 8 mg by mouth every 8 (eight) hours as needed for nausea.         Marland Kitchen  Pancrelipase, Lip-Prot-Amyl, (CREON) 6000 UNITS CPEP   Oral   Take 6,000-12,000 Units by mouth 3 (three) times daily. Patient takes 2 capsules three times daily with meals and 1 capsule three times daily with snacks         .  polyethylene glycol (MIRALAX) powder   Oral   Take 17 g by mouth daily as needed for mild constipation. constipation         . sertraline (ZOLOFT) 100 MG tablet   Oral   Take 1 tablet (100 mg total) by mouth daily.   30 tablet   11    BP 126/92  Pulse 77  Temp(Src) 98.2 F (36.8 C) (Oral)  Resp 22  Ht 5\' 4"  (1.626 m)  Wt 140 lb (63.504 kg)  BMI 24.02 kg/m2  SpO2 97% Physical Exam  Nursing note and vitals reviewed. Constitutional: She is oriented to person, place, and time. She appears well-developed and well-nourished. No distress.  HENT:  Head: Normocephalic and atraumatic.  Neck: Normal range of motion and full passive range of motion without pain. Neck supple. No spinous process tenderness and no muscular tenderness present. Normal range of motion present.  Cardiovascular: Normal rate, regular rhythm, normal heart sounds and intact distal pulses.   No murmur heard. Pulmonary/Chest: Effort normal and breath sounds normal. No respiratory distress. She exhibits no tenderness.  Musculoskeletal: Normal range of motion. She exhibits tenderness.       Left hip: She exhibits tenderness and bony tenderness. She exhibits normal range of motion, normal strength, no swelling, no crepitus and no deformity.       Legs: Left BKA.  Distal stump appears well healed, no edema excessive warmth or erythema.  ttp of the anterior left knee.  No erythema, effusion, or step-off deformity of the joint.  Patient has mild ttp of the anterior left hip.  Pain reproduced with internal/external rotation.  Pt has full ROM of the hip joint.  Neurological: She is alert and oriented to person, place, and time. She exhibits normal muscle tone. Coordination normal.  Skin: Skin is warm and dry. No erythema.    ED Course  Procedures (including critical care time) Labs Review Labs Reviewed - No data to display Imaging Review Dg Hip Complete Left  03/01/2014   CLINICAL DATA:  Fall with left hip and knee pain.   EXAM: LEFT HIP - COMPLETE 2+ VIEW  COMPARISON:  None.  FINDINGS: No evidence of hip fracture or dislocation. There is generalized osteopenia, with cortical tunneling in the imaged left femoral diaphysis related to longstanding below the knee amputation. Apparent sclerosis along the medial proximal femoral diaphysis is likely related to osteopenia. No focal mass lesion seen when correlated with lateral view. Diffuse arterial calcification.  IMPRESSION: No acute osseous abnormality.   Electronically Signed   By: Jorje Guild M.D.   On: 03/01/2014 11:41   Dg Knee Complete 4 Views Left  03/01/2014   CLINICAL DATA:  Left knee pain after fall.  EXAM: LEFT KNEE - COMPLETE 4+ VIEW  COMPARISON:  None.  FINDINGS: Status post below-knee amputation. Diffuse osteopenia is noted. No acute fracture or dislocation is noted. Joint spaces are intact. Vascular calcifications are noted.  IMPRESSION: No acute abnormality seen in the left knee.   Electronically Signed   By: Sabino Dick M.D.   On: 03/01/2014 11:34     EKG Interpretation None      MDM   Final diagnoses:  Knee pain, left  Sprain of hip  Patient is feeling better, pain improved.  Remains NV intact.  No concerning sx's for cellulitis or compartment syndrome.  Pt currently under treatment with chiropractor.  Has walker.  She agrees to close f/u with her PMD for recheck.  She is well appearing and stable for discharge, agrees to plan  Shylyn Younce L. Pauleen Goleman, PA-C 03/02/14 2025

## 2014-03-01 NOTE — ED Notes (Signed)
  [facility] will send someone to pick Pt up.

## 2014-03-01 NOTE — ED Notes (Signed)
nad noted prior to dc. Dc instructions reviewed and explained. Voiced understanding.  Staff here from [facility] to take back

## 2014-03-01 NOTE — ED Notes (Signed)
Pt reports mechanical fall this morning that resulted in 10/10 pain to her left stump.  Denies head trauma and multiple pain sites.

## 2014-03-01 NOTE — Discharge Instructions (Signed)
Hip Pain The hips join the upper legs to the lower pelvis. The bones, cartilage, tendons, and muscles of the hip joint perform a lot of work each day holding your body weight and allowing you to move around. Hip pain is a common symptom. It can range from a minor ache to severe pain on 1 or both hips. Pain may be felt on the inside of the hip joint near the groin, or the outside near the buttocks and upper thigh. There may be swelling or stiffness as well. It occurs more often when a person walks or performs activity. There are many reasons hip pain can develop. CAUSES  It is important to work with your caregiver to identify the cause since many conditions can impact the bones, cartilage, muscles, and tendons of the hips. Causes for hip pain include:  Broken (fractured) bones.  Separation of the thighbone from the hip socket (dislocation).  Torn cartilage of the hip joint.  Swelling (inflammation) of a tendon (tendonitis), the sac within the hip joint (bursitis), or a joint.  A weakening in the abdominal wall (hernia), affecting the nerves to the hip.  Arthritis in the hip joint or lining of the hip joint.  Pinched nerves in the back, hip, or upper thigh.  A bulging disc in the spine (herniated disc).  Rarely, bone infection or cancer. DIAGNOSIS  The location of your hip pain will help your caregiver understand what may be causing the pain. A diagnosis is based on your medical history, your symptoms, results from your physical exam, and results from diagnostic tests. Diagnostic tests may include X-ray exams, a computerized magnetic scan (magnetic resonance imaging, MRI), or bone scan. TREATMENT  Treatment will depend on the cause of your hip pain. Treatment may include:  Limiting activities and resting until symptoms improve.  Crutches or other walking supports (a cane or brace).  Ice, elevation, and compression.  Physical therapy or home exercises.  Shoe inserts or special  shoes.  Losing weight.  Medications to reduce pain.  Undergoing surgery. HOME CARE INSTRUCTIONS   Only take over-the-counter or prescription medicines for pain, discomfort, or fever as directed by your caregiver.  Put ice on the injured area:  Put ice in a plastic bag.  Place a towel between your skin and the bag.  Leave the ice on for 15-20 minutes at a time, 03-04 times a day.  Keep your leg raised (elevated) when possible to lessen swelling.  Avoid activities that cause pain.  Follow specific exercises as directed by your caregiver.  Sleep with a pillow between your legs on your most comfortable side.  Record how often you have hip pain, the location of the pain, and what it feels like. This information may be helpful to you and your caregiver.  Ask your caregiver about returning to work or sports and whether you should drive.  Follow up with your caregiver for further exams, therapy, or testing as directed. SEEK MEDICAL CARE IF:   Your pain or swelling continues or worsens after 1 week.  You are feeling unwell or have chills.  You have increasing difficulty with walking.  You have a loss of sensation or other new symptoms.  You have questions or concerns. SEEK IMMEDIATE MEDICAL CARE IF:   You cannot put weight on the affected hip.  You have fallen.  You have a sudden increase in pain and swelling in your hip.  You have a fever. MAKE SURE YOU:   Understand these instructions.  Will watch your condition.  Will get help right away if you are not doing well or get worse. Document Released: 05/13/2010 Document Revised: 02/15/2012 Document Reviewed: 05/13/2010 [facility] Patient Information 2014 [place].  Ligament Sprain A ligament sprain is when the bands of tissue that hold bones together (ligament) are stretched. HOME CARE   Rest the injured area.  Start using the joint when told to by your doctor.  Keep the injured area raised (elevated)  above the level of the heart. This may lessen puffiness (swelling).  Put ice on the injured area.  Put ice in a plastic bag.  Place a towel between your skin and the bag.  Leave the ice on for 15-20 minutes, 03-04 times a day.  Wear a splint, cast, or an elastic bandage as told by your doctor.  Only take medicine as told by your doctor.  Use crutches as told by your doctor. Do not put weight on the injured joint until told to by your doctor. GET HELP RIGHT AWAY IF:   You have more bruising, puffiness, or pain.  The leg was injured and the toes are cold, tingling, numb, or blue.  The arm was injured and the fingers are cold, tingling, numb, or blue.  The pain is not helped with medicine.  The pain gets worse. MAKE SURE YOU:   Understand these instructions.  Will watch this condition.  Will get help right away if you are not doing well or get worse. Document Released: 05/11/2008 Document Revised: 09/13/2013 Document Reviewed: 05/11/2008 [facility] Patient Information 2014 [place], [place].  Knee Pain Knee pain can be a result of an injury or other medical conditions. Treatment will depend on the cause of your pain. HOME CARE  Only take medicine as told by your doctor.  Keep a healthy weight. Being overweight can make the knee hurt more.  Stretch before exercising or playing sports.  If there is constant knee pain, change the way you exercise. Ask your doctor for advice.  Make sure shoes fit well. Choose the right shoe for the sport or activity.  Protect your knees. Wear kneepads if needed.  Rest when you are tired. GET HELP RIGHT AWAY IF:   Your knee pain does not stop.  Your knee pain does not get better.  Your knee joint feels hot to the touch.  You have a fever. MAKE SURE YOU:   Understand these instructions.  Will watch this condition.  Will get help right away if you are not doing well or get worse. Document Released: 02/19/2009 Document Revised:  02/15/2012 Document Reviewed: 02/19/2009 [facility] Patient Information 2014 [place], [place].

## 2014-03-01 NOTE — ED Notes (Signed)
BP elevated - cuff changed and reassessed. 137/66

## 2014-03-07 NOTE — ED Provider Notes (Signed)
Medical screening examination/treatment/procedure(s) were performed by non-physician practitioner and as supervising physician I was immediately available for consultation/collaboration.   EKG Interpretation None        [name] L [name], MD 03/07/14 1507 

## 2014-03-13 ENCOUNTER — Encounter (HOSPITAL_COMMUNITY): Payer: Self-pay | Admitting: Pharmacy Technician

## 2014-03-13 ENCOUNTER — Encounter (HOSPITAL_COMMUNITY)
Admission: RE | Admit: 2014-03-13 | Discharge: 2014-03-13 | Disposition: A | Discharge status: Home / Self Care | Payer: Medicaid Other | Source: Ambulatory Visit | Attending: Ophthalmology | Admitting: Ophthalmology

## 2014-03-13 DIAGNOSIS — Z01812 Encounter for preprocedural laboratory examination: Secondary | ICD-10-CM | POA: Insufficient documentation

## 2014-03-13 LAB — SURGICAL PCR SCREEN
MRSA, PCR: POSITIVE — AB
STAPHYLOCOCCUS AUREUS: POSITIVE — AB

## 2014-03-13 NOTE — Patient Instructions (Addendum)
Your procedure is scheduled on:  03/20/2014  Report to [facility] at    63  AM.  Call this number if you have problems the morning of surgery: [phone]   Remember:   Do not eat or drink :After Midnight.    Take these medicines the morning of surgery with A SIP OF WATER: Metoprolol, omeprazole  No insulin am of surgery , take 1/2 dose night before   Do not wear jewelry, make-up or nail polish.  Do not wear lotions, powders, or perfumes. You may wear deodorant.  Do not shave 48 hours prior to surgery.  Do not bring valuables to the hospital.  Contacts, dentures or bridgework may not be worn into surgery.  Patients discharged the day of surgery will not be allowed to drive home.  Name and phone number of your driver:    Please read over the following fact sheets that you were given: Pain Booklet, Surgical Site Infection Prevention, Anesthesia Post-op Instructions and Care and Recovery After Surgery  Cataract Surgery  A cataract is a clouding of the lens of the eye. When a lens becomes cloudy, vision is reduced based on the degree and nature of the clouding. Surgery may be needed to improve vision. Surgery removes the cloudy lens and usually replaces it with a substitute lens (intraocular lens, IOL). LET YOUR EYE DOCTOR KNOW ABOUT:  Allergies to food or medicine.   Medicines taken including herbs, eyedrops, over-the-counter medicines, and creams.   Use of steroids (by mouth or creams).   Previous problems with anesthetics or numbing medicine.   History of bleeding problems or blood clots.   Previous surgery.   Other health problems, including diabetes and kidney problems.   Possibility of pregnancy, if this applies.  RISKS AND COMPLICATIONS  Infection.   Inflammation of the eyeball (endophthalmitis) that can spread to both eyes (sympathetic ophthalmia).   Poor wound healing.   If an IOL is inserted, it can later fall out of proper position. This is very uncommon.    Clouding of the part of your eye that holds an IOL in place. This is called an "after-cataract." These are uncommon, but easily treated.  BEFORE THE PROCEDURE  Do not eat or drink anything except small amounts of water for 8 to 12 before your surgery, or as directed by your caregiver.   Unless you are told otherwise, continue any eyedrops you have been prescribed.   Talk to your primary caregiver about all other medicines that you take (both prescription and non-prescription). In some cases, you may need to stop or change medicines near the time of your surgery. This is most important if you are taking blood-thinning medicine.Do not stop medicines unless you are told to do so.   Arrange for someone to drive you to and from the procedure.   Do not put contact lenses in either eye on the day of your surgery.  PROCEDURE There is more than one method for safely removing a cataract. Your doctor can explain the differences and help determine which is best for you. Phacoemulsification surgery is the most common form of cataract surgery.  An injection is given behind the eye or eyedrops are given to make this a painless procedure.   A small cut (incision) is made on the edge of the clear, dome-shaped surface that covers the front of the eye (cornea).   A tiny probe is painlessly inserted into the eye. This device gives off ultrasound waves that soften and  break up the cloudy center of the lens. This makes it easier for the cloudy lens to be removed by suction.   An IOL may be implanted.   The normal lens of the eye is covered by a clear capsule. Part of that capsule is intentionally left in the eye to support the IOL.   Your surgeon may or may not use stitches to close the incision.  There are other forms of cataract surgery that require a larger incision and stiches to close the eye. This approach is taken in cases where the doctor feels that the cataract cannot be easily removed using  phacoemulsification. AFTER THE PROCEDURE  When an IOL is implanted, it does not need care. It becomes a permanent part of your eye and cannot be seen or felt.   Your doctor will schedule follow-up exams to check on your progress.   Review your other medicines with your doctor to see which can be resumed after surgery.   Use eyedrops or take medicine as prescribed by your doctor.  Document Released: 11/12/2011 Document Reviewed: 11/09/2011 [facility] Patient Information 2012 [place].  .Cataract Surgery Care After Refer to this sheet in the next few weeks. These instructions provide you with information on caring for yourself after your procedure. Your caregiver may also give you more specific instructions. Your treatment has been planned according to current medical practices, but problems sometimes occur. Call your caregiver if you have any problems or questions after your procedure.  HOME CARE INSTRUCTIONS   Avoid strenuous activities as directed by your caregiver.   Ask your caregiver when you can resume driving.   Use eyedrops or other medicines to help healing and control pressure inside your eye as directed by your caregiver.   Only take over-the-counter or prescription medicines for pain, discomfort, or fever as directed by your caregiver.   Do not to touch or rub your eyes.   You may be instructed to use a protective shield during the first few days and nights after surgery. If not, wear sunglasses to protect your eyes. This is to protect the eye from pressure or from being accidentally bumped.   Keep the area around your eye clean and dry. Avoid swimming or allowing water to hit you directly in the face while showering. Keep soap and shampoo out of your eyes.   Do not bend or lift heavy objects. Bending increases pressure in the eye. You can walk, climb stairs, and do light household chores.   Do not put a contact lens into the eye that had surgery until your caregiver  says it is okay to do so.   Ask your doctor when you can return to work. This will depend on the kind of work that you do. If you work in a dusty environment, you may be advised to wear protective eyewear for a period of time.   Ask your caregiver when it will be safe to engage in sexual activity.   Continue with your regular eye exams as directed by your caregiver.  What to expect:  It is normal to feel itching and mild discomfort for a few days after cataract surgery. Some fluid discharge is also common, and your eye may be sensitive to light and touch.   After 1 to 2 days, even moderate discomfort should disappear. In most cases, healing will take about 6 weeks.   If you received an intraocular lens (IOL), you may notice that colors are very bright or have a  blue tinge. Also, if you have been in bright sunlight, everything may appear reddish for a few hours. If you see these color tinges, it is because your lens is clear and no longer cloudy. Within a few months after receiving an IOL, these extra colors should go away. When you have healed, you will probably need new glasses.  SEEK MEDICAL CARE IF:   You have increased bruising around your eye.   You have discomfort not helped by medicine.  SEEK IMMEDIATE MEDICAL CARE IF:   You have a fever.   You have a worsening or sudden vision loss.   You have redness, swelling, or increasing pain in the eye.   You have a thick discharge from the eye that had surgery.  MAKE SURE YOU:  Understand these instructions.   Will watch your condition.   Will get help right away if you are not doing well or get worse.  Document Released: 06/12/2005 Document Revised: 11/12/2011 Document Reviewed: 07/17/2011 [facility] Patient Information 2012 [place].

## 2014-03-19 MED ORDER — KETOROLAC TROMETHAMINE 0.5 % OP SOLN
OPHTHALMIC | Status: AC
  Filled 2014-03-19: qty 5

## 2014-03-19 MED ORDER — PHENYLEPHRINE HCL 2.5 % OP SOLN
OPHTHALMIC | Status: AC
  Filled 2014-03-19: qty 15

## 2014-03-19 MED ORDER — CYCLOPENTOLATE-PHENYLEPHRINE OP SOLN OPTIME - NO CHARGE
OPHTHALMIC | Status: AC
  Filled 2014-03-19: qty 2

## 2014-03-19 MED ORDER — TETRACAINE HCL 0.5 % OP SOLN
OPHTHALMIC | Status: AC
  Filled 2014-03-19: qty 2

## 2014-03-20 ENCOUNTER — Ambulatory Visit (HOSPITAL_COMMUNITY): Payer: Medicaid Other | Admitting: Anesthesiology

## 2014-03-20 ENCOUNTER — Encounter (HOSPITAL_COMMUNITY): Payer: Medicaid Other | Admitting: Anesthesiology

## 2014-03-20 ENCOUNTER — Encounter (HOSPITAL_COMMUNITY): Payer: Self-pay | Admitting: *Deleted

## 2014-03-20 ENCOUNTER — Ambulatory Visit (HOSPITAL_COMMUNITY)
Admission: RE | Admit: 2014-03-20 | Discharge: 2014-03-20 | Disposition: A | Discharge status: Home / Self Care | Payer: Medicaid Other | Source: Ambulatory Visit | Attending: Ophthalmology | Admitting: Ophthalmology

## 2014-03-20 ENCOUNTER — Encounter (HOSPITAL_COMMUNITY)
Admission: RE | Disposition: A | Discharge status: Home / Self Care | Payer: Self-pay | Source: Ambulatory Visit | Attending: Ophthalmology

## 2014-03-20 DIAGNOSIS — Z79899 Other long term (current) drug therapy: Secondary | ICD-10-CM | POA: Insufficient documentation

## 2014-03-20 DIAGNOSIS — F3289 Other specified depressive episodes: Secondary | ICD-10-CM | POA: Insufficient documentation

## 2014-03-20 DIAGNOSIS — E119 Type 2 diabetes mellitus without complications: Secondary | ICD-10-CM | POA: Insufficient documentation

## 2014-03-20 DIAGNOSIS — H2510 Age-related nuclear cataract, unspecified eye: Secondary | ICD-10-CM | POA: Insufficient documentation

## 2014-03-20 DIAGNOSIS — Z794 Long term (current) use of insulin: Secondary | ICD-10-CM | POA: Insufficient documentation

## 2014-03-20 DIAGNOSIS — K219 Gastro-esophageal reflux disease without esophagitis: Secondary | ICD-10-CM | POA: Insufficient documentation

## 2014-03-20 DIAGNOSIS — F329 Major depressive disorder, single episode, unspecified: Secondary | ICD-10-CM | POA: Insufficient documentation

## 2014-03-20 DIAGNOSIS — Z7982 Long term (current) use of aspirin: Secondary | ICD-10-CM | POA: Insufficient documentation

## 2014-03-20 DIAGNOSIS — Z87891 Personal history of nicotine dependence: Secondary | ICD-10-CM | POA: Insufficient documentation

## 2014-03-20 HISTORY — PX: CATARACT EXTRACTION W/PHACO: SHX586

## 2014-03-20 LAB — GLUCOSE, CAPILLARY: GLUCOSE-CAPILLARY: 191 mg/dL — AB (ref 70–99)

## 2014-03-20 SURGERY — PHACOEMULSIFICATION, CATARACT, WITH IOL INSERTION
Anesthesia: Monitor Anesthesia Care | Site: Eye | Laterality: Left

## 2014-03-20 MED ORDER — CYCLOPENTOLATE-PHENYLEPHRINE 0.2-1 % OP SOLN
1.0000 [drp] | OPHTHALMIC | Status: AC
  Administered 2014-03-20 (×3): 1 [drp] via OPHTHALMIC

## 2014-03-20 MED ORDER — LACTATED RINGERS IV SOLN
INTRAVENOUS | Status: DC
  Administered 2014-03-20: 1000 mL via INTRAVENOUS

## 2014-03-20 MED ORDER — PROVISC 10 MG/ML IO SOLN
INTRAOCULAR | Status: DC | PRN
  Administered 2014-03-20: 0.85 mL via INTRAOCULAR

## 2014-03-20 MED ORDER — MIDAZOLAM HCL 2 MG/2ML IJ SOLN
1.0000 mg | INTRAMUSCULAR | Status: DC | PRN
  Administered 2014-03-20: 2 mg via INTRAVENOUS

## 2014-03-20 MED ORDER — TETRACAINE HCL 0.5 % OP SOLN
1.0000 [drp] | OPHTHALMIC | Status: AC
  Administered 2014-03-20 (×3): 1 [drp] via OPHTHALMIC

## 2014-03-20 MED ORDER — BSS IO SOLN
INTRAOCULAR | Status: DC | PRN
  Administered 2014-03-20: 15 mL via INTRAOCULAR

## 2014-03-20 MED ORDER — PHENYLEPHRINE HCL 2.5 % OP SOLN
1.0000 [drp] | OPHTHALMIC | Status: AC
  Administered 2014-03-20 (×3): 1 [drp] via OPHTHALMIC

## 2014-03-20 MED ORDER — MUPIROCIN CALCIUM 2 % EX CREA
TOPICAL_CREAM | Freq: Two times a day (BID) | CUTANEOUS | Status: DC
  Filled 2014-03-20: qty 15

## 2014-03-20 MED ORDER — EPINEPHRINE HCL 1 MG/ML IJ SOLN
INTRAMUSCULAR | Status: DC | PRN
  Administered 2014-03-20: 08:00:00

## 2014-03-20 MED ORDER — FENTANYL CITRATE 0.05 MG/ML IJ SOLN: 25.0000 ug | INTRAMUSCULAR | Status: DC | PRN

## 2014-03-20 MED ORDER — TETRACAINE HCL 0.5 % OP SOLN
OPHTHALMIC | Status: AC
  Filled 2014-03-20: qty 2

## 2014-03-20 MED ORDER — MUPIROCIN 2 % EX OINT
TOPICAL_OINTMENT | CUTANEOUS | Status: AC
  Filled 2014-03-20: qty 22

## 2014-03-20 MED ORDER — MUPIROCIN 2 % EX OINT
TOPICAL_OINTMENT | Freq: Two times a day (BID) | CUTANEOUS | Status: DC
Start: 2014-03-20 — End: 2014-03-20
  Administered 2014-03-20: 1 via NASAL
  Filled 2014-03-20: qty 22

## 2014-03-20 MED ORDER — TETRACAINE 0.5 % OP SOLN OPTIME - NO CHARGE
OPHTHALMIC | Status: DC | PRN
  Administered 2014-03-20: 2 [drp] via OPHTHALMIC

## 2014-03-20 MED ORDER — ONDANSETRON HCL 4 MG/2ML IJ SOLN: 4.0000 mg | Freq: Once | INTRAMUSCULAR | Status: DC | PRN

## 2014-03-20 MED ORDER — MIDAZOLAM HCL 2 MG/2ML IJ SOLN
INTRAMUSCULAR | Status: AC
  Filled 2014-03-20: qty 2

## 2014-03-20 MED ORDER — CARBACHOL 0.01 % IO SOLN
INTRAOCULAR | Status: AC
  Filled 2014-03-20: qty 1.5

## 2014-03-20 MED ORDER — KETOROLAC TROMETHAMINE 0.5 % OP SOLN
1.0000 [drp] | Freq: Once | OPHTHALMIC | Status: AC
  Administered 2014-03-20: 1 [drp] via OPHTHALMIC

## 2014-03-20 MED ORDER — EPINEPHRINE HCL 1 MG/ML IJ SOLN
INTRAMUSCULAR | Status: AC
  Filled 2014-03-20: qty 1

## 2014-03-20 MED ORDER — LIDOCAINE HCL (PF) 1 % IJ SOLN
INTRAMUSCULAR | Status: DC | PRN
  Administered 2014-03-20: .5 mL

## 2014-03-20 MED ORDER — LIDOCAINE HCL (PF) 1 % IJ SOLN
INTRAMUSCULAR | Status: AC
  Filled 2014-03-20: qty 2

## 2014-03-20 SURGICAL SUPPLY — 25 items
CAPSULAR TENSION RING-AMO (OPHTHALMIC RELATED) IMPLANT
CLOTH BEACON ORANGE TIMEOUT ST (SAFETY) ×3 IMPLANT
EYE SHIELD UNIVERSAL CLEAR (GAUZE/BANDAGES/DRESSINGS) ×3 IMPLANT
GLOVE BIO SURGEON STRL SZ 6.5 (GLOVE) ×2 IMPLANT
GLOVE BIO SURGEONS STRL SZ 6.5 (GLOVE) ×1
GLOVE ECLIPSE 6.5 STRL STRAW (GLOVE) IMPLANT
GLOVE ECLIPSE 7.0 STRL STRAW (GLOVE) IMPLANT
GLOVE EXAM NITRILE LRG STRL (GLOVE) IMPLANT
GLOVE EXAM NITRILE MD LF STRL (GLOVE) IMPLANT
GLOVE SKINSENSE NS SZ6.5 (GLOVE)
GLOVE SKINSENSE STRL SZ6.5 (GLOVE) IMPLANT
GLOVE SS N UNI LF 7.0 STRL (GLOVE) ×3 IMPLANT
HEALON 5 0.6 ML (INTRAOCULAR LENS) IMPLANT
KIT VITRECTOMY (OPHTHALMIC RELATED) IMPLANT
PAD ARMBOARD 7.5X6 YLW CONV (MISCELLANEOUS) ×3 IMPLANT
PROC W NO LENS (INTRAOCULAR LENS)
PROC W SPEC LENS (INTRAOCULAR LENS)
PROCESS W NO LENS (INTRAOCULAR LENS) IMPLANT
PROCESS W SPEC LENS (INTRAOCULAR LENS) IMPLANT
RING MALYGIN (MISCELLANEOUS) IMPLANT
SIGHTPATH CAT PROC W REG LENS (Ophthalmic Related) ×3 IMPLANT
TAPE SURG TRANSPORE 1 IN (GAUZE/BANDAGES/DRESSINGS) ×1 IMPLANT
TAPE SURGICAL TRANSPORE 1 IN (GAUZE/BANDAGES/DRESSINGS) ×2
VISCOELASTIC ADDITIONAL (OPHTHALMIC RELATED) IMPLANT
WATER STERILE IRR 250ML POUR (IV SOLUTION) ×3 IMPLANT

## 2014-03-20 NOTE — H&P (Signed)
The patient was re examined and there is no change in the patients condition since the original H and P. 

## 2014-03-20 NOTE — Transfer of Care (Signed)
Immediate Anesthesia Transfer of Care Note  Patient: [name]  Procedure(s) Performed: Procedure(s) with comments: CATARACT EXTRACTION PHACO AND INTRAOCULAR LENS PLACEMENT (IOC) (Left) - CDE:6.98  Patient Location: Short Stay  Anesthesia Type:MAC  Level of Consciousness: awake  Airway & Oxygen Therapy: Patient Spontanous Breathing  Post-op Assessment: Report given to PACU RN  Post vital signs: Reviewed  Complications: No apparent anesthesia complications

## 2014-03-20 NOTE — Anesthesia Postprocedure Evaluation (Signed)
  Anesthesia Post-op Note  Patient: [name]  Procedure(s) Performed: Procedure(s) with comments: CATARACT EXTRACTION PHACO AND INTRAOCULAR LENS PLACEMENT (IOC) (Left) - CDE:6.98  Patient Location: Short Stay  Anesthesia Type:MAC  Level of Consciousness: awake, alert  and oriented  Airway and Oxygen Therapy: Patient Spontanous Breathing  Post-op Pain: none  Post-op Assessment: Post-op Vital signs reviewed  Post-op Vital Signs: Reviewed and stable  Last Vitals:  Filed Vitals:   03/20/14 0715  BP: 121/62  Pulse:   Temp:   Resp: 29    Complications: No apparent anesthesia complications

## 2014-03-20 NOTE — Discharge Instructions (Signed)
[name]  03/20/2014           [place] Instructions [place] [address]      1. Avoid closing eyes tightly. One often closes the eye tightly when laughing, talking, sneezing, coughing or if they feel irritated. At these times, you should be careful not to close your eyes tightly.  2. Instill eye drops as instructed. To instill drops in your eye, open it, look up and have someone gently pull the lower lid down and instill a couple of drops inside the lower lid.  3. Do not touch upper lid.  4. Take Advil or Tylenol for pain.  5. You may use either eye for near work, such as reading or sewing and you may watch television.  6. You may have your hair done at the beauty parlor at any time.  7. Wear dark glasses with or without your own glasses if you are in bright light.  8. Call our office at [phone] or [phone] if you have sharp pain in your eye or unusual symptoms.  9. Do not be concerned because vision in the operative eye is not good. It will not be good, no matter how successful the operation, until you get a special lens for it. Your old glasses will not be suited to the new eye that was operated on and you will not be ready for a new lens for about a month.  10. Follow up at the [facility] office.    I have received a copy of the above instructions and will follow them.

## 2014-03-20 NOTE — Op Note (Signed)
Patient brought to the operating room and prepped and draped in the usual manner. Lid speculum inserted in left eye. Stab incision made at the twelve o'clock position. Provisc instilled in the anterior chamber. A 2.4 mm. Stab incision was made temporally. An anterior capsulotomy was done with a bent 25 gauge needle. The nucleus was hydrodissected. The Phaco tip was inserted in the anterior chamber and the nucleus was emulsified. CDE was 6.98. The cortical material was then removed with the I and A tip. Posterior capsule was the polished. The anterior chamber was deepened with Provisc. A 18.5 Diopter Rayner 570C IOL was then inserted in the capsular bag. Provisc was then removed with the I and A tip. The wound was then hydrated. Patient sent to the Recovery Room in good condition with follow up in my office.  Preoperative Diagnosis: Nuclear Cataract OS  Postoperative Diagnosis: Same  Procedure name: [place] Phacoemulsification OS with IOL

## 2014-03-20 NOTE — Anesthesia Preprocedure Evaluation (Signed)
Anesthesia Evaluation  Patient identified by MRN, date of birth, ID band Patient awake and Patient confused    Reviewed: Allergy & Precautions, H&P , NPO status , Patient's Chart, lab work & pertinent test results  Airway Mallampati: II      Dental  (+) Teeth Intact, Dental Advisory Given   Pulmonary asthma , former smoker,  breath sounds clear to auscultation        Cardiovascular + Peripheral Vascular Disease and +CHF + dysrhythmias Rhythm:Regular Rate:Normal     Neuro/Psych PSYCHIATRIC DISORDERS Depression TIA   GI/Hepatic GERD-  Medicated and Controlled,(+)     substance abuse  , Hepatitis -, C  Endo/Other  diabetes, Type 2  Renal/GU      Musculoskeletal   Abdominal   Peds  Hematology  (+) anemia ,   Anesthesia Other Findings   Reproductive/Obstetrics                           Anesthesia Physical Anesthesia Plan  ASA: III  Anesthesia Plan: MAC   Post-op Pain Management:    Induction: Intravenous  Airway Management Planned: Nasal Cannula  Additional Equipment:   Intra-op Plan:   Post-operative Plan:   Informed Consent: I have reviewed the patients History and Physical, chart, labs and discussed the procedure including the risks, benefits and alternatives for the proposed anesthesia with the patient or authorized representative who has indicated his/her understanding and acceptance.     Plan Discussed with:   Anesthesia Plan Comments:         Anesthesia Quick Evaluation

## 2014-03-20 NOTE — Addendum Note (Signed)
Addendum created 03/20/14 1740 by Ollen Bowl, CRNA   Modules edited: Anesthesia Flowsheet

## 2014-03-21 ENCOUNTER — Encounter (HOSPITAL_COMMUNITY): Payer: Self-pay | Admitting: Ophthalmology

## 2014-03-21 NOTE — Addendum Note (Signed)
Addendum created 03/21/14 1120 by Ollen Bowl, CRNA   Modules edited: Anesthesia Flowsheet

## 2014-03-27 ENCOUNTER — Encounter (HOSPITAL_COMMUNITY): Payer: Self-pay | Admitting: Pharmacist

## 2014-04-18 ENCOUNTER — Telehealth: Payer: Self-pay | Admitting: *Deleted

## 2014-04-18 ENCOUNTER — Encounter: Payer: Self-pay | Admitting: Infectious Disease

## 2014-04-18 ENCOUNTER — Ambulatory Visit (INDEPENDENT_AMBULATORY_CARE_PROVIDER_SITE_OTHER): Payer: Medicaid Other | Admitting: Infectious Disease

## 2014-04-18 VITALS — Ht 64.0 in | Wt 140.0 lb

## 2014-04-18 DIAGNOSIS — B20 Human immunodeficiency virus [HIV] disease: Secondary | ICD-10-CM

## 2014-04-18 DIAGNOSIS — B1920 Unspecified viral hepatitis C without hepatic coma: Secondary | ICD-10-CM

## 2014-04-18 DIAGNOSIS — L659 Nonscarring hair loss, unspecified: Secondary | ICD-10-CM | POA: Insufficient documentation

## 2014-04-18 DIAGNOSIS — E1059 Type 1 diabetes mellitus with other circulatory complications: Secondary | ICD-10-CM

## 2014-04-18 DIAGNOSIS — T879 Unspecified complications of amputation stump: Secondary | ICD-10-CM

## 2014-04-18 MED ORDER — LEDIPASVIR-SOFOSBUVIR 90-400 MG PO TABS: 1.0000 | ORAL_TABLET | Freq: Every day | ORAL | Status: DC

## 2014-04-18 MED ORDER — OFLOXACIN 0.3 % OP SOLN: 1.0000 [drp] | Freq: Four times a day (QID) | OPHTHALMIC | Status: DC

## 2014-04-18 NOTE — Patient Instructions (Signed)
Ok to [place] on the 20th

## 2014-04-18 NOTE — Telephone Encounter (Signed)
[facility] will courier the [place] refill tomorrow 5/14 to the patient's ALF.  RN requested that the [place] do this on the next refill as well to avoid any gaps in therapy.  ALF notified. Landis Gandy, RN

## 2014-04-18 NOTE — Progress Notes (Signed)
Subjective:    Patient ID: [name], female    DOB: [birth year], 58 y.o.   MRN: [id number]  HPI   [name] is a 58 y.o. female with HIV infection who has been superbly well controlled on her  antiviral regimen, atripla with undetectable viral load and health cd4 count who has been found to have severe PVD and ultimately underwent Left BKA.   We changed her to Tivicay and Truvada and were attempting to get her a Fibroscan to assess her Hep C but this had not yet been done.  She is c/o pain at her BKA site at last visit but this is now improved.  We did FIBROSURE and this gave her Fibrosis score of F3 clearly MANDATING RX OF HER HEP C  She has had cataract surgery   We were able to get approval of Harvoni which was sent by courier to [place] home by S. Rozetta Nunnery, Pharm D. We believe she began this on 03/26/14 but is in need of getting her 2nd months worth of medication filled.   She is tolerating the Harvoni without difficulty but is c/o alopecia she states is been going on for several months now. He is concerned that this could be due to one of her medications. Note we did change her to [facility] and Truvada several months ago but I'm not familiar with TIVICAY causing alopecia.   Review of Systems  Constitutional: Negative for activity change, appetite change and unexpected weight change.  HENT: Negative for rhinorrhea, sinus pressure, sneezing and trouble swallowing.   Eyes: Negative for photophobia and visual disturbance.  Respiratory: Negative for chest tightness, shortness of breath, wheezing and stridor.   Cardiovascular: Negative for palpitations and leg swelling.  Gastrointestinal: Negative for diarrhea, constipation, blood in stool, abdominal distention and anal bleeding.  Genitourinary: Negative for dysuria, hematuria, flank pain and difficulty urinating.  Musculoskeletal: Negative for back pain.  Skin: Positive for color change. Negative for pallor and wound.    Neurological: Negative for dizziness, tremors and light-headedness.  Hematological: Negative for adenopathy. Does not bruise/bleed easily.  Psychiatric/Behavioral: Negative for behavioral problems, confusion, sleep disturbance, self-injury, decreased concentration and agitation.       Objective:   Physical Exam  Constitutional: She is oriented to person, place, and time. She appears well-developed and well-nourished. No distress.  HENT:  Head: Normocephalic and atraumatic.  Mouth/Throat: Oropharynx is clear and moist. No oropharyngeal exudate.  Eyes: Conjunctivae and EOM are normal. No scleral icterus.  Neck: Normal range of motion. Neck supple. No JVD present.  Cardiovascular: Normal rate, regular rhythm and normal heart sounds.  Exam reveals no gallop and no friction rub.   No murmur heard. Pulmonary/Chest: Effort normal and breath sounds normal. No respiratory distress. She has no wheezes. She has no rales. She exhibits no tenderness.  Abdominal: She exhibits no distension and no mass. There is no tenderness. There is no rebound and no guarding.  Musculoskeletal: She exhibits no edema.       Legs: Lymphadenopathy:    She has no cervical adenopathy.  Neurological: She is alert and oriented to person, place, and time. She exhibits normal muscle tone. Coordination normal.  Skin: Skin is warm and dry. She is not diaphoretic. No pallor.  Psychiatric: She has a normal mood and affect. Her behavior is normal. Judgment and thought content normal.    Alopecia:        Assessment & Plan:   #1 HEP C genotype 1a, VL >  1 million. fibrosure = F3 SCORE  -- continue  HARVONI 28  And 2nd supply to be very via courier from  [facility] to [place] home. --We'll bring her back on May 20 for hepatitis C viral load check    I spent greater than 40 minutes with the patient including greater than 50% of time in face to face counsel of the patient and in  coordination of their care.   #2 HIV: For now continue TIVICAY and Truvada. We need to investigate cause of her alopecia he believes is due to [facility] will change this to a different agent potentially Isentress as long as there is no cross reactivity for causing alopecia  #3: Alopecia: Check thyroids Hormone review her medications with pharmacy to see if any of them could possibly cause alopecia. I am open to the idea of changing TIVICAY since it was a recent medication started 3 months ago and would correspond with the onset of her alopecia if her story is accurate. I have not seen a side effect of the patient's yet though.  #4  Depression: Increased sertraline 200 mg daily and improved  #5 DM: followup with her endocrinologist

## 2014-04-19 LAB — TSH: TSH: 1.199 u[IU]/mL (ref 0.350–4.500)

## 2014-04-25 ENCOUNTER — Ambulatory Visit: Payer: Medicaid Other | Admitting: Infectious Disease

## 2014-05-16 ENCOUNTER — Telehealth: Payer: Self-pay | Admitting: *Deleted

## 2014-05-16 NOTE — Telephone Encounter (Signed)
Pick up Hep C medication @ [facility].  [facility] will pick up medication this week.  Notified [place]

## 2014-05-30 ENCOUNTER — Ambulatory Visit (INDEPENDENT_AMBULATORY_CARE_PROVIDER_SITE_OTHER): Payer: Medicaid Other | Admitting: Cardiology

## 2014-05-30 VITALS — BP 116/72 | HR 70 | Ht 64.0 in | Wt 138.0 lb

## 2014-05-30 DIAGNOSIS — I658 Occlusion and stenosis of other precerebral arteries: Secondary | ICD-10-CM

## 2014-05-30 DIAGNOSIS — I6529 Occlusion and stenosis of unspecified carotid artery: Secondary | ICD-10-CM

## 2014-05-30 DIAGNOSIS — Z136 Encounter for screening for cardiovascular disorders: Secondary | ICD-10-CM

## 2014-05-30 DIAGNOSIS — E785 Hyperlipidemia, unspecified: Secondary | ICD-10-CM

## 2014-05-30 DIAGNOSIS — R002 Palpitations: Secondary | ICD-10-CM

## 2014-05-30 DIAGNOSIS — I6523 Occlusion and stenosis of bilateral carotid arteries: Secondary | ICD-10-CM

## 2014-05-30 NOTE — Patient Instructions (Signed)
Your physician wants you to follow-up in: 1 year with Dr. Bryna Colander will receive a reminder letter in the mail two months in advance. If you don't receive a letter, please call our office to schedule the follow-up appointment.  Your physician has requested that you have a carotid duplex. This test is an ultrasound of the carotid arteries in your neck. It looks at blood flow through these arteries that supply the brain with blood. Allow one hour for this exam. There are no restrictions or special instructions.  Your physician recommends that you continue on your current medications as directed. Please refer to the Current Medication list given to you today.

## 2014-05-30 NOTE — Progress Notes (Signed)
Clinical Summary [name] is a 58 y.o.female last seen by Dr [name], this is our first visit together. She is seen for the following medical problems.  1. Palpitations - previous holter monitor showed only sinus tach, no significant arrhythmia. - Echo 06/2012 LVEF 45-50% - denies any recent palpitations,   2. Carotid stenosis - last 10/2013 carotid US, RICA 9-39% and LICA 03-00%, severe bilateral subclavian stenosis. She denies any current symptoms  3. Hyperlipidemia - 11/2013 lipid panel TC 125 TG 169 HDL 56 LDL 35 - compliant with statin  Past Medical History  Diagnosis Date  . Depression   . Neuropathy   . Mood disorder   . TIA (transient ischemic attack)   . Pancreatitis chronic   . Chronic pain   . HIV (human immunodeficiency virus infection) 1990's    takes Norpramin nightly  . Polysubstance abuse   . Cardiomyopathy, nonischemic     EF 45%  . Peripheral vascular disease   . Asthma     "used to when I was young" (02/20/2013)  . Arthritis     "legs and arms" (02/20/2013)  . Hypercholesterolemia     takes Lipitor nightly  . Seasonal allergies     takes Zyrtec daily  . Anemia     takes Iron pill daily  . Nausea     takes Zofran prn  . GERD (gastroesophageal reflux disease)     takes Prilosec daily  . Insomnia     takes Trazodone nightly  . Constipation     takes miralax prn and stool softener daily  . Type II diabetes mellitus     lantus 45units at bedtime  . Dysrhythmia     takes Metoprolol daily  . Weakness of both legs   . Peripheral neuropathy   . Chronic back pain   . Hepatitis C     C  . Urinary frequency   . Nocturia      No Known Allergies   Current Outpatient Prescriptions  Medication Sig Dispense Refill  . aspirin EC 81 MG tablet Take 81 mg by mouth daily.      Marland Kitchen atorvastatin (LIPITOR) 10 MG tablet Take 10 mg by mouth at bedtime.      . cetirizine (ZYRTEC) 10 MG tablet Take 10 mg by mouth daily.      Marland Kitchen docusate sodium (COLACE)  100 MG capsule Take 100 mg by mouth daily.       . dolutegravir (TIVICAY) 50 MG tablet Take 1 tablet (50 mg total) by mouth daily.  30 tablet  11  . emtricitabine-tenofovir (TRUVADA) 200-300 MG per tablet Take 1 tablet by mouth daily.  30 tablet  11  . gabapentin (NEURONTIN) 300 MG capsule Take 1 capsule (300 mg total) by mouth 3 (three) times daily.  30 capsule  6  . HYDROcodone-acetaminophen (NORCO/VICODIN) 5-325 MG per tablet Take 1 tablet by mouth every 8 (eight) hours as needed for moderate pain.      . hydrOXYzine (VISTARIL) 25 MG capsule Take 25 mg by mouth every 6 (six) hours as needed for itching.      . insulin aspart (NOVOLOG FLEXPEN) 100 UNIT/ML FlexPen Inject 1-10 Units into the skin 3 (three) times daily with meals. Per sliding scale. 100-130=1 unit; 131-160=2 units; 161-190=3 units; 191-220=4 units; 221-250=5 units; 251-280= 6 units; 281-310= 7 units; 311-340= 8 units; 341-370= 9 units; 371-400= 10 units.      . insulin aspart (NOVOLOG FLEXPEN) 100 UNIT/ML FlexPen Inject 6  Units into the skin 3 (three) times daily with meals. In addition with sliding scale.      . insulin glargine (LANTUS) 100 UNIT/ML injection Inject 37 Units into the skin at bedtime.       . Ledipasvir-Sofosbuvir (HARVONI) 90-400 MG TABS Take 1 tablet by mouth daily.  30 tablet  2  . LORazepam (ATIVAN) 0.5 MG tablet Take 0.5 mg by mouth at bedtime.       Marland Kitchen LORazepam (ATIVAN) 0.5 MG tablet Take 0.5 mg by mouth daily as needed for anxiety. each morning as needed.      . metoprolol succinate (TOPROL-XL) 50 MG 24 hr tablet Take 50 mg by mouth daily after breakfast. Take with or immediately following a meal.      . Multiple Vitamin (DAILY VITE) TABS Take 1 tablet by mouth daily.      Marland Kitchen ofloxacin (OCUFLOX) 0.3 % ophthalmic solution Place 1 drop into both eyes 4 (four) times daily.  5 mL  0  . ondansetron (ZOFRAN) 8 MG tablet Take by mouth every 8 (eight) hours as needed for nausea or vomiting.      . Pancrelipase,  Lip-Prot-Amyl, (CREON) 6000 UNITS CPEP Take 6,000-12,000 Units by mouth 3 (three) times daily. Patient takes 2 capsules three times daily with meals and 1 capsule three times daily with snacks      . polyethylene glycol (MIRALAX / GLYCOLAX) packet Take 17 g by mouth daily as needed for mild constipation.      . sertraline (ZOLOFT) 100 MG tablet Take 1 tablet (100 mg total) by mouth daily.  30 tablet  11   No current facility-administered medications for this visit.     Past Surgical History  Procedure Laterality Date  . Carotid endarterectomy Right   . Tubal ligation    . Lower extremity angiogram    . Toe amputation    . Amputation Left 04/28/2013    Procedure: AMPUTATION DIGIT;  Surgeon: Jasmine Posner, MD;  Location: [place];  Service: Vascular;  Laterality: Left;  left 2nd toe amputation  . Amputation Left 05/10/2013    Procedure: AMPUTATION BELOW KNEE;  Surgeon: Jasmine Posner, MD;  Location: [place];  Service: Vascular;  Laterality: Left;  . Cataract extraction w/phaco Right 02/27/2014    Procedure: CATARACT EXTRACTION PHACO AND INTRAOCULAR LENS PLACEMENT (IOC);  Surgeon: Jasmine Guadeloupe T. Gershon Crane, MD;  Location: [facility];  Service: Ophthalmology;  Laterality: Right;  CDE:  6.77  . Cataract extraction w/phaco Left 03/20/2014    Procedure: CATARACT EXTRACTION PHACO AND INTRAOCULAR LENS PLACEMENT (IOC);  Surgeon: Jasmine Guadeloupe T. Gershon Crane, MD;  Location: [facility];  Service: Ophthalmology;  Laterality: Left;  CDE:6.98     No Known Allergies    Family History  Problem Relation Age of Onset  . Coronary artery disease Father 66     Social History [name] reports that she has been smoking Cigarettes.  She has a 10 pack-year smoking history. She has never used smokeless tobacco. [name] reports that she does not drink alcohol.   Review of Systems CONSTITUTIONAL: No weight loss, fever, chills, weakness or fatigue.  HEENT: Eyes: No visual loss, blurred vision, double vision or yellow sclerae.No hearing loss,  sneezing, congestion, runny nose or sore throat.  SKIN: No rash or itching.  CARDIOVASCULAR: per HPI RESPIRATORY: No shortness of breath, cough or sputum.  GASTROINTESTINAL: No anorexia, nausea, vomiting or diarrhea. No abdominal pain or blood.  GENITOURINARY: No burning on urination, no polyuria NEUROLOGICAL: No  headache, dizziness, syncope, paralysis, ataxia, numbness or tingling in the extremities. No change in bowel or bladder control.  MUSCULOSKELETAL: No muscle, back pain, joint pain or stiffness.  LYMPHATICS: No enlarged nodes. No history of splenectomy.  PSYCHIATRIC: No history of depression or anxiety.  ENDOCRINOLOGIC: No reports of sweating, cold or heat intolerance. No polyuria or polydipsia.  Marland Kitchen   Physical Examination p 70 bp 116/72 Wt 138 lbs BMI 24 Gen: resting comfortably, no acute distress HEENT: no scleral icterus, pupils equal round and reactive, no palptable cervical adenopathy,  CV: RRR, no m/r/g, no JVD Resp: Clear to auscultation bilaterally GI: abdomen is soft, non-tender, non-distended, normal bowel sounds, no hepatosplenomegaly MSK: extremities are warm, no edema.  Skin: warm, no rash Neuro:  no focal deficits Psych: appropriate affect   Diagnostic Studies 06/2012 Echo Study Conclusions  - Left ventricle: The cavity size was mildly dilated. Wall thickness was increased in a pattern of mild LVH. Systolic function was mildly reduced. The estimated ejection fraction was in the range of 45% to 50%. Diffuse hypokinesis. - Aortic valve: Valve area: 2.69cm^2(VTI). Valve area: 2.16cm^2 (Vmax). - Mitral valve: Mild regurgitation. - Left atrium: The atrium was mildly dilated. - Atrial septum: No defect or patent foramen ovale was identified.  06/2012 Carotid US RICA 8-85%, LICA 02-77%, patent right CEA with Dacron patch  10/2013 Carotid US RICA 4-12%, LICA 87-86%, patent right CEA, severe bilateral subclavian stenosis, patent and antegrade  vertebral  Assessment and Plan  1. Palpitations - no current symptoms, continue to follow  2. Carotid stenosis - repeat carotid US, denies any current symptoms  3. Hyperlipidemia - lipid at goal, continue current statin   F/u 1 year   Arnoldo Lenis, M.D., F.A.C.C.

## 2014-06-11 ENCOUNTER — Other Ambulatory Visit: Payer: Self-pay | Admitting: Infectious Disease

## 2014-06-15 ENCOUNTER — Encounter: Payer: Self-pay | Admitting: Family

## 2014-06-15 ENCOUNTER — Other Ambulatory Visit: Payer: Self-pay

## 2014-06-15 DIAGNOSIS — M79604 Pain in right leg: Secondary | ICD-10-CM

## 2014-06-18 ENCOUNTER — Encounter: Payer: Self-pay | Admitting: Family

## 2014-06-19 ENCOUNTER — Ambulatory Visit (INDEPENDENT_AMBULATORY_CARE_PROVIDER_SITE_OTHER): Payer: Medicaid Other | Admitting: Family

## 2014-06-19 ENCOUNTER — Ambulatory Visit (HOSPITAL_COMMUNITY)
Admission: RE | Admit: 2014-06-19 | Discharge: 2014-06-19 | Disposition: A | Discharge status: Home / Self Care | Payer: Medicaid Other | Source: Ambulatory Visit | Attending: Family | Admitting: Family

## 2014-06-19 ENCOUNTER — Encounter: Payer: Self-pay | Admitting: Family

## 2014-06-19 VITALS — BP 113/63 | HR 76 | Resp 14 | Ht 64.0 in | Wt 130.0 lb

## 2014-06-19 DIAGNOSIS — M79604 Pain in right leg: Secondary | ICD-10-CM

## 2014-06-19 DIAGNOSIS — M79609 Pain in unspecified limb: Secondary | ICD-10-CM

## 2014-06-19 NOTE — Patient Instructions (Signed)

## 2014-06-19 NOTE — Progress Notes (Signed)
[facility] [place] HISTORY AND PHYSICAL -PAD  History of Present Illness [name] is a 58 y.o. female patient of Dr. 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 She was fitted with a prosthetic. She has followup scheduled at [facility] on the 18th of September. She is not using her left LE prosthesis as she falls using it.  At her September, 2014 appointment, venous Duplex demonstrated no evidence of DVT. She had left below-knee amputation stump pain possibly neuropathic versus irritation from her new prosthetic.  At that time the patient was started on Neurontin 300 mg 3 times a day. She was to keep her appointment with [facility] on August 24, 2013. She returns today with complaint of right foot and leg pain for the last couple of months, denies any known injury. She is a resident of [facility]. She denies aggravating or alleviating factors, has been taking analgesics which she states are not helping. She denies non healing wounds, she does not walk, gets around in a wheelchair, uses arms to propel w/c, not her right leg. Dr. Percival Spanish has been checking her carotid arteries, requesting arterial Duplex; she has a history of TIA.  The patient denies New Medical or Surgical History.  Pt Diabetic: Yes, states in good control Pt smoker: smoker  (3 cigarettes/day was 2 ppd, started at age 62 yrs)  Pt meds include: Statin :Yes ASA: Yes Other anticoagulants/antiplatelets: no  Past Medical History  Diagnosis Date  . Depression   . Neuropathy   . Mood disorder   . TIA (transient ischemic attack)   . Pancreatitis chronic   . Chronic pain   . HIV (human immunodeficiency virus infection) 1990's    takes Norpramin nightly  . Polysubstance abuse   . Cardiomyopathy, nonischemic     EF 45%  . Peripheral vascular disease   . Asthma     "used to when I was young" (02/20/2013)  . Arthritis     "legs and arms" (02/20/2013)  .  Hypercholesterolemia     takes Lipitor nightly  . Seasonal allergies     takes Zyrtec daily  . Anemia     takes Iron pill daily  . Nausea     takes Zofran prn  . GERD (gastroesophageal reflux disease)     takes Prilosec daily  . Insomnia     takes Trazodone nightly  . Constipation     takes miralax prn and stool softener daily  . Type II diabetes mellitus     lantus 45units at bedtime  . Dysrhythmia     takes Metoprolol daily  . Weakness of both legs   . Peripheral neuropathy   . Chronic back pain   . Hepatitis C     C  . Urinary frequency   . Nocturia     Social History History  Substance Use Topics  . Smoking status: Current Every Day Smoker -- 0.25 packs/day for 40 years    Types: Cigarettes    Last Attempt to Quit: 01/12/2013  . Smokeless tobacco: Never Used  . Alcohol Use: No     Comment: 02/20/2013 "used to have problems w/alcohol; haven't drank anything in 4 yr"    Family History Family History  Problem Relation Age of Onset  . Coronary artery disease Father 40    Past Surgical History  Procedure Laterality Date  . Carotid endarterectomy Right   . Tubal ligation    . Lower  extremity angiogram    . Toe amputation    . Amputation Left 04/28/2013    Procedure: AMPUTATION DIGIT;  Surgeon: Rosetta Posner, MD;  Location: [place];  Service: Vascular;  Laterality: Left;  left 2nd toe amputation  . Amputation Left 05/10/2013    Procedure: AMPUTATION BELOW KNEE;  Surgeon: Rosetta Posner, MD;  Location: [place];  Service: Vascular;  Laterality: Left;  . Cataract extraction w/phaco Right 02/27/2014    Procedure: CATARACT EXTRACTION PHACO AND INTRAOCULAR LENS PLACEMENT (IOC);  Surgeon: Elta Guadeloupe T. Gershon Crane, MD;  Location[facility];  Service: Ophthalmology;  Laterality: Right;  CDE:  6.77  . Cataract extraction w/phaco Left 03/20/2014    Procedure: CATARACT EXTRACTION PHACO AND INTRAOCULAR LENS PLACEMENT (IOC);  Surgeon: Elta Guadeloupe T. Gershon Crane, MD;  Location: [facility];  Service: Ophthalmology;   Laterality: Left;  CDE:6.98    No Known Allergies  Current Outpatient Prescriptions  Medication Sig Dispense Refill  . aspirin EC 81 MG tablet Take 81 mg by mouth daily.      Marland Kitchen atorvastatin (LIPITOR) 10 MG tablet Take 10 mg by mouth at bedtime.      . cetirizine (ZYRTEC) 10 MG tablet Take 10 mg by mouth daily.      Marland Kitchen docusate sodium (COLACE) 100 MG capsule Take 100 mg by mouth daily.       . dolutegravir (TIVICAY) 50 MG tablet Take 1 tablet (50 mg total) by mouth daily.  30 tablet  11  . emtricitabine-tenofovir (TRUVADA) 200-300 MG per tablet Take 1 tablet by mouth daily.  30 tablet  11  . gabapentin (NEURONTIN) 300 MG capsule Take 1 capsule (300 mg total) by mouth 3 (three) times daily.  30 capsule  6  . HYDROcodone-acetaminophen (NORCO/VICODIN) 5-325 MG per tablet Take 1 tablet by mouth every 8 (eight) hours as needed for moderate pain.      . hydrOXYzine (VISTARIL) 25 MG capsule Take 25 mg by mouth every 6 (six) hours as needed for itching.      . insulin aspart (NOVOLOG FLEXPEN) 100 UNIT/ML FlexPen Inject 1-10 Units into the skin 3 (three) times daily with meals. Per sliding scale. 100-130=1 unit; 131-160=2 units; 161-190=3 units; 191-220=4 units; 221-250=5 units; 251-280= 6 units; 281-310= 7 units; 311-340= 8 units; 341-370= 9 units; 371-400= 10 units.      . insulin aspart (NOVOLOG FLEXPEN) 100 UNIT/ML FlexPen Inject 6 Units into the skin 3 (three) times daily with meals. In addition with sliding scale.      . insulin glargine (LANTUS) 100 UNIT/ML injection Inject 37 Units into the skin at bedtime.       . Ledipasvir-Sofosbuvir (HARVONI) 90-400 MG TABS Take 1 tablet by mouth daily.  30 tablet  2  . lisinopril (PRINIVIL,ZESTRIL) 5 MG tablet Take 5 mg by mouth daily.      Marland Kitchen LORazepam (ATIVAN) 0.5 MG tablet Take 0.5 mg by mouth daily as needed for anxiety. each morning as needed.      . metoprolol succinate (TOPROL-XL) 50 MG 24 hr tablet Take 50 mg by mouth daily after breakfast. Take with  or immediately following a meal.      . Multiple Vitamin (DAILY VITE) TABS Take 1 tablet by mouth daily.      Marland Kitchen ofloxacin (OCUFLOX) 0.3 % ophthalmic solution Place 1 drop into both eyes 4 (four) times daily.  5 mL  0  . ondansetron (ZOFRAN) 8 MG tablet Take by mouth every 8 (eight) hours as needed for nausea or  vomiting.      . Pancrelipase, Lip-Prot-Amyl, (CREON) 6000 UNITS CPEP Take 6,000-12,000 Units by mouth 3 (three) times daily. Patient takes 2 capsules three times daily with meals and 1 capsule three times daily with snacks      . polyethylene glycol (MIRALAX / GLYCOLAX) packet Take 17 g by mouth daily as needed for mild constipation.       No current facility-administered medications for this visit.    ROS: See HPI for pertinent positives and negatives.   Physical Examination  Filed Vitals:   06/19/14 1607  BP: 113/63  Pulse: 76  Resp: 14  Height: 5\' 4"  (1.626 m)  Weight: 130 lb (58.968 kg)   Body mass index is 22.3 kg/(m^2).  General: A&O x 3, WDWN. Gait: she is on a stretcher, otherwise uses her wheelchair Eyes: Pupils equal Pulmonary: Respirations are non labored. Cardiac: regular Rythm , without detected murmur.         Carotid Bruits Left Right   Negative Negative  Aorta is not palpable. Radial pulses: 1+ palpable and =                           VASCULAR EXAM: Extremities : right foot is cool, slightly dusky,  without Gangrene; without open wounds.                                                                                                          LE Pulses LEFT RIGHT       FEMORAL  not palpable  not palpable        POPLITEAL  not palpable   not palpable       POSTERIOR TIBIAL BKA  non-Dopplerable        DORSALIS PEDIS      ANTERIOR TIBIAL BKA  non-Dopplerable   Abdomen: soft, NT, no masses. Skin: no rashes, no ulcers noted. Musculoskeletal: left BKA.  Neurologic: A&O X 3; Appropriate Affect ; SENSATION: hypersensitive to touch at left foot and  lower leg; MOTOR FUNCTION:  moving all extremities equally, motor strength 2/5 in right LE, 4/5 in LLE, RUE, LUE. Speech is fluent/normal. CN 2-12 intact.    Non-Invasive Vascular Imaging: DATE: 06/19/2014 ABI: RIGHT 0.62, Waveforms: monophasic; TBI: 0.62 with monophasic waveforms;  LEFT BKA   ASSESSMENT: Lexani Corona Kissler is a 58 y.o. 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 She returns today with complaint of right foot and leg pain for the last couple of months, denies any known injury. Her right lower leg and foot are painful to light touch, is cool to touch, no lesions, no gangrene. She does not walk. Right ABI and TBI are 0.62, ABI may be falsely elevated. Unfortunately she continues to smoke, but at a reduced rate: she reports 3 cigarettes/ day instead of 2 ppd. Another atherosclerotic risk factor is her insulin dependent DM.   PLAN:  I discussed in depth with the patient the nature of atherosclerosis, and emphasized the importance of maximal medical management including strict  control of blood pressure, blood glucose, and lipid levels, obtaining regular exercise, and cessation of smoking.  The patient is aware that without maximal medical management the underlying atherosclerotic disease process will progress, limiting the benefit of any interventions.  Based on the patient's vascular studies and examination, pt will return to clinic in one week and follow up with Dr. Oneida Alar.  The patient was given information about PAD including signs, symptoms, treatment, what symptoms should prompt the patient to seek immediate medical care, and risk reduction measures to take.  Clemon Chambers, RN, MSN, FNP-C [facility] Phone: [phone]  Clinic MD: Scot Dock on call  06/19/2014 2:12 PM

## 2014-06-25 ENCOUNTER — Encounter: Payer: Self-pay | Admitting: Vascular Surgery

## 2014-06-26 ENCOUNTER — Encounter: Payer: Self-pay | Admitting: Vascular Surgery

## 2014-06-26 ENCOUNTER — Ambulatory Visit (INDEPENDENT_AMBULATORY_CARE_PROVIDER_SITE_OTHER): Payer: Medicaid Other | Admitting: Vascular Surgery

## 2014-06-26 ENCOUNTER — Other Ambulatory Visit: Payer: Self-pay | Admitting: *Deleted

## 2014-06-26 ENCOUNTER — Encounter: Payer: Self-pay | Admitting: *Deleted

## 2014-06-26 VITALS — BP 79/60 | HR 66 | Ht 64.0 in | Wt 130.0 lb

## 2014-06-26 DIAGNOSIS — M79609 Pain in unspecified limb: Secondary | ICD-10-CM

## 2014-06-26 DIAGNOSIS — M79604 Pain in right leg: Secondary | ICD-10-CM

## 2014-06-26 NOTE — Progress Notes (Signed)
Brief History and Physical  History of Present Illness  [name] is a 58 y.o. female who presents with chief complaint: right leg pain at rest that is getting progressively worse with time. This has been going on for approximately 2 months She was seen by her nurse practitioner approximately one week ago and returns today for consideration of arteriogram. The patient presents today for review of ABI's in clinical condition.  She has a history of PAD that led to left below knee amputation in 2014 by Dr. Donnetta Hutching.  Past medical history includes 2 ppd smoking since the age of 32.  She is a diabetic treated with insulin, no known kidney disease noted her last CR was 1.62 prior labs have all been normal.  Her last aortogram was 3/14 by Dr. Trula Slade.    The right common femoral artery is widely patent. The right profunda and superficial femoral arteries are widely patent. The right popliteal artery is patent without significant stenosis. The popliteal artery behind the knee has a high-grade, greater than 90% stenosis. The anterior tibial artery is occluded just distal to its origin with reconstitution at the ankle. There is greater than 80% stenosis within the tibioperoneal trunk. There is ostial stenosis of the posterior tibial artery. This is the dominant runoff across the ankle. The peroneal artery is also patent at the ankle but somewhat diminutive. She has been ambulatory but not over the last couple of months due to pain in the right foot and leg. She has no ulcerations or open wound on the foot. She has multiple chronic medical problems including neuropathy, HIV, cardiomyopathy, diabetes, and hepatitis C. All of which are currently stable. She currently resides in a skilled nursing facility. She does have a history of renal insufficiency.  The patient denies New Medical or Surgical History.  Pt Diabetic: Yes, states in good control  Pt smoker: smoker (3 cigarettes/day was 2 ppd, started at age 24  yrs)   Pt meds include:  Statin :Yes  ASA: Yes  Other anticoagulants/antiplatelets: no   Past Medical History  Diagnosis Date  . Depression   . Neuropathy   . Mood disorder   . TIA (transient ischemic attack)   . Pancreatitis chronic   . Chronic pain   . HIV (human immunodeficiency virus infection) 1990's    takes Norpramin nightly  . Polysubstance abuse   . Cardiomyopathy, nonischemic     EF 45%  . Peripheral vascular disease   . Asthma     "used to when I was young" (02/20/2013)  . Arthritis     "legs and arms" (02/20/2013)  . Hypercholesterolemia     takes Lipitor nightly  . Seasonal allergies     takes Zyrtec daily  . Anemia     takes Iron pill daily  . Nausea     takes Zofran prn  . GERD (gastroesophageal reflux disease)     takes Prilosec daily  . Insomnia     takes Trazodone nightly  . Constipation     takes miralax prn and stool softener daily  . Type II diabetes mellitus     lantus 45units at bedtime  . Dysrhythmia     takes Metoprolol daily  . Weakness of both legs   . Peripheral neuropathy   . Chronic back pain   . Hepatitis C     C  . Urinary frequency   . Nocturia     Past Surgical History  Procedure Laterality Date  .  Carotid endarterectomy Right   . Tubal ligation    . Lower extremity angiogram    . Toe amputation    . Amputation Left 04/28/2013    Procedure: AMPUTATION DIGIT;  Surgeon: Rosetta Posner, MD;  Location: [facility];  Service: Vascular;  Laterality: Left;  left 2nd toe amputation  . Amputation Left 05/10/2013    Procedure: AMPUTATION BELOW KNEE;  Surgeon: Rosetta Posner, MD;  Location: [facility];  Service: Vascular;  Laterality: Left;  . Cataract extraction w/phaco Right 02/27/2014    Procedure: CATARACT EXTRACTION PHACO AND INTRAOCULAR LENS PLACEMENT (IOC);  Surgeon: Elta Guadeloupe T. Gershon Crane, MD;  Location: [facility];  Service: Ophthalmology;  Laterality: Right;  CDE:  6.77  . Cataract extraction w/phaco Left 03/20/2014    Procedure: CATARACT EXTRACTION  PHACO AND INTRAOCULAR LENS PLACEMENT (IOC);  Surgeon: Elta Guadeloupe T. Gershon Crane, MD;  Location: [facility];  Service: Ophthalmology;  Laterality: Left;  CDE:6.98    History   Social History  . Marital Status: Widowed    Spouse Name: N/A    Number of Children: N/A  . Years of Education: 12th grade   Occupational History  .     Social History Main Topics  . Smoking status: Current Every Day Smoker -- 0.25 packs/day for 40 years    Types: Cigarettes    Last Attempt to Quit: 01/12/2013  . Smokeless tobacco: Never Used     Comment: pt states she quits yesterday and is not going to smoke if she can help it  . Alcohol Use: No     Comment: 02/20/2013 "used to have problems w/alcohol; haven't drank anything in 4 yr"  . Drug Use: No     Comment: 02/20/2013 "none in about 4 yr"  . Sexual Activity: No     Comment: pt. declined condoms   Other Topics Concern  . Not on file   Social History Narrative   Alcohol use- denies currently    Hx of cocaine use - pt denies current illicit drug use   Lives in ALF    Family History  Problem Relation Age of Onset  . Coronary artery disease Father 86    Current Outpatient Prescriptions on File Prior to Visit  Medication Sig Dispense Refill  . aspirin EC 81 MG tablet Take 81 mg by mouth daily.      Marland Kitchen atorvastatin (LIPITOR) 10 MG tablet Take 10 mg by mouth at bedtime.      . cetirizine (ZYRTEC) 10 MG tablet Take 10 mg by mouth daily.      Marland Kitchen docusate sodium (COLACE) 100 MG capsule Take 100 mg by mouth daily.       . dolutegravir (TIVICAY) 50 MG tablet Take 1 tablet (50 mg total) by mouth daily.  30 tablet  11  . emtricitabine-tenofovir (TRUVADA) 200-300 MG per tablet Take 1 tablet by mouth daily.  30 tablet  11  . gabapentin (NEURONTIN) 300 MG capsule Take 1 capsule (300 mg total) by mouth 3 (three) times daily.  30 capsule  6  . HYDROcodone-acetaminophen (NORCO/VICODIN) 5-325 MG per tablet Take 1 tablet by mouth every 8 (eight) hours as needed for moderate  pain.      . hydrOXYzine (VISTARIL) 25 MG capsule Take 25 mg by mouth every 6 (six) hours as needed for itching.      . insulin aspart (NOVOLOG FLEXPEN) 100 UNIT/ML FlexPen Inject 1-10 Units into the skin 3 (three) times daily with meals. Per sliding scale. 100-130=1 unit; 131-160=2 units;  161-190=3 units; 191-220=4 units; 221-250=5 units; 251-280= 6 units; 281-310= 7 units; 311-340= 8 units; 341-370= 9 units; 371-400= 10 units.      . insulin aspart (NOVOLOG FLEXPEN) 100 UNIT/ML FlexPen Inject 6 Units into the skin 3 (three) times daily with meals. In addition with sliding scale.      . insulin glargine (LANTUS) 100 UNIT/ML injection Inject 37 Units into the skin at bedtime.       . Ledipasvir-Sofosbuvir (HARVONI) 90-400 MG TABS Take 1 tablet by mouth daily.  30 tablet  2  . lisinopril (PRINIVIL,ZESTRIL) 5 MG tablet Take 5 mg by mouth daily.      Marland Kitchen LORazepam (ATIVAN) 0.5 MG tablet Take 0.5 mg by mouth daily as needed for anxiety. each morning as needed.      . metoprolol succinate (TOPROL-XL) 50 MG 24 hr tablet Take 50 mg by mouth daily after breakfast. Take with or immediately following a meal.      . Multiple Vitamin (DAILY VITE) TABS Take 1 tablet by mouth daily.      Marland Kitchen ofloxacin (OCUFLOX) 0.3 % ophthalmic solution Place 1 drop into both eyes 4 (four) times daily.  5 mL  0  . ondansetron (ZOFRAN) 8 MG tablet Take by mouth every 8 (eight) hours as needed for nausea or vomiting.      . Pancrelipase, Lip-Prot-Amyl, (CREON) 6000 UNITS CPEP Take 6,000-12,000 Units by mouth 3 (three) times daily. Patient takes 2 capsules three times daily with meals and 1 capsule three times daily with snacks      . polyethylene glycol (MIRALAX / GLYCOLAX) packet Take 17 g by mouth daily as needed for mild constipation.       No current facility-administered medications on file prior to visit.    No Known Allergies  Review of Systems: As listed above, otherwise negative.  Physical Examination  Filed Vitals:    06/26/14 1401  BP: 79/60  Pulse: 66  Height: 5\' 4"  (1.626 m)  Weight: 130 lb (58.968 kg)  SpO2: 99%   Body mass index is 22.3 kg/(m^2).  General: A&O x 3, WDWN.  Gait: she is on a stretcher, otherwise uses her wheelchair  Eyes: Pupils equal  Pulmonary: Respirations are non labored.  Cardiac: regular Rythm , without detected murmur.   VASCULAR EXAM:  Extremities : right foot is cool,molted, 5 knee flexion contracture without Gangrene; without open wounds.   LE Pulses  LEFT  RIGHT   FEMORAL  not palpable  not palpable   POPLITEAL  not palpable  not palpable   POSTERIOR TIBIAL  BKA  non-Dopplerable   DORSALIS PEDIS  ANTERIOR TIBIAL  BKA  non-Dopplerable   Abdomen: soft, NT, no masses.  Skin: no rashes, no ulcers noted.  Musculoskeletal: left BKA.  Neurologic: A&O X 3; Appropriate Affect ; SENSATION: hypersensitive to touch at left foot and lower leg; MOTOR FUNCTION: moving all extremities equally, motor strength 2/5 in right LE, 4/5 in LLE, RUE, LUE. Speech is fluent/normal. CN 2-12 intact.   Non-Invasive Vascular Imaging: DATE: 06/19/2014  ABI: RIGHT 0.62, Waveforms: monophasic; TBI: 0.62 with monophasic waveforms; LEFT BKA    Medical Decision Making  [name] is a 58 y.o. female who presents with pain right foot. She will be scheduled for aortogram with run off by Dr. Donnetta Hutching.   [name] [facility] PA-C [facility] [place] Office: [phone]  06/26/2014, 3:12 PM  Patient has pain in the right leg and foot. Her recent ABIs are  not really consistent with rest pain, however certainly her clinical history goes along with this. She has known popliteal and tibial disease in the right leg. This may have deteriorated over the last year. She'll be scheduled for lower extremity arteriogram by Dr. Donnetta Hutching next week with consideration for either percutaneous or open revascularization. However, she may also need to consider primary amputation since she  overall is fairly debilitated and has a 5 flexion contracture of her right knee.  Ruta Hinds, MD [facility] Office: [phone] Pager: [phone]

## 2014-06-28 ENCOUNTER — Encounter (HOSPITAL_COMMUNITY): Payer: Self-pay | Admitting: Pharmacy Technician

## 2014-07-03 MED ORDER — SODIUM CHLORIDE 0.9 % IV SOLN: INTRAVENOUS | Status: DC

## 2014-07-04 ENCOUNTER — Encounter (HOSPITAL_COMMUNITY): Admission: RE | Disposition: E | Payer: Self-pay | Source: Ambulatory Visit | Attending: Internal Medicine

## 2014-07-04 ENCOUNTER — Encounter (HOSPITAL_COMMUNITY): Payer: Self-pay | Admitting: *Deleted

## 2014-07-04 ENCOUNTER — Inpatient Hospital Stay (HOSPITAL_COMMUNITY)
Admission: RE | Admit: 2014-07-04 | Discharge: 2014-08-07 | DRG: 981 | Disposition: E | Payer: Medicaid Other | Source: Ambulatory Visit | Attending: Internal Medicine | Admitting: Internal Medicine

## 2014-07-04 ENCOUNTER — Ambulatory Visit (HOSPITAL_COMMUNITY): Payer: Medicaid Other

## 2014-07-04 DIAGNOSIS — E7800 Pure hypercholesterolemia, unspecified: Secondary | ICD-10-CM | POA: Diagnosis present

## 2014-07-04 DIAGNOSIS — Z8673 Personal history of transient ischemic attack (TIA), and cerebral infarction without residual deficits: Secondary | ICD-10-CM | POA: Diagnosis not present

## 2014-07-04 DIAGNOSIS — J95851 Ventilator associated pneumonia: Secondary | ICD-10-CM

## 2014-07-04 DIAGNOSIS — N179 Acute kidney failure, unspecified: Principal | ICD-10-CM

## 2014-07-04 DIAGNOSIS — E0811 Diabetes mellitus due to underlying condition with ketoacidosis with coma: Secondary | ICD-10-CM

## 2014-07-04 DIAGNOSIS — IMO0002 Reserved for concepts with insufficient information to code with codable children: Secondary | ICD-10-CM | POA: Diagnosis present

## 2014-07-04 DIAGNOSIS — G8190 Hemiplegia, unspecified affecting unspecified side: Secondary | ICD-10-CM | POA: Diagnosis not present

## 2014-07-04 DIAGNOSIS — Z8249 Family history of ischemic heart disease and other diseases of the circulatory system: Secondary | ICD-10-CM | POA: Diagnosis not present

## 2014-07-04 DIAGNOSIS — E1165 Type 2 diabetes mellitus with hyperglycemia: Secondary | ICD-10-CM | POA: Diagnosis present

## 2014-07-04 DIAGNOSIS — R197 Diarrhea, unspecified: Secondary | ICD-10-CM

## 2014-07-04 DIAGNOSIS — K089 Disorder of teeth and supporting structures, unspecified: Secondary | ICD-10-CM | POA: Diagnosis present

## 2014-07-04 DIAGNOSIS — I771 Stricture of artery: Secondary | ICD-10-CM | POA: Diagnosis present

## 2014-07-04 DIAGNOSIS — J80 Acute respiratory distress syndrome: Secondary | ICD-10-CM

## 2014-07-04 DIAGNOSIS — D649 Anemia, unspecified: Secondary | ICD-10-CM | POA: Diagnosis not present

## 2014-07-04 DIAGNOSIS — N289 Disorder of kidney and ureter, unspecified: Secondary | ICD-10-CM

## 2014-07-04 DIAGNOSIS — G4700 Insomnia, unspecified: Secondary | ICD-10-CM | POA: Diagnosis present

## 2014-07-04 DIAGNOSIS — E876 Hypokalemia: Secondary | ICD-10-CM

## 2014-07-04 DIAGNOSIS — Z79899 Other long term (current) drug therapy: Secondary | ICD-10-CM | POA: Diagnosis not present

## 2014-07-04 DIAGNOSIS — Z21 Asymptomatic human immunodeficiency virus [HIV] infection status: Secondary | ICD-10-CM | POA: Diagnosis present

## 2014-07-04 DIAGNOSIS — I509 Heart failure, unspecified: Secondary | ICD-10-CM | POA: Diagnosis present

## 2014-07-04 DIAGNOSIS — I6529 Occlusion and stenosis of unspecified carotid artery: Secondary | ICD-10-CM | POA: Diagnosis present

## 2014-07-04 DIAGNOSIS — I6509 Occlusion and stenosis of unspecified vertebral artery: Secondary | ICD-10-CM | POA: Diagnosis present

## 2014-07-04 DIAGNOSIS — I70219 Atherosclerosis of native arteries of extremities with intermittent claudication, unspecified extremity: Secondary | ICD-10-CM | POA: Diagnosis present

## 2014-07-04 DIAGNOSIS — J189 Pneumonia, unspecified organism: Secondary | ICD-10-CM

## 2014-07-04 DIAGNOSIS — I998 Other disorder of circulatory system: Secondary | ICD-10-CM

## 2014-07-04 DIAGNOSIS — G9341 Metabolic encephalopathy: Secondary | ICD-10-CM | POA: Diagnosis not present

## 2014-07-04 DIAGNOSIS — B1920 Unspecified viral hepatitis C without hepatic coma: Secondary | ICD-10-CM | POA: Diagnosis present

## 2014-07-04 DIAGNOSIS — E785 Hyperlipidemia, unspecified: Secondary | ICD-10-CM | POA: Diagnosis present

## 2014-07-04 DIAGNOSIS — D721 Eosinophilia, unspecified: Secondary | ICD-10-CM | POA: Diagnosis not present

## 2014-07-04 DIAGNOSIS — F39 Unspecified mood [affective] disorder: Secondary | ICD-10-CM | POA: Diagnosis present

## 2014-07-04 DIAGNOSIS — I70209 Unspecified atherosclerosis of native arteries of extremities, unspecified extremity: Secondary | ICD-10-CM | POA: Diagnosis present

## 2014-07-04 DIAGNOSIS — J9600 Acute respiratory failure, unspecified whether with hypoxia or hypercapnia: Secondary | ICD-10-CM

## 2014-07-04 DIAGNOSIS — M129 Arthropathy, unspecified: Secondary | ICD-10-CM | POA: Diagnosis present

## 2014-07-04 DIAGNOSIS — E1059 Type 1 diabetes mellitus with other circulatory complications: Secondary | ICD-10-CM | POA: Diagnosis present

## 2014-07-04 DIAGNOSIS — I5042 Chronic combined systolic (congestive) and diastolic (congestive) heart failure: Secondary | ICD-10-CM | POA: Diagnosis present

## 2014-07-04 DIAGNOSIS — E874 Mixed disorder of acid-base balance: Secondary | ICD-10-CM | POA: Diagnosis not present

## 2014-07-04 DIAGNOSIS — Z9849 Cataract extraction status, unspecified eye: Secondary | ICD-10-CM

## 2014-07-04 DIAGNOSIS — I70229 Atherosclerosis of native arteries of extremities with rest pain, unspecified extremity: Secondary | ICD-10-CM | POA: Diagnosis not present

## 2014-07-04 DIAGNOSIS — R6520 Severe sepsis without septic shock: Secondary | ICD-10-CM

## 2014-07-04 DIAGNOSIS — I10 Essential (primary) hypertension: Secondary | ICD-10-CM | POA: Diagnosis present

## 2014-07-04 DIAGNOSIS — I63319 Cerebral infarction due to thrombosis of unspecified middle cerebral artery: Secondary | ICD-10-CM

## 2014-07-04 DIAGNOSIS — R5381 Other malaise: Secondary | ICD-10-CM | POA: Diagnosis present

## 2014-07-04 DIAGNOSIS — E1310 Other specified diabetes mellitus with ketoacidosis without coma: Secondary | ICD-10-CM | POA: Diagnosis not present

## 2014-07-04 DIAGNOSIS — I7389 Other specified peripheral vascular diseases: Secondary | ICD-10-CM | POA: Diagnosis present

## 2014-07-04 DIAGNOSIS — I428 Other cardiomyopathies: Secondary | ICD-10-CM | POA: Diagnosis present

## 2014-07-04 DIAGNOSIS — T380X5A Adverse effect of glucocorticoids and synthetic analogues, initial encounter: Secondary | ICD-10-CM | POA: Diagnosis not present

## 2014-07-04 DIAGNOSIS — M629 Disorder of muscle, unspecified: Secondary | ICD-10-CM

## 2014-07-04 DIAGNOSIS — Z794 Long term (current) use of insulin: Secondary | ICD-10-CM

## 2014-07-04 DIAGNOSIS — E861 Hypovolemia: Secondary | ICD-10-CM | POA: Diagnosis not present

## 2014-07-04 DIAGNOSIS — S88119A Complete traumatic amputation at level between knee and ankle, unspecified lower leg, initial encounter: Secondary | ICD-10-CM | POA: Diagnosis not present

## 2014-07-04 DIAGNOSIS — G8929 Other chronic pain: Secondary | ICD-10-CM | POA: Diagnosis present

## 2014-07-04 DIAGNOSIS — F3289 Other specified depressive episodes: Secondary | ICD-10-CM

## 2014-07-04 DIAGNOSIS — K219 Gastro-esophageal reflux disease without esophagitis: Secondary | ICD-10-CM | POA: Diagnosis present

## 2014-07-04 DIAGNOSIS — R9431 Abnormal electrocardiogram [ECG] [EKG]: Secondary | ICD-10-CM

## 2014-07-04 DIAGNOSIS — F17200 Nicotine dependence, unspecified, uncomplicated: Secondary | ICD-10-CM | POA: Diagnosis present

## 2014-07-04 DIAGNOSIS — A419 Sepsis, unspecified organism: Secondary | ICD-10-CM | POA: Diagnosis not present

## 2014-07-04 DIAGNOSIS — B182 Chronic viral hepatitis C: Secondary | ICD-10-CM

## 2014-07-04 DIAGNOSIS — Z515 Encounter for palliative care: Secondary | ICD-10-CM | POA: Diagnosis not present

## 2014-07-04 DIAGNOSIS — R509 Fever, unspecified: Secondary | ICD-10-CM

## 2014-07-04 DIAGNOSIS — B20 Human immunodeficiency virus [HIV] disease: Secondary | ICD-10-CM

## 2014-07-04 DIAGNOSIS — M79671 Pain in right foot: Secondary | ICD-10-CM

## 2014-07-04 DIAGNOSIS — Z7982 Long term (current) use of aspirin: Secondary | ICD-10-CM

## 2014-07-04 DIAGNOSIS — R5383 Other fatigue: Secondary | ICD-10-CM

## 2014-07-04 DIAGNOSIS — R000 Tachycardia, unspecified: Secondary | ICD-10-CM

## 2014-07-04 DIAGNOSIS — I959 Hypotension, unspecified: Secondary | ICD-10-CM

## 2014-07-04 DIAGNOSIS — J690 Pneumonitis due to inhalation of food and vomit: Secondary | ICD-10-CM

## 2014-07-04 DIAGNOSIS — Z66 Do not resuscitate: Secondary | ICD-10-CM | POA: Diagnosis not present

## 2014-07-04 DIAGNOSIS — L98499 Non-pressure chronic ulcer of skin of other sites with unspecified severity: Secondary | ICD-10-CM

## 2014-07-04 DIAGNOSIS — R569 Unspecified convulsions: Secondary | ICD-10-CM | POA: Diagnosis not present

## 2014-07-04 DIAGNOSIS — K861 Other chronic pancreatitis: Secondary | ICD-10-CM | POA: Diagnosis present

## 2014-07-04 DIAGNOSIS — J810 Acute pulmonary edema: Secondary | ICD-10-CM

## 2014-07-04 DIAGNOSIS — E870 Hyperosmolality and hypernatremia: Secondary | ICD-10-CM | POA: Diagnosis present

## 2014-07-04 DIAGNOSIS — M79609 Pain in unspecified limb: Secondary | ICD-10-CM

## 2014-07-04 DIAGNOSIS — I5031 Acute diastolic (congestive) heart failure: Secondary | ICD-10-CM

## 2014-07-04 DIAGNOSIS — R112 Nausea with vomiting, unspecified: Secondary | ICD-10-CM | POA: Diagnosis not present

## 2014-07-04 DIAGNOSIS — F329 Major depressive disorder, single episode, unspecified: Secondary | ICD-10-CM

## 2014-07-04 DIAGNOSIS — E1169 Type 2 diabetes mellitus with other specified complication: Secondary | ICD-10-CM

## 2014-07-04 DIAGNOSIS — R739 Hyperglycemia, unspecified: Secondary | ICD-10-CM

## 2014-07-04 DIAGNOSIS — E1051 Type 1 diabetes mellitus with diabetic peripheral angiopathy without gangrene: Secondary | ICD-10-CM

## 2014-07-04 DIAGNOSIS — I639 Cerebral infarction, unspecified: Secondary | ICD-10-CM

## 2014-07-04 DIAGNOSIS — G9340 Encephalopathy, unspecified: Secondary | ICD-10-CM

## 2014-07-04 LAB — CBC
HCT: 34.4 % — ABNORMAL LOW (ref 36.0–46.0)
Hemoglobin: 11.3 g/dL — ABNORMAL LOW (ref 12.0–15.0)
MCH: 29.6 pg (ref 26.0–34.0)
MCHC: 32.8 g/dL (ref 30.0–36.0)
MCV: 90.1 fL (ref 78.0–100.0)
Platelets: 238 10*3/uL (ref 150–400)
RBC: 3.82 MIL/uL — AB (ref 3.87–5.11)
RDW: 18.8 % — ABNORMAL HIGH (ref 11.5–15.5)
WBC: 10.2 10*3/uL (ref 4.0–10.5)

## 2014-07-04 LAB — MRSA PCR SCREENING: MRSA BY PCR: POSITIVE — AB

## 2014-07-04 LAB — GLUCOSE, CAPILLARY
GLUCOSE-CAPILLARY: 193 mg/dL — AB (ref 70–99)
GLUCOSE-CAPILLARY: 53 mg/dL — AB (ref 70–99)
GLUCOSE-CAPILLARY: 243 mg/dL — AB (ref 70–99)
Glucose-Capillary: 49 mg/dL — ABNORMAL LOW (ref 70–99)
Glucose-Capillary: 45 mg/dL — ABNORMAL LOW (ref 70–99)
Glucose-Capillary: 252 mg/dL — ABNORMAL HIGH (ref 70–99)
Glucose-Capillary: 220 mg/dL — ABNORMAL HIGH (ref 70–99)
Glucose-Capillary: 250 mg/dL — ABNORMAL HIGH (ref 70–99)
Glucose-Capillary: 192 mg/dL — ABNORMAL HIGH (ref 70–99)

## 2014-07-04 LAB — POCT I-STAT, CHEM 8
BUN: 22 mg/dL (ref 6–23)
Calcium, Ion: 1.27 mmol/L — ABNORMAL HIGH (ref 1.12–1.23)
Chloride: 120 mEq/L — ABNORMAL HIGH (ref 96–112)
Creatinine, Ser: 2.5 mg/dL — ABNORMAL HIGH (ref 0.50–1.10)
Glucose, Bld: 50 mg/dL — ABNORMAL LOW (ref 70–99)
HCT: 38 % (ref 36.0–46.0)
HEMOGLOBIN: 12.9 g/dL (ref 12.0–15.0)
Potassium: 2.5 mEq/L — CL (ref 3.7–5.3)
SODIUM: 146 meq/L (ref 137–147)
TCO2: 15 mmol/L (ref 0–100)

## 2014-07-04 LAB — HEPATIC FUNCTION PANEL
ALT: 17 U/L (ref 0–35)
AST: 25 U/L (ref 0–37)
Albumin: 3 g/dL — ABNORMAL LOW (ref 3.5–5.2)
Alkaline Phosphatase: 157 U/L — ABNORMAL HIGH (ref 39–117)
Bilirubin, Direct: <0.2 mg/dL (ref 0.0–0.3)
TOTAL PROTEIN: 7.3 g/dL (ref 6.0–8.3)
Total Bilirubin: <0.2 mg/dL — ABNORMAL LOW (ref 0.3–1.2)

## 2014-07-04 LAB — BASIC METABOLIC PANEL
ANION GAP: 15 (ref 5–15)
BUN: 20 mg/dL (ref 6–23)
CALCIUM: 8.4 mg/dL (ref 8.4–10.5)
CO2: 16 meq/L — AB (ref 19–32)
Chloride: 114 mEq/L — ABNORMAL HIGH (ref 96–112)
Creatinine, Ser: 2.38 mg/dL — ABNORMAL HIGH (ref 0.50–1.10)
GFR calc Af Amer: 25 mL/min — ABNORMAL LOW (ref >90)
GFR, EST NON AFRICAN AMERICAN: 21 mL/min — AB (ref >90)
GLUCOSE: 145 mg/dL — AB (ref 70–99)
POTASSIUM: 2.7 meq/L — AB (ref 3.7–5.3)
SODIUM: 145 meq/L (ref 137–147)

## 2014-07-04 LAB — PROCALCITONIN: Procalcitonin: <0.1 ng/mL

## 2014-07-04 LAB — MAGNESIUM: Magnesium: 2.3 mg/dL (ref 1.5–2.5)

## 2014-07-04 LAB — LIPASE, BLOOD: Lipase: 8 U/L — ABNORMAL LOW (ref 11–59)

## 2014-07-04 SURGERY — ABDOMINAL AORTAGRAM
Anesthesia: LOCAL

## 2014-07-04 MED ORDER — MUPIROCIN 2 % EX OINT
1.0000 "application " | TOPICAL_OINTMENT | Freq: Two times a day (BID) | CUTANEOUS | Status: AC
  Administered 2014-07-04 – 2014-07-08 (×9): 1 via NASAL
  Filled 2014-07-04 (×3): qty 22

## 2014-07-04 MED ORDER — DOLUTEGRAVIR SODIUM 50 MG PO TABS
50.0000 mg | ORAL_TABLET | Freq: Every day | ORAL | Status: DC
  Administered 2014-07-04: 50 mg via ORAL
  Filled 2014-07-04: qty 1

## 2014-07-04 MED ORDER — HEPARIN SODIUM (PORCINE) 5000 UNIT/ML IJ SOLN
5000.0000 [IU] | Freq: Three times a day (TID) | INTRAMUSCULAR | Status: DC
  Administered 2014-07-04 – 2014-07-21 (×46): 5000 [IU] via SUBCUTANEOUS
  Filled 2014-07-04 (×54): qty 1

## 2014-07-04 MED ORDER — LORATADINE 10 MG PO TABS
10.0000 mg | ORAL_TABLET | Freq: Every day | ORAL | Status: DC
  Administered 2014-07-05 – 2014-07-08 (×4): 10 mg via ORAL
  Filled 2014-07-04 (×8): qty 1

## 2014-07-04 MED ORDER — ASPIRIN EC 81 MG PO TBEC
81.0000 mg | DELAYED_RELEASE_TABLET | Freq: Every day | ORAL | Status: DC
  Administered 2014-07-05 – 2014-07-17 (×9): 81 mg via ORAL
  Filled 2014-07-04 (×13): qty 1

## 2014-07-04 MED ORDER — DEXTROSE 50 % IV SOLN
INTRAVENOUS | Status: AC
  Filled 2014-07-04: qty 50

## 2014-07-04 MED ORDER — PANCRELIPASE (LIP-PROT-AMYL) 12000-38000 UNITS PO CPEP
24000.0000 [IU] | ORAL_CAPSULE | Freq: Three times a day (TID) | ORAL | Status: DC
  Administered 2014-07-04 – 2014-07-10 (×15): 24000 [IU] via ORAL
  Filled 2014-07-04 (×26): qty 2

## 2014-07-04 MED ORDER — INSULIN ASPART 100 UNIT/ML ~~LOC~~ SOLN
0.0000 [IU] | Freq: Three times a day (TID) | SUBCUTANEOUS | Status: DC
Start: 2014-07-04 — End: 2014-07-12
  Administered 2014-07-04: 3 [IU] via SUBCUTANEOUS
  Administered 2014-07-05: 2 [IU] via SUBCUTANEOUS
  Administered 2014-07-05 – 2014-07-06 (×3): 1 [IU] via SUBCUTANEOUS
  Administered 2014-07-07: 2 [IU] via SUBCUTANEOUS
  Administered 2014-07-07: 3 [IU] via SUBCUTANEOUS
  Administered 2014-07-07: 1 [IU] via SUBCUTANEOUS
  Administered 2014-07-08 (×3): 2 [IU] via SUBCUTANEOUS
  Administered 2014-07-09: 3 [IU] via SUBCUTANEOUS
  Administered 2014-07-09: 1 [IU] via SUBCUTANEOUS
  Administered 2014-07-10 – 2014-07-11 (×5): 2 [IU] via SUBCUTANEOUS
  Administered 2014-07-12: 5 [IU] via SUBCUTANEOUS

## 2014-07-04 MED ORDER — POTASSIUM CHLORIDE CRYS ER 20 MEQ PO TBCR
20.0000 meq | EXTENDED_RELEASE_TABLET | Freq: Two times a day (BID) | ORAL | Status: DC
  Administered 2014-07-04: 20 meq via ORAL

## 2014-07-04 MED ORDER — HYDRALAZINE HCL 20 MG/ML IJ SOLN: 5.0000 mg | Freq: Four times a day (QID) | INTRAMUSCULAR | Status: DC | PRN

## 2014-07-04 MED ORDER — SODIUM CHLORIDE 0.9 % IV SOLN
INTRAVENOUS | Status: DC
  Administered 2014-07-04 – 2014-07-05 (×4): via INTRAVENOUS

## 2014-07-04 MED ORDER — CHLORHEXIDINE GLUCONATE CLOTH 2 % EX PADS
6.0000 | MEDICATED_PAD | Freq: Every day | CUTANEOUS | Status: AC
  Administered 2014-07-05 – 2014-07-09 (×6): 6 via TOPICAL

## 2014-07-04 MED ORDER — MORPHINE SULFATE 2 MG/ML IJ SOLN
2.0000 mg | INTRAMUSCULAR | Status: DC | PRN
  Administered 2014-07-04 – 2014-07-10 (×6): 2 mg via INTRAVENOUS
  Filled 2014-07-04 (×6): qty 1

## 2014-07-04 MED ORDER — DEXTROSE 50 % IV SOLN
50.0000 mL | Freq: Once | INTRAVENOUS | Status: AC | PRN
  Administered 2014-07-04: 50 mL via INTRAVENOUS

## 2014-07-04 MED ORDER — HYDROXYZINE PAMOATE 25 MG PO CAPS
25.0000 mg | ORAL_CAPSULE | Freq: Four times a day (QID) | ORAL | Status: DC | PRN
  Filled 2014-07-04: qty 1

## 2014-07-04 MED ORDER — SODIUM CHLORIDE 0.9 % IV BOLUS (SEPSIS): 500.0000 mL | Freq: Once | INTRAVENOUS | Status: DC

## 2014-07-04 MED ORDER — MORPHINE SULFATE 10 MG/ML IJ SOLN: 2.0000 mg | INTRAMUSCULAR | Status: DC | PRN

## 2014-07-04 MED ORDER — OXYCODONE-ACETAMINOPHEN 5-325 MG PO TABS
1.0000 | ORAL_TABLET | ORAL | Status: DC | PRN
  Administered 2014-07-04: 2 via ORAL

## 2014-07-04 MED ORDER — OXYCODONE HCL 5 MG PO TABS
5.0000 mg | ORAL_TABLET | ORAL | Status: DC | PRN
  Administered 2014-07-05: 5 mg via ORAL
  Administered 2014-07-06: 10 mg via ORAL
  Administered 2014-07-07: 5 mg via ORAL
  Administered 2014-07-07 – 2014-07-10 (×3): 10 mg via ORAL
  Filled 2014-07-04 (×2): qty 2
  Filled 2014-07-04: qty 1
  Filled 2014-07-04: qty 2
  Filled 2014-07-04: qty 1
  Filled 2014-07-04: qty 2

## 2014-07-04 MED ORDER — DEXTROSE 50 % IV SOLN
50.0000 mL | Freq: Once | INTRAVENOUS | Status: AC
  Administered 2014-07-04: 50 mL via INTRAVENOUS

## 2014-07-04 MED ORDER — POTASSIUM CHLORIDE CRYS ER 20 MEQ PO TBCR
20.0000 meq | EXTENDED_RELEASE_TABLET | Freq: Every day | ORAL | Status: DC
  Administered 2014-07-05 – 2014-07-07 (×3): 20 meq via ORAL
  Filled 2014-07-04 (×5): qty 1

## 2014-07-04 MED ORDER — PANCRELIPASE (LIP-PROT-AMYL) 12000-38000 UNITS PO CPEP
12000.0000 [IU] | ORAL_CAPSULE | ORAL | Status: DC | PRN
  Filled 2014-07-04: qty 1

## 2014-07-04 MED ORDER — POTASSIUM CHLORIDE CRYS ER 20 MEQ PO TBCR
EXTENDED_RELEASE_TABLET | ORAL | Status: AC
  Filled 2014-07-04: qty 1

## 2014-07-04 MED ORDER — POLYETHYLENE GLYCOL 3350 17 G PO PACK
17.0000 g | PACK | Freq: Every day | ORAL | Status: DC | PRN
  Filled 2014-07-04: qty 1

## 2014-07-04 MED ORDER — SODIUM CHLORIDE 0.9 % IJ SOLN
3.0000 mL | Freq: Two times a day (BID) | INTRAMUSCULAR | Status: DC
  Administered 2014-07-07 – 2014-07-13 (×7): 3 mL via INTRAVENOUS

## 2014-07-04 MED ORDER — ALUM & MAG HYDROXIDE-SIMETH 200-200-20 MG/5ML PO SUSP
30.0000 mL | Freq: Four times a day (QID) | ORAL | Status: DC | PRN
  Filled 2014-07-04: qty 30

## 2014-07-04 MED ORDER — POLYETHYLENE GLYCOL 3350 17 G PO PACK: 17.0000 g | PACK | Freq: Every day | ORAL | Status: DC | PRN

## 2014-07-04 MED ORDER — LORAZEPAM 0.5 MG PO TABS
0.5000 mg | ORAL_TABLET | Freq: Two times a day (BID) | ORAL | Status: DC | PRN
  Administered 2014-07-09: 0.5 mg via ORAL
  Filled 2014-07-04: qty 1

## 2014-07-04 MED ORDER — ONDANSETRON HCL 4 MG PO TABS
4.0000 mg | ORAL_TABLET | Freq: Three times a day (TID) | ORAL | Status: DC | PRN
  Administered 2014-07-05 – 2014-07-06 (×3): 4 mg via ORAL
  Filled 2014-07-04 (×3): qty 1

## 2014-07-04 MED ORDER — ADULT MULTIVITAMIN W/MINERALS CH
1.0000 | ORAL_TABLET | Freq: Every day | ORAL | Status: DC
Start: 2014-07-05 — End: 2014-07-12
  Administered 2014-07-05 – 2014-07-08 (×4): 1 via ORAL
  Filled 2014-07-04 (×8): qty 1

## 2014-07-04 MED ORDER — SERTRALINE HCL 100 MG PO TABS
100.0000 mg | ORAL_TABLET | Freq: Every day | ORAL | Status: DC
  Administered 2014-07-04 – 2014-07-08 (×5): 100 mg via ORAL
  Filled 2014-07-04 (×9): qty 1

## 2014-07-04 MED ORDER — OXYCODONE-ACETAMINOPHEN 5-325 MG PO TABS
ORAL_TABLET | ORAL | Status: AC
  Filled 2014-07-04: qty 2

## 2014-07-04 MED ORDER — INSULIN GLARGINE 100 UNIT/ML ~~LOC~~ SOLN
20.0000 [IU] | Freq: Every day | SUBCUTANEOUS | Status: DC
  Administered 2014-07-04 – 2014-07-05 (×2): 20 [IU] via SUBCUTANEOUS
  Filled 2014-07-04 (×3): qty 0.2

## 2014-07-04 MED ORDER — DOCUSATE SODIUM 100 MG PO CAPS
100.0000 mg | ORAL_CAPSULE | Freq: Every day | ORAL | Status: DC
  Administered 2014-07-05 – 2014-07-08 (×4): 100 mg via ORAL
  Filled 2014-07-04 (×9): qty 1

## 2014-07-04 MED ORDER — ACETAMINOPHEN 650 MG RE SUPP
650.0000 mg | Freq: Four times a day (QID) | RECTAL | Status: DC | PRN
  Administered 2014-07-11 – 2014-07-12 (×2): 650 mg via RECTAL
  Filled 2014-07-04 (×2): qty 1

## 2014-07-04 MED ORDER — EMTRICITABINE-TENOFOVIR DF 200-300 MG PO TABS: 1.0000 | ORAL_TABLET | Freq: Every day | ORAL | Status: DC

## 2014-07-04 MED ORDER — ATORVASTATIN CALCIUM 10 MG PO TABS
10.0000 mg | ORAL_TABLET | Freq: Every day | ORAL | Status: DC
  Administered 2014-07-04 – 2014-07-09 (×5): 10 mg via ORAL
  Filled 2014-07-04 (×10): qty 1

## 2014-07-04 MED ORDER — PANCRELIPASE (LIP-PROT-AMYL) 6000-19000 UNITS PO CPEP: 6000.0000 [IU] | ORAL_CAPSULE | ORAL | Status: DC

## 2014-07-04 MED ORDER — OXYCODONE HCL 5 MG PO TABS: 5.0000 mg | ORAL_TABLET | ORAL | Status: DC | PRN

## 2014-07-04 MED ORDER — ACETAMINOPHEN 325 MG PO TABS: 650.0000 mg | ORAL_TABLET | Freq: Four times a day (QID) | ORAL | Status: DC | PRN

## 2014-07-04 MED ORDER — POTASSIUM CHLORIDE 10 MEQ/100ML IV SOLN
10.0000 meq | INTRAVENOUS | Status: AC
  Administered 2014-07-04 (×3): 10 meq via INTRAVENOUS
  Filled 2014-07-04 (×3): qty 100

## 2014-07-04 MED ORDER — DAILY VITE PO TABS: 1.0000 | ORAL_TABLET | Freq: Every day | ORAL | Status: DC

## 2014-07-04 NOTE — Progress Notes (Signed)
Patient ID: [name], female   DOB: 1955-12-13, 58 y.o.   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 [START ON 07/05/2014] aspirin EC  81 mg Oral Daily  . atorvastatin  10 mg Oral QHS  . dextrose      . dextrose      . [START ON 07/05/2014] docusate sodium  100 mg Oral Daily  . dolutegravir  50 mg Oral Daily  . [START ON 07/05/2014] emtricitabine-tenofovir  1 tablet Oral Daily  . heparin  5,000 Units Subcutaneous 3 times per day  . insulin aspart  0-9 Units Subcutaneous TID WC  . insulin glargine  20 Units Subcutaneous QHS  . lipase/protease/amylase  24,000 Units Oral TID WC  . [START ON 07/05/2014] loratadine  10 mg Oral Daily  . [START ON 07/05/2014] multivitamin with minerals  1 tablet Oral Daily  . oxyCODONE-acetaminophen      . potassium chloride  10 mEq Intravenous Q1 Hr x 3  . potassium chloride SA      . [START ON 07/05/2014] potassium chloride  20 mEq Oral Daily  . sertraline  100 mg Oral Daily  . sodium chloride  3 mL Intravenous Q12H    Subjective: Ms. Grussing is a 58 year old who is followed by my partner, Dr. Tommy Medal, for HIV and hepatitis C coinfection. Her HIV infection has been very well controlled for many years. In early February her anti-retroviral regimen was changed from Atripla to Truvada and Tivicay so she could start on a new hepatitis C medication, Harvoni. That medication was started on April 20. She just recently completed her 12 week course of therapy. She has been having problems with right foot pain and discoloration for several weeks leading to admission today where she was found to have an acute increase in her  creatinine.  Review of Systems: Constitutional: negative Eyes: negative Ears, nose, mouth, throat, and face: negative Respiratory: negative Cardiovascular: negative Gastrointestinal: positive for nausea and vomiting, negative for abdominal pain and diarrhea Genitourinary:negative  Past Medical History  Diagnosis Date  . Depression   . Neuropathy   . Mood disorder   . TIA (transient ischemic attack)   . Pancreatitis chronic   . Chronic pain   . HIV (human immunodeficiency virus infection) 1990's    takes Norpramin nightly  . Polysubstance abuse   . Cardiomyopathy, nonischemic     EF 45%  . Peripheral vascular disease   . Asthma     "used to when I was young" (02/20/2013)  . Arthritis     "legs and arms" (02/20/2013)  . Hypercholesterolemia     takes Lipitor nightly  . Seasonal allergies     takes Zyrtec daily  . Anemia     takes Iron pill daily  . Nausea     takes Zofran prn  . GERD (gastroesophageal reflux disease)     takes Prilosec daily  . Insomnia     takes Trazodone nightly  . Constipation     takes miralax prn and stool softener daily  . Type II diabetes  mellitus     lantus 45units at bedtime  . Dysrhythmia     takes Metoprolol daily  . Weakness of both legs   . Peripheral neuropathy   . Chronic back pain   . Hepatitis C     C  . Urinary frequency   . Nocturia     History  Substance Use Topics  . Smoking status: Current Every Day Smoker -- 0.25 packs/day for 40 years    Types: Cigarettes    Last Attempt to Quit: 01/12/2013  . Smokeless tobacco: Never Used     Comment: pt states she quits yesterday and is not going to smoke if she can help it  . Alcohol Use: No     Comment: 02/20/2013 "used to have problems w/alcohol; haven't drank anything in 4 yr"    Family History  Problem Relation Age of Onset  . Coronary artery disease Father 77    No Known Allergies  Objective: Temp:  [97.4 F (36.3 C)-97.8 F (36.6 C)] 97.4 F (36.3 C) (07/29  1615) Pulse Rate:  [57-60] 57 (07/29 1615) Resp:  [18] 18 (07/29 1615) BP: (75-97)/(53-56) 97/55 mmHg (07/29 1615) SpO2:  [97 %-99 %] 97 % (07/29 1615) Weight:  [58.968 kg (130 lb)] 58.968 kg (130 lb) (07/29 1616)  General: She is in no distress lying quietly in bed Skin: No rash Lungs: Clear Cor: Regular S1 and S2 no murmurs Abdomen: Soft and nontender Joints and extremities: Left BKA completely healed. Right foot is cold and cyanotic  Lab Results Lab Results  Component Value Date   WBC 10.2 06/10/2014   HGB 11.3* 06/28/2014   HCT 34.4* 06/15/2014   MCV 90.1 06/28/2014   PLT 238 06/16/2014    Lab Results  Component Value Date   CREATININE 2.38* 06/06/2014   BUN 20 06/26/2014   NA 145 06/07/2014   K 2.7* 06/17/2014   CL 114* 07/03/2014   CO2 16* 06/19/2014    Lab Results  Component Value Date   ALT 17 06/21/2014   AST 25 06/21/2014   ALKPHOS 157* 06/19/2014   BILITOT <0.2* 07/06/2014      HIV 1 RNA Quant (copies/mL)  Date Value  11/21/2013 <20   08/01/2013 <20   02/13/2013 <20      CD4 T Cell Abs (/uL)  Date Value  11/21/2013 1140   08/01/2013 960   02/13/2013 550    Microbiology: No results found for this or any previous visit (from the past 240 hour(s)).  Studies/Results: Dg Chest Port 1 View  06/20/2014   CLINICAL DATA:  Acidosis  EXAM: PORTABLE CHEST - 1 VIEW  COMPARISON:  02/20/2013  FINDINGS: There are bilateral chronic bronchitic changes. There is no focal parenchymal opacity, pleural effusion, or pneumothorax. The heart and mediastinal contours are unremarkable.  The osseous structures are unremarkable.  IMPRESSION: No active disease.   Electronically Signed   By: Kathreen Devoid   On: 06/14/2014 14:58    Assessment: She's developed acute renal insufficiency in the setting of new right lower extremity ischemia. Tenofovir, a component of Atripla and Truvada is a well-known cause of kidney injury. Tivicay is only rarely implicated. I am not aware of any risk of kidney  injury with Harvoni. Of course, she has multiple other potential causes for kidney injury including arterial insufficiency, diabetes, hypertension and lisinopril. I will hold her anti-retroviral therapy for now.  Plan: 1. Hold Truvada and Tivicay 2. Check CD4 count and HIV viral load 3. Check hepatitis  C viral load  Michel Bickers, MD [facility] [phone] pager   [phone] cell 06/07/2014, 6:28 PM

## 2014-07-04 NOTE — Progress Notes (Signed)
REPORT CALLED TO RN FOR [phone] AND TRANSFERRED VIA W/C

## 2014-07-04 NOTE — Consult Note (Signed)
  The patient is well known to me from prior left below-knee amputation in March 2014. She presented with tissue loss. Arteriogram at that time revealed severe bilateral tibial disease. She did undergo popliteal and tibial angioplasty by Dr. Trula Slade but this was not successful for limb salvage. She had a below-knee amputation and eventually healed this. She is a nursing home and is able to transfer from bed to chair but does not ambulate. She presented to our office approximately 2 weeks ago with progressive rest pain in her left foot and was scheduled for arteriography by myself today. Her last creatinine was 1.3 in March of 2015.  Today she presents with a creatinine of 2.5 and potassium of 2.5. She does have severe pain and tissue loss. On physical exam she has a 2+ right femoral and 2+ right popliteal pulse.  At long discussion with the patient. With her physical findings and known severe tibial disease I think is very little chance that we would find anything correctable for limb salvage. I explained with a creatinine of 2.5 that she is at extremely high risk for renal failure with arteriography. I have canceled her arteriogram for today. I did discuss with [facility] hospitalist, Dr.Ghimire, her case. She will be admitted to team 10 for stabilization of her renal function and hypokalemia. We will follow as well and determine the primary amputation versus attempt at arteriogram.

## 2014-07-04 NOTE — Progress Notes (Signed)
Critical K+ 2.7 report by [facility] in lab.  Dr Early notified will continue to monitor.

## 2014-07-04 NOTE — Progress Notes (Signed)
Pt transferred via wheelchair to [phone]. VSS. BP is 97/63. Pt placed on telemetry box 16. Call bell within reach. Alert and oriented.

## 2014-07-04 NOTE — Progress Notes (Signed)
DR Sloan Leiter NOTIFIED OF CBG AND ORDER NOTED

## 2014-07-04 NOTE — H&P (Signed)
PATIENT DETAILS Name: [name] Age: 58 y.o. Sex: female Date of Birth: 21-Oct-1956 Admit Date: 06/27/2014 PCP:Pcp Not In System  CHIEF COMPLAINT:  Right foot pain  HPI: [name] is a 58 y.o. female with a Past Medical History of HIV, T2DM, Hepatitis C, and left BKA who presents today with the above noted complaint. She states that for the past several days, her right foot and ankle have become increasingly painful, discolored and cold. She ambulates via wheelchair. Patient endorses nausea unchanged from baseline accompanied by intermittent vomiting, last episode 3 days ago. She also has been experiencing midline lumbosacral pain in bandlike distribution. Patient lives at a nursing home which administers her medications and is followed by ID for HIV management. Past history of IVDA several years ago but currently no tobacco, alcohol or illicit drug use. Patient denies headache, dizziness, chest pain, palpitations, SOB, abdominal pain, diarrhea, constipation, dysuria and hematuria. Urine has been darker recently; patient drinks several glasses of tea and about one glass of water each day. No prior history of kidney stones.   ALLERGIES:  No Known Allergies  PAST MEDICAL HISTORY: Past Medical History  Diagnosis Date  . Depression   . Neuropathy   . Mood disorder   . TIA (transient ischemic attack)   . Pancreatitis chronic   . Chronic pain   . HIV (human immunodeficiency virus infection) 1990's    takes Norpramin nightly  . Polysubstance abuse   . Cardiomyopathy, nonischemic     EF 45%  . Peripheral vascular disease   . Asthma     "used to when I was young" (02/20/2013)  . Arthritis     "legs and arms" (02/20/2013)  . Hypercholesterolemia     takes Lipitor nightly  . Seasonal allergies     takes Zyrtec daily  . Anemia     takes Iron pill daily  . Nausea     takes Zofran prn  . GERD (gastroesophageal reflux disease)     takes Prilosec daily  . Insomnia    takes Trazodone nightly  . Constipation     takes miralax prn and stool softener daily  . Type II diabetes mellitus     lantus 45units at bedtime  . Dysrhythmia     takes Metoprolol daily  . Weakness of both legs   . Peripheral neuropathy   . Chronic back pain   . Hepatitis C     C  . Urinary frequency   . Nocturia    PAST SURGICAL HISTORY: Past Surgical History  Procedure Laterality Date  . Carotid endarterectomy Right   . Tubal ligation    . Lower extremity angiogram    . Toe amputation    . Amputation Left 04/28/2013    Procedure: AMPUTATION DIGIT;  Surgeon: Rosetta Posner, MD;  Location: [facility];  Service: Vascular;  Laterality: Left;  left 2nd toe amputation  . Amputation Left 05/10/2013    Procedure: AMPUTATION BELOW KNEE;  Surgeon: Rosetta Posner, MD;  Location: [facility];  Service: Vascular;  Laterality: Left;  . Cataract extraction w/phaco Right 02/27/2014    Procedure: CATARACT EXTRACTION PHACO AND INTRAOCULAR LENS PLACEMENT (IOC);  Surgeon: Jasmine Guadeloupe T. Gershon Crane, MD;  Location: [facility];  Service: Ophthalmology;  Laterality: Right;  CDE:  6.77  . Cataract extraction w/phaco Left 03/20/2014    Procedure: CATARACT EXTRACTION PHACO AND INTRAOCULAR LENS PLACEMENT (IOC);  Surgeon: Jasmine Guadeloupe T. Gershon Crane, MD;  Location: [facility];  Service: Ophthalmology;  Laterality: Left;  CDE:6.98   MEDICATIONS AT HOME: Prior to Admission medications   Medication Sig Start Date End Date Taking? Authorizing Provider  aspirin EC 81 MG tablet Take 81 mg by mouth daily.   Yes Historical Provider, MD  atorvastatin (LIPITOR) 10 MG tablet Take 10 mg by mouth at bedtime.   Yes Historical Provider, MD  cetirizine (ZYRTEC) 10 MG tablet Take 10 mg by mouth daily.   Yes Historical Provider, MD  docusate sodium (COLACE) 100 MG capsule Take 100 mg by mouth daily.    Yes Historical Provider, MD  dolutegravir (TIVICAY) 50 MG tablet Take 1 tablet (50 mg total) by mouth daily. 01/10/14  Yes Truman Hayward, MD  emtricitabine-tenofovir  (TRUVADA) 200-300 MG per tablet Take 1 tablet by mouth daily. 01/10/14  Yes Truman Hayward, MD  gabapentin (NEURONTIN) 300 MG capsule Take 1 capsule (300 mg total) by mouth 3 (three) times daily. 08/17/13  Yes Elam Dutch, MD  HYDROcodone-acetaminophen (NORCO/VICODIN) 5-325 MG per tablet Take 1 tablet by mouth every 12 (twelve) hours as needed for moderate pain.    Yes Historical Provider, MD  insulin aspart (NOVOLOG FLEXPEN) 100 UNIT/ML FlexPen Inject 1-10 Units into the skin 3 (three) times daily with meals. Per sliding scale. 100-130=1 unit; 131-160=2 units; 161-190=3 units; 191-220=4 units; 221-250=5 units; 251-280= 6 units; 281-310= 7 units; 311-340= 8 units; 341-370= 9 units; 371-400= 10 units.   Yes Historical Provider, MD  insulin aspart (NOVOLOG FLEXPEN) 100 UNIT/ML FlexPen Inject 9 Units into the skin 3 (three) times daily with meals. In addition with sliding scale.   Yes Historical Provider, MD  insulin glargine (LANTUS) 100 UNIT/ML injection Inject 40 Units into the skin at bedtime.    Yes Janell Quiet, MD  lisinopril (PRINIVIL,ZESTRIL) 5 MG tablet Take 5 mg by mouth daily.   Yes Historical Provider, MD  LORazepam (ATIVAN) 0.5 MG tablet Take 0.5 mg by mouth 2 (two) times daily as needed for anxiety (take 1 tablet at bedtime and each morning as needed).    Yes Historical Provider, MD  metoprolol succinate (TOPROL-XL) 50 MG 24 hr tablet Take 50 mg by mouth daily after breakfast. Take with or immediately following a meal.   Yes Historical Provider, MD  Multiple Vitamin (DAILY VITE) TABS Take 1 tablet by mouth daily.   Yes Historical Provider, MD  Pancrelipase, Lip-Prot-Amyl, (CREON) 6000 UNITS CPEP Take 6,000-12,000 Units by mouth See admin instructions. Take 2 capsules three times a day with meals and take 1 capsule three times a day with snacks   Yes Historical Provider, MD  polyethylene glycol (MIRALAX / GLYCOLAX) packet Take 17 g by mouth daily as needed for mild constipation.   Yes  Historical Provider, MD  sertraline (ZOLOFT) 100 MG tablet Take 100 mg by mouth daily.   Yes Historical Provider, MD  hydrOXYzine (VISTARIL) 25 MG capsule Take 25 mg by mouth every 6 (six) hours as needed for itching.    Historical Provider, MD  ondansetron (ZOFRAN) 8 MG tablet Take by mouth every 8 (eight) hours as needed for nausea or vomiting.    Historical Provider, MD    FAMILY HISTORY: Family History  Problem Relation Age of Onset  . Coronary artery disease Father 18  Both parents passed away from lung cancer  SOCIAL HISTORY:  reports that she has been smoking Cigarettes.  She has a 10 pack-year smoking history. She has never used smokeless tobacco. She reports that she does  not drink alcohol or use illicit drugs.  REVIEW OF SYSTEMS:  Constitutional:  No  weight loss, night sweats,  Fevers, chills, fatigue.  HEENT: No headaches, Difficulty swallowing,Tooth/dental problems, Sore throat, No sneezing, itching, ear ache, nasal congestion, post nasal drip   Cardio-vascular: No chest pain, Orthopnea, PND, swelling in lower extremities, anasarca, dizziness, palpitations  GI: No heartburn, indigestion, abdominal pain, diarrhea, change in bowel habits  Resp: No shortness of breath at rest.  No cough, No hemoptysis, No wheezing. No chest wall deformity  Skin: no rash or lesions.  GU: no dysuria, change in color of urine, no urgency or frequency.  No flank pain.  Musculoskeletal: No joint pain or swelling.  No decreased range of motion. Left BKA performed last year.   Psych: No change in mood or affect.  No memory loss.   PHYSICAL EXAM: Blood pressure 86/56, pulse 60, temperature 97.8 F (36.6 C), temperature source Oral, resp. rate 18, height 5\' 4"  (1.626 m), weight 58.968 kg (130 lb), SpO2 99.00%.  General appearance :Awake, alert, not in any distress. Speech Clear. Not toxic Looking.  Appears older than stated age. HEENT: Atraumatic and Normocephalic, pupils equally reactive to  light, no leukoplakia or thrush. Neck: supple, no JVD. No cervical lymphadenopathy.  Chest: Good air entry bilaterally, questionable crackles in right lower lung.   CVS: RRR, S1 S2 regular, no murmurs, gallops or rubs.  MSk: No CVA tenderness, pain to palpation of lumbosacral spine. Abdomen: Bowel sounds present, Non tender and not distended with no gaurding, rigidity or rebound.  Extremities: B/L Lower Ext shows no edema, both legs are warm to touch. Left BKA, right lower extremity is darkened in color, non palpable PT and DP pulses.  Neurology: Awake alert, and oriented X 3, CN II-XII intact, Non focal Skin:No Rash, pale complected   LABS ON ADMISSION:   Recent Labs  06/19/2014 1058 06/27/2014 1121  NA 146 145  K 2.5* 2.7*  CL 120* 114*  CO2  --  16*  GLUCOSE 50* 145*  BUN 22 20  CREATININE 2.50* 2.38*  CALCIUM  --  8.4    Recent Labs  06/12/2014 1058  HGB 12.9  HCT 38.0    RADIOLOGIC STUDIES ON ADMISSION: Dg Chest Port 1 View  06/16/2014   CLINICAL DATA:  Acidosis  EXAM: PORTABLE CHEST - 1 VIEW  COMPARISON:  02/20/2013  FINDINGS: There are bilateral chronic bronchitic changes. There is no focal parenchymal opacity, pleural effusion, or pneumothorax. The heart and mediastinal contours are unremarkable.  The osseous structures are unremarkable.  IMPRESSION: No active disease.   Electronically Signed   By: Kathreen Devoid   On: 07/01/2014 14:58    ASSESSMENT AND PLAN:  Acute kidney injury Could be secondary to dehydration, medications and/or chronic disease states Creatinine 2.38, monitor. Baseline creatinine 1 - 1.26 Urinalysis ordered CMP pending.- assess fluid/electrolyte status and liver function  Order CBC- evaluate for anemia given kidney injury status Hold Lisinopril.   Give gentle IV fluids. If no overt cause of ARF will call ID for medication review (HIV & Hep C treatment)  Hypotension -?etiology -suspect this is hypovolumic-given recent Vomiting, use on  anti-hypertensives. No fever/leukocytosis to suggest sepsis, Hb stable-no hx of blood loss. -hydrate with IVF and follow -Hold Toprol and Lisinopril.  May consider restarting Toprol when BP normalizes.  Hypoglycemia -secondary to being NPO and use of Insulin. -encourage oral intake, and follow CBG's  Metabolic Acidosis Suspect secondary to renal failure; monitor.  Low CO2  without abnormal anion gap Gentle IV fluids.  Hypokalemia Supplement with potassium po tablets Check magnesium level 6:00 pm bmet.  Peripheral Vascular Disease with right foot pain Vascular surgery on-board; unable to perform arteriogram today due to decreased kidney function Blood cultures x 2 ordered  Primary amputation to be scheduled by vascular surgery Manage pain with oxycodone and morphine  Diabetes Mellitus Type 2 with current hypoglycemia Hypoglycemia likely from being NPO all day. D/C lantus and novolog home regimen.  Check POCT CBG q 2 hours until stable. Ordered SSI - S and lantus at reduced dose if cbg is above 200.  Will need to adjust when CBGs are stable. Carb modified diet  HIV Continue current medication regimen ID consulted  Hepatitis C Continue current medication regimen ID consulted  Further plan will depend as patient's clinical course evolves and further radiologic and laboratory data become available. Patient will be monitored closely.  Above noted plan was discussed with patient, she was in agreement.   DVT Prophylaxis: Prophylactic Heparin  Code Status: Full Code  Total time spent for admission equals 45 minutes.  Frederico Hamman, [place] [facility] Pager [phone]  If 7PM-7AM, please contact night-coverage [email or web address] Password [email or web address] 06/30/2014, 3:50 PM  **Disclaimer: This note may have been dictated with voice recognition software. Similar sounding words can inadvertently be transcribed and this note may contain transcription errors  which may not have been corrected upon publication of note.**  Attending Patient was seen, examined,treatment plan was discussed with the Physician extender. I have directly reviewed the clinical findings, lab, imaging studies and management of this patient in detail. I have made the necessary changes to the above noted documentation, and agree with the documentation, as recorded by the Physician extender.  Nena Alexander MD [facility].

## 2014-07-05 ENCOUNTER — Ambulatory Visit: Payer: Medicaid Other | Admitting: Infectious Disease

## 2014-07-05 DIAGNOSIS — E1059 Type 1 diabetes mellitus with other circulatory complications: Secondary | ICD-10-CM

## 2014-07-05 DIAGNOSIS — N179 Acute kidney failure, unspecified: Principal | ICD-10-CM

## 2014-07-05 DIAGNOSIS — I70209 Unspecified atherosclerosis of native arteries of extremities, unspecified extremity: Secondary | ICD-10-CM

## 2014-07-05 LAB — RENAL FUNCTION PANEL
ANION GAP: 17 — AB (ref 5–15)
Albumin: 3 g/dL — ABNORMAL LOW (ref 3.5–5.2)
BUN: 18 mg/dL (ref 6–23)
CO2: 12 mEq/L — ABNORMAL LOW (ref 19–32)
CREATININE: 1.98 mg/dL — AB (ref 0.50–1.10)
Calcium: 8.1 mg/dL — ABNORMAL LOW (ref 8.4–10.5)
Chloride: 116 mEq/L — ABNORMAL HIGH (ref 96–112)
GFR calc Af Amer: 31 mL/min — ABNORMAL LOW (ref >90)
GFR calc non Af Amer: 27 mL/min — ABNORMAL LOW (ref >90)
Glucose, Bld: 209 mg/dL — ABNORMAL HIGH (ref 70–99)
PHOSPHORUS: 2.5 mg/dL (ref 2.3–4.6)
POTASSIUM: 3 meq/L — AB (ref 3.7–5.3)
Sodium: 145 mEq/L (ref 137–147)

## 2014-07-05 LAB — BASIC METABOLIC PANEL
ANION GAP: 13 (ref 5–15)
BUN: 19 mg/dL (ref 6–23)
CHLORIDE: 115 meq/L — AB (ref 96–112)
CO2: 14 mEq/L — ABNORMAL LOW (ref 19–32)
Calcium: 8.3 mg/dL — ABNORMAL LOW (ref 8.4–10.5)
Creatinine, Ser: 2.12 mg/dL — ABNORMAL HIGH (ref 0.50–1.10)
GFR calc Af Amer: 28 mL/min — ABNORMAL LOW (ref >90)
GFR calc non Af Amer: 25 mL/min — ABNORMAL LOW (ref >90)
GLUCOSE: 214 mg/dL — AB (ref 70–99)
Potassium: 3.4 mEq/L — ABNORMAL LOW (ref 3.7–5.3)
Sodium: 142 mEq/L (ref 137–147)

## 2014-07-05 LAB — GLUCOSE, CAPILLARY
GLUCOSE-CAPILLARY: 145 mg/dL — AB (ref 70–99)
GLUCOSE-CAPILLARY: 117 mg/dL — AB (ref 70–99)
Glucose-Capillary: 150 mg/dL — ABNORMAL HIGH (ref 70–99)
Glucose-Capillary: 183 mg/dL — ABNORMAL HIGH (ref 70–99)
Glucose-Capillary: 234 mg/dL — ABNORMAL HIGH (ref 70–99)

## 2014-07-05 LAB — T-HELPER CELLS (CD4) COUNT (NOT AT ARMC)
CD4 T CELL HELPER: 35 % (ref 33–55)
CD4 T Cell Abs: 1200 /uL (ref 400–2700)

## 2014-07-05 LAB — CBC
HCT: 34 % — ABNORMAL LOW (ref 36.0–46.0)
HEMOGLOBIN: 10.9 g/dL — AB (ref 12.0–15.0)
MCH: 29.1 pg (ref 26.0–34.0)
MCHC: 32.1 g/dL (ref 30.0–36.0)
MCV: 90.9 fL (ref 78.0–100.0)
Platelets: 238 10*3/uL (ref 150–400)
RBC: 3.74 MIL/uL — ABNORMAL LOW (ref 3.87–5.11)
RDW: 19.1 % — ABNORMAL HIGH (ref 11.5–15.5)
WBC: 12.7 10*3/uL — ABNORMAL HIGH (ref 4.0–10.5)

## 2014-07-05 LAB — URINE CULTURE
Colony Count: NO GROWTH
Culture: NO GROWTH

## 2014-07-05 MED ORDER — ONDANSETRON HCL 4 MG/2ML IJ SOLN: 4.0000 mg | Freq: Once | INTRAMUSCULAR | Status: AC

## 2014-07-05 MED ORDER — POTASSIUM CHLORIDE 2 MEQ/ML IV SOLN
INTRAVENOUS | Status: DC
  Administered 2014-07-05 – 2014-07-10 (×8): via INTRAVENOUS
  Filled 2014-07-05 (×11): qty 1000

## 2014-07-05 MED ORDER — ZOLPIDEM TARTRATE 5 MG PO TABS
5.0000 mg | ORAL_TABLET | Freq: Every evening | ORAL | Status: DC | PRN
  Administered 2014-07-08: 5 mg via ORAL
  Filled 2014-07-05: qty 1

## 2014-07-05 MED ORDER — CHLORHEXIDINE GLUCONATE CLOTH 2 % EX PADS: 6.0000 | MEDICATED_PAD | Freq: Every day | CUTANEOUS | Status: DC

## 2014-07-05 MED ORDER — METOPROLOL SUCCINATE ER 25 MG PO TB24
25.0000 mg | ORAL_TABLET | Freq: Every day | ORAL | Status: DC
  Administered 2014-07-05 – 2014-07-10 (×6): 25 mg via ORAL
  Filled 2014-07-05 (×9): qty 1

## 2014-07-05 MED ORDER — MUPIROCIN 2 % EX OINT: TOPICAL_OINTMENT | Freq: Two times a day (BID) | CUTANEOUS | Status: DC

## 2014-07-05 NOTE — Progress Notes (Signed)
Patient ID: [name], female   DOB: Sep 05, 1956, 58 y.o.   MRN: [id number] Reports that she does not to get all of the today. Still with some nausea. Appreciate Dr Nena Alexander and Campbell's care.  I again discussed her right leg ischemia with the patient. She does have palpable popliteal pulse with arteriogram one year ago showing severe tibial disease. I explained to be unlikely that we would be able to find anything by arteriogram that would be correctable from a standpoint of her foot ischemia. She does not walk currently does stand and transfer. I feel that the risk of arteriography regarding her renal function far outweighs any potential benefit of finding any correctable situation with a palpable popliteal pulse and severe tibial disease. She does have rest pain but no tissue loss. I do not think she is having any muscle breakdown that is causing renal function worsening.  Fortunately her creatinine today is down to 2.0 from 2.5 yesterday.  I have recommended primary right below-knee amputation. We will schedule this for Monday, 07/31/2014. She understands that as long as she continues to have improvement of renal and overall medical function we'll proceed with this on Monday. He specifically does not want to go to rehabilitation facility was good back to her skilled nursing facility following the procedure which I feel is appropriate

## 2014-07-05 NOTE — Evaluation (Signed)
Physical Therapy Evaluation Patient Details Name: [name] MRN: [id number] DOB: Jun 30, 1956 Today's Date: 07/05/2014   History of Present Illness  Patient is a 58 y/o female admitted with right ankle pain, discoloration, coldness, N/V and AKI. PMH positive for HIV, DM type 2, Hep C, L BKA, depression, TIA and PVD. Pt scheuled for right BKA 07/12/2014.   Clinical Impression  Patient presents with functional limitations due to deficits listed in PT problem list below. Pt presents close to functional baseline and able to transfer with Min guard assist due to mild unsteadiness and pain in RLE limiting ease of mobility. Pt scheduled to have right BKA on Monday. Will need to follow up post surgery to assess for any new needs. Pt not interested in receiving any therapy. Pt would benefit from skilled PT while in the acute setting to ensure safe mobility/transfers and assess for further follow up post surgery prior to discharge.    Follow Up Recommendations Supervision/Assistance - 24 hour;SNF    Equipment Recommendations  None recommended by PT    Recommendations for Other Services       Precautions / Restrictions Precautions Precautions: Fall Restrictions Weight Bearing Restrictions: No Other Position/Activity Restrictions: pt owns prosthesis but does not wear it.      Mobility  Bed Mobility               General bed mobility comments: Sitting in recliner upon arrival.  Transfers Overall transfer level: Needs assistance   Transfers: Squat Pivot Transfers     Squat pivot transfers: Min guard     General transfer comment: Squat pivot transfer chair<-->w/c with increased time due to pain with weight bearing through RLE.  Ambulation/Gait             General Gait Details: Pt w/c bound and non ambulatory.  Stairs            Information systems manager mobility: Yes Wheelchair propulsion: Both upper extremities;Right lower  extremity Wheelchair parts: Independent Distance: 250' Wheelchair Assistance Details (indicate cue type and reason): Mod I for w/c mobility.  Modified Rankin (Stroke Patients Only)       Balance Overall balance assessment: Needs assistance   Sitting balance-Leahy Scale: Fair Sitting balance - Comments: Posterior lean noted during therex/assessment of LEs. Able to sit unsupported without difficulty.  Postural control: Posterior lean                                   Pertinent Vitals/Pain Reports pain in RLE especially in dependent position and with weightbearing. Not rated on a scale. Pt repositioned for comfort.    Home Living Family/patient expects to be discharged to:: Skilled nursing facility Living Arrangements: Other (Comment)             Home Equipment: Wheelchair - manual;Bedside commode;Hospital bed      Prior Function Level of Independence: Needs assistance   Gait / Transfers Assistance Needed: Reports being non ambulatory since April after a fall while wearing her prosthsis. Pt has no interest in walking. Uses w/c for mobility. Supervision for transfers.  ADL's / Homemaking Assistance Needed: Requires assist for ADLs, able to w/c to the dining hall for meals.  Comments: Pt enjoys playing bingo, crocheting and watching tv.     Hand Dominance   Dominant Hand: Right    Extremity/Trunk Assessment   Upper Extremity Assessment: Overall WFL for tasks assessed  Lower Extremity Assessment: Generalized weakness;RLE deficits/detail;LLE deficits/detail RLE Deficits / Details: Guarded movement of RLE- not able to obtain full knee extension, pain with AROM knee flexion and limited AROM ankle DF/PF. RLE functional for transfers.       Communication   Communication: No difficulties  Cognition Arousal/Alertness: Awake/alert Behavior During Therapy: WFL for tasks assessed/performed Overall Cognitive Status: Within Functional Limits for  tasks assessed                      General Comments      Exercises        Assessment/Plan    PT Assessment Patient needs continued PT services  PT Diagnosis Generalized weakness;Acute pain   PT Problem List Decreased strength;Pain;Decreased range of motion;Impaired sensation;Decreased activity tolerance;Decreased balance;Decreased mobility  PT Treatment Interventions Balance training;Patient/family education;Functional mobility training;Therapeutic exercise;Therapeutic activities   PT Goals (Current goals can be found in the Care Plan section) Acute Rehab PT Goals Patient Stated Goal: to go back home, "i don't need therapy." PT Goal Formulation: With patient Time For Goal Achievement: 07/19/14 Potential to Achieve Goals: Good    Frequency Min 2X/week   Barriers to discharge        Co-evaluation               End of Session Equipment Utilized During Treatment: Gait belt Activity Tolerance: Patient tolerated treatment well Patient left: in chair;with chair alarm set;with call bell/phone within reach Nurse Communication: Mobility status         Time: 1118-1140 PT Time Calculation (min): 22 min   Charges:   PT Evaluation $Initial PT Evaluation Tier I: 1 Procedure PT Treatments $Therapeutic Activity: 8-22 mins   PT G CodesCandy Sledge A 07/05/2014, 1:29 PM Candy Sledge, PT, DPT [phone]

## 2014-07-05 NOTE — Progress Notes (Signed)
PATIENT DETAILS Name: [name] Age: 58 y.o. Sex: female Date of Birth: 08/13/1956 Admit Date: 06/21/2014 Admitting Physician Evalee Mutton Kristeen Mans, MD PCP:Pcp Not In System  Subjective: Today patient complains of loss of appetite, nausea with one episode of vomiting last night. She also complains of insomnia stating that she was unable to sleep at all. The pain medication has not controlled the pain well, but the pain has not progressed. She states she has urinary urgency but there is no dysuria or change in color of urine. Patient says she has been drinking a lot of water.   PHYSICAL EXAM: Vital signs in last 24 hours: Filed Vitals:   06/18/2014 1615 06/23/2014 1616 06/09/2014 2044 07/05/14 0438  BP: 97/55  100/66 157/67  Pulse: 57  66 79  Temp: 97.4 F (36.3 C)  98.2 F (36.8 C) 99.4 F (37.4 C)  TempSrc: Oral  Oral Oral  Resp: 18  18 18   Height:  5\' 4"  (1.626 m)    Weight:  58.968 kg (130 lb)    SpO2: 97%  96% 99%   Weight change:  Filed Weights   06/09/2014 1022 06/24/2014 1616  Weight: 58.968 kg (130 lb) 58.968 kg (130 lb)   Body mass index is 22.3 kg/(m^2).  Gen Exam: Awake and alert with clear speech sitting upright in bedside recliner.  Neck: Supple, No JVD. Chest: B/L Clear.   CVS: S1 S2 Regular, no murmurs, gallops or rubs  Abdomen: soft, BS +, non tender, non distended.  Extremities: no edema, left BKA healed completely, Right foot is cold, cyanotic. Cannot palpate DP or PT pulse.  Neurologic: Non Focal.   Skin: No Rash.    Assessment/Plan: Acute kidney injury  -Likely secondary to dehydration, medications and/or chronic disease states  -creatinine down trending with IVF. Will recheck UA. -Hold Lisinopril. Give gentle IV fluids.  Metabolic Acidosis  -Suspect secondary to renal failure and ischemic leg; monitor.  -will change IVF to D5W with Bicarb-follow clinically/lytes  Peripheral Vascular Disease with right foot pain  -Vascular surgery unable to  perform arteriogram due to decreased kidney function, given that her arteriogram one year ago showing severe tibial disease, Vascular surgery feels that risk of contrast induced nephropathy outweighs any potential benefits, VVS planning on BKA on 07/13/2014. -resume Neurontin, prn Narcotics for rest pain  Hypokalemia  -likely secondary to GI loss-vomiting -will add KCL to IVF, recheck in am  Hypotension -suspect secondary to hypovolumia -resolved with IVF  HTN -BP much better and now elevated-restart Toprol, no ACEI for now -follow BP trend and adjust accordingly  Hypoglycemia  -occurred on admission-secondary to NPO status/ARF, now CBG's stable  Diabetes Mellitus Type 2  -initially hypoglycemic-now CBG's elevated-restarted Lantus at 20 units/SSI-follow CBG's  Nausea/vomiting -resolved,c/w Zofran q 8 hrs PRN  Insomnia -supportive care with Ambien -Encouraged sleep hygiene and relaxation  Depression Continue zoloft  HIV & Hepatitis C ID consulted. Per Dr. Megan Salon, "She's developed acute renal insufficiency in the setting of new right lower extremity ischemia. Tenofovir, a component of Atripla and Truvada is a well-known cause of kidney injury. Tivicay is only rarely implicated. I am not aware of any risk of kidney injury with Harvoni. Of course, she has multiple other potential causes for kidney injury including arterial insufficiency, diabetes, hypertension and lisinopril. I will hold her anti-retroviral therapy for now.  1. Hold Truvada and Tivicay 2. Check CD4 count and HIV viral load  3. Check hepatitis C  viral load "  Disposition: Remain inpatient  DVT Prophylaxis: Prophylactic Heparin   Code Status: Full code   Family Communication No family present at this time   Procedures:  None at this time  CONSULTS:  vascular surgery, ID  Time spent 40 minutes-which includes 50% of the time with face-to-face with patient/ family and coordinating care related to the  above assessment and plan.  MEDICATIONS: Scheduled Meds: . aspirin EC  81 mg Oral Daily  . atorvastatin  10 mg Oral QHS  . Chlorhexidine Gluconate Cloth  6 each Topical Q0600  . docusate sodium  100 mg Oral Daily  . heparin  5,000 Units Subcutaneous 3 times per day  . insulin aspart  0-9 Units Subcutaneous TID WC  . insulin glargine  20 Units Subcutaneous QHS  . lipase/protease/amylase  24,000 Units Oral TID WC  . loratadine  10 mg Oral Daily  . multivitamin with minerals  1 tablet Oral Daily  . mupirocin ointment  1 application Nasal BID  . potassium chloride  20 mEq Oral Daily  . sertraline  100 mg Oral Daily  . sodium chloride  3 mL Intravenous Q12H   Continuous Infusions: . sodium chloride 75 mL/hr at 07/05/14 1019   PRN Meds:.acetaminophen, acetaminophen, alum & mag hydroxide-simeth, hydrALAZINE, hydrOXYzine, lipase/protease/amylase, LORazepam, morphine injection, ondansetron, oxyCODONE, polyethylene glycol  Antibiotics: Anti-infectives   Start     Dose/Rate Route Frequency Ordered Stop   07/05/14 1000  emtricitabine-tenofovir (TRUVADA) 200-300 MG per tablet 1 tablet  Status:  Discontinued     1 tablet Oral Daily 06/09/2014 1616 06/23/2014 1839   06/26/2014 1700  dolutegravir (TIVICAY) tablet 50 mg  Status:  Discontinued     50 mg Oral Daily 06/17/2014 1616 06/18/2014 1839     Intake/Output from previous day: No intake or output data in the 24 hours ending 07/05/14 1137  LAB RESULTS: CBC  Recent Labs Lab 06/24/2014 1058 06/08/2014 1514 07/05/14 0720  WBC  --  10.2 12.7*  HGB 12.9 11.3* 10.9*  HCT 38.0 34.4* 34.0*  PLT  --  238 238  MCV  --  90.1 90.9  MCH  --  29.6 29.1  MCHC  --  32.8 32.1  RDW  --  18.8* 19.1*    Chemistries   Recent Labs Lab 06/25/2014 1058 06/12/2014 1121 07/03/2014 1530 06/29/2014 2302 07/05/14 0720  NA 146 145  --  142 145  K 2.5* 2.7*  --  3.4* 3.0*  CL 120* 114*  --  115* 116*  CO2  --  16*  --  14* 12*  GLUCOSE 50* 145*  --  214* 209*  BUN  22 20  --  19 18  CREATININE 2.50* 2.38*  --  2.12* 1.98*  CALCIUM  --  8.4  --  8.3* 8.1*  MG  --   --  2.3  --   --     CBG:  Recent Labs Lab 06/28/2014 1757 06/21/2014 2008 06/20/2014 2206 07/05/14 0017 07/05/14 0755  GLUCAP 243* 250* 192* 234* 183*    GFR Estimated Creatinine Clearance: 26.7 ml/min (by C-G formula based on Cr of 1.98).   Recent Labs  07/02/2014 1514  LIPASE 8*    MICROBIOLOGY: Recent Results (from the past 240 hour(s))  CULTURE, BLOOD (ROUTINE X 2)     Status: None   Collection Time    06/26/2014  2:40 PM      Result Value Ref Range Status   Specimen Description BLOOD LEFT HAND  Final   Special Requests BOTTLES DRAWN AEROBIC AND ANAEROBIC 2CC   Final   Culture  Setup Time     Final   Value: 06/29/2014 21:54     Performed at [facility]   Culture     Final   Value:        BLOOD CULTURE RECEIVED NO GROWTH TO DATE CULTURE WILL BE HELD FOR 5 DAYS BEFORE ISSUING A FINAL NEGATIVE REPORT     Performed at [facility]   Report Status PENDING   Incomplete  MRSA PCR SCREENING     Status: Abnormal   Collection Time    06/24/2014  4:46 PM      Result Value Ref Range Status   MRSA by PCR POSITIVE (*) NEGATIVE Final   Comment:            The GeneXpert MRSA Assay (FDA     approved for NASAL specimens     only), is one component of a     comprehensive MRSA colonization     surveillance program. It is not     intended to diagnose MRSA     infection nor to guide or     monitor treatment for     MRSA infections.     RESULT CALLED TO, READ BACK BY AND VERIFIED WITH:     Durenda Age RN 19:05 06/21/2014 ([email or web address])    RADIOLOGY STUDIES/RESULTS: Dg Chest Port 1 View 06/21/2014   CLINICAL DATA:  Acidosis  EXAM: PORTABLE CHEST - 1 VIEW  COMPARISON:  02/20/2013  FINDINGS: There are bilateral chronic bronchitic changes. There is no focal parenchymal opacity, pleural effusion, or pneumothorax. The heart and mediastinal contours are unremarkable.  The osseous  structures are unremarkable.  IMPRESSION: No active disease.   Electronically Signed   By: Kathreen Devoid   On: 07/06/2014 14:58   Audelia Acton, PA-S  [facility] Pager:[phone]  If 7PM-7AM, please contact night-coverage [email or web address] Password [email or web address] 07/05/2014, 11:37 AM   LOS: 1 day   **Disclaimer: This note may have been dictated with voice recognition software. Similar sounding words can inadvertently be transcribed and this note may contain transcription errors which may not have been corrected upon publication of note.**  Attending Patient was seen, examined,treatment plan was discussed with the Physician extender. I have directly reviewed the clinical findings, lab, imaging studies and management of this patient in detail. I have made the necessary changes to the above noted documentation, and agree with the documentation, as recorded by the Physician extender.  Nena Alexander MD [facility].

## 2014-07-05 NOTE — Progress Notes (Signed)
Inpatient Diabetes Program Recommendations  AACE/ADA: New Consensus Statement on Inpatient Glycemic Control  Target Ranges:  Prepandial:   less than 140 mg/dL      Peak postprandial:   less than 180 mg/dL (1-2 hours)      Critically ill patients:  140 - 180 mg/dL  Pager:  [phone] Hours:  8 am-10pm   Reason for Visit: Elevated glucose: 209 mg/dL  Inpatient Diabetes Program Recommendations Insulin - Basal: Consider increasing Lantus to 30 units if glucose remains elevated  Courtney Heys PhD, RN, BC-ADM Diabetes Coordinator  Office:  [phone] Team Pager:  [phone]

## 2014-07-05 NOTE — Progress Notes (Signed)
Patient ID: [name], female   DOB: [birth year], 58 y.o.   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 aspirin EC  81 mg Oral Daily  . atorvastatin  10 mg Oral QHS  . Chlorhexidine Gluconate Cloth  6 each Topical Q0600  . docusate sodium  100 mg Oral Daily  . heparin  5,000 Units Subcutaneous 3 times per day  . insulin aspart  0-9 Units Subcutaneous TID WC  . insulin glargine  20 Units Subcutaneous QHS  . lipase/protease/amylase  24,000 Units Oral TID WC  . loratadine  10 mg Oral Daily  . metoprolol succinate  25 mg Oral QPC breakfast  . multivitamin with minerals  1 tablet Oral Daily  . mupirocin ointment  1 application Nasal BID  . potassium chloride  20 mEq Oral Daily  . sertraline  100 mg Oral Daily  . sodium chloride  3 mL Intravenous Q12H    Subjective: No change in her right foot pain.  Review of Systems: Constitutional: negative Eyes: negative Ears, nose, mouth, throat, and face: negative Respiratory: negative Cardiovascular: negative Gastrointestinal: positive for nausea and vomiting, negative for abdominal pain and diarrhea Genitourinary:negative  Past Medical History  Diagnosis Date  . Depression   . Neuropathy   . Mood disorder   . TIA (transient ischemic attack)   . Pancreatitis chronic   . Chronic pain   . HIV (human immunodeficiency virus infection) 1990's    takes Norpramin nightly  . Polysubstance abuse   . Cardiomyopathy, nonischemic     EF 45%  . Peripheral vascular disease   . Asthma     "used to when I was young" (02/20/2013)  . Arthritis     "legs and arms" (02/20/2013)  . Hypercholesterolemia     takes  Lipitor nightly  . Seasonal allergies     takes Zyrtec daily  . Anemia     takes Iron pill daily  . Nausea     takes Zofran prn  . GERD (gastroesophageal reflux disease)     takes Prilosec daily  . Insomnia     takes Trazodone nightly  . Constipation     takes miralax prn and stool softener daily  . Type II diabetes mellitus     lantus 45units at bedtime  . Dysrhythmia     takes Metoprolol daily  . Weakness of both legs   . Peripheral neuropathy   . Chronic back pain   . Hepatitis C     C  . Urinary frequency   . Nocturia     History  Substance Use Topics  . Smoking status: Current Every Day Smoker -- 0.25 packs/day for 40 years    Types: Cigarettes    Last Attempt to Quit: 01/12/2013  . Smokeless tobacco: Never Used     Comment: pt states she quits yesterday and is not going to smoke if she can help it  . Alcohol Use: No     Comment: 02/20/2013 "used to have problems w/alcohol; haven't drank anything in 4 yr"    Family  History  Problem Relation Age of Onset  . Coronary artery disease Father 52    No Known Allergies  Objective: Temp:  [98.2 F (36.8 C)-99.4 F (37.4 C)] 98.5 F (36.9 C) (07/30 1431) Pulse Rate:  [66-79] 68 (07/30 1431) Resp:  [18] 18 (07/30 1431) BP: (100-157)/(66-71) 113/71 mmHg (07/30 1431) SpO2:  [94 %-99 %] 94 % (07/30 1431)  General: She is in no distress lying quietly in bed Skin: No rash Lungs: Clear Cor: Regular S1 and S2 no murmurs Abdomen: Soft and nontender Joints and extremities: Left BKA completely healed. Right foot is cold and cyanotic  Lab Results Lab Results  Component Value Date   WBC 12.7* 07/05/2014   HGB 10.9* 07/05/2014   HCT 34.0* 07/05/2014   MCV 90.9 07/05/2014   PLT 238 07/05/2014    Lab Results  Component Value Date   CREATININE 1.98* 07/05/2014   BUN 18 07/05/2014   NA 145 07/05/2014   K 3.0* 07/05/2014   CL 116* 07/05/2014   CO2 12* 07/05/2014    Lab Results  Component Value Date   ALT 17 06/29/2014    AST 25 06/20/2014   ALKPHOS 157* 06/20/2014   BILITOT <0.2* 06/16/2014      HIV 1 RNA Quant (copies/mL)  Date Value  11/21/2013 <20   08/01/2013 <20   02/13/2013 <20      CD4 T Cell Abs (/uL)  Date Value  06/26/2014 1200   11/21/2013 1140   08/01/2013 960    Microbiology: Recent Results (from the past 240 hour(s))  CULTURE, BLOOD (ROUTINE X 2)     Status: None   Collection Time    07/03/2014  2:40 PM      Result Value Ref Range Status   Specimen Description BLOOD LEFT HAND   Final   Special Requests BOTTLES DRAWN AEROBIC AND ANAEROBIC 2CC   Final   Culture  Setup Time     Final   Value: 06/30/2014 21:54     Performed at [facility]   Culture     Final   Value:        BLOOD CULTURE RECEIVED NO GROWTH TO DATE CULTURE WILL BE HELD FOR 5 DAYS BEFORE ISSUING A FINAL NEGATIVE REPORT     Performed at [facility]   Report Status PENDING   Incomplete  MRSA PCR SCREENING     Status: Abnormal   Collection Time    06/21/2014  4:46 PM      Result Value Ref Range Status   MRSA by PCR POSITIVE (*) NEGATIVE Final   Comment:            The GeneXpert MRSA Assay (FDA     approved for NASAL specimens     only), is one component of a     comprehensive MRSA colonization     surveillance program. It is not     intended to diagnose MRSA     infection nor to guide or     monitor treatment for     MRSA infections.     RESULT CALLED TO, READ BACK BY AND VERIFIED WITH:     Durenda Age RN 19:05 06/09/2014 ([email or web address])    Studies/Results: Dg Chest Port 1 View  07/03/2014   CLINICAL DATA:  Acidosis  EXAM: PORTABLE CHEST - 1 VIEW  COMPARISON:  02/20/2013  FINDINGS: There are bilateral chronic bronchitic changes. There is no focal parenchymal opacity, pleural effusion, or pneumothorax. The heart and mediastinal contours  are unremarkable.  The osseous structures are unremarkable.  IMPRESSION: No active disease.   Electronically Signed   By: Kathreen Devoid   On: 06/14/2014 14:58     Assessment: Her creatinine is down some today. I will continue to hold antiretroviral therapy until her renal function stabilizes.  Plan: 1. Hold Truvada and Tivicay 2. Check CD4 count and HIV viral load 3. Check hepatitis C viral load  Michel Bickers, MD [facility] [place] [phone] pager   [phone] cell 07/05/2014, 4:53 PM

## 2014-07-05 NOTE — Care Management Note (Signed)
    Page 1 of 1   07/13/2014     4:49:01 PM CARE MANAGEMENT NOTE 07/07/2014  Patient:  [name], [name]   Account Number:  [id number]  Date Initiated:  07/05/2014  Documentation initiated by:  Tomi Bamberger  Subjective/Objective Assessment:   dx kidney failure, 042, hx of l bka,  admit- from snf     Action/Plan:   8/3 r BKA  pt eval- rec snf   Anticipated DC Date:  07/12/2014   Anticipated DC Plan:  SKILLED NURSING FACILITY  In-house referral  Clinical Social Worker      DC Planning Services  CM consult      Choice offered to / List presented to:             Status of service:  In process, will continue to follow Medicare Important Message given?  NO (If response is "NO", the following Medicare IM given date fields will be blank) Date Medicare IM given:   Medicare IM given by:   Date Additional Medicare IM given:   Additional Medicare IM given by:    Discharge Disposition:    Per UR Regulation:  Reviewed for med. necessity/level of care/duration of stay  If discussed at [place] of Stay Meetings, dates discussed:    Comments:  07/26/2014 [place], BSN [phone] patient is for R BKA today, plan is to return to SNF.

## 2014-07-06 ENCOUNTER — Ambulatory Visit: Payer: Medicaid Other

## 2014-07-06 LAB — HCV RNA QUANT: HCV Quantitative: NOT DETECTED IU/mL — ABNORMAL LOW (ref <15)

## 2014-07-06 LAB — BASIC METABOLIC PANEL
ANION GAP: 14 (ref 5–15)
BUN: 12 mg/dL (ref 6–23)
CO2: 22 mEq/L (ref 19–32)
CREATININE: 1.69 mg/dL — AB (ref 0.50–1.10)
Calcium: 8.4 mg/dL (ref 8.4–10.5)
Chloride: 105 mEq/L (ref 96–112)
GFR calc non Af Amer: 32 mL/min — ABNORMAL LOW (ref >90)
GFR, EST AFRICAN AMERICAN: 37 mL/min — AB (ref >90)
Glucose, Bld: 135 mg/dL — ABNORMAL HIGH (ref 70–99)
Potassium: 3.1 mEq/L — ABNORMAL LOW (ref 3.7–5.3)
Sodium: 141 mEq/L (ref 137–147)

## 2014-07-06 LAB — HIV-1 RNA QUANT-NO REFLEX-BLD
HIV 1 RNA Quant: <20 copies/mL (ref <20)
HIV-1 RNA Quant, Log: <1.3 {Log} (ref <1.30)

## 2014-07-06 LAB — GLUCOSE, CAPILLARY
GLUCOSE-CAPILLARY: 129 mg/dL — AB (ref 70–99)
GLUCOSE-CAPILLARY: 192 mg/dL — AB (ref 70–99)
Glucose-Capillary: 65 mg/dL — ABNORMAL LOW (ref 70–99)
Glucose-Capillary: 68 mg/dL — ABNORMAL LOW (ref 70–99)
Glucose-Capillary: 125 mg/dL — ABNORMAL HIGH (ref 70–99)
Glucose-Capillary: 140 mg/dL — ABNORMAL HIGH (ref 70–99)

## 2014-07-06 MED ORDER — INSULIN GLARGINE 100 UNIT/ML ~~LOC~~ SOLN
15.0000 [IU] | Freq: Every day | SUBCUTANEOUS | Status: DC
  Administered 2014-07-07 – 2014-07-09 (×2): 15 [IU] via SUBCUTANEOUS
  Filled 2014-07-06 (×5): qty 0.15

## 2014-07-06 MED ORDER — DEXTROSE 5 % IV SOLN
1.5000 g | INTRAVENOUS | Status: AC
  Administered 2014-07-09: 1.5 g via INTRAVENOUS
  Filled 2014-07-06: qty 1.5

## 2014-07-06 MED ORDER — POTASSIUM CHLORIDE CRYS ER 20 MEQ PO TBCR
40.0000 meq | EXTENDED_RELEASE_TABLET | Freq: Once | ORAL | Status: AC
  Administered 2014-07-06: 40 meq via ORAL

## 2014-07-06 NOTE — Progress Notes (Signed)
Patient ID: [name], female   DOB: May 13, 1956, 58 y.o.   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 aspirin EC  81 mg Oral Daily  . atorvastatin  10 mg Oral QHS  . [START ON 07/08/2014] cefUROXime (ZINACEF)  IV  1.5 g Intravenous On Call to OR  . Chlorhexidine Gluconate Cloth  6 each Topical Q0600  . docusate sodium  100 mg Oral Daily  . heparin  5,000 Units Subcutaneous 3 times per day  . insulin aspart  0-9 Units Subcutaneous TID WC  . insulin glargine  15 Units Subcutaneous QHS  . lipase/protease/amylase  24,000 Units Oral TID WC  . loratadine  10 mg Oral Daily  . metoprolol succinate  25 mg Oral QPC breakfast  . multivitamin with minerals  1 tablet Oral Daily  . mupirocin ointment  1 application Nasal BID  . potassium chloride  20 mEq Oral Daily  . sertraline  100 mg Oral Daily  . sodium chloride  3 mL Intravenous Q12H    Subjective: She is complaining of nausea and vomiting today. She does not have any abdominal pain.  Objective: Temp:  [98.4 F (36.9 C)-99.5 F (37.5 C)] 99.5 F (37.5 C) (07/31 1531) Pulse Rate:  [67-76] 67 (07/31 1531) Resp:  [18] 18 (07/31 1531) BP: (144-145)/(70-76) 144/75 mmHg (07/31 1531) SpO2:  [90 %-91 %] 91 % (07/31 1531)  General: Drowsy but arouses and has normal conversation   Lab Results Lab Results  Component Value Date   WBC 12.7* 07/05/2014   HGB 10.9* 07/05/2014   HCT 34.0* 07/05/2014   MCV 90.9 07/05/2014   PLT 238 07/05/2014    Lab Results  Component Value Date   CREATININE 1.69* 07/06/2014   BUN 12 07/06/2014   NA 141 07/06/2014   K 3.1* 07/06/2014   CL 105 07/06/2014   CO2 22 07/06/2014      HIV 1 RNA Quant (copies/mL)  Date Value  11/21/2013 <20   08/01/2013 <20   02/13/2013 <20      CD4 T Cell Abs (/uL)  Date Value  07/02/2014 1200   11/21/2013 1140   08/01/2013 960    Assessment: Her acute renal insufficiency is improving. I will keep her off of antiretrovirals therapy until her renal function stabilizes.  Plan: 1. Continue observation off of antiretrovirals therapy 2. I will followup on Monday, August 30  Michel Bickers, MD [facility] [phone] pager   [phone] cell 07/06/2014, 3:55 PM

## 2014-07-06 NOTE — Progress Notes (Signed)
PATIENT DETAILS Name: [name] Age: 58 y.o. Sex: female Date of Birth: 04-30-1956 Admit Date: 06/28/2014 Admitting Physician Evalee Mutton Kristeen Mans, MD PCP:Pcp Not In System  Subjective: No new complaints today.   PHYSICAL EXAM: Vital signs in last 24 hours: Filed Vitals:   07/05/14 0438 07/05/14 1431 07/05/14 2122 07/06/14 0501  BP: 157/67 113/71 145/76 145/70  Pulse: 79 68 76 73  Temp: 99.4 F (37.4 C) 98.5 F (36.9 C) 98.6 F (37 C) 98.4 F (36.9 C)  TempSrc: Oral Oral Oral Oral  Resp: 18 18 18 18   Height:      Weight:      SpO2: 99% 94% 90% 91%   Weight change:  Filed Weights   06/06/2014 1022 06/13/2014 1616  Weight: 58.968 kg (130 lb) 58.968 kg (130 lb)   Body mass index is 22.3 kg/(m^2).  Gen Exam: Awake and alert with clear speech.   Neck: Supple, No JVD.   Chest: B/L Clear.   CVS: S1 S2 Regular, no murmurs.  Abdomen: soft, BS +, non tender, non distended.  Extremities: no edema, left BKA healed completely, Right foot is cold Cannot palpate DP pulse. Neurologic: Non Focal.    Assessment/Plan:   Acute kidney injury  -Likely secondary to dehydration, medications and/or chronic disease states  -creatinine down trending with IVF.  -Ordered UA.  -Continue to hold Lisinopril. Give gentle IV fluids.   Metabolic Acidosis  -Suspect secondary to renal failure and ischemic leg -Started D5W with Bicarb given 7/30 with good results, will need po bicarb upon d/c -follow clinically/lytes   Peripheral Vascular Disease with right foot pain  -Vascular surgery scheduled BKA on 07/17/2014.  -resume Neurontin, prn Narcotics for rest pain   Hypokalemia  -likely secondary to GI loss-vomiting  -7/30 added KCL to IVF, add po potassium also -recheck in am   Hypotension  -suspect secondary to hypovolumia  -resolved with IVF   HTN  -BP much better and now elevated-restart Toprol, no ACEI for now  -follow BP trend and adjust accordingly   Hypoglycemia  -occurred  on admission-secondary to NPO status/ARF, now CBG's stable-however CBG's in the 60's this am-decrease Lantus to 15 units  Diabetes Mellitus Type 2  -initially hypoglycemic-now CBG's elevated-but mild hypoglycemia recurred this am-decrease Lantus to 15 units  Nausea/vomiting  -resolved,c/w Zofran q 8 hrs PRN   Insomnia  -supportive care with Ambien  -Encouraged sleep hygiene and relaxation   Depression  Continue zoloft   HIV & Hepatitis C  ID consulted.  Per Dr. Megan Salon, "Continue to hold antiretroviral therapy until her renal function stabilizes; Hold Truvada and Tivicay, Check CD4 count and HIV viral load, Check hepatitis C viral load"  Disposition: Remain inpatient  DVT Prophylaxis: Prophylactic Heparin   Code Status: Full code   Family Communication No family present at this time  Procedures:  Right BKA scheduled for 07/13/2014  CONSULTS:  vascular surgery  MEDICATIONS: Scheduled Meds: . aspirin EC  81 mg Oral Daily  . atorvastatin  10 mg Oral QHS  . Chlorhexidine Gluconate Cloth  6 each Topical Q0600  . docusate sodium  100 mg Oral Daily  . heparin  5,000 Units Subcutaneous 3 times per day  . insulin aspart  0-9 Units Subcutaneous TID WC  . insulin glargine  20 Units Subcutaneous QHS  . lipase/protease/amylase  24,000 Units Oral TID WC  . loratadine  10 mg Oral Daily  . metoprolol succinate  25 mg Oral  QPC breakfast  . multivitamin with minerals  1 tablet Oral Daily  . mupirocin ointment  1 application Nasal BID  . potassium chloride  20 mEq Oral Daily  . potassium chloride  40 mEq Oral Once  . sertraline  100 mg Oral Daily  . sodium chloride  3 mL Intravenous Q12H   Continuous Infusions: . dextrose 5 % 1,000 mL with potassium chloride 20 mEq, sodium bicarbonate 150 mEq infusion 75 mL/hr at 07/06/14 0532   PRN Meds:.acetaminophen, acetaminophen, alum & mag hydroxide-simeth, hydrALAZINE, hydrOXYzine, lipase/protease/amylase, LORazepam, morphine injection,  ondansetron, oxyCODONE, polyethylene glycol, zolpidem  Antibiotics: Anti-infectives   Start     Dose/Rate Route Frequency Ordered Stop   07/05/14 1000  emtricitabine-tenofovir (TRUVADA) 200-300 MG per tablet 1 tablet  Status:  Discontinued     1 tablet Oral Daily 06/23/2014 1616 07/02/2014 1839   06/28/2014 1700  dolutegravir (TIVICAY) tablet 50 mg  Status:  Discontinued     50 mg Oral Daily 06/20/2014 1616 06/25/2014 1839     Intake/Output from previous day:  Intake/Output Summary (Last 24 hours) at 07/06/14 1043 Last data filed at 07/06/14 0954  Gross per 24 hour  Intake    360 ml  Output      0 ml  Net    360 ml    LAB RESULTS: CBC  Recent Labs Lab 06/20/2014 1058 07/05/2014 1514 07/05/14 0720  WBC  --  10.2 12.7*  HGB 12.9 11.3* 10.9*  HCT 38.0 34.4* 34.0*  PLT  --  238 238  MCV  --  90.1 90.9  MCH  --  29.6 29.1  MCHC  --  32.8 32.1  RDW  --  18.8* 19.1*   Chemistries   Recent Labs Lab 06/28/2014 1058 06/24/2014 1121 06/06/2014 1530 06/16/2014 2302 07/05/14 0720 07/06/14 0854  NA 146 145  --  142 145 141  K 2.5* 2.7*  --  3.4* 3.0* 3.1*  CL 120* 114*  --  115* 116* 105  CO2  --  16*  --  14* 12* 22  GLUCOSE 50* 145*  --  214* 209* 135*  BUN 22 20  --  19 18 12   CREATININE 2.50* 2.38*  --  2.12* 1.98* 1.69*  CALCIUM  --  8.4  --  8.3* 8.1* 8.4  MG  --   --  2.3  --   --   --    CBG:  Recent Labs Lab 07/05/14 1650 07/05/14 2120 07/06/14 0752 07/06/14 0814 07/06/14 0847  GLUCAP 117* 150* 68* 65* 125*   GFR Estimated Creatinine Clearance: 31.3 ml/min (by C-G formula based on Cr of 1.69).   Recent Labs  07/03/2014 1514  LIPASE 8*   MICROBIOLOGY: Recent Results (from the past 240 hour(s))  CULTURE, BLOOD (ROUTINE X 2)     Status: None   Collection Time    07/01/2014  2:40 PM      Result Value Ref Range Status   Specimen Description BLOOD LEFT HAND   Final   Special Requests BOTTLES DRAWN AEROBIC AND ANAEROBIC 2CC   Final   Culture  Setup Time     Final    Value: 07/03/2014 21:54     Performed at [facility]   Culture     Final   Value:        BLOOD CULTURE RECEIVED NO GROWTH TO DATE CULTURE WILL BE HELD FOR 5 DAYS BEFORE ISSUING A FINAL NEGATIVE REPORT     Performed at [facility]   Report Status PENDING   Incomplete  MRSA PCR SCREENING     Status: Abnormal   Collection Time    06/30/2014  4:46 PM      Result Value Ref Range Status   MRSA by PCR POSITIVE (*) NEGATIVE Final   Comment:            The GeneXpert MRSA Assay (FDA     approved for NASAL specimens     only), is one component of a     comprehensive MRSA colonization     surveillance program. It is not     intended to diagnose MRSA     infection nor to guide or     monitor treatment for     MRSA infections.     RESULT CALLED TO, READ BACK BY AND VERIFIED WITH:     Durenda Age RN 19:05 06/27/2014 ([email or web address])  CULTURE, BLOOD (ROUTINE X 2)     Status: None   Collection Time    06/19/2014  6:12 PM      Result Value Ref Range Status   Specimen Description BLOOD HAND RIGHT   Final   Special Requests BOTTLES DRAWN AEROBIC AND ANAEROBIC 2CC   Final   Culture  Setup Time     Final   Value: 07/05/2014 00:36     Performed at [facility]   Culture     Final   Value:        BLOOD CULTURE RECEIVED NO GROWTH TO DATE CULTURE WILL BE HELD FOR 5 DAYS BEFORE ISSUING A FINAL NEGATIVE REPORT     Performed at [facility]   Report Status PENDING   Incomplete  URINE CULTURE     Status: None   Collection Time    06/17/2014 10:40 PM      Result Value Ref Range Status   Specimen Description URINE, RANDOM   Final   Special Requests NONE   Final   Culture  Setup Time     Final   Value: 06/15/2014 22:57     Performed at [facility]     Final   Value: NO GROWTH     Performed at [facility]   Culture     Final   Value: NO GROWTH     Performed at [facility]   Report Status 07/05/2014 FINAL   Final    RADIOLOGY  STUDIES/RESULTS: Dg Chest Port 1 View 06/27/2014   CLINICAL DATA:  Acidosis  EXAM: PORTABLE CHEST - 1 VIEW  COMPARISON:  02/20/2013  FINDINGS: There are bilateral chronic bronchitic changes. There is no focal parenchymal opacity, pleural effusion, or pneumothorax. The heart and mediastinal contours are unremarkable.  The osseous structures are unremarkable.  IMPRESSION: No active disease.   Electronically Signed   By: Kathreen Devoid   On: 06/29/2014 14:58    Audelia Acton, PA-S [facility] Pager:[phone]  If 7PM-7AM, please contact night-coverage [email or web address] Password [email or web address] 07/06/2014, 10:43 AM   LOS: 2 days   **Disclaimer: This note may have been dictated with voice recognition software. Similar sounding words can inadvertently be transcribed and this note may contain transcription errors which may not have been corrected upon publication of note.**

## 2014-07-06 NOTE — Clinical Social Work Psychosocial (Signed)
Clinical Social Work Department BRIEF PSYCHOSOCIAL ASSESSMENT 07/06/2014  Patient:  [name],[name]     Account Number:  [id number]     Admit date:  06/11/2014  Clinical Social Worker:  CAMPBELL,JOSEPH BRYANT, LCSWA  Date/Time:  07/06/2014 03:43 PM  Referred by:  Physician  Date Referred:  07/06/2014 Referred for  SNF Placement   Other Referral:   Interview type:  Patient Other interview type:   Patient alert and oriented at time of assessment.    PSYCHOSOCIAL DATA Living Status:  FACILITY Admitted from facility:  [facility] Level of care:  Assisted Living Primary support name:  [name] Primary support relationship to patient:  SIBLING Degree of support available:   Support is good.    CURRENT CONCERNS Current Concerns  Post-Acute Placement   Other Concerns:    SOCIAL WORK ASSESSMENT / PLAN CSW met with patient at bedside to complete assessment. Patient appears tired and complains of nausea. CSW explained reason for visit. Patient states that she does not want to go to a SNF at discharge and will want to return to [facility] with HHPT. Patient states she has been to SNF before and doesn't want to go again, as she feels they cannot provide anything more than [facility]. CSW inquired about patient's feeling regarding her upcoming amputation. Patient seems indifferent about procedure and states she wishes she could have it sooner. CSW explained that if ALF states they cannot accept her back until short term SNF stay then the patient will need to look into SNF options. Patient verablized understanding. CSW will assist.   Assessment/plan status:  Psychosocial Support/Ongoing Assessment of Needs Other assessment/ plan:   Complete Fl2, Fax, PASRR   Information/referral to community resources:   CSW contact information and SNF list will be given if patient requires SNF placement.    PATIENT'S/FAMILY'S RESPONSE TO PLAN OF CARE: Patient plans to  return to [facility] at discharge. SHe states that she doesn't really move around at the ALF and that PT at a SNF is not going to really benefit her. CSW will assist.       [name] MSW, LCSWA, LCASA, [phone] 

## 2014-07-07 ENCOUNTER — Inpatient Hospital Stay (HOSPITAL_COMMUNITY): Payer: Medicaid Other

## 2014-07-07 DIAGNOSIS — F3289 Other specified depressive episodes: Secondary | ICD-10-CM

## 2014-07-07 DIAGNOSIS — F329 Major depressive disorder, single episode, unspecified: Secondary | ICD-10-CM

## 2014-07-07 LAB — CBC
HCT: 33.7 % — ABNORMAL LOW (ref 36.0–46.0)
HEMOGLOBIN: 11.3 g/dL — AB (ref 12.0–15.0)
MCH: 30.7 pg (ref 26.0–34.0)
MCHC: 33.5 g/dL (ref 30.0–36.0)
MCV: 91.6 fL (ref 78.0–100.0)
Platelets: 213 10*3/uL (ref 150–400)
RBC: 3.68 MIL/uL — ABNORMAL LOW (ref 3.87–5.11)
RDW: 18.3 % — AB (ref 11.5–15.5)
WBC: 14.2 10*3/uL — ABNORMAL HIGH (ref 4.0–10.5)

## 2014-07-07 LAB — GLUCOSE, CAPILLARY
GLUCOSE-CAPILLARY: 217 mg/dL — AB (ref 70–99)
GLUCOSE-CAPILLARY: 139 mg/dL — AB (ref 70–99)
Glucose-Capillary: 174 mg/dL — ABNORMAL HIGH (ref 70–99)
Glucose-Capillary: 199 mg/dL — ABNORMAL HIGH (ref 70–99)
Glucose-Capillary: 211 mg/dL — ABNORMAL HIGH (ref 70–99)

## 2014-07-07 LAB — BASIC METABOLIC PANEL
Anion gap: 16 — ABNORMAL HIGH (ref 5–15)
BUN: 12 mg/dL (ref 6–23)
CHLORIDE: 105 meq/L (ref 96–112)
CO2: 20 mEq/L (ref 19–32)
CREATININE: 1.44 mg/dL — AB (ref 0.50–1.10)
Calcium: 8.2 mg/dL — ABNORMAL LOW (ref 8.4–10.5)
GFR calc Af Amer: 45 mL/min — ABNORMAL LOW (ref >90)
GFR, EST NON AFRICAN AMERICAN: 39 mL/min — AB (ref >90)
Glucose, Bld: 123 mg/dL — ABNORMAL HIGH (ref 70–99)
Potassium: 2.4 mEq/L — CL (ref 3.7–5.3)
Sodium: 141 mEq/L (ref 137–147)

## 2014-07-07 MED ORDER — SENNA 8.6 MG PO TABS
2.0000 | ORAL_TABLET | Freq: Every day | ORAL | Status: DC
  Administered 2014-07-07 – 2014-07-09 (×3): 17.2 mg via ORAL
  Filled 2014-07-07 (×6): qty 2

## 2014-07-07 MED ORDER — PROMETHAZINE HCL 25 MG/ML IJ SOLN: 25.0000 mg | Freq: Four times a day (QID) | INTRAMUSCULAR | Status: DC | PRN

## 2014-07-07 MED ORDER — FLEET ENEMA 7-19 GM/118ML RE ENEM
1.0000 | ENEMA | Freq: Every day | RECTAL | Status: DC | PRN
  Filled 2014-07-07: qty 1

## 2014-07-07 MED ORDER — BISACODYL 5 MG PO TBEC: 10.0000 mg | DELAYED_RELEASE_TABLET | Freq: Every day | ORAL | Status: DC | PRN

## 2014-07-07 MED ORDER — POTASSIUM CHLORIDE 10 MEQ/100ML IV SOLN
10.0000 meq | INTRAVENOUS | Status: AC
  Administered 2014-07-07 (×4): 10 meq via INTRAVENOUS
  Filled 2014-07-07 (×4): qty 100

## 2014-07-07 MED ORDER — ONDANSETRON HCL 4 MG/2ML IJ SOLN
4.0000 mg | Freq: Three times a day (TID) | INTRAMUSCULAR | Status: DC
  Administered 2014-07-07 – 2014-07-12 (×12): 4 mg via INTRAVENOUS
  Filled 2014-07-07 (×12): qty 2

## 2014-07-07 MED ORDER — POLYETHYLENE GLYCOL 3350 17 G PO PACK
17.0000 g | PACK | Freq: Two times a day (BID) | ORAL | Status: DC
  Administered 2014-07-07 – 2014-07-09 (×5): 17 g via ORAL
  Filled 2014-07-07 (×12): qty 1

## 2014-07-07 NOTE — Progress Notes (Signed)
PATIENT DETAILS Name: [name] Age: 58 y.o. Sex: female Date of Birth: July 01, 1956 Admit Date: 06/19/2014 Admitting Physician Evalee Mutton Kristeen Mans, MD PCP:Pcp Not In System  Subjective: Had few episodes of vomiting yesterday. Complaining of b/l lower toothache (points to 2nd molar area)  Assessment/Plan: Acute kidney injury  -Likely secondary to dehydration, medications and/or chronic disease states  -creatinine down trending with IVF.  -Continue to hold Lisinopril  Metabolic Acidosis  -Suspect secondary to renal failure and ischemic leg  -Started D5W with Bicarb given 7/30 with good results-will transition to oral bicarbonate in the next few days  Nausea/vomiting  -recurred 7/31-belly is soft-will start scheduled Zofran and check Xray Abd  Hypokalemia  -likely secondary to GI loss-vomiting  -replete and recheck in am  Peripheral Vascular Disease with right foot pain  -Vascular surgery scheduled BKA on 08/01/2014.  -resume Neurontin, prn Narcotics for rest pain   Hypotension  -suspect secondary to hypovolumia  -resolved with IVF   Hypoglycemia  -occurred on admission-secondary to NPO status/ARF, CBG's stable-on Lantus 15 units with SSI  HTN  -BP much better and now elevated-restart Toprol, no ACEI for now  -follow BP trend and adjust accordingly   Diabetes Mellitus Type 2  -CBG's stable-c/w 15 units and SSI  B/L Toothache -check panoramic view  Insomnia  -supportive care with Ambien  -Encouraged sleep hygiene and relaxation   Depression  Continue zoloft   HIV & Hepatitis C  ID consulted.  Per Dr. Megan Salon, "Continue to hold antiretroviral therapy until her renal function stabilizes; Hold Truvada and Tivicay, Check CD4 count and HIV viral load, Check hepatitis C viral load"  Disposition: Remain inpatient  DVT Prophylaxis: Prophylactic Heparin   Code Status: Full code   Family Communication None at bedside  Procedures:  None  CONSULTS:  ID and vascular surgery   MEDICATIONS: Scheduled Meds: . aspirin EC  81 mg Oral Daily  . atorvastatin  10 mg Oral QHS  . [START ON 08/02/2014] cefUROXime (ZINACEF)  IV  1.5 g Intravenous On Call to OR  . Chlorhexidine Gluconate Cloth  6 each Topical Q0600  . docusate sodium  100 mg Oral Daily  . heparin  5,000 Units Subcutaneous 3 times per day  . insulin aspart  0-9 Units Subcutaneous TID WC  . insulin glargine  15 Units Subcutaneous QHS  . lipase/protease/amylase  24,000 Units Oral TID WC  . loratadine  10 mg Oral Daily  . metoprolol succinate  25 mg Oral QPC breakfast  . multivitamin with minerals  1 tablet Oral Daily  . mupirocin ointment  1 application Nasal BID  . ondansetron (ZOFRAN) IV  4 mg Intravenous TID AC  . polyethylene glycol  17 g Oral BID  . potassium chloride  10 mEq Intravenous Q1 Hr x 4  . potassium chloride  20 mEq Oral Daily  . senna  2 tablet Oral QHS  . sertraline  100 mg Oral Daily  . sodium chloride  3 mL Intravenous Q12H   Continuous Infusions: . dextrose 5 % 1,000 mL with potassium chloride 20 mEq, sodium bicarbonate 150 mEq infusion 75 mL/hr at 07/06/14 1953   PRN Meds:.acetaminophen, acetaminophen, alum & mag hydroxide-simeth, bisacodyl, hydrALAZINE, hydrOXYzine, lipase/protease/amylase, LORazepam, morphine injection, ondansetron, oxyCODONE, promethazine, sodium phosphate, zolpidem  Antibiotics: Anti-infectives   Start     Dose/Rate Route Frequency Ordered Stop   07/28/2014 0600  cefUROXime (ZINACEF) 1.5 g in dextrose 5 % 50 mL IVPB  1.5 g 100 mL/hr over 30 Minutes Intravenous On call to O.R. 07/06/14 1047 07/10/14 0559   07/05/14 1000  emtricitabine-tenofovir (TRUVADA) 200-300 MG per tablet 1 tablet  Status:  Discontinued     1 tablet Oral Daily 06/29/2014 1616 06/16/2014 1839   06/18/2014 1700  dolutegravir (TIVICAY) tablet 50 mg  Status:  Discontinued     50 mg Oral Daily 06/17/2014 1616 06/21/2014 1839       PHYSICAL EXAM: Vital signs in last 24  hours: Filed Vitals:   07/06/14 0501 07/06/14 1531 07/06/14 2124 07/07/14 0532  BP: 145/70 144/75 173/74 159/68  Pulse: 73 67 86 83  Temp: 98.4 F (36.9 C) 99.5 F (37.5 C) 99.7 F (37.6 C) 98.5 F (36.9 C)  TempSrc: Oral Oral Oral Oral  Resp: 18 18 18 18   Height:      Weight:      SpO2: 91% 91% 96% 93%    Weight change:  Filed Weights   06/06/2014 1022 06/24/2014 1616  Weight: 58.968 kg (130 lb) 58.968 kg (130 lb)   Body mass index is 22.3 kg/(m^2).   Gen Exam: Awake and alert with clear speech.   Neck: Supple, No JVD.   Chest: B/L Clear.   CVS: S1 S2 Regular, no murmurs.  Abdomen: soft, BS +, non tender, non distended.  Extremities: Extremities: no edema, left BKA healed completely, Right foot is cold Cannot palpate DP pulse. Neurologic: Non Focal.   Skin: No Rash.   Wounds: N/A.    Intake/Output from previous day:  Intake/Output Summary (Last 24 hours) at 07/07/14 1305 Last data filed at 07/07/14 0603  Gross per 24 hour  Intake 2448.75 ml  Output      0 ml  Net 2448.75 ml     LAB RESULTS: CBC  Recent Labs Lab 06/28/2014 1058 06/20/2014 1514 07/05/14 0720 07/07/14 1145  WBC  --  10.2 12.7* 14.2*  HGB 12.9 11.3* 10.9* 11.3*  HCT 38.0 34.4* 34.0* 33.7*  PLT  --  238 238 213  MCV  --  90.1 90.9 91.6  MCH  --  29.6 29.1 30.7  MCHC  --  32.8 32.1 33.5  RDW  --  18.8* 19.1* 18.3*    Chemistries   Recent Labs Lab 07/05/2014 1121 06/17/2014 1530 06/15/2014 2302 07/05/14 0720 07/06/14 0854 07/07/14 1145  NA 145  --  142 145 141 141  K 2.7*  --  3.4* 3.0* 3.1* 2.4*  CL 114*  --  115* 116* 105 105  CO2 16*  --  14* 12* 22 20  GLUCOSE 145*  --  214* 209* 135* 123*  BUN 20  --  19 18 12 12   CREATININE 2.38*  --  2.12* 1.98* 1.69* 1.44*  CALCIUM 8.4  --  8.3* 8.1* 8.4 8.2*  MG  --  2.3  --   --   --   --     CBG:  Recent Labs Lab 07/06/14 1224 07/06/14 1717 07/06/14 2220 07/07/14 0802 07/07/14 1242  GLUCAP 140* 129* 192* 217* 139*     GFR Estimated Creatinine Clearance: 36.8 ml/min (by C-G formula based on Cr of 1.44).  Coagulation profile No results found for this basename: INR, PROTIME,  in the last 168 hours  Cardiac Enzymes No results found for this basename: CK, CKMB, TROPONINI, MYOGLOBIN,  in the last 168 hours  No components found with this basename: POCBNP,  No results found for this basename: DDIMER,  in the last 72 hours  No results found for this basename: HGBA1C,  in the last 72 hours No results found for this basename: CHOL, HDL, LDLCALC, TRIG, CHOLHDL, LDLDIRECT,  in the last 72 hours No results found for this basename: TSH, T4TOTAL, FREET3, T3FREE, THYROIDAB,  in the last 72 hours No results found for this basename: VITAMINB12, FOLATE, FERRITIN, TIBC, IRON, RETICCTPCT,  in the last 72 hours  Recent Labs  07/03/2014 1514  LIPASE 8*    Urine Studies No results found for this basename: UACOL, UAPR, USPG, UPH, UTP, UGL, UKET, UBIL, UHGB, UNIT, UROB, ULEU, UEPI, UWBC, URBC, UBAC, CAST, CRYS, UCOM, BILUA,  in the last 72 hours  MICROBIOLOGY: Recent Results (from the past 240 hour(s))  CULTURE, BLOOD (ROUTINE X 2)     Status: None   Collection Time    07/01/2014  2:40 PM      Result Value Ref Range Status   Specimen Description BLOOD LEFT HAND   Final   Special Requests BOTTLES DRAWN AEROBIC AND ANAEROBIC 2CC   Final   Culture  Setup Time     Final   Value: 06/07/2014 21:54     Performed at [facility]   Culture     Final   Value:        BLOOD CULTURE RECEIVED NO GROWTH TO DATE CULTURE WILL BE HELD FOR 5 DAYS BEFORE ISSUING A FINAL NEGATIVE REPORT     Performed at [facility]   Report Status PENDING   Incomplete  MRSA PCR SCREENING     Status: Abnormal   Collection Time    06/11/2014  4:46 PM      Result Value Ref Range Status   MRSA by PCR POSITIVE (*) NEGATIVE Final   Comment:            The GeneXpert MRSA Assay (FDA     approved for NASAL specimens     only), is one  component of a     comprehensive MRSA colonization     surveillance program. It is not     intended to diagnose MRSA     infection nor to guide or     monitor treatment for     MRSA infections.     RESULT CALLED TO, READ BACK BY AND VERIFIED WITH:     Durenda Age RN 19:05 06/29/2014 ([email or web address])  CULTURE, BLOOD (ROUTINE X 2)     Status: None   Collection Time    06/13/2014  6:12 PM      Result Value Ref Range Status   Specimen Description BLOOD HAND RIGHT   Final   Special Requests BOTTLES DRAWN AEROBIC AND ANAEROBIC 2CC   Final   Culture  Setup Time     Final   Value: 07/05/2014 00:36     Performed at [facility]   Culture     Final   Value:        BLOOD CULTURE RECEIVED NO GROWTH TO DATE CULTURE WILL BE HELD FOR 5 DAYS BEFORE ISSUING A FINAL NEGATIVE REPORT     Performed at [facility]   Report Status PENDING   Incomplete  URINE CULTURE     Status: None   Collection Time    06/06/2014 10:40 PM      Result Value Ref Range Status   Specimen Description URINE, RANDOM   Final   Special Requests NONE   Final   Culture  Setup Time     Final   Value:  06/12/2014 22:57     Performed at [facility]     Final   Value: NO GROWTH     Performed at [facility]   Culture     Final   Value: NO GROWTH     Performed at [facility]   Report Status 07/05/2014 FINAL   Final    RADIOLOGY STUDIES/RESULTS: Dg Chest Port 1 View  06/24/2014   CLINICAL DATA:  Acidosis  EXAM: PORTABLE CHEST - 1 VIEW  COMPARISON:  02/20/2013  FINDINGS: There are bilateral chronic bronchitic changes. There is no focal parenchymal opacity, pleural effusion, or pneumothorax. The heart and mediastinal contours are unremarkable.  The osseous structures are unremarkable.  IMPRESSION: No active disease.   Electronically Signed   By: Kathreen Devoid   On: 06/28/2014 14:58    Oren Binet, MD  [facility] Pager:[phone]  If 7PM-7AM, please contact  night-coverage [email or web address] Password [email or web address] 07/07/2014, 1:05 PM   LOS: 3 days   **Disclaimer: This note may have been dictated with voice recognition software. Similar sounding words can inadvertently be transcribed and this note may contain transcription errors which may not have been corrected upon publication of note.**

## 2014-07-07 DEATH — deceased

## 2014-07-08 DIAGNOSIS — I999 Unspecified disorder of circulatory system: Secondary | ICD-10-CM

## 2014-07-08 LAB — BASIC METABOLIC PANEL
Anion gap: 25 — ABNORMAL HIGH (ref 5–15)
Anion gap: 17 — ABNORMAL HIGH (ref 5–15)
BUN: 23 mg/dL (ref 6–23)
BUN: 26 mg/dL — AB (ref 6–23)
CO2: 14 mEq/L — ABNORMAL LOW (ref 19–32)
CO2: 20 meq/L (ref 19–32)
CREATININE: 1.54 mg/dL — AB (ref 0.50–1.10)
Calcium: 8.6 mg/dL (ref 8.4–10.5)
Calcium: 8.9 mg/dL (ref 8.4–10.5)
Chloride: 103 mEq/L (ref 96–112)
Chloride: 107 mEq/L (ref 96–112)
Creatinine, Ser: 1.53 mg/dL — ABNORMAL HIGH (ref 0.50–1.10)
GFR calc Af Amer: 42 mL/min — ABNORMAL LOW (ref >90)
GFR calc Af Amer: 42 mL/min — ABNORMAL LOW (ref >90)
GFR calc non Af Amer: 36 mL/min — ABNORMAL LOW (ref >90)
GFR calc non Af Amer: 36 mL/min — ABNORMAL LOW (ref >90)
GLUCOSE: 236 mg/dL — AB (ref 70–99)
GLUCOSE: 176 mg/dL — AB (ref 70–99)
POTASSIUM: 3.4 meq/L — AB (ref 3.7–5.3)
POTASSIUM: 3.2 meq/L — AB (ref 3.7–5.3)
SODIUM: 144 meq/L (ref 137–147)
Sodium: 142 mEq/L (ref 137–147)

## 2014-07-08 LAB — GLUCOSE, CAPILLARY
GLUCOSE-CAPILLARY: 186 mg/dL — AB (ref 70–99)
GLUCOSE-CAPILLARY: 111 mg/dL — AB (ref 70–99)
Glucose-Capillary: 182 mg/dL — ABNORMAL HIGH (ref 70–99)
Glucose-Capillary: 177 mg/dL — ABNORMAL HIGH (ref 70–99)

## 2014-07-08 LAB — LACTIC ACID, PLASMA: Lactic Acid, Venous: 1.2 mmol/L (ref 0.5–2.2)

## 2014-07-08 MED ORDER — POTASSIUM CHLORIDE ER 10 MEQ PO TBCR
80.0000 meq | EXTENDED_RELEASE_TABLET | Freq: Once | ORAL | Status: DC
  Filled 2014-07-08: qty 8

## 2014-07-08 MED ORDER — POTASSIUM CHLORIDE CRYS ER 20 MEQ PO TBCR: 20.0000 meq | EXTENDED_RELEASE_TABLET | Freq: Every day | ORAL | Status: DC

## 2014-07-08 MED ORDER — POTASSIUM CHLORIDE CRYS ER 20 MEQ PO TBCR
30.0000 meq | EXTENDED_RELEASE_TABLET | Freq: Once | ORAL | Status: AC
  Administered 2014-07-08: 30 meq via ORAL
  Filled 2014-07-08: qty 1

## 2014-07-08 MED ORDER — POTASSIUM CHLORIDE CRYS ER 20 MEQ PO TBCR
40.0000 meq | EXTENDED_RELEASE_TABLET | Freq: Two times a day (BID) | ORAL | Status: DC
  Administered 2014-07-08 – 2014-07-09 (×3): 40 meq via ORAL
  Filled 2014-07-08 (×4): qty 2

## 2014-07-08 MED ORDER — POTASSIUM CHLORIDE 10 MEQ/100ML IV SOLN
10.0000 meq | INTRAVENOUS | Status: AC
  Administered 2014-07-08 (×2): 10 meq via INTRAVENOUS
  Filled 2014-07-08 (×2): qty 100

## 2014-07-08 NOTE — Progress Notes (Signed)
Pt was ordered for 80 meq of kcl x1 by Dr Juanda Crumble from vascular surgery. Called to Jonette Eva, NP as pt had  K+3.4 this am. Order received to give 30 meq kcl x1 and d/cd order for 80 meq.

## 2014-07-08 NOTE — Progress Notes (Signed)
PATIENT DETAILS Name: [name] Age: 58 y.o. Sex: female Date of Birth: [birth year] Admit Date: 06/22/2014 Admitting Physician Jonetta Osgood, MD PCP:Pcp Not In System  Subjective: Much better today-no vomiting this am. Tooth ache has resolved.  Assessment/Plan: Acute kidney injury  -Likely secondary to dehydration, medications and/or chronic disease states  -creatinine down trending with IVF.  -Continue to hold Lisinopril  Metabolic Acidosis  -Suspect secondary to renal failure and ischemic leg  -c/w  D5W with Bicarb-transition to oral bicarb when able  Nausea/vomiting  -?etiology-belly is soft, Xray abd done on 8/1-neg for SBO, c/w Zofran-much better today  Hypokalemia  -likely secondary to GI loss-vomiting  -replete and recheck in am  Peripheral Vascular Disease with right foot pain  -Vascular surgery scheduled BKA on 07/27/2014.  -resume Neurontin, prn Narcotics for rest pain   Hypotension  -suspect secondary to hypovolumia  -resolved with IVF   Hypoglycemia  -occurred on admission-secondary to NPO status/ARF, CBG's stable-on Lantus 15 units with SSI  HTN  -controlled with Toprol, no ACEI for now  -follow BP trend and adjust accordingly   Diabetes Mellitus Type 2  -CBG's stable-c/w 15 units and SSI  B/L Toothache -resolved, if recurrs check panoromic xray  Insomnia  -supportive care with Ambien  -Encouraged sleep hygiene and relaxation   Depression  Continue zoloft   HIV & Hepatitis C  ID consulted.  Per Dr. Megan Salon, "Continue to hold antiretroviral therapy until her renal function stabilizes; Hold Truvada and Tivicay, Check CD4 count and HIV viral load, Check hepatitis C viral load"  Disposition: Remain inpatient  DVT Prophylaxis: Prophylactic Heparin   Code Status: Full code   Family Communication None at bedside  Procedures:  None  CONSULTS:  ID and vascular surgery   MEDICATIONS: Scheduled Meds: . aspirin EC  81 mg  Oral Daily  . atorvastatin  10 mg Oral QHS  . [START ON 07/11/2014] cefUROXime (ZINACEF)  IV  1.5 g Intravenous On Call to OR  . docusate sodium  100 mg Oral Daily  . heparin  5,000 Units Subcutaneous 3 times per day  . insulin aspart  0-9 Units Subcutaneous TID WC  . insulin glargine  15 Units Subcutaneous QHS  . lipase/protease/amylase  24,000 Units Oral TID WC  . loratadine  10 mg Oral Daily  . metoprolol succinate  25 mg Oral QPC breakfast  . multivitamin with minerals  1 tablet Oral Daily  . mupirocin ointment  1 application Nasal BID  . ondansetron (ZOFRAN) IV  4 mg Intravenous TID AC  . polyethylene glycol  17 g Oral BID  . potassium chloride  40 mEq Oral BID  . senna  2 tablet Oral QHS  . sertraline  100 mg Oral Daily  . sodium chloride  3 mL Intravenous Q12H   Continuous Infusions: . dextrose 5 % 1,000 mL with potassium chloride 20 mEq, sodium bicarbonate 150 mEq infusion 75 mL/hr at 07/08/14 0600   PRN Meds:.acetaminophen, acetaminophen, alum & mag hydroxide-simeth, bisacodyl, hydrALAZINE, hydrOXYzine, lipase/protease/amylase, LORazepam, morphine injection, ondansetron, oxyCODONE, promethazine, sodium phosphate, zolpidem  Antibiotics: Anti-infectives   Start     Dose/Rate Route Frequency Ordered Stop   07/26/2014 0600  cefUROXime (ZINACEF) 1.5 g in dextrose 5 % 50 mL IVPB     1.5 g 100 mL/hr over 30 Minutes Intravenous On call to O.R. 07/06/14 1047 07/10/14 0559   07/05/14 1000  emtricitabine-tenofovir (TRUVADA) 200-300 MG per tablet 1 tablet  Status:  Discontinued     1 tablet Oral Daily 06/22/2014 1616 07/01/2014 1839   06/08/2014 1700  dolutegravir (TIVICAY) tablet 50 mg  Status:  Discontinued     50 mg Oral Daily 06/24/2014 1616 06/08/2014 1839       PHYSICAL EXAM: Vital signs in last 24 hours: Filed Vitals:   07/07/14 2256 07/08/14 0616 07/08/14 0817 07/08/14 1329  BP: 155/77 114/63 120/70 106/68  Pulse: 78 70 6 71  Temp:  98.4 F (36.9 C)  97.5 F (36.4 C)  TempSrc:   Oral  Oral  Resp:  16  14  Height:      Weight:      SpO2:  92%  88%    Weight change:  Filed Weights   06/30/2014 1022 07/03/2014 1616  Weight: 58.968 kg (130 lb) 58.968 kg (130 lb)   Body mass index is 22.3 kg/(m^2).   Gen Exam: Awake and alert with clear speech.   Neck: Supple, No JVD.   Chest: B/L Clear.  No rhonchi CVS: S1 S2 Regular, no murmurs.  Abdomen: soft, BS +, non tender, non distended.  Extremities: Extremities: no edema, left BKA healed completely, Right foot is cold Cannot palpate DP pulse. Neurologic: Non Focal.   Skin: No Rash.   Wounds: N/A.    Intake/Output from previous day:  Intake/Output Summary (Last 24 hours) at 07/08/14 1336 Last data filed at 07/08/14 0600  Gross per 24 hour  Intake   1140 ml  Output    300 ml  Net    840 ml     LAB RESULTS: CBC  Recent Labs Lab 07/05/2014 1058 06/13/2014 1514 07/05/14 0720 07/07/14 1145  WBC  --  10.2 12.7* 14.2*  HGB 12.9 11.3* 10.9* 11.3*  HCT 38.0 34.4* 34.0* 33.7*  PLT  --  238 238 213  MCV  --  90.1 90.9 91.6  MCH  --  29.6 29.1 30.7  MCHC  --  32.8 32.1 33.5  RDW  --  18.8* 19.1* 18.3*    Chemistries   Recent Labs Lab 06/29/2014 1530 07/02/2014 2302 07/05/14 0720 07/06/14 0854 07/07/14 1145 07/08/14 0505  NA  --  142 145 141 141 142  K  --  3.4* 3.0* 3.1* 2.4* 3.4*  CL  --  115* 116* 105 105 103  CO2  --  14* 12* 22 20 14*  GLUCOSE  --  214* 209* 135* 123* 236*  BUN  --  19 18 12 12 23   CREATININE  --  2.12* 1.98* 1.69* 1.44* 1.54*  CALCIUM  --  8.3* 8.1* 8.4 8.2* 8.6  MG 2.3  --   --   --   --   --     CBG:  Recent Labs Lab 07/07/14 1721 07/07/14 2009 07/07/14 2127 07/08/14 0818 07/08/14 1147  GLUCAP 174* 199* 211* 186* 182*    GFR Estimated Creatinine Clearance: 34.4 ml/min (by C-G formula based on Cr of 1.54).  Coagulation profile No results found for this basename: INR, PROTIME,  in the last 168 hours  Cardiac Enzymes No results found for this basename: CK, CKMB,  TROPONINI, MYOGLOBIN,  in the last 168 hours  No components found with this basename: POCBNP,  No results found for this basename: DDIMER,  in the last 72 hours No results found for this basename: HGBA1C,  in the last 72 hours No results found for this basename: CHOL, HDL, LDLCALC, TRIG, CHOLHDL, LDLDIRECT,  in the last 72 hours No results found  for this basename: TSH, T4TOTAL, FREET3, T3FREE, THYROIDAB,  in the last 72 hours No results found for this basename: VITAMINB12, FOLATE, FERRITIN, TIBC, IRON, RETICCTPCT,  in the last 72 hours No results found for this basename: LIPASE, AMYLASE,  in the last 72 hours  Urine Studies No results found for this basename: UACOL, UAPR, USPG, UPH, UTP, UGL, UKET, UBIL, UHGB, UNIT, UROB, ULEU, UEPI, UWBC, URBC, UBAC, CAST, CRYS, UCOM, BILUA,  in the last 72 hours  MICROBIOLOGY: Recent Results (from the past 240 hour(s))  CULTURE, BLOOD (ROUTINE X 2)     Status: None   Collection Time    06/10/2014  2:40 PM      Result Value Ref Range Status   Specimen Description BLOOD LEFT HAND   Final   Special Requests BOTTLES DRAWN AEROBIC AND ANAEROBIC 2CC   Final   Culture  Setup Time     Final   Value: 06/24/2014 21:54     Performed at [facility]   Culture     Final   Value:        BLOOD CULTURE RECEIVED NO GROWTH TO DATE CULTURE WILL BE HELD FOR 5 DAYS BEFORE ISSUING A FINAL NEGATIVE REPORT     Performed at [facility]   Report Status PENDING   Incomplete  MRSA PCR SCREENING     Status: Abnormal   Collection Time    06/16/2014  4:46 PM      Result Value Ref Range Status   MRSA by PCR POSITIVE (*) NEGATIVE Final   Comment:            The GeneXpert MRSA Assay (FDA     approved for NASAL specimens     only), is one component of a     comprehensive MRSA colonization     surveillance program. It is not     intended to diagnose MRSA     infection nor to guide or     monitor treatment for     MRSA infections.     RESULT CALLED TO, READ BACK  BY AND VERIFIED WITH:     Durenda Age RN 19:05 06/13/2014 ([email or web address])  CULTURE, BLOOD (ROUTINE X 2)     Status: None   Collection Time    06/27/2014  6:12 PM      Result Value Ref Range Status   Specimen Description BLOOD HAND RIGHT   Final   Special Requests BOTTLES DRAWN AEROBIC AND ANAEROBIC 2CC   Final   Culture  Setup Time     Final   Value: 07/05/2014 00:36     Performed at [facility]   Culture     Final   Value:        BLOOD CULTURE RECEIVED NO GROWTH TO DATE CULTURE WILL BE HELD FOR 5 DAYS BEFORE ISSUING A FINAL NEGATIVE REPORT     Performed at [facility]   Report Status PENDING   Incomplete  URINE CULTURE     Status: None   Collection Time    06/27/2014 10:40 PM      Result Value Ref Range Status   Specimen Description URINE, RANDOM   Final   Special Requests NONE   Final   Culture  Setup Time     Final   Value: 06/25/2014 22:57     Performed at [facility]     Final   Value: NO GROWTH     Performed at  [facility]     Final   Value: NO GROWTH     Performed at [facility]   Report Status 07/05/2014 FINAL   Final    RADIOLOGY STUDIES/RESULTS: Dg Chest Port 1 View  06/30/2014   CLINICAL DATA:  Acidosis  EXAM: PORTABLE CHEST - 1 VIEW  COMPARISON:  02/20/2013  FINDINGS: There are bilateral chronic bronchitic changes. There is no focal parenchymal opacity, pleural effusion, or pneumothorax. The heart and mediastinal contours are unremarkable.  The osseous structures are unremarkable.  IMPRESSION: No active disease.   Electronically Signed   By: Kathreen Devoid   On: 07/02/2014 14:58    Oren Binet, MD  [facility] Pager:[phone]  If 7PM-7AM, please contact night-coverage [email or web address] Password [email or web address] 07/08/2014, 1:36 PM   LOS: 4 days   **Disclaimer: This note may have been dictated with voice recognition software. Similar sounding words can inadvertently be transcribed and this note may contain  transcription errors which may not have been corrected upon publication of note.**

## 2014-07-08 NOTE — Progress Notes (Signed)
K Schorr , NP notified of blood sugar 111 mg/dl, pt is going for surgery tomorrow. Pt has order for 15 units of lantus and sch heparin. Order received to give heparin for tonight and hold Insulin lantus tonight and heparin in am. Will cont to monitor.

## 2014-07-08 NOTE — Progress Notes (Addendum)
[facility]  Subjective  - nausea   Objective 155/77 78 98.2 F (36.8 C) (Oral) 16 94%  Intake/Output Summary (Last 24 hours) at 07/08/14 0553 Last data filed at 07/07/14 2200  Gross per 24 hour  Intake 1368.75 ml  Output    300 ml  Net 1068.75 ml     Assessment/Planning: BKA right leg tomorrow by Dr Donnetta Hutching as long as stable today Hypokalemia 80- meq PO ordered now, recheck later today per primary team Renal function improved since admission Nausea vomiting per primary team  [name] E 07/08/2014 5:53 AM --  Laboratory Lab Results:  Recent Labs  07/05/14 0720 07/07/14 1145  WBC 12.7* 14.2*  HGB 10.9* 11.3*  HCT 34.0* 33.7*  PLT 238 213   BMET  Recent Labs  07/06/14 0854 07/07/14 1145  NA 141 141  K 3.1* 2.4*  CL 105 105  CO2 22 20  GLUCOSE 135* 123*  BUN 12 12  CREATININE 1.69* 1.44*  CALCIUM 8.4 8.2*    COAG Lab Results  Component Value Date   INR 1.01 01/10/2014   INR 0.93 05/09/2013   INR 0.96 02/20/2013   No results found for this basename: PTT

## 2014-07-09 ENCOUNTER — Telehealth: Payer: Self-pay | Admitting: Vascular Surgery

## 2014-07-09 ENCOUNTER — Encounter (HOSPITAL_COMMUNITY): Admission: RE | Disposition: E | Payer: Self-pay | Source: Ambulatory Visit | Attending: Internal Medicine

## 2014-07-09 ENCOUNTER — Encounter (HOSPITAL_COMMUNITY): Payer: Self-pay | Admitting: Certified Registered Nurse Anesthetist

## 2014-07-09 ENCOUNTER — Inpatient Hospital Stay (HOSPITAL_COMMUNITY): Payer: Medicaid Other | Admitting: Anesthesiology

## 2014-07-09 ENCOUNTER — Encounter (HOSPITAL_COMMUNITY): Payer: Medicaid Other | Admitting: Anesthesiology

## 2014-07-09 DIAGNOSIS — I70229 Atherosclerosis of native arteries of extremities with rest pain, unspecified extremity: Secondary | ICD-10-CM

## 2014-07-09 HISTORY — PX: AMPUTATION: SHX166

## 2014-07-09 LAB — CBC
HCT: 34.7 % — ABNORMAL LOW (ref 36.0–46.0)
Hemoglobin: 11.4 g/dL — ABNORMAL LOW (ref 12.0–15.0)
MCH: 30.1 pg (ref 26.0–34.0)
MCHC: 32.9 g/dL (ref 30.0–36.0)
MCV: 91.6 fL (ref 78.0–100.0)
Platelets: 245 10*3/uL (ref 150–400)
RBC: 3.79 MIL/uL — AB (ref 3.87–5.11)
RDW: 18.5 % — ABNORMAL HIGH (ref 11.5–15.5)
WBC: 19.2 10*3/uL — AB (ref 4.0–10.5)

## 2014-07-09 LAB — BASIC METABOLIC PANEL
Anion gap: 18 — ABNORMAL HIGH (ref 5–15)
BUN: 23 mg/dL (ref 6–23)
CHLORIDE: 105 meq/L (ref 96–112)
CO2: 18 meq/L — AB (ref 19–32)
CREATININE: 1.32 mg/dL — AB (ref 0.50–1.10)
Calcium: 8.5 mg/dL (ref 8.4–10.5)
GFR calc Af Amer: 50 mL/min — ABNORMAL LOW (ref >90)
GFR calc non Af Amer: 43 mL/min — ABNORMAL LOW (ref >90)
Glucose, Bld: 117 mg/dL — ABNORMAL HIGH (ref 70–99)
Potassium: 3.3 mEq/L — ABNORMAL LOW (ref 3.7–5.3)
Sodium: 141 mEq/L (ref 137–147)

## 2014-07-09 LAB — GLUCOSE, CAPILLARY
GLUCOSE-CAPILLARY: 162 mg/dL — AB (ref 70–99)
GLUCOSE-CAPILLARY: 207 mg/dL — AB (ref 70–99)
GLUCOSE-CAPILLARY: 251 mg/dL — AB (ref 70–99)
Glucose-Capillary: 182 mg/dL — ABNORMAL HIGH (ref 70–99)
Glucose-Capillary: 160 mg/dL — ABNORMAL HIGH (ref 70–99)

## 2014-07-09 LAB — TYPE AND SCREEN
ABO/RH(D): A POS
Antibody Screen: NEGATIVE

## 2014-07-09 LAB — HCG, SERUM, QUALITATIVE: PREG SERUM: NEGATIVE

## 2014-07-09 SURGERY — AMPUTATION BELOW KNEE
Anesthesia: General | Site: Leg Lower | Laterality: Right

## 2014-07-09 MED ORDER — LACTATED RINGERS IV SOLN
INTRAVENOUS | Status: DC | PRN
  Administered 2014-07-09: 10:00:00 via INTRAVENOUS

## 2014-07-09 MED ORDER — ONDANSETRON HCL 4 MG/2ML IJ SOLN
INTRAMUSCULAR | Status: DC | PRN
  Administered 2014-07-09: 4 mg via INTRAVENOUS

## 2014-07-09 MED ORDER — PROPOFOL 10 MG/ML IV BOLUS
INTRAVENOUS | Status: DC | PRN
  Administered 2014-07-09: 100 mg via INTRAVENOUS
  Administered 2014-07-09: 10 mg via INTRAVENOUS

## 2014-07-09 MED ORDER — OXYCODONE HCL 5 MG PO TABS: 5.0000 mg | ORAL_TABLET | Freq: Once | ORAL | Status: DC | PRN

## 2014-07-09 MED ORDER — OXYCODONE HCL 5 MG/5ML PO SOLN: 5.0000 mg | Freq: Once | ORAL | Status: DC | PRN

## 2014-07-09 MED ORDER — FENTANYL CITRATE 0.05 MG/ML IJ SOLN
25.0000 ug | INTRAMUSCULAR | Status: DC | PRN
  Administered 2014-07-09: 50 ug via INTRAVENOUS
  Administered 2014-07-09: 25 ug via INTRAVENOUS

## 2014-07-09 MED ORDER — 0.9 % SODIUM CHLORIDE (POUR BTL) OPTIME
TOPICAL | Status: DC | PRN
  Administered 2014-07-09: 1000 mL

## 2014-07-09 MED ORDER — POTASSIUM CHLORIDE CRYS ER 20 MEQ PO TBCR: 20.0000 meq | EXTENDED_RELEASE_TABLET | Freq: Every day | ORAL | Status: DC | PRN

## 2014-07-09 MED ORDER — LIDOCAINE-PRILOCAINE 2.5-2.5 % EX CREA: 1.0000 "application " | TOPICAL_CREAM | Freq: Once | CUTANEOUS | Status: DC

## 2014-07-09 MED ORDER — FENTANYL CITRATE 0.05 MG/ML IJ SOLN
INTRAMUSCULAR | Status: DC | PRN
  Administered 2014-07-09 (×2): 50 ug via INTRAVENOUS
  Administered 2014-07-09: 25 ug via INTRAVENOUS
  Administered 2014-07-09: 50 ug via INTRAVENOUS
  Administered 2014-07-09 (×3): 25 ug via INTRAVENOUS

## 2014-07-09 MED ORDER — POTASSIUM CHLORIDE 10 MEQ/100ML IV SOLN
10.0000 meq | INTRAVENOUS | Status: AC
  Administered 2014-07-09 (×3): 10 meq via INTRAVENOUS
  Filled 2014-07-09 (×3): qty 100

## 2014-07-09 MED ORDER — POTASSIUM CHLORIDE 10 MEQ/100ML IV SOLN
10.0000 meq | INTRAVENOUS | Status: AC
  Filled 2014-07-09 (×2): qty 100

## 2014-07-09 MED ORDER — MAGNESIUM SULFATE 40 MG/ML IJ SOLN
2.0000 g | Freq: Every day | INTRAMUSCULAR | Status: DC | PRN
  Filled 2014-07-09: qty 50

## 2014-07-09 MED ORDER — PROPOFOL 10 MG/ML IV BOLUS
INTRAVENOUS | Status: AC
  Filled 2014-07-09: qty 20

## 2014-07-09 MED ORDER — PHENOL 1.4 % MT LIQD
1.0000 | OROMUCOSAL | Status: DC | PRN
  Filled 2014-07-09: qty 177

## 2014-07-09 MED ORDER — LIDOCAINE HCL (CARDIAC) 20 MG/ML IV SOLN
INTRAVENOUS | Status: DC | PRN
  Administered 2014-07-09: 40 mg via INTRAVENOUS

## 2014-07-09 MED ORDER — CEFUROXIME SODIUM 1.5 G IJ SOLR
1.5000 g | Freq: Two times a day (BID) | INTRAMUSCULAR | Status: AC
  Administered 2014-07-09 – 2014-07-10 (×2): 1.5 g via INTRAVENOUS
  Filled 2014-07-09 (×2): qty 1.5

## 2014-07-09 MED ORDER — FENTANYL CITRATE 0.05 MG/ML IJ SOLN
INTRAMUSCULAR | Status: AC
  Filled 2014-07-09: qty 2

## 2014-07-09 MED ORDER — ONDANSETRON HCL 4 MG/2ML IJ SOLN
INTRAMUSCULAR | Status: AC
  Filled 2014-07-09: qty 2

## 2014-07-09 MED ORDER — METOPROLOL TARTRATE 1 MG/ML IV SOLN: 2.0000 mg | INTRAVENOUS | Status: DC | PRN

## 2014-07-09 MED ORDER — TRAZODONE HCL 100 MG PO TABS
100.0000 mg | ORAL_TABLET | Freq: Once | ORAL | Status: DC
  Filled 2014-07-09: qty 1

## 2014-07-09 MED ORDER — ONDANSETRON HCL 4 MG/2ML IJ SOLN: 4.0000 mg | Freq: Once | INTRAMUSCULAR | Status: DC | PRN

## 2014-07-09 MED ORDER — GUAIFENESIN-DM 100-10 MG/5ML PO SYRP
15.0000 mL | ORAL_SOLUTION | ORAL | Status: DC | PRN
  Filled 2014-07-09: qty 15

## 2014-07-09 MED ORDER — OXYMETAZOLINE HCL 0.05 % NA SOLN
2.0000 | Freq: Once | NASAL | Status: DC
  Filled 2014-07-09: qty 15

## 2014-07-09 MED ORDER — MIDAZOLAM HCL 2 MG/2ML IJ SOLN
INTRAMUSCULAR | Status: AC
  Filled 2014-07-09: qty 2

## 2014-07-09 MED ORDER — PANTOPRAZOLE SODIUM 40 MG PO TBEC
40.0000 mg | DELAYED_RELEASE_TABLET | Freq: Every day | ORAL | Status: DC
  Filled 2014-07-09 (×2): qty 1

## 2014-07-09 MED ORDER — FENTANYL CITRATE 0.05 MG/ML IJ SOLN
INTRAMUSCULAR | Status: AC
  Filled 2014-07-09: qty 5

## 2014-07-09 MED ORDER — LABETALOL HCL 5 MG/ML IV SOLN
10.0000 mg | INTRAVENOUS | Status: DC | PRN
  Filled 2014-07-09: qty 4

## 2014-07-09 MED ORDER — LIDOCAINE HCL (CARDIAC) 20 MG/ML IV SOLN
INTRAVENOUS | Status: AC
  Filled 2014-07-09: qty 5

## 2014-07-09 SURGICAL SUPPLY — 49 items
BANDAGE ELASTIC 4 VELCRO ST LF (GAUZE/BANDAGES/DRESSINGS) ×3 IMPLANT
BANDAGE ELASTIC 6 VELCRO ST LF (GAUZE/BANDAGES/DRESSINGS) ×3 IMPLANT
BANDAGE ESMARK 6X9 LF (GAUZE/BANDAGES/DRESSINGS) IMPLANT
BNDG ESMARK 6X9 LF (GAUZE/BANDAGES/DRESSINGS)
BNDG GAUZE ELAST 4 BULKY (GAUZE/BANDAGES/DRESSINGS) ×3 IMPLANT
CANISTER SUCTION 2500CC (MISCELLANEOUS) ×3 IMPLANT
CLIP LIGATING EXTRA MED SLVR (CLIP) ×3 IMPLANT
CLIP LIGATING EXTRA SM BLUE (MISCELLANEOUS) ×3 IMPLANT
COVER SURGICAL LIGHT HANDLE (MISCELLANEOUS) ×3 IMPLANT
CUFF TOURNIQUET SINGLE 34IN LL (TOURNIQUET CUFF) IMPLANT
CUFF TOURNIQUET SINGLE 44IN (TOURNIQUET CUFF) IMPLANT
DRAIN SNY 10X20 3/4 PERF (WOUND CARE) IMPLANT
DRAPE ORTHO SPLIT 77X108 STRL (DRAPES) ×4
DRAPE PROXIMA HALF (DRAPES) ×3 IMPLANT
DRAPE SURG ORHT 6 SPLT 77X108 (DRAPES) ×2 IMPLANT
ELECT REM PT RETURN 9FT ADLT (ELECTROSURGICAL) ×3
ELECTRODE REM PT RTRN 9FT ADLT (ELECTROSURGICAL) ×1 IMPLANT
EVACUATOR SILICONE 100CC (DRAIN) IMPLANT
GAUZE SPONGE 4X4 12PLY STRL (GAUZE/BANDAGES/DRESSINGS) ×3 IMPLANT
GAUZE XEROFORM 5X9 LF (GAUZE/BANDAGES/DRESSINGS) ×3 IMPLANT
GLOVE BIO SURGEON STRL SZ 6.5 (GLOVE) ×2 IMPLANT
GLOVE BIO SURGEONS STRL SZ 6.5 (GLOVE) ×1
GLOVE BIOGEL PI IND STRL 6.5 (GLOVE) ×2 IMPLANT
GLOVE BIOGEL PI IND STRL 7.0 (GLOVE) ×1 IMPLANT
GLOVE BIOGEL PI INDICATOR 6.5 (GLOVE) ×4
GLOVE BIOGEL PI INDICATOR 7.0 (GLOVE) ×2
GLOVE ECLIPSE 6.5 STRL STRAW (GLOVE) ×3 IMPLANT
GLOVE SS BIOGEL STRL SZ 7.5 (GLOVE) ×1 IMPLANT
GLOVE SUPERSENSE BIOGEL SZ 7.5 (GLOVE) ×2
GOWN STRL REUS W/ TWL LRG LVL3 (GOWN DISPOSABLE) ×3 IMPLANT
GOWN STRL REUS W/TWL LRG LVL3 (GOWN DISPOSABLE) ×6
KIT BASIN OR (CUSTOM PROCEDURE TRAY) ×3 IMPLANT
KIT ROOM TURNOVER OR (KITS) ×3 IMPLANT
NS IRRIG 1000ML POUR BTL (IV SOLUTION) ×3 IMPLANT
PACK GENERAL/GYN (CUSTOM PROCEDURE TRAY) ×3 IMPLANT
PAD ARMBOARD 7.5X6 YLW CONV (MISCELLANEOUS) ×6 IMPLANT
PADDING CAST COTTON 6X4 STRL (CAST SUPPLIES) IMPLANT
SAW GIGLI STERILE 20 (MISCELLANEOUS) ×3 IMPLANT
SPONGE GAUZE 4X4 12PLY STER LF (GAUZE/BANDAGES/DRESSINGS) ×3 IMPLANT
STAPLER VISISTAT 35W (STAPLE) ×3 IMPLANT
STOCKINETTE IMPERVIOUS LG (DRAPES) ×3 IMPLANT
SUT ETHILON 3 0 PS 1 (SUTURE) IMPLANT
SUT VIC AB 0 CT1 18XCR BRD 8 (SUTURE) ×2 IMPLANT
SUT VIC AB 0 CT1 8-18 (SUTURE) ×4
SUT VICRYL AB 2 0 TIES (SUTURE) ×3 IMPLANT
TOWEL OR 17X24 6PK STRL BLUE (TOWEL DISPOSABLE) ×3 IMPLANT
TOWEL OR 17X26 10 PK STRL BLUE (TOWEL DISPOSABLE) ×3 IMPLANT
UNDERPAD 30X30 INCONTINENT (UNDERPADS AND DIAPERS) ×3 IMPLANT
WATER STERILE IRR 1000ML POUR (IV SOLUTION) ×3 IMPLANT

## 2014-07-09 NOTE — Progress Notes (Signed)
Pt finally sleeping well. Pt  Sats on room air were in low 80's. Pt aroused and instructed to deep breath via nares. Sats remained in mid 80's.  RN placed 02 @ 3L, gradually with deep breathing sats came to 90%. Will monitor and inform oncoming RN.

## 2014-07-09 NOTE — Progress Notes (Signed)
Report called to [place] in holding area.

## 2014-07-09 NOTE — Progress Notes (Signed)
Returned to room 5w33 from PACU at this time. Sleepy but easily arousable, Denies nausea, c/o RLE pain 4/10. Reoriented to room and surroundings.

## 2014-07-09 NOTE — Anesthesia Preprocedure Evaluation (Signed)
Anesthesia Evaluation  Patient identified by MRN, date of birth, ID band Patient awake    Reviewed: Allergy & Precautions, H&P , NPO status , Patient's Chart, lab work & pertinent test results  Airway Mallampati: II TM Distance: >3 FB Neck ROM: Full    Dental  (+) Teeth Intact, Dental Advisory Given   Pulmonary Current Smoker,  breath sounds clear to auscultation        Cardiovascular Rhythm:Regular Rate:Normal     Neuro/Psych    GI/Hepatic   Endo/Other  diabetes  Renal/GU      Musculoskeletal   Abdominal   Peds  Hematology   Anesthesia Other Findings   Reproductive/Obstetrics                           Anesthesia Physical Anesthesia Plan  ASA: III  Anesthesia Plan: General   Post-op Pain Management:    Induction: Intravenous  Airway Management Planned: LMA  Additional Equipment:   Intra-op Plan:   Post-operative Plan:   Informed Consent: I have reviewed the patients History and Physical, chart, labs and discussed the procedure including the risks, benefits and alternatives for the proposed anesthesia with the patient or authorized representative who has indicated his/her understanding and acceptance.   Dental advisory given  Plan Discussed with: CRNA and Anesthesiologist  Anesthesia Plan Comments: (PVD with gangrene R. Knee Htn Type 2 DM glucose 182 HIV Hepatitis C Smoker H/O substance abuse  Plan GA with LMA  [name])        Anesthesia Quick Evaluation

## 2014-07-09 NOTE — H&P (View-Only) (Signed)
[facility]   Subjective  - nausea   Objective 155/77 78 98.2 F (36.8 C) (Oral) 16 94%  Intake/Output Summary (Last 24 hours) at 07/08/14 0553 Last data filed at 07/07/14 2200  Gross per 24 hour  Intake 1368.75 ml  Output    300 ml  Net 1068.75 ml     Assessment/Planning: BKA right leg tomorrow by Dr Donnetta Hutching as long as stable today Hypokalemia 80- meq PO ordered now, recheck later today per primary team Renal function improved since admission Nausea vomiting per primary team  Celise Bazar E 07/08/2014 5:53 AM --  Laboratory Lab Results:  Recent Labs  07/05/14 0720 07/07/14 1145  WBC 12.7* 14.2*  HGB 10.9* 11.3*  HCT 34.0* 33.7*  PLT 238 213   BMET  Recent Labs  07/06/14 0854 07/07/14 1145  NA 141 141  K 3.1* 2.4*  CL 105 105  CO2 22 20  GLUCOSE 135* 123*  BUN 12 12  CREATININE 1.69* 1.44*  CALCIUM 8.4 8.2*    COAG Lab Results  Component Value Date   INR 1.01 01/10/2014   INR 0.93 05/09/2013   INR 0.96 02/20/2013   No results found for this basename: PTT

## 2014-07-09 NOTE — Telephone Encounter (Signed)
Message copied by Gena Fray on Mon Jul 09, 2014  4:46 PM ------      Message from: Peter Minium K      Created: Mon Jul 09, 2014 12:09 PM      Regarding: Schedule                   ----- Message -----         From: Alvia Grove, PA-C         Sent: 07/26/2014  11:58 AM           To: [facility] Charge Pool            S/p right BKa 07/14/2014            F/u with Dr. Donnetta Hutching in 4 weeks.            Thanks, Maudie Mercury ------

## 2014-07-09 NOTE — Progress Notes (Deleted)
PATIENT DETAILS Name: [name] Age: 58 y.o. Sex: female Date of Birth: 1955-12-28 Admit Date: 06/22/2014 Admitting Physician Evalee Mutton Kristeen Mans, MD PCP:Pcp Not In System  Subjective: Mentions its [birth year] birthday today.  No particular complaints, but patient is ill appearing.  Assessment/Plan: Acute kidney injury  -Likely secondary to dehydration, medications and/or chronic disease states  -creatinine down trending with IVF.  -Continue to hold Lisinopril  Metabolic Acidosis  -Suspect secondary to renal failure and ischemic leg  -c/w  D5W with Bicarb-transition to oral bicarb when able  Nausea/vomiting  -?etiology-belly is soft, Xray abd done on 8/1-neg for SBO,  - No further vomiting over night on 8/3 - c/w Zofran-much better today  Hypokalemia  -likely secondary to GI loss-vomiting  -replete and recheck in am  Peripheral Vascular Disease with right foot pain  -Vascular surgery scheduled BKA on 07/07/2014.  -resume Neurontin, prn Narcotics for leg pain   Hypotension  -suspect secondary to hypovolumia  -resolved with IVF   Hypoglycemia  -occurred on admission-secondary to NPO status/ARF, CBG's stable-on Lantus 15 units with SSI  HTN  -controlled with Toprol, no ACEI for now  -follow BP trend and adjust accordingly   Diabetes Mellitus Type 2  -CBG's stable-c/w 15 units and SSI  B/L Toothache -resolved, if recurrs check panoromic xray  Insomnia  -supportive care with Ambien  -Encouraged sleep hygiene and relaxation   Depression  Continue zoloft   HIV & Hepatitis C  ID consulted.  Per Dr. Megan Salon, "Continue to hold antiretroviral therapy until her renal function stabilizes; Hold Truvada and Tivicay, Check CD4 count and HIV viral load, Check hepatitis C viral load"  Disposition: Remain inpatient  DVT Prophylaxis: Prophylactic Heparin   Code Status: Full code   Family Communication None at bedside  Procedures:  None  CONSULTS:  ID and  vascular surgery   MEDICATIONS: Scheduled Meds: . [facility] HOLD] aspirin EC  81 mg Oral Daily  . [MAR HOLD] atorvastatin  10 mg Oral QHS  . [facility] HOLD] docusate sodium  100 mg Oral Daily  . [MAR HOLD] heparin  5,000 Units Subcutaneous 3 times per day  . [MAR HOLD] insulin aspart  0-9 Units Subcutaneous TID WC  . [MAR HOLD] insulin glargine  15 Units Subcutaneous QHS  . lidocaine-prilocaine  1 application Topical Once  . [facility] HOLD] lipase/protease/amylase  24,000 Units Oral TID WC  . [facility] HOLD] loratadine  10 mg Oral Daily  . [facility] HOLD] metoprolol succinate  25 mg Oral QPC breakfast  . [MAR HOLD] multivitamin with minerals  1 tablet Oral Daily  . [facility] HOLD] mupirocin ointment  1 application Nasal BID  . [MAR HOLD] ondansetron (ZOFRAN) IV  4 mg Intravenous TID AC  . oxymetazoline  2 spray Each Nare Once  . [MAR HOLD] polyethylene glycol  17 g Oral BID  . [facility] HOLD] potassium chloride  40 mEq Oral BID  . [MAR HOLD] senna  2 tablet Oral QHS  . [facility] HOLD] sertraline  100 mg Oral Daily  . [MAR HOLD] sodium chloride  3 mL Intravenous Q12H  . [facility] HOLD] traZODone  100 mg Oral Once   Continuous Infusions: . dextrose 5 % 1,000 mL with potassium chloride 20 mEq, sodium bicarbonate 150 mEq infusion 75 mL/hr at 07/08/14 1949   PRN Meds:.[MAR HOLD] acetaminophen, [MAR HOLD] acetaminophen, [MAR HOLD] alum & mag hydroxide-simeth, [MAR HOLD] bisacodyl, [MAR HOLD] hydrALAZINE, [MAR HOLD] hydrOXYzine, [MAR HOLD] lipase/protease/amylase, [MAR HOLD] LORazepam, [  MAR HOLD]  morphine injection, [MAR HOLD] ondansetron, [MAR HOLD] oxyCODONE, [MAR HOLD] promethazine, [MAR HOLD] sodium phosphate, [MAR HOLD] zolpidem  Antibiotics: Anti-infectives   Start     Dose/Rate Route Frequency Ordered Stop   08/03/2014 0600  [MAR Hold]  cefUROXime (ZINACEF) 1.5 g in dextrose 5 % 50 mL IVPB     (On MAR Hold since 07/12/2014 0923)   1.5 g 100 mL/hr over 30 Minutes Intravenous On call to O.R. 07/06/14 1047 08/04/2014 1034    07/05/14 1000  emtricitabine-tenofovir (TRUVADA) 200-300 MG per tablet 1 tablet  Status:  Discontinued     1 tablet Oral Daily 06/30/2014 1616 06/27/2014 1839   06/16/2014 1700  dolutegravir (TIVICAY) tablet 50 mg  Status:  Discontinued     50 mg Oral Daily 07/02/2014 1616 07/03/2014 1839       PHYSICAL EXAM: Vital signs in last 24 hours: Filed Vitals:   07/08/14 2300 07/18/2014 0528 07/17/2014 0539 07/12/2014 0833  BP: 124/78 118/72  147/71  Pulse: 75 79  75  Temp:  99.2 F (37.3 C)  98.9 F (37.2 C)  TempSrc:  Oral  Oral  Resp:  24  22  Height:      Weight:      SpO2:  83% 91% 91%    Weight change:  Filed Weights   06/24/2014 1022 07/06/2014 1616  Weight: 58.968 kg (130 lb) 58.968 kg (130 lb)   Body mass index is 22.3 kg/(m^2).   Gen Exam: Awake and alert with clear speech.  Pale pallor, slightly tremulous. Neck: Supple, No JVD.   Chest: B/L Clear.  No accessory muscle use. CVS: S1 S2 Regular, no murmurs.  Abdomen: soft, BS +, non tender, non distended. No frank organomegaly Extremities: Extremities: no edema, left BKA healed completely, Right foot is cold Cannot palpate DP pulse. Neurologic: Non Focal.     Intake/Output from previous day:  Intake/Output Summary (Last 24 hours) at 07/16/2014 1053 Last data filed at 07/10/2014 0846  Gross per 24 hour  Intake      0 ml  Output      0 ml  Net      0 ml     LAB RESULTS: CBC  Recent Labs Lab 06/14/2014 1058 06/27/2014 1514 07/05/14 0720 07/07/14 1145 07/08/14 2344  WBC  --  10.2 12.7* 14.2* 19.2*  HGB 12.9 11.3* 10.9* 11.3* 11.4*  HCT 38.0 34.4* 34.0* 33.7* 34.7*  PLT  --  238 238 213 245  MCV  --  90.1 90.9 91.6 91.6  MCH  --  29.6 29.1 30.7 30.1  MCHC  --  32.8 32.1 33.5 32.9  RDW  --  18.8* 19.1* 18.3* 18.5*    Chemistries   Recent Labs Lab 06/30/2014 1530  07/06/14 0854 07/07/14 1145 07/08/14 0505 07/08/14 1601 07/08/14 2344  NA  --   < > 141 141 142 144 141  K  --   < > 3.1* 2.4* 3.4* 3.2* 3.3*  CL  --   < > 105  105 103 107 105  CO2  --   < > 22 20 14* 20 18*  GLUCOSE  --   < > 135* 123* 236* 176* 117*  BUN  --   < > 12 12 23  26* 23  CREATININE  --   < > 1.69* 1.44* 1.54* 1.53* 1.32*  CALCIUM  --   < > 8.4 8.2* 8.6 8.9 8.5  MG 2.3  --   --   --   --   --   --   < > =  values in this interval not displayed.  CBG:  Recent Labs Lab 07/08/14 0818 07/08/14 1147 07/08/14 1701 07/08/14 2142 07/08/2014 0752  GLUCAP 186* 182* 177* 111* 182*     MICROBIOLOGY: Recent Results (from the past 240 hour(s))  CULTURE, BLOOD (ROUTINE X 2)     Status: None   Collection Time    06/21/2014  2:40 PM      Result Value Ref Range Status   Specimen Description BLOOD LEFT HAND   Final   Special Requests BOTTLES DRAWN AEROBIC AND ANAEROBIC 2CC   Final   Culture  Setup Time     Final   Value: 06/24/2014 21:54     Performed at [facility]   Culture     Final   Value:        BLOOD CULTURE RECEIVED NO GROWTH TO DATE CULTURE WILL BE HELD FOR 5 DAYS BEFORE ISSUING A FINAL NEGATIVE REPORT     Performed at [facility]   Report Status PENDING   Incomplete  MRSA PCR SCREENING     Status: Abnormal   Collection Time    06/25/2014  4:46 PM      Result Value Ref Range Status   MRSA by PCR POSITIVE (*) NEGATIVE Final   Comment:            The GeneXpert MRSA Assay (FDA     approved for NASAL specimens     only), is one component of a     comprehensive MRSA colonization     surveillance program. It is not     intended to diagnose MRSA     infection nor to guide or     monitor treatment for     MRSA infections.     RESULT CALLED TO, READ BACK BY AND VERIFIED WITH:     Durenda Age RN 19:05 07/05/2014 ([email or web address])  CULTURE, BLOOD (ROUTINE X 2)     Status: None   Collection Time    06/19/2014  6:12 PM      Result Value Ref Range Status   Specimen Description BLOOD HAND RIGHT   Final   Special Requests BOTTLES DRAWN AEROBIC AND ANAEROBIC 2CC   Final   Culture  Setup Time     Final   Value: 07/05/2014 00:36      Performed at [facility]   Culture     Final   Value:        BLOOD CULTURE RECEIVED NO GROWTH TO DATE CULTURE WILL BE HELD FOR 5 DAYS BEFORE ISSUING A FINAL NEGATIVE REPORT     Performed at [facility]   Report Status PENDING   Incomplete  URINE CULTURE     Status: None   Collection Time    07/03/2014 10:40 PM      Result Value Ref Range Status   Specimen Description URINE, RANDOM   Final   Special Requests NONE   Final   Culture  Setup Time     Final   Value: 06/23/2014 22:57     Performed at [facility]     Final   Value: NO GROWTH     Performed at [facility]   Culture     Final   Value: NO GROWTH     Performed at [facility]   Report Status 07/05/2014 FINAL   Final    RADIOLOGY STUDIES/RESULTS: Dg Chest Port 1 View  06/27/2014   CLINICAL  DATA:  Acidosis  EXAM: PORTABLE CHEST - 1 VIEW  COMPARISON:  02/20/2013  FINDINGS: There are bilateral chronic bronchitic changes. There is no focal parenchymal opacity, pleural effusion, or pneumothorax. The heart and mediastinal contours are unremarkable.  The osseous structures are unremarkable.  IMPRESSION: No active disease.   Electronically Signed   By: Kathreen Devoid   On: 06/16/2014 14:58    Karen Kitchens [facility] Pager:[phone]  If 7PM-7AM, please contact night-coverage [email or web address] Password [email or web address] 07/11/2014, 10:53 AM   LOS: 5 days   **Disclaimer: This note may have been dictated with voice recognition software. Similar sounding words can inadvertently be transcribed and this note may contain transcription errors which may not have been corrected upon publication of note.**

## 2014-07-09 NOTE — Anesthesia Postprocedure Evaluation (Signed)
  Anesthesia Post-op Note  Patient: [name]  Procedure(s) Performed: Procedure(s): RIGHT BELOW THE KNEE AMPUTATION (Right)  Patient Location: PACU  Anesthesia Type:General  Level of Consciousness: awake, alert  and oriented  Airway and Oxygen Therapy: Patient Spontanous Breathing and Patient connected to nasal cannula oxygen  Post-op Pain: mild  Post-op Assessment: Post-op Vital signs reviewed, Patient's Cardiovascular Status Stable, Respiratory Function Stable, Patent Airway and Pain level controlled  Post-op Vital Signs: stable  Last Vitals:  Filed Vitals:   07/14/2014 1421  BP: 162/78  Pulse: 81  Temp: 37.2 C  Resp: 20    Complications: No apparent anesthesia complications

## 2014-07-09 NOTE — Progress Notes (Signed)
  [facility] Day of Surgery Note   Subjective:  Has complaints of pain.  RN states she just gave her something in her IV for pain  Filed Vitals:   07/22/2014 1345  BP: 151/68  Pulse: 77  Temp:   Resp: 20    Incisions:   Bandaged and is clean and dry   Assessment/Plan:  This is a 58 y.o. female who is s/p right BKA  -pt doing well--will get pain under better control -will remove dressing in 2 days   Leontine Locket, PA-C 08/05/2014 1:55 PM

## 2014-07-09 NOTE — Telephone Encounter (Signed)
Lm for [name] at nursing center, dpm

## 2014-07-09 NOTE — Interval H&P Note (Signed)
History and Physical Interval Note:  07/16/2014 9:53 AM  [name]  has presented today for surgery, with the diagnosis of Other disorder of muscle, ligament, and fascia  The various methods of treatment have been discussed with the patient and family. After consideration of risks, benefits and other options for treatment, the patient has consented to  Procedure(s): AMPUTATION BELOW KNEE (Right) as a surgical intervention .  The patient's history has been reviewed, patient examined, no change in status, stable for surgery.  I have reviewed the patient's chart and labs.  Questions were answered to the patient's satisfaction.     Gaelyn Tukes

## 2014-07-09 NOTE — Op Note (Signed)
    OPERATIVE REPORT  DATE OF SURGERY: 07/20/2014  PATIENT: [name], 58 y.o. female MRN: [id number]  DOB: 10/12/1956  PRE-OPERATIVE DIAGNOSIS: Rest pain and ischemia right foot  POST-OPERATIVE DIAGNOSIS:  Same  PROCEDURE: Right below-knee amputation  SURGEON:  Curt Jews, M.D.  PHYSICIAN ASSISTANT: Trinh  ANESTHESIA:  Gen.  EBL: 100 ml  Total I/O In: 700 [I.V.:700] Out: -   BLOOD ADMINISTERED: None  DRAINS: None  SPECIMEN: Right below-knee amputation  COUNTS CORRECT:  YES  PLAN OF CARE: PACU   PATIENT DISPOSITION:  PACU - hemodynamically stable  PROCEDURE DETAILS: Patient was taken to the operating room placed supine position where the area of the right leg was prepped from the proximal thigh down to the ankle. Using a posterior based muscle flap incision was made several fingerbreadths below the tibial prominence and carried down through the anterior tibial muscle body. The muscle was all viable. The anterior tibial neurovascular bundle was ligated with 0 Vicryl ties. Periosteum was elevated on on the medial aspect of the fibula and this too was neurovascular bundle was ligated with 0 Vicryl ties and divided. The gastrocnemius and soleus muscle were identified on the medial aspect and the gastrocnemius was divided in line with the skin incision for a posterior based muscle flap. A portion of the soleus muscle was left proximally and was resected distally. The popliteal artery was ligated with 0 Vicryl ties and divided. The great saphenous was ligated with 0 Vicryl ties and the small saphenous was ligated with 0 Vicryl ties and divided. The fibula was done with divided with bone shears after elevating the periosteum proximally. The tibia also had the periosteum elevated proximally and was divided with the Gigli saw. The tibial prominence was smoothed with a bone rasp to prevent tissue pressure on the anterior aspect of the stump. The specimen was passed off the field. The  wound is irrigated with saline and hemostasis was obtained with electrocautery. The posterior muscle and skin flap was brought over the ends of the band and was closed with 8-0 Vicryl figure-of-eight sutures. The wound is again irrigated with saline and the skin was closed with skin staples. A iodoform gauze 4 x 4's Curlex and Ace wrap were placed. The patient tolerated seizure that immediate complication was transferred to the recovery room in stable conditionthe fibula. The peroneal artery was exposed   Curt Jews, M.D. 07/19/2014 11:59 AM

## 2014-07-09 NOTE — Progress Notes (Signed)
Patient ID: [name], female   DOB: [birth year], 58 y.o.   MRN: [id number]         [facility]    Date of Admission:  06/13/2014     Lab Results Lab Results  Component Value Date   WBC 19.2* 07/08/2014   HGB 11.4* 07/08/2014   HCT 34.7* 07/08/2014   MCV 91.6 07/08/2014   PLT 245 07/08/2014    Lab Results  Component Value Date   CREATININE 1.32* 07/08/2014   BUN 23 07/08/2014   NA 141 07/08/2014   K 3.3* 07/08/2014   CL 105 07/08/2014   CO2 18* 07/08/2014    HIV 1 RNA Quant (copies/mL)  Date Value  06/24/2014 <20   11/21/2013 <20   08/01/2013 <20      CD4 T Cell Abs (/uL)  Date Value  06/21/2014 1200   11/21/2013 1140   08/01/2013 960    Assessment: Her renal function is improving slowly. I will keep her off of antiretroviral therapy until her renal function stabilizes.  Plan: 1. Continue observation off of antiretroviral therapy for now 2. I will followup on Wednesday, August 5  Michel Bickers, [place] for [facility] [phone] pager   [phone] cell 07/16/2014, 4:35 PM

## 2014-07-09 NOTE — Transfer of Care (Signed)
Immediate Anesthesia Transfer of Care Note  Patient: [name]  Procedure(s) Performed: Procedure(s): RIGHT BELOW THE KNEE AMPUTATION (Right)  Patient Location: PACU  Anesthesia Type:General  Level of Consciousness: patient cooperative and responds to stimulation  Airway & Oxygen Therapy: Patient Spontanous Breathing and Patient connected to face mask oxygen  Post-op Assessment: Report given to PACU RN and Post -op Vital signs reviewed and stable  Post vital signs: Reviewed and stable  Complications: No apparent anesthesia complications

## 2014-07-09 NOTE — Progress Notes (Signed)
Thank you for consult on [name]. Chart reviewed and PT/OT evaluations pending. Patient resides in SNF and would recommend therapies at SNF past discharge. Will defer CIR consult for now.

## 2014-07-09 NOTE — Progress Notes (Signed)
PATIENT DETAILS Name: [name] Age: 58 y.o. Sex: female Date of Birth: 1956/09/10 Admit Date: 06/28/2014 Admitting Physician Evalee Mutton Kristeen Mans, MD PCP:Pcp Not In System  Subjective: Some nausea but no vomiting. Continues to have significant pain the Right lower extremity  Assessment/Plan: Acute kidney injury  -Likely secondary to dehydration, medications and/or chronic disease states  -creatinine down trending with IVF.  -Continue to hold Lisinopril  Metabolic Acidosis  -Suspect secondary to renal failure and ischemic leg  -c/w  D5W with Bicarb-transition to oral bicarb when able  Nausea/vomiting  -?etiology-belly is soft, Xray abd done on 8/1-neg for SBO, c/w Zofran-much better today  Hypokalemia  -likely secondary to GI loss-vomiting  -will continue to replete and recheck in am  Peripheral Vascular Disease with right foot pain  -Vascular surgery scheduled BKA on 07/11/2014. Will monitor closely post operatively -resume Neurontin, prn Narcotics for rest pain   Hypotension  -suspect secondary to hypovolumia  -resolved with IVF   Hypoglycemia  -occurred on admission-secondary to NPO status/ARF, CBG's stable-on Lantus 15 units with SSI  HTN  -controlled with Toprol, no ACEI for now  -follow BP trend and adjust accordingly   Diabetes Mellitus Type 2  -CBG's stable-c/w 15 units and SSI  B/L Toothache -resolved, if recurrs check panoromic xray  Insomnia  -supportive care with Ambien  -Encouraged sleep hygiene and relaxation   Depression  Continue zoloft   HIV & Hepatitis C  ID consulted.  Per Dr. Megan Salon, "Continue to hold antiretroviral therapy until her renal function stabilizes; Hold Truvada and Tivicay, Check CD4 count and HIV viral load, Check hepatitis C viral load"  Disposition: Remain inpatient  DVT Prophylaxis: Prophylactic Heparin   Code Status: Full code   Family Communication None at bedside  Procedures:  None  CONSULTS:  ID and vascular surgery   MEDICATIONS: Scheduled Meds: . [facility] HOLD] aspirin EC  81 mg Oral Daily  . [MAR HOLD] atorvastatin  10 mg Oral QHS  . [facility] HOLD] docusate sodium  100 mg Oral Daily  . [MAR HOLD] heparin  5,000 Units Subcutaneous 3 times per day  . [MAR HOLD] insulin aspart  0-9 Units Subcutaneous TID WC  . [MAR HOLD] insulin glargine  15 Units Subcutaneous QHS  . lidocaine-prilocaine  1 application Topical Once  . [facility] HOLD] lipase/protease/amylase  24,000 Units Oral TID WC  . [facility] HOLD] loratadine  10 mg Oral Daily  . [facility] HOLD] metoprolol succinate  25 mg Oral QPC breakfast  . [MAR HOLD] multivitamin with minerals  1 tablet Oral Daily  . [facility] HOLD] mupirocin ointment  1 application Nasal BID  . [MAR HOLD] ondansetron (ZOFRAN) IV  4 mg Intravenous TID AC  . oxymetazoline  2 spray Each Nare Once  . [MAR HOLD] polyethylene glycol  17 g Oral BID  . [facility] HOLD] potassium chloride  40 mEq Oral BID  . [MAR HOLD] senna  2 tablet Oral QHS  . [facility] HOLD] sertraline  100 mg Oral Daily  . [MAR HOLD] sodium chloride  3 mL Intravenous Q12H  . [facility] HOLD] traZODone  100 mg Oral Once   Continuous Infusions: . dextrose 5 % 1,000 mL with potassium chloride 20 mEq, sodium bicarbonate 150 mEq infusion 75 mL/hr at 07/08/14 1949   PRN Meds:.[MAR HOLD] acetaminophen, [MAR HOLD] acetaminophen, [MAR HOLD] alum & mag hydroxide-simeth, [MAR HOLD] bisacodyl, [MAR HOLD] hydrALAZINE, [MAR HOLD] hydrOXYzine, [MAR HOLD] lipase/protease/amylase, [MAR HOLD] LORazepam, [MAR HOLD]  morphine  injection, [MAR HOLD] ondansetron, [MAR HOLD] oxyCODONE, [MAR HOLD] promethazine, [MAR HOLD] sodium phosphate, [MAR HOLD] zolpidem  Antibiotics: Anti-infectives   Start     Dose/Rate Route Frequency Ordered Stop   07/20/2014 0600  [MAR Hold]  cefUROXime (ZINACEF) 1.5 g in dextrose 5 % 50 mL IVPB     (On MAR Hold since 07/25/2014 0923)   1.5 g 100 mL/hr over 30 Minutes Intravenous On call to O.R. 07/06/14 1047 08/01/2014 1034    07/05/14 1000  emtricitabine-tenofovir (TRUVADA) 200-300 MG per tablet 1 tablet  Status:  Discontinued     1 tablet Oral Daily 06/29/2014 1616 06/30/2014 1839   07/03/2014 1700  dolutegravir (TIVICAY) tablet 50 mg  Status:  Discontinued     50 mg Oral Daily 06/26/2014 1616 07/01/2014 1839       PHYSICAL EXAM: Vital signs in last 24 hours: Filed Vitals:   07/08/14 2300 07/31/2014 0528 07/31/2014 0539 07/07/2014 0833  BP: 124/78 118/72  147/71  Pulse: 75 79  75  Temp:  99.2 F (37.3 C)  98.9 F (37.2 C)  TempSrc:  Oral  Oral  Resp:  24  22  Height:      Weight:      SpO2:  83% 91% 91%    Weight change:  Filed Weights   06/27/2014 1022 06/16/2014 1616  Weight: 58.968 kg (130 lb) 58.968 kg (130 lb)   Body mass index is 22.3 kg/(m^2).   Gen Exam: Awake and alert with clear speech.  Chronically sick looking. Neck: Supple, No JVD.   Chest: B/L Clear.  No rhonchi this am. CVS: S1 S2 Regular, no murmurs.  Abdomen: soft, BS +, non tender, non distended.  Extremities: Extremities: no edema, left BKA healed completely, Right foot is cold Cannot palpate DP pulse. Neurologic: Non Focal.   Skin: No Rash.   Wounds: N/A.    Intake/Output from previous day:  Intake/Output Summary (Last 24 hours) at 07/22/2014 1057 Last data filed at 08/06/2014 0846  Gross per 24 hour  Intake      0 ml  Output      0 ml  Net      0 ml     LAB RESULTS: CBC  Recent Labs Lab 06/07/2014 1058 06/06/2014 1514 07/05/14 0720 07/07/14 1145 07/08/14 2344  WBC  --  10.2 12.7* 14.2* 19.2*  HGB 12.9 11.3* 10.9* 11.3* 11.4*  HCT 38.0 34.4* 34.0* 33.7* 34.7*  PLT  --  238 238 213 245  MCV  --  90.1 90.9 91.6 91.6  MCH  --  29.6 29.1 30.7 30.1  MCHC  --  32.8 32.1 33.5 32.9  RDW  --  18.8* 19.1* 18.3* 18.5*    Chemistries   Recent Labs Lab 06/17/2014 1530  07/06/14 0854 07/07/14 1145 07/08/14 0505 07/08/14 1601 07/08/14 2344  NA  --   < > 141 141 142 144 141  K  --   < > 3.1* 2.4* 3.4* 3.2* 3.3*  CL  --   < > 105  105 103 107 105  CO2  --   < > 22 20 14* 20 18*  GLUCOSE  --   < > 135* 123* 236* 176* 117*  BUN  --   < > 12 12 23  26* 23  CREATININE  --   < > 1.69* 1.44* 1.54* 1.53* 1.32*  CALCIUM  --   < > 8.4 8.2* 8.6 8.9 8.5  MG 2.3  --   --   --   --   --   --   < > =  values in this interval not displayed.  CBG:  Recent Labs Lab 07/08/14 0818 07/08/14 1147 07/08/14 1701 07/08/14 2142 07/11/2014 0752  GLUCAP 186* 182* 177* 111* 182*    GFR Estimated Creatinine Clearance: 39.6 ml/min (by C-G formula based on Cr of 1.32).  Coagulation profile No results found for this basename: INR, PROTIME,  in the last 168 hours  Cardiac Enzymes No results found for this basename: CK, CKMB, TROPONINI, MYOGLOBIN,  in the last 168 hours  No components found with this basename: POCBNP,  No results found for this basename: DDIMER,  in the last 72 hours No results found for this basename: HGBA1C,  in the last 72 hours No results found for this basename: CHOL, HDL, LDLCALC, TRIG, CHOLHDL, LDLDIRECT,  in the last 72 hours No results found for this basename: TSH, T4TOTAL, FREET3, T3FREE, THYROIDAB,  in the last 72 hours No results found for this basename: VITAMINB12, FOLATE, FERRITIN, TIBC, IRON, RETICCTPCT,  in the last 72 hours No results found for this basename: LIPASE, AMYLASE,  in the last 72 hours  Urine Studies No results found for this basename: UACOL, UAPR, USPG, UPH, UTP, UGL, UKET, UBIL, UHGB, UNIT, UROB, ULEU, UEPI, UWBC, URBC, UBAC, CAST, CRYS, UCOM, BILUA,  in the last 72 hours  MICROBIOLOGY: Recent Results (from the past 240 hour(s))  CULTURE, BLOOD (ROUTINE X 2)     Status: None   Collection Time    06/14/2014  2:40 PM      Result Value Ref Range Status   Specimen Description BLOOD LEFT HAND   Final   Special Requests BOTTLES DRAWN AEROBIC AND ANAEROBIC 2CC   Final   Culture  Setup Time     Final   Value: 06/16/2014 21:54     Performed at [facility]   Culture     Final    Value:        BLOOD CULTURE RECEIVED NO GROWTH TO DATE CULTURE WILL BE HELD FOR 5 DAYS BEFORE ISSUING A FINAL NEGATIVE REPORT     Performed at [facility]   Report Status PENDING   Incomplete  MRSA PCR SCREENING     Status: Abnormal   Collection Time    06/16/2014  4:46 PM      Result Value Ref Range Status   MRSA by PCR POSITIVE (*) NEGATIVE Final   Comment:            The GeneXpert MRSA Assay (FDA     approved for NASAL specimens     only), is one component of a     comprehensive MRSA colonization     surveillance program. It is not     intended to diagnose MRSA     infection nor to guide or     monitor treatment for     MRSA infections.     RESULT CALLED TO, READ BACK BY AND VERIFIED WITH:     Durenda Age RN 19:05 06/20/2014 ([email or web address])  CULTURE, BLOOD (ROUTINE X 2)     Status: None   Collection Time    06/20/2014  6:12 PM      Result Value Ref Range Status   Specimen Description BLOOD HAND RIGHT   Final   Special Requests BOTTLES DRAWN AEROBIC AND ANAEROBIC 2CC   Final   Culture  Setup Time     Final   Value: 07/05/2014 00:36     Performed at [facility]   Culture     Final  Value:        BLOOD CULTURE RECEIVED NO GROWTH TO DATE CULTURE WILL BE HELD FOR 5 DAYS BEFORE ISSUING A FINAL NEGATIVE REPORT     Performed at [facility]   Report Status PENDING   Incomplete  URINE CULTURE     Status: None   Collection Time    07/03/2014 10:40 PM      Result Value Ref Range Status   Specimen Description URINE, RANDOM   Final   Special Requests NONE   Final   Culture  Setup Time     Final   Value: 06/24/2014 22:57     Performed at [place]     Final   Value: NO GROWTH     Performed at [facility]   Culture     Final   Value: NO GROWTH     Performed at [facility]   Report Status 07/05/2014 FINAL   Final    RADIOLOGY STUDIES/RESULTS: Dg Chest Port 1 View  06/19/2014   CLINICAL DATA:  Acidosis  EXAM: PORTABLE CHEST  - 1 VIEW  COMPARISON:  02/20/2013  FINDINGS: There are bilateral chronic bronchitic changes. There is no focal parenchymal opacity, pleural effusion, or pneumothorax. The heart and mediastinal contours are unremarkable.  The osseous structures are unremarkable.  IMPRESSION: No active disease.   Electronically Signed   By: Kathreen Devoid   On: 06/10/2014 14:58    Oren Binet, MD  [facility] Pager:[phone]  If 7PM-7AM, please contact night-coverage [email or web address] Password [email or web address] 07/19/2014, 10:57 AM   LOS: 5 days   **Disclaimer: This note may have been dictated with voice recognition software. Similar sounding words can inadvertently be transcribed and this note may contain transcription errors which may not have been corrected upon publication of note.**

## 2014-07-10 ENCOUNTER — Inpatient Hospital Stay (HOSPITAL_COMMUNITY): Payer: Medicaid Other

## 2014-07-10 DIAGNOSIS — J690 Pneumonitis due to inhalation of food and vomit: Secondary | ICD-10-CM

## 2014-07-10 LAB — BLOOD GAS, ARTERIAL
Acid-base deficit: 8.5 mmol/L — ABNORMAL HIGH (ref 0.0–2.0)
Bicarbonate: 16.5 mEq/L — ABNORMAL LOW (ref 20.0–24.0)
DRAWN BY: 331001
FIO2: 0.36 %
O2 Saturation: 88.6 %
PCO2 ART: 33.5 mmHg — AB (ref 35.0–45.0)
PO2 ART: 57.1 mmHg — AB (ref 80.0–100.0)
Patient temperature: 98.6
TCO2: 17.5 mmol/L (ref 0–100)
pH, Arterial: 7.314 — ABNORMAL LOW (ref 7.350–7.450)

## 2014-07-10 LAB — URINALYSIS, ROUTINE W REFLEX MICROSCOPIC
Bilirubin Urine: NEGATIVE
GLUCOSE, UA: 500 mg/dL — AB
Ketones, ur: >80 mg/dL — AB
Leukocytes, UA: NEGATIVE
Nitrite: NEGATIVE
PH: 6 (ref 5.0–8.0)
Protein, ur: 100 mg/dL — AB
SPECIFIC GRAVITY, URINE: 1.015 (ref 1.005–1.030)
Urobilinogen, UA: 0.2 mg/dL (ref 0.0–1.0)

## 2014-07-10 LAB — BASIC METABOLIC PANEL
ANION GAP: 22 — AB (ref 5–15)
Anion gap: 22 — ABNORMAL HIGH (ref 5–15)
BUN: 23 mg/dL (ref 6–23)
BUN: 24 mg/dL — ABNORMAL HIGH (ref 6–23)
CHLORIDE: 112 meq/L (ref 96–112)
CO2: 12 meq/L — AB (ref 19–32)
CO2: 14 mEq/L — ABNORMAL LOW (ref 19–32)
Calcium: 8.5 mg/dL (ref 8.4–10.5)
Calcium: 8.7 mg/dL (ref 8.4–10.5)
Chloride: 113 mEq/L — ABNORMAL HIGH (ref 96–112)
Creatinine, Ser: 1.34 mg/dL — ABNORMAL HIGH (ref 0.50–1.10)
Creatinine, Ser: 1.42 mg/dL — ABNORMAL HIGH (ref 0.50–1.10)
GFR calc Af Amer: 46 mL/min — ABNORMAL LOW (ref >90)
GFR calc non Af Amer: 42 mL/min — ABNORMAL LOW (ref >90)
GFR, EST AFRICAN AMERICAN: 49 mL/min — AB (ref >90)
GFR, EST NON AFRICAN AMERICAN: 40 mL/min — AB (ref >90)
GLUCOSE: 94 mg/dL (ref 70–99)
Glucose, Bld: 177 mg/dL — ABNORMAL HIGH (ref 70–99)
POTASSIUM: 3.3 meq/L — AB (ref 3.7–5.3)
Potassium: 2.8 mEq/L — CL (ref 3.7–5.3)
SODIUM: 146 meq/L (ref 137–147)
SODIUM: 149 meq/L — AB (ref 137–147)

## 2014-07-10 LAB — CBC
HCT: 31.8 % — ABNORMAL LOW (ref 36.0–46.0)
Hemoglobin: 10.3 g/dL — ABNORMAL LOW (ref 12.0–15.0)
MCH: 30.3 pg (ref 26.0–34.0)
MCHC: 32.4 g/dL (ref 30.0–36.0)
MCV: 93.5 fL (ref 78.0–100.0)
Platelets: 250 10*3/uL (ref 150–400)
RBC: 3.4 MIL/uL — ABNORMAL LOW (ref 3.87–5.11)
RDW: 18.5 % — ABNORMAL HIGH (ref 11.5–15.5)
WBC: 28 10*3/uL — ABNORMAL HIGH (ref 4.0–10.5)

## 2014-07-10 LAB — GLUCOSE, CAPILLARY
GLUCOSE-CAPILLARY: 117 mg/dL — AB (ref 70–99)
Glucose-Capillary: 194 mg/dL — ABNORMAL HIGH (ref 70–99)
Glucose-Capillary: 112 mg/dL — ABNORMAL HIGH (ref 70–99)
Glucose-Capillary: 167 mg/dL — ABNORMAL HIGH (ref 70–99)
Glucose-Capillary: 94 mg/dL (ref 70–99)

## 2014-07-10 LAB — MRSA PCR SCREENING: MRSA by PCR: NEGATIVE

## 2014-07-10 LAB — CULTURE, BLOOD (ROUTINE X 2): Culture: NO GROWTH

## 2014-07-10 LAB — URINE MICROSCOPIC-ADD ON

## 2014-07-10 LAB — PRO B NATRIURETIC PEPTIDE: Pro B Natriuretic peptide (BNP): 3151 pg/mL — ABNORMAL HIGH (ref 0–125)

## 2014-07-10 MED ORDER — INSULIN GLARGINE 100 UNIT/ML ~~LOC~~ SOLN
5.0000 [IU] | Freq: Every day | SUBCUTANEOUS | Status: DC
  Administered 2014-07-10: 5 [IU] via SUBCUTANEOUS
  Filled 2014-07-10: qty 0.05

## 2014-07-10 MED ORDER — POTASSIUM CHLORIDE 10 MEQ/100ML IV SOLN
10.0000 meq | INTRAVENOUS | Status: AC
  Administered 2014-07-10 – 2014-07-11 (×4): 10 meq via INTRAVENOUS
  Filled 2014-07-10 (×4): qty 100

## 2014-07-10 MED ORDER — POTASSIUM CHLORIDE 10 MEQ/100ML IV SOLN
10.0000 meq | INTRAVENOUS | Status: AC
  Administered 2014-07-10 (×2): 10 meq via INTRAVENOUS
  Filled 2014-07-10 (×2): qty 100

## 2014-07-10 MED ORDER — GABAPENTIN 100 MG PO CAPS
200.0000 mg | ORAL_CAPSULE | Freq: Two times a day (BID) | ORAL | Status: DC
  Filled 2014-07-10 (×4): qty 2

## 2014-07-10 MED ORDER — PIPERACILLIN-TAZOBACTAM 3.375 G IVPB
3.3750 g | Freq: Three times a day (TID) | INTRAVENOUS | Status: DC
  Administered 2014-07-10 – 2014-07-18 (×24): 3.375 g via INTRAVENOUS
  Filled 2014-07-10 (×30): qty 50

## 2014-07-10 MED ORDER — LORAZEPAM 0.5 MG PO TABS: 0.5000 mg | ORAL_TABLET | Freq: Two times a day (BID) | ORAL | Status: DC | PRN

## 2014-07-10 MED ORDER — FUROSEMIDE 10 MG/ML IJ SOLN
40.0000 mg | Freq: Once | INTRAMUSCULAR | Status: AC
  Administered 2014-07-10: 40 mg via INTRAVENOUS
  Filled 2014-07-10: qty 4

## 2014-07-10 MED ORDER — VANCOMYCIN HCL IN DEXTROSE 750-5 MG/150ML-% IV SOLN
750.0000 mg | INTRAVENOUS | Status: DC
  Administered 2014-07-10 – 2014-07-14 (×4): 750 mg via INTRAVENOUS
  Filled 2014-07-10 (×5): qty 150

## 2014-07-10 MED ORDER — MORPHINE SULFATE 2 MG/ML IJ SOLN: 2.0000 mg | INTRAMUSCULAR | Status: DC | PRN

## 2014-07-10 MED ORDER — POTASSIUM CHLORIDE 2 MEQ/ML IV SOLN
INTRAVENOUS | Status: AC
  Administered 2014-07-10: via INTRAVENOUS
  Filled 2014-07-10: qty 1000

## 2014-07-10 MED ORDER — OXYCODONE HCL 5 MG PO TABS
5.0000 mg | ORAL_TABLET | Freq: Four times a day (QID) | ORAL | Status: DC | PRN
  Administered 2014-07-10: 5 mg via ORAL
  Filled 2014-07-10: qty 1

## 2014-07-10 MED ORDER — POTASSIUM CHLORIDE 20 MEQ/15ML (10%) PO LIQD
40.0000 meq | Freq: Two times a day (BID) | ORAL | Status: DC
  Filled 2014-07-10 (×3): qty 30

## 2014-07-10 MED ORDER — CHLORHEXIDINE GLUCONATE 0.12 % MT SOLN
15.0000 mL | Freq: Two times a day (BID) | OROMUCOSAL | Status: DC
Start: 2014-07-10 — End: 2014-07-12
  Administered 2014-07-10 – 2014-07-12 (×4): 15 mL via OROMUCOSAL
  Filled 2014-07-10 (×6): qty 15

## 2014-07-10 MED ORDER — CETYLPYRIDINIUM CHLORIDE 0.05 % MT LIQD
7.0000 mL | Freq: Two times a day (BID) | OROMUCOSAL | Status: DC
  Administered 2014-07-10 – 2014-07-11 (×3): 7 mL via OROMUCOSAL

## 2014-07-10 MED ORDER — SODIUM BICARBONATE 650 MG PO TABS
1300.0000 mg | ORAL_TABLET | Freq: Two times a day (BID) | ORAL | Status: DC
  Filled 2014-07-10 (×7): qty 2

## 2014-07-10 MED ORDER — OXYCODONE HCL 5 MG PO TABS: 5.0000 mg | ORAL_TABLET | Freq: Four times a day (QID) | ORAL | Status: DC | PRN

## 2014-07-10 NOTE — Progress Notes (Signed)
OT Cancellation Note  Patient Details Name: [name] MRN: [id number] DOB: May 09, 1956   Cancelled Treatment:    Reason Eval/Treat Not Completed: OT screened.  Pt is Medicaid (from SNF going back to SNF) and current D/C plan is SNF. No apparent immediate acute care OT needs, therefore will defer OT to SNF. If OT eval is needed please call Acute Rehab Dept. at [phone] or text page OT at [phone].    Benito Mccreedy OTR/L [phone] 07/10/2014, 9:40 AM

## 2014-07-10 NOTE — Evaluation (Signed)
Clinical/Bedside Swallow Evaluation Patient Details  Name: [name] MRN: [id number] Date of Birth: August 31, 1956  Today's Date: 07/10/2014 Time: 0347-4259 SLP Time Calculation (min): 19 min  Past Medical History:  Past Medical History  Diagnosis Date  . Depression   . Neuropathy   . Mood disorder   . TIA (transient ischemic attack)   . Pancreatitis chronic   . Chronic pain   . HIV (human immunodeficiency virus infection) 1990's    takes Norpramin nightly  . Polysubstance abuse   . Cardiomyopathy, nonischemic     EF 45%  . Peripheral vascular disease   . Asthma     "used to when I was young" (02/20/2013)  . Arthritis     "legs and arms" (02/20/2013)  . Hypercholesterolemia     takes Lipitor nightly  . Seasonal allergies     takes Zyrtec daily  . Anemia     takes Iron pill daily  . Nausea     takes Zofran prn  . GERD (gastroesophageal reflux disease)     takes Prilosec daily  . Insomnia     takes Trazodone nightly  . Constipation     takes miralax prn and stool softener daily  . Type II diabetes mellitus     lantus 45units at bedtime  . Dysrhythmia     takes Metoprolol daily  . Weakness of both legs   . Peripheral neuropathy   . Chronic back pain   . Hepatitis C     C  . Urinary frequency   . Nocturia    Past Surgical History:  Past Surgical History  Procedure Laterality Date  . Carotid endarterectomy Right   . Tubal ligation    . Lower extremity angiogram    . Toe amputation    . Amputation Left 04/28/2013    Procedure: AMPUTATION DIGIT;  Surgeon: Rosetta Posner, MD;  Location: [place];  Service: Vascular;  Laterality: Left;  left 2nd toe amputation  . Amputation Left 05/10/2013    Procedure: AMPUTATION BELOW KNEE;  Surgeon: Rosetta Posner, MD;  Location: [place];  Service: Vascular;  Laterality: Left;  . Cataract extraction w/phaco Right 02/27/2014    Procedure: CATARACT EXTRACTION PHACO AND INTRAOCULAR LENS PLACEMENT (IOC);  Surgeon: Elta Guadeloupe T. Gershon Crane, MD;  Location:  [facility];  Service: Ophthalmology;  Laterality: Right;  CDE:  6.77  . Cataract extraction w/phaco Left 03/20/2014    Procedure: CATARACT EXTRACTION PHACO AND INTRAOCULAR LENS PLACEMENT (IOC);  Surgeon: Elta Guadeloupe T. Gershon Crane, MD;  Location: [facility];  Service: Ophthalmology;  Laterality: Left;  CDE:6.98   HPI:  58 yo female with hx of PAD-s/p Left BKA, HIV, HTN, DM admitted on 7/29 for right foot pain. She was scheduled on day of admission to undergo an arteriogram however was found to be in ARF, and admitted to the hospitalist service. Her hospital course has been complicated by development of Nausea/vomting and hypokalemia/met acidosis. She subsequently underwent Right BKA on 8/3, however on 8/4-she was found to be lethargic, with leukocytosis. A CXR showed CHF and PNA.Current suspicion is for aspiration PNA given clinical setting.   Assessment / Plan / Recommendation Clinical Impression  Pt demonstrates reduced awareness and acceptance of PO, with oral holding requiring Max multimodal cues from SLP to clear. SLP provided oral suction with pureed solids due to reduced posterior transit despite cueing. Pt with decreased oral acceptance, turning her head away from PO trials and moaning. Recommend to continue NPO with SLP follow up. If  patient becomes more alert, she may have ice chips prn after thorough oral care.    Aspiration Risk  Severe    Diet Recommendation NPO;Ice chips PRN after oral care (ice chips only when maximally alert and engaged)   Medication Administration: Via alternative means    Other  Recommendations Oral Care Recommendations: Oral care Q4 per protocol;Oral care prior to ice chips   Follow Up Recommendations  Skilled Nursing facility;24 hour supervision/assistance    Frequency and Duration min 3x week  2 weeks   Pertinent Vitals/Pain n/a    SLP Swallow Goals     Swallow Study Prior Functional Status       General Date of Onset: 06/30/2014 HPI: 58 yo female with hx of  PAD-s/p Left BKA, HIV, HTN, DM admitted on 7/29 for right foot pain. She was scheduled on day of admission to undergo an arteriogram however was found to be in ARF, and admitted to the hospitalist service. Her hospital course has been complicated by development of Nausea/vomting and hypokalemia/met acidosis. She subsequently underwent Right BKA on 8/3, however on 8/4-she was found to be lethargic, with leukocytosis. A CXR showed CHF and PNA.Current suspicion is for aspiration PNA given clinical setting. Type of Study: Bedside swallow evaluation Previous Swallow Assessment: none in chart Diet Prior to this Study: NPO Temperature Spikes Noted: No Respiratory Status: Nasal cannula History of Recent Intubation: No Behavior/Cognition: Lethargic;Requires cueing Oral Cavity - Dentition: Missing dentition Self-Feeding Abilities: Total assist Patient Positioning: Upright in bed Baseline Vocal Quality: Clear Volitional Swallow: Unable to elicit    Oral/Motor/Sensory Function     Ice Chips Ice chips: Impaired Presentation: Spoon Oral Phase Impairments: Poor awareness of bolus;Impaired anterior to posterior transit;Other (comment) (decreased bolus acceptance) Pharyngeal Phase Impairments: Suspected delayed Swallow   Thin Liquid Thin Liquid: Impaired Presentation: Cup;Self Fed (HOH assist) Oral Phase Impairments: Reduced labial seal;Impaired anterior to posterior transit;Poor awareness of bolus Oral Phase Functional Implications: Oral holding Pharyngeal  Phase Impairments: Suspected delayed Swallow    Nectar Thick Nectar Thick Liquid: Not tested   Honey Thick Honey Thick Liquid: Not tested   Puree Puree: Impaired Presentation: Spoon Oral Phase Impairments: Impaired anterior to posterior transit;Reduced lingual movement/coordination;Poor awareness of bolus;Other (comment) (decreased bolus awareness) Pharyngeal Phase Impairments: Suspected delayed Swallow   Solid   GO    Solid: Not tested         Germain Osgood, M.A. CCC-SLP [phone]  Germain Osgood 07/10/2014,2:25 PM

## 2014-07-10 NOTE — Clinical Social Work Note (Addendum)
CSW was contacted by patient's ALF who states that they feel that patient needs to go to SNF placement at discharge prior to returning to ALF given her current medical needs. Patient will need to consider one of the LOG bed offers received. Patient has transferred to [facility], [place] has given handoff report to covering CSWs.  Liz Beach MSW, [place], [place], [phone]

## 2014-07-10 NOTE — Progress Notes (Signed)
PATIENT DETAILS Name: [name] Age: 58 y.o. Sex: female Date of Birth: [birth year] Admit Date: 07/05/2014 Admitting Physician Evalee Mutton Kristeen Mans, MD PCP:Pcp Not In System  Brief Summary Patient is a 58 yo female with hx of PAD-s/p Left BKA, HIV, HTN, DM admitted on 7/29 for right foot pain. She was scheduled on day of admission to undergo an arteriogram however was found to be in ARF, and admitted to the hospitalist service. Her hospital course has been complicated by development of Nausea/vomting and hypokalemia/met acidosis. She subsequently underwent Right BKA on 8/3, however on 8/4-she was found to be lethargic, with leukocytosis. A CXR showed CHF and PNA.Current suspicion is for aspiration PNA given clinical setting. She is being transferred to SDU for closer monitoring.   Subjective: Drowsy, no vomiting. Still complains of pain.  Assessment/Plan: Acute kidney injury  -Likely secondary to dehydration, medications and/or chronic disease states  -creatinine down trending. -Continue to hold Lisinopril  HCAP/Aspiration PNA -Very drowsy-recent vomiting-suspected Aspiration PNA. Leukocytosis significantly elevated today, CXR confirms RLL infiltrate. Otain blood culture, start IV Vanco/Zosyn-day 1.Obtain swallow eval.  Acute Encephalopathy -multifactorial-secondary to PNA, Narcotics -still drowsy-but answering questions appropriately. Check ABG, non focal exam. Minimize narcotics, resume low dose Neurontin.  Metabolic Acidosis  -Suspect secondary to renal failure and ischemic leg  -unfortunately seems to have developed CHF decompensation and PNA-stop IVF. Change to oral Bicarbonate. Lactic acid levels normal and no abdominal pain.  Nausea with vomiting  -?etiology-belly is soft, Xray abd done on 8/1-neg for SBO, c/w Zofran-much better today.   Hypokalemia  -likely secondary to GI loss-vomiting   -will continue to replete and recheck in am  Peripheral Vascular Disease  with right foot pain  -s/p BKA on 07/22/2014. Will monitor closely post operatively -resume Neurontin, prn Narcotics for rest pain -but given drowsiness/encephalopathy-minimize narcotics as much as possible  Hypotension  -suspect secondary to hypovolumia  -resolved with IVF   Hypoglycemia  -occurred on admission-secondary to NPO status/ARF, CBG's stable-on Lantus 15 units with SSI  HTN  -controlled with Toprol, no ACEI for now  -follow BP trend and adjust accordingly   Diabetes Mellitus Type 2  -CBG's stable-c/w 15 units and SSI  B/L Toothache -resolved, if recurrs check panoromic xray  Insomnia  -supportive care with Ambien  -Encouraged sleep hygiene and relaxation   Depression  Continue zoloft   HIV & Hepatitis C  ID consulted.  Per Dr. Megan Salon, "Continue to hold antiretroviral therapy until her renal function stabilizes; Hold Truvada and Tivicay, Check CD4 count and HIV viral load, Check hepatitis C viral load"  Hx of Chronic Pancreatitis -c/Creon  Disposition: Remain inpatient-transfer to SDU for closer monitoring.   DVT Prophylaxis: Prophylactic Heparin   Code Status: Full code   Family Communication None at bedside  Procedures:  None  CONSULTS:  ID and vascular surgery   MEDICATIONS: Scheduled Meds: . aspirin EC  81 mg Oral Daily  . atorvastatin  10 mg Oral QHS  . docusate sodium  100 mg Oral Daily  . gabapentin  200 mg Oral BID  . heparin  5,000 Units Subcutaneous 3 times per day  . insulin aspart  0-9 Units Subcutaneous TID WC  . insulin glargine  15 Units Subcutaneous QHS  . lipase/protease/amylase  24,000 Units Oral TID WC  . loratadine  10 mg Oral Daily  . metoprolol succinate  25 mg Oral QPC breakfast  . multivitamin with minerals  1 tablet Oral Daily  .  ondansetron (ZOFRAN) IV  4 mg Intravenous TID AC  . pantoprazole  40 mg Oral Daily  . piperacillin-tazobactam (ZOSYN)  IV  3.375 g Intravenous Q8H  . polyethylene glycol  17 g Oral BID    . potassium chloride  10 mEq Intravenous Q1 Hr x 2  . potassium chloride  40 mEq Oral BID  . senna  2 tablet Oral QHS  . sertraline  100 mg Oral Daily  . sodium bicarbonate  1,300 mg Oral BID  . sodium chloride  3 mL Intravenous Q12H  . traZODone  100 mg Oral Once  . vancomycin  750 mg Intravenous Q24H   Continuous Infusions:   PRN Meds:.acetaminophen, acetaminophen, alum & mag hydroxide-simeth, bisacodyl, guaiFENesin-dextromethorphan, hydrALAZINE, hydrOXYzine, labetalol, lipase/protease/amylase, LORazepam, magnesium sulfate 1 - 4 g bolus IVPB, metoprolol, ondansetron, oxyCODONE, phenol, potassium chloride, promethazine, sodium phosphate, zolpidem  Antibiotics: Anti-infectives   Start     Dose/Rate Route Frequency Ordered Stop   07/10/14 1200  piperacillin-tazobactam (ZOSYN) IVPB 3.375 g     3.375 g 12.5 mL/hr over 240 Minutes Intravenous Every 8 hours 07/10/14 0844     07/10/14 1000  vancomycin (VANCOCIN) IVPB 750 mg/150 ml premix     750 mg 150 mL/hr over 60 Minutes Intravenous Every 24 hours 07/10/14 0855     07/21/2014 2200  cefUROXime (ZINACEF) 1.5 g in dextrose 5 % 50 mL IVPB     1.5 g 100 mL/hr over 30 Minutes Intravenous Every 12 hours 08/05/2014 1423 07/10/14 1123   07/30/2014 0600  [MAR Hold]  cefUROXime (ZINACEF) 1.5 g in dextrose 5 % 50 mL IVPB     (On MAR Hold since 07/10/2014 0923)   1.5 g 100 mL/hr over 30 Minutes Intravenous On call to O.R. 07/06/14 1047 07/25/2014 1034   07/05/14 1000  emtricitabine-tenofovir (TRUVADA) 200-300 MG per tablet 1 tablet  Status:  Discontinued     1 tablet Oral Daily 06/25/2014 1616 07/05/2014 1839   06/10/2014 1700  dolutegravir (TIVICAY) tablet 50 mg  Status:  Discontinued     50 mg Oral Daily 07/03/2014 1616 06/09/2014 1839       PHYSICAL EXAM: Vital signs in last 24 hours: Filed Vitals:   07/26/2014 1421 07/12/2014 1450 07/20/2014 1948 07/10/14 0610  BP: 162/78  153/74 152/74  Pulse: 81  80 80  Temp: 98.9 F (37.2 C)  98.1 F (36.7 C) 98.5 F  (36.9 C)  TempSrc: Axillary  Oral Oral  Resp: _0 Height:      Weight:      SpO2: 95% 94% 92% 93%    Weight change:  Filed Weights   07/02/2014 1022 06/18/2014 1616  Weight: 58.968 kg (130 lb) 58.968 kg (130 lb)   Body mass index is 22.3 kg/(m^2).   Gen Exam: Awake,drowsy. Follows commmands.Slow but clear speech   Neck: Supple, No JVD.   Chest: B/L Clear.Bibasilar rales CVS: S1 S2 Regular, no murmurs.  Abdomen: soft, BS +, non tender, non distended.  Extremities: Extremities: no edema, left BKA healed completely, s/p Right BKA Neurologic: Non Focal.   Skin: No Rash.   Wounds: N/A.    Intake/Output from previous day:  Intake/Output Summary (Last 24 hours) at 07/10/14 1248 Last data filed at 07/10/14 1003  Gross per 24 hour  Intake    175 ml  Output      0 ml  Net    175 ml     LAB RESULTS: CBC  Recent Labs Lab 06/14/2014 1514  07/05/14 0720 07/07/14 1145 07/08/14 2344 07/10/14 0520  WBC 10.2 12.7* 14.2* 19.2* 28.0*  HGB 11.3* 10.9* 11.3* 11.4* 10.3*  HCT 34.4* 34.0* 33.7* 34.7* 31.8*  PLT 238 238 213 245 250  MCV 90.1 90.9 91.6 91.6 93.5  MCH 29.6 29.1 30.7 30.1 30.3  MCHC 32.8 32.1 33.5 32.9 32.4  RDW 18.8* 19.1* 18.3* 18.5* 18.5*    Chemistries   Recent Labs Lab 06/14/2014 1530  07/07/14 1145 07/08/14 0505 07/08/14 1601 07/08/14 2344 07/10/14 0520  NA  --   < > 141 142 144 141 146  K  --   < > 2.4* 3.4* 3.2* 3.3* 3.3*  CL  --   < > 105 103 107 105 112  CO2  --   < > 20 14* 20 18* 12*  GLUCOSE  --   < > 123* 236* 176* 117* 177*  BUN  --   < > 12 23 26* 23 23  CREATININE  --   < > 1.44* 1.54* 1.53* 1.32* 1.34*  CALCIUM  --   < > 8.2* 8.6 8.9 8.5 8.5  MG 2.3  --   --   --   --   --   --   < > = values in this interval not displayed.  CBG:  Recent Labs Lab 07/26/2014 1417 07/16/2014 1704 07/13/2014 2110 07/10/14 0754 07/10/14 1148  GLUCAP 162* 207* 251* 194* 112*    GFR Estimated Creatinine Clearance: 39 ml/min (by C-G formula based on  Cr of 1.34).  Coagulation profile No results found for this basename: INR, PROTIME,  in the last 168 hours  Cardiac Enzymes No results found for this basename: CK, CKMB, TROPONINI, MYOGLOBIN,  in the last 168 hours  No components found with this basename: POCBNP,  No results found for this basename: DDIMER,  in the last 72 hours No results found for this basename: HGBA1C,  in the last 72 hours No results found for this basename: CHOL, HDL, LDLCALC, TRIG, CHOLHDL, LDLDIRECT,  in the last 72 hours No results found for this basename: TSH, T4TOTAL, FREET3, T3FREE, THYROIDAB,  in the last 72 hours No results found for this basename: VITAMINB12, FOLATE, FERRITIN, TIBC, IRON, RETICCTPCT,  in the last 72 hours No results found for this basename: LIPASE, AMYLASE,  in the last 72 hours  Urine Studies No results found for this basename: UACOL, UAPR, USPG, UPH, UTP, UGL, UKET, UBIL, UHGB, UNIT, UROB, ULEU, UEPI, UWBC, URBC, UBAC, CAST, CRYS, UCOM, BILUA,  in the last 72 hours  MICROBIOLOGY: Recent Results (from the past 240 hour(s))  CULTURE, BLOOD (ROUTINE X 2)     Status: None   Collection Time    06/07/2014  2:40 PM      Result Value Ref Range Status   Specimen Description BLOOD LEFT HAND   Final   Special Requests BOTTLES DRAWN AEROBIC AND ANAEROBIC 2CC   Final   Culture  Setup Time     Final   Value: 07/03/2014 21:54     Performed at [facility]   Culture     Final   Value: NO GROWTH 5 DAYS     Performed at [facility]   Report Status 07/10/2014 FINAL   Final  MRSA PCR SCREENING     Status: Abnormal   Collection Time    06/09/2014  4:46 PM      Result Value Ref Range Status   MRSA by PCR POSITIVE (*) NEGATIVE Final  Comment:            The GeneXpert MRSA Assay (FDA     approved for NASAL specimens     only), is one component of a     comprehensive MRSA colonization     surveillance program. It is not     intended to diagnose MRSA     infection nor to guide or      monitor treatment for     MRSA infections.     RESULT CALLED TO, READ BACK BY AND VERIFIED WITH:     Durenda Age RN 19:05 07/03/2014 ([email or web address])  CULTURE, BLOOD (ROUTINE X 2)     Status: None   Collection Time    06/13/2014  6:12 PM      Result Value Ref Range Status   Specimen Description BLOOD HAND RIGHT   Final   Special Requests BOTTLES DRAWN AEROBIC AND ANAEROBIC 2CC   Final   Culture  Setup Time     Final   Value: 07/05/2014 00:36     Performed at [facility]   Culture     Final   Value:        BLOOD CULTURE RECEIVED NO GROWTH TO DATE CULTURE WILL BE HELD FOR 5 DAYS BEFORE ISSUING A FINAL NEGATIVE REPORT     Performed at [facility]   Report Status PENDING   Incomplete  URINE CULTURE     Status: None   Collection Time    06/13/2014 10:40 PM      Result Value Ref Range Status   Specimen Description URINE, RANDOM   Final   Special Requests NONE   Final   Culture  Setup Time     Final   Value: 06/15/2014 22:57     Performed at [place]     Final   Value: NO GROWTH     Performed at [facility]   Culture     Final   Value: NO GROWTH     Performed at [facility]   Report Status 07/05/2014 FINAL   Final    RADIOLOGY STUDIES/RESULTS: Dg Chest Port 1 View  06/07/2014   CLINICAL DATA:  Acidosis  EXAM: PORTABLE CHEST - 1 VIEW  COMPARISON:  02/20/2013  FINDINGS: There are bilateral chronic bronchitic changes. There is no focal parenchymal opacity, pleural effusion, or pneumothorax. The heart and mediastinal contours are unremarkable.  The osseous structures are unremarkable.  IMPRESSION: No active disease.   Electronically Signed   By: Kathreen Devoid   On: 06/06/2014 14:58    Oren Binet, MD  [facility] Pager:[phone]  If 7PM-7AM, please contact night-coverage [email or web address] Password [email or web address] 07/10/2014, 12:48 PM   LOS: 6 days   **Disclaimer: This note may have been dictated with voice recognition software.  Similar sounding words can inadvertently be transcribed and this note may contain transcription errors which may not have been corrected upon publication of note.**

## 2014-07-10 NOTE — Progress Notes (Signed)
Patient lethargic.  Pocketing pills.  Tried liquid potassium / crushed bicarb twice.  Given the bicarb of 12 will try d5 bicarb, potassium drip for very short duration.  (4-5 hours).  Speech eval ordered.  Imogene Burn, PA-C [facility] Pager: [phone]

## 2014-07-10 NOTE — Progress Notes (Signed)
Patient is alert to person and place but drowsy.  Attempted to give PM meds crushed in applesauce but patient pocketing medicine in mouth and unable to swallow.  CBG is 94 and due to receive lantus this pm.  Pt NPO. PA-C notified and new orders given.   M.Forest Gleason, RN

## 2014-07-10 NOTE — Progress Notes (Addendum)
  [facility] Progress Note  07/10/2014 8:06 AM POD 1  Subjective:  C/o pain.  Did not sleep last pm  Tm 99.8 now afebrile HR 70's-80's Regular 749'S systolic 49% 6PR9FM  Filed Vitals:   07/10/14 0610  BP: 152/74  Pulse: 80  Temp: 98.5 F (36.9 C)  Resp: 20    Physical Exam: Incisions:  Bandaged and is clean and dry   CBC    Component Value Date/Time   WBC 28.0* 07/10/2014 0520   RBC 3.40* 07/10/2014 0520   HGB 10.3* 07/10/2014 0520   HCT 31.8* 07/10/2014 0520   PLT 250 07/10/2014 0520   MCV 93.5 07/10/2014 0520   MCH 30.3 07/10/2014 0520   MCHC 32.4 07/10/2014 0520   RDW 18.5* 07/10/2014 0520   LYMPHSABS 2.2 02/21/2014 1030   MONOABS 1.1* 02/21/2014 1030   EOSABS 0.2 02/21/2014 1030   BASOSABS 0.1 02/21/2014 1030    BMET    Component Value Date/Time   NA 146 07/10/2014 0520   K 3.3* 07/10/2014 0520   CL 112 07/10/2014 0520   CO2 12* 07/10/2014 0520   GLUCOSE 177* 07/10/2014 0520   BUN 23 07/10/2014 0520   CREATININE 1.34* 07/10/2014 0520   CREATININE 1.01 11/21/2013 1506   CALCIUM 8.5 07/10/2014 0520   GFRNONAA 42* 07/10/2014 0520   GFRNONAA 62 11/21/2013 1506   GFRAA 49* 07/10/2014 0520   GFRAA 71 11/21/2013 1506    INR    Component Value Date/Time   INR 1.01 01/10/2014 1534     Intake/Output Summary (Last 24 hours) at 07/10/14 0806 Last data filed at 07/19/2014 1300  Gross per 24 hour  Intake    700 ml  Output      0 ml  Net    700 ml     Assessment/Plan:  58 y.o. female is s/p right below knee amputation  POD 1  -pt pain is not well controlled.  She has OxyIR and morphine ordered.  Will change Morphine from 2mg  q4h to 2mg  q2h. -leukocytosis with WBC 28k up from 19 and has trended upward since admission-will send u/a.  Pt did not have infection of her foot that would cause the leukocytosis. -will take dressing down tomorrow -pt resides in SNF and should transfer back when medically ready -hypokalemia at 3.3-stable from 2 days ago -renal function is  stable   Jasmine Locket, PA-C [facility] [phone] 07/10/2014 8:06 AM           I have examined the patient, reviewed and agree with above. Still over all uncomfortable and does not feel well. Still with some nausea. Reports pain in her amputation site. White blood cell count continues to increase. There was no evidence of infection in her right foot. ID following along with the patient.  Jasmine Spadaccini, MD 07/10/2014 8:33 AM

## 2014-07-10 NOTE — Progress Notes (Signed)
PT Cancellation Note  Patient Details Name: [name] MRN: [id number] DOB: 1956/08/01   Cancelled Treatment:    Reason Eval/Treat Not Completed: Patient declined, no reason specified Pt lethargic and not willing to open eyes upon request. Pt declining all mobility and to work with therapy today. Constantly mumbling no and shaking head no. Explained importance of mobility and the detrimental effects of bed rest but pt continued to decline. Will follow up on 8/5 or when pt willing to participate.   Candy Sledge A 07/10/2014, 10:08 AM Candy Sledge, PT, DPT [phone]

## 2014-07-10 NOTE — Clinical Social Work Note (Signed)
CSW has updated [facility] in [place] on patient's condition. They will review patient's information and determine if they are able to meet her needs.    Liz Beach MSW, [place], [place], [phone]

## 2014-07-10 NOTE — Progress Notes (Addendum)
ANTIBIOTIC CONSULT NOTE - INITIAL  Pharmacy Consult for zosyn Indication: r/o aspiration PNA  No Known Allergies  Patient Measurements: Height: 5\' 4"  (162.6 cm) Weight: 130 lb (58.968 kg) IBW/kg (Calculated) : 54.7  Vital Signs: Temp: 98.5 F (36.9 C) (08/04 0610) Temp src: Oral (08/04 0610) BP: 152/74 mmHg (08/04 0610) Pulse Rate: 80 (08/04 0610) Intake/Output from previous day: 08/03 0701 - 08/04 0700 In: 700 [I.V.:700] Out: -  Intake/Output from this shift: Total I/O In: 175 [P.O.:100; I.V.:75] Out: -   Labs:  Recent Labs  07/07/14 1145  07/08/14 1601 07/08/14 2344 07/10/14 0520  WBC 14.2*  --   --  19.2* 28.0*  HGB 11.3*  --   --  11.4* 10.3*  PLT 213  --   --  245 250  CREATININE 1.44*  < > 1.53* 1.32* 1.34*  < > = values in this interval not displayed. Estimated Creatinine Clearance: 39 ml/min (by C-G formula based on Cr of 1.34). No results found for this basename: VANCOTROUGH, Corlis Leak, VANCORANDOM, GENTTROUGH, GENTPEAK, GENTRANDOM, TOBRATROUGH, TOBRAPEAK, TOBRARND, AMIKACINPEAK, AMIKACINTROU, AMIKACIN,  in the last 72 hours   Microbiology: Recent Results (from the past 720 hour(s))  CULTURE, BLOOD (ROUTINE X 2)     Status: None   Collection Time    07/03/2014  2:40 PM      Result Value Ref Range Status   Specimen Description BLOOD LEFT HAND   Final   Special Requests BOTTLES DRAWN AEROBIC AND ANAEROBIC 2CC   Final   Culture  Setup Time     Final   Value: 07/05/2014 21:54     Performed at [facility]   Culture     Final   Value:        BLOOD CULTURE RECEIVED NO GROWTH TO DATE CULTURE WILL BE HELD FOR 5 DAYS BEFORE ISSUING A FINAL NEGATIVE REPORT     Performed at [facility]   Report Status PENDING   Incomplete  MRSA PCR SCREENING     Status: Abnormal   Collection Time    06/11/2014  4:46 PM      Result Value Ref Range Status   MRSA by PCR POSITIVE (*) NEGATIVE Final   Comment:            The GeneXpert MRSA Assay (FDA     approved  for NASAL specimens     only), is one component of a     comprehensive MRSA colonization     surveillance program. It is not     intended to diagnose MRSA     infection nor to guide or     monitor treatment for     MRSA infections.     RESULT CALLED TO, READ BACK BY AND VERIFIED WITH:     Durenda Age RN 19:05 06/24/2014 ([email or web address])  CULTURE, BLOOD (ROUTINE X 2)     Status: None   Collection Time    07/03/2014  6:12 PM      Result Value Ref Range Status   Specimen Description BLOOD HAND RIGHT   Final   Special Requests BOTTLES DRAWN AEROBIC AND ANAEROBIC 2CC   Final   Culture  Setup Time     Final   Value: 07/05/2014 00:36     Performed at [facility]   Culture     Final   Value:        BLOOD CULTURE RECEIVED NO GROWTH TO DATE CULTURE WILL BE HELD FOR 5 DAYS BEFORE ISSUING  A FINAL NEGATIVE REPORT     Performed at [facility]   Report Status PENDING   Incomplete  URINE CULTURE     Status: None   Collection Time    06/13/2014 10:40 PM      Result Value Ref Range Status   Specimen Description URINE, RANDOM   Final   Special Requests NONE   Final   Culture  Setup Time     Final   Value: 06/15/2014 22:57     Performed at [place]     Final   Value: NO GROWTH     Performed at [facility]   Culture     Final   Value: NO GROWTH     Performed at [facility]   Report Status 07/05/2014 FINAL   Final    Medical History: Past Medical History  Diagnosis Date  . Depression   . Neuropathy   . Mood disorder   . TIA (transient ischemic attack)   . Pancreatitis chronic   . Chronic pain   . HIV (human immunodeficiency virus infection) 1990's    takes Norpramin nightly  . Polysubstance abuse   . Cardiomyopathy, nonischemic     EF 45%  . Peripheral vascular disease   . Asthma     "used to when I was young" (02/20/2013)  . Arthritis     "legs and arms" (02/20/2013)  . Hypercholesterolemia     takes Lipitor nightly  . Seasonal  allergies     takes Zyrtec daily  . Anemia     takes Iron pill daily  . Nausea     takes Zofran prn  . GERD (gastroesophageal reflux disease)     takes Prilosec daily  . Insomnia     takes Trazodone nightly  . Constipation     takes miralax prn and stool softener daily  . Type II diabetes mellitus     lantus 45units at bedtime  . Dysrhythmia     takes Metoprolol daily  . Weakness of both legs   . Peripheral neuropathy   . Chronic back pain   . Hepatitis C     C  . Urinary frequency   . Nocturia     Medications:  Anti-infectives   Start     Dose/Rate Route Frequency Ordered Stop   07/10/14 1200  piperacillin-tazobactam (ZOSYN) IVPB 3.375 g     3.375 g 12.5 mL/hr over 240 Minutes Intravenous Every 8 hours 07/10/14 0844     07/14/2014 2200  cefUROXime (ZINACEF) 1.5 g in dextrose 5 % 50 mL IVPB     1.5 g 100 mL/hr over 30 Minutes Intravenous Every 12 hours 08/05/2014 1423 07/10/14 2159   07/15/2014 0600  [MAR Hold]  cefUROXime (ZINACEF) 1.5 g in dextrose 5 % 50 mL IVPB     (On MAR Hold since 07/25/2014 0923)   1.5 g 100 mL/hr over 30 Minutes Intravenous On call to O.R. 07/06/14 1047 08/05/2014 1034   07/05/14 1000  emtricitabine-tenofovir (TRUVADA) 200-300 MG per tablet 1 tablet  Status:  Discontinued     1 tablet Oral Daily 06/28/2014 1616 06/24/2014 1839   06/22/2014 1700  dolutegravir (TIVICAY) tablet 50 mg  Status:  Discontinued     50 mg Oral Daily 07/03/2014 1616 06/20/2014 1839     Assessment: 60 yof initially presented with R foot pain. 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 She is currently on post-op cefuroxime with last scheduled dose thisi  AM but to start zosyn today for possible aspiration pneumonia. Pt is afebrile but WBC is elevated at 28. Scr is also elevated at 1.34. Known history of HIV but ID is observing off anti-retrovirals for now.   Zosyn 8/4>>  7/29 Blood - NGTD 7/29 Urine - NEG  Goal of Therapy:  Eradication of infection  Plan:  1. Zosyn 3.375gm IV Q8H (4 hr inf) 2. F/u renal  fxn, C&S, clinical status  Bryana Froemming, Rande Lawman 07/10/2014,8:44 AM  Addendum: Also adding vancomycin.  Plan: 1. Vancomycin 750mg  IV Q24H 2. F/u renal fxn, C&S, clinical status and trough at [facility], PharmD, BCPS Pager # [phone] 07/10/2014 8:55 AM

## 2014-07-10 NOTE — Progress Notes (Signed)
Report given to RN on [place] for transfer from [phone]

## 2014-07-11 ENCOUNTER — Inpatient Hospital Stay (HOSPITAL_COMMUNITY): Payer: Medicaid Other

## 2014-07-11 DIAGNOSIS — R5381 Other malaise: Secondary | ICD-10-CM

## 2014-07-11 DIAGNOSIS — D72829 Elevated white blood cell count, unspecified: Secondary | ICD-10-CM

## 2014-07-11 DIAGNOSIS — R5383 Other fatigue: Secondary | ICD-10-CM

## 2014-07-11 LAB — CBC
HEMATOCRIT: 30.2 % — AB (ref 36.0–46.0)
Hemoglobin: 9.7 g/dL — ABNORMAL LOW (ref 12.0–15.0)
MCH: 29 pg (ref 26.0–34.0)
MCHC: 32.1 g/dL (ref 30.0–36.0)
MCV: 90.4 fL (ref 78.0–100.0)
Platelets: 253 10*3/uL (ref 150–400)
RBC: 3.34 MIL/uL — AB (ref 3.87–5.11)
RDW: 18.5 % — ABNORMAL HIGH (ref 11.5–15.5)
WBC: 25.6 10*3/uL — AB (ref 4.0–10.5)

## 2014-07-11 LAB — BASIC METABOLIC PANEL
Anion gap: 22 — ABNORMAL HIGH (ref 5–15)
BUN: 24 mg/dL — ABNORMAL HIGH (ref 6–23)
CALCIUM: 8.7 mg/dL (ref 8.4–10.5)
CO2: 14 meq/L — AB (ref 19–32)
Chloride: 112 mEq/L (ref 96–112)
Creatinine, Ser: 1.41 mg/dL — ABNORMAL HIGH (ref 0.50–1.10)
GFR calc Af Amer: 46 mL/min — ABNORMAL LOW (ref >90)
GFR calc non Af Amer: 40 mL/min — ABNORMAL LOW (ref >90)
GLUCOSE: 154 mg/dL — AB (ref 70–99)
POTASSIUM: 3.3 meq/L — AB (ref 3.7–5.3)
Sodium: 148 mEq/L — ABNORMAL HIGH (ref 137–147)

## 2014-07-11 LAB — CULTURE, BLOOD (ROUTINE X 2): CULTURE: NO GROWTH

## 2014-07-11 LAB — GLUCOSE, CAPILLARY
GLUCOSE-CAPILLARY: 187 mg/dL — AB (ref 70–99)
Glucose-Capillary: 158 mg/dL — ABNORMAL HIGH (ref 70–99)
Glucose-Capillary: 167 mg/dL — ABNORMAL HIGH (ref 70–99)
Glucose-Capillary: 178 mg/dL — ABNORMAL HIGH (ref 70–99)
Glucose-Capillary: 183 mg/dL — ABNORMAL HIGH (ref 70–99)
Glucose-Capillary: 203 mg/dL — ABNORMAL HIGH (ref 70–99)

## 2014-07-11 LAB — MAGNESIUM: Magnesium: 2 mg/dL (ref 1.5–2.5)

## 2014-07-11 MED ORDER — FLEET ENEMA 7-19 GM/118ML RE ENEM
1.0000 | ENEMA | Freq: Once | RECTAL | Status: AC
  Administered 2014-07-11: 1 via RECTAL
  Filled 2014-07-11: qty 1

## 2014-07-11 MED ORDER — PANTOPRAZOLE SODIUM 40 MG IV SOLR
40.0000 mg | INTRAVENOUS | Status: DC
  Administered 2014-07-11 – 2014-07-12 (×2): 40 mg via INTRAVENOUS
  Filled 2014-07-11 (×2): qty 40

## 2014-07-11 MED ORDER — FUROSEMIDE 10 MG/ML IJ SOLN
40.0000 mg | Freq: Once | INTRAMUSCULAR | Status: AC
  Administered 2014-07-11: 40 mg via INTRAVENOUS
  Filled 2014-07-11: qty 4

## 2014-07-11 MED ORDER — POTASSIUM CHLORIDE 10 MEQ/100ML IV SOLN
10.0000 meq | INTRAVENOUS | Status: AC
  Administered 2014-07-11 (×2): 10 meq via INTRAVENOUS
  Filled 2014-07-11 (×2): qty 100

## 2014-07-11 MED ORDER — SODIUM BICARBONATE 8.4 % IV SOLN
INTRAVENOUS | Status: AC
  Filled 2014-07-11: qty 880

## 2014-07-11 MED ORDER — POTASSIUM CHLORIDE 10 MEQ/100ML IV SOLN
10.0000 meq | INTRAVENOUS | Status: AC
  Administered 2014-07-12 (×4): 10 meq via INTRAVENOUS
  Filled 2014-07-11 (×3): qty 100

## 2014-07-11 MED ORDER — SODIUM BICARBONATE 8.4 % IV SOLN
INTRAVENOUS | Status: DC
  Filled 2014-07-11 (×2): qty 1000

## 2014-07-11 MED ORDER — MORPHINE SULFATE 2 MG/ML IJ SOLN
INTRAMUSCULAR | Status: AC
  Filled 2014-07-11: qty 1

## 2014-07-11 MED ORDER — ALBUTEROL SULFATE (2.5 MG/3ML) 0.083% IN NEBU: 2.5000 mg | INHALATION_SOLUTION | RESPIRATORY_TRACT | Status: DC | PRN

## 2014-07-11 MED ORDER — METOPROLOL TARTRATE 1 MG/ML IV SOLN
2.0000 mg | INTRAVENOUS | Status: DC | PRN
  Filled 2014-07-11: qty 5

## 2014-07-11 MED ORDER — MORPHINE SULFATE 2 MG/ML IJ SOLN
2.0000 mg | Freq: Once | INTRAMUSCULAR | Status: AC
  Administered 2014-07-11: 2 mg via INTRAVENOUS

## 2014-07-11 MED ORDER — ALBUTEROL SULFATE (2.5 MG/3ML) 0.083% IN NEBU
2.5000 mg | INHALATION_SOLUTION | Freq: Three times a day (TID) | RESPIRATORY_TRACT | Status: DC
  Administered 2014-07-11 – 2014-07-12 (×3): 2.5 mg via RESPIRATORY_TRACT
  Filled 2014-07-11 (×4): qty 3

## 2014-07-11 NOTE — Progress Notes (Addendum)
  [facility] Progress Note  07/11/2014 8:19 AM POD 2   Subjective:  Lethargic this morning. Denies pain.   Tmax 97.6 BP sys 130s-160s 02 94% 5L Filed Vitals:   07/11/14 0801  BP: 169/69  Pulse: 76  Temp: 97.6 F (36.4 C)  Resp: 33    Physical Exam: Incisions:  Minimal bleeding towards center of staple line. No hematoma.    CBC    Component Value Date/Time   WBC 25.6* 07/11/2014 0330   RBC 3.34* 07/11/2014 0330   HGB 9.7* 07/11/2014 0330   HCT 30.2* 07/11/2014 0330   PLT 253 07/11/2014 0330   MCV 90.4 07/11/2014 0330   MCH 29.0 07/11/2014 0330   MCHC 32.1 07/11/2014 0330   RDW 18.5* 07/11/2014 0330   LYMPHSABS 2.2 02/21/2014 1030   MONOABS 1.1* 02/21/2014 1030   EOSABS 0.2 02/21/2014 1030   BASOSABS 0.1 02/21/2014 1030    BMET    Component Value Date/Time   NA 148* 07/11/2014 0330   K 3.3* 07/11/2014 0330   CL 112 07/11/2014 0330   CO2 14* 07/11/2014 0330   GLUCOSE 154* 07/11/2014 0330   BUN 24* 07/11/2014 0330   CREATININE 1.41* 07/11/2014 0330   CREATININE 1.01 11/21/2013 1506   CALCIUM 8.7 07/11/2014 0330   GFRNONAA 40* 07/11/2014 0330   GFRNONAA 62 11/21/2013 1506   GFRAA 46* 07/11/2014 0330   GFRAA 71 11/21/2013 1506    INR    Component Value Date/Time   INR 1.01 01/10/2014 1534     Intake/Output Summary (Last 24 hours) at 07/11/14 0819 Last data filed at 07/11/14 0456  Gross per 24 hour  Intake    720 ml  Output   1375 ml  Net   -655 ml     Assessment/Plan:  58 y.o. female is s/p right below knee amputation  POD 2  Dressing taken down today and changed. Had some minor bleeding towards middle of staple line. No hematoma. Stump looks viable.    Virgina Jock, PA-C [facility] Office: [phone] Pager: [phone] 07/11/2014 8:19 AM            I have examined the patient, reviewed and agree with above.  EARLY, TODD, MD 07/11/2014 11:36 AM

## 2014-07-11 NOTE — Progress Notes (Signed)
Patient ID: [name], female   DOB: 1956-09-27, 58 y.o.   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 albuterol  2.5 mg Nebulization TID  . antiseptic oral rinse  7 mL Mouth Rinse BID  . aspirin EC  81 mg Oral Daily  . atorvastatin  10 mg Oral QHS  . chlorhexidine  15 mL Mouth Rinse BID  . docusate sodium  100 mg Oral Daily  . heparin  5,000 Units Subcutaneous 3 times per day  . insulin aspart  0-9 Units Subcutaneous TID WC  . lipase/protease/amylase  24,000 Units Oral TID WC  . loratadine  10 mg Oral Daily  . morphine      . multivitamin with minerals  1 tablet Oral Daily  . ondansetron (ZOFRAN) IV  4 mg Intravenous TID AC  . pantoprazole (PROTONIX) IV  40 mg Intravenous Q24H  . piperacillin-tazobactam (ZOSYN)  IV  3.375 g Intravenous Q8H  . polyethylene glycol  17 g Oral BID  . potassium chloride  40 mEq Oral BID  . senna  2 tablet Oral QHS  . sertraline  100 mg Oral Daily  . sodium bicarbonate  1,300 mg Oral BID  . sodium chloride  3 mL Intravenous Q12H  . traZODone  100 mg Oral Once  . 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  Lab Results Lab Results  Component Value Date   WBC 25.6* 07/11/2014   HGB 9.7* 07/11/2014   HCT 30.2* 07/11/2014   MCV 90.4 07/11/2014   PLT 253 07/11/2014    Lab Results  Component Value Date   CREATININE 1.41* 07/11/2014   BUN 24* 07/11/2014   NA 148* 07/11/2014   K 3.3* 07/11/2014   CL 112 07/11/2014   CO2 14* 07/11/2014    Lab Results  Component Value Date   ALT 17 07/03/2014   AST 25 06/28/2014   ALKPHOS 157* 06/28/2014   BILITOT <0.2* 06/13/2014      Microbiology: Recent Results (from the past 240 hour(s))  CULTURE, BLOOD (ROUTINE X 2)     Status: None   Collection Time    06/07/2014  2:40 PM      Result Value Ref Range Status   Specimen Description BLOOD LEFT HAND   Final   Special Requests BOTTLES DRAWN AEROBIC AND ANAEROBIC 2CC   Final   Culture  Setup Time     Final   Value: 07/05/2014 21:54     Performed at [facility]   Culture     Final   Value: NO GROWTH 5 DAYS     Performed at [facility]   Report Status 07/10/2014 FINAL   Final  MRSA PCR SCREENING     Status: Abnormal   Collection Time    06/06/2014  4:46 PM      Result Value Ref Range Status   MRSA by PCR POSITIVE (*) NEGATIVE Final   Comment:            The GeneXpert MRSA Assay (FDA     approved for NASAL specimens     only), is one component of a     comprehensive MRSA colonization     surveillance program. It is not     intended to diagnose MRSA     infection nor to guide or     monitor treatment for     MRSA infections.     RESULT CALLED TO, READ BACK BY AND VERIFIED WITH:     Durenda Age RN 19:05 06/07/2014 ([email or web address])  CULTURE, BLOOD (ROUTINE X 2)     Status: None   Collection Time    06/28/2014  6:12 PM      Result Value Ref Range Status   Specimen Description BLOOD HAND RIGHT   Final   Special Requests BOTTLES DRAWN AEROBIC AND ANAEROBIC 2CC   Final   Culture  Setup Time     Final   Value: 07/05/2014 00:36       Performed at [facility]   Culture     Final   Value: NO GROWTH 5 DAYS     Performed at [facility]   Report Status 07/11/2014 FINAL   Final  URINE CULTURE     Status: None   Collection Time    06/17/2014 10:40 PM      Result Value Ref Range Status   Specimen Description URINE, RANDOM   Final   Special Requests NONE   Final   Culture  Setup Time     Final   Value: 06/07/2014 22:57     Performed at [facility]     Final   Value: NO GROWTH     Performed at [facility]   Culture     Final   Value: NO GROWTH     Performed at [facility]   Report Status 07/05/2014 FINAL   Final  CULTURE, BLOOD (ROUTINE X 2)     Status: None   Collection Time    07/10/14  9:20 AM      Result Value Ref Range Status   Specimen Description BLOOD RIGHT ARM   Final   Special Requests BOTTLES DRAWN AEROBIC ONLY 5CC   Final   Culture  Setup Time     Final   Value: 07/10/2014 15:54     Performed at [facility]   Culture     Final   Value:        BLOOD CULTURE RECEIVED NO GROWTH TO DATE CULTURE WILL BE HELD FOR 5 DAYS BEFORE ISSUING A FINAL NEGATIVE REPORT     Performed at [facility]   Report Status PENDING   Incomplete  CULTURE, BLOOD (ROUTINE X 2)     Status: None   Collection Time    07/10/14  9:31 AM  Result Value Ref Range Status   Specimen Description BLOOD LEFT HAND   Final   Special Requests BOTTLES DRAWN AEROBIC AND ANAEROBIC 10CC   Final   Culture  Setup Time     Final   Value: 07/10/2014 15:54     Performed at [facility]   Culture     Final   Value:        BLOOD CULTURE RECEIVED NO GROWTH TO DATE CULTURE WILL BE HELD FOR 5 DAYS BEFORE ISSUING A FINAL NEGATIVE REPORT     Performed at [facility]   Report Status PENDING   Incomplete  MRSA PCR SCREENING     Status: None   Collection Time    07/10/14  4:45 PM      Result Value Ref Range Status   MRSA by PCR NEGATIVE  NEGATIVE Final    Comment:            The GeneXpert MRSA Assay (FDA     approved for NASAL specimens     only), is one component of a     comprehensive MRSA colonization     surveillance program. It is not     intended to diagnose MRSA     infection nor to guide or     monitor treatment for     MRSA infections.    Studies/Results: Dg Chest Port 1 View  07/11/2014   CLINICAL DATA:  Shortness of breath  EXAM: PORTABLE CHEST - 1 VIEW  COMPARISON:  07/10/2014  FINDINGS: Bibasilar opacities with air bronchograms best seen in the retrocardiac lung. Questionable improvement in right basilar aeration. Pulmonary venous congestion. Borderline cardiomegaly. Negative upper mediastinal contours. No evidence of increasing pleural fluid. No pneumothorax.  IMPRESSION: Persistent basilar lung opacities which could reflect pneumonia or atelectasis.   Electronically Signed   By: Jorje Guild M.D.   On: 07/11/2014 05:16   Dg Chest Port 1 View  07/10/2014   CLINICAL DATA:  Shortness of breath  EXAM: PORTABLE CHEST - 1 VIEW  COMPARISON:  06/23/2014  FINDINGS: Cardiac shadow is stable. Increased vascular congestion is noted as well as some right basilar infiltrate. No sizable effusion is seen. No bony abnormality is noted.  IMPRESSION: Vascular congestion and right basilar infiltrate. Continued followup is recommended.   Electronically Signed   By: Inez Catalina M.D.   On: 07/10/2014 08:04    Assessment: Yesterday she was lethargic and had worsening leukocytosis. Her chest x-ray revealed some bibasilar densities. She was started on broad empiric therapy for possible healthcare associated pneumonia. Her renal insufficiency has improved slightly. She is currently unable to consistently swallow oral medications. I will hold off on restarting antiretroviral therapy.  Plan: 1. Agree with empiric antibiotics for possible HCAP  Michel Bickers, MD [facility] [phone] pager   [phone]  cell 07/11/2014, 11:31 AM

## 2014-07-11 NOTE — Progress Notes (Addendum)
INITIAL NUTRITION ASSESSMENT  DOCUMENTATION CODES Per approved criteria  -Obesity Unspecified    INTERVENTION: Advance diet as medically appropriate, add interventions accordingly RD to follow for nutrition care plan  NUTRITION DIAGNOSIS: Increased nutrient needs related to post-op healing as evidenced by estimated nutrition needs  Goal: Pt to meet >/= 90% of their estimated nutrition needs   Monitor:  PO diet advancement & intake, weight, labs, I/O's  Reason for Assessment: Low Braden  58 y.o. female  Admitting Dx: Acute kidney injury  ASSESSMENT: 58 yo female with PMH of left BKA, HIV, HTN, DM; admitted on 7/29 for right foot pain. She was scheduled on day of admission to undergo an arteriogram however was found to be in ARF and admitted to the hospitalist service.   Patient s/p procedure 8/3: RIGHT BELOW THE KNEE AMPUTATION   RD unable to obtain nutrition hx at this time; pt lethargic; s/p swallow evaluation 8/4 -- pt with reduced awareness and acceptance of PO with food pocketing; SLP recommending NPO status at this time; nutrient needs increased given post-op state; would benefit from addition of oral nutrition supplements when/ able.  Low braden score places patient at risk for skin breakdown.   No muscle or subcutaneous fat depletion noticed.  Height: Ht Readings from Last 1 Encounters:  07/10/14 5\' 4"  (1.626 m)    Weight: Wt Readings from Last 1 Encounters:  07/11/14 162 lb 4.1 oz (73.6 kg)    Ideal Body Weight: 107 lb -- adjusted for amputations   % Ideal Body Weight: 150%  Wt Readings from Last 10 Encounters:  07/11/14 162 lb 4.1 oz (73.6 kg)  07/11/14 162 lb 4.1 oz (73.6 kg)  07/11/14 162 lb 4.1 oz (73.6 kg)  06/26/14 130 lb (58.968 kg)  06/19/14 130 lb (58.968 kg)  05/30/14 138 lb (62.596 kg)  04/18/14 140 lb (63.504 kg)  03/13/14 140 lb (63.504 kg)  03/01/14 140 lb (63.504 kg)  02/28/14 140 lb (63.504 kg)    Usual Body Weight: 130 lb  %  Usual Body Weight: 125%  BMI:  31.6 kg/m2 -- adjusted for amputations   Estimated Nutritional Needs: Kcal: 1800-2000 Protein: 90-100 gm Fluid: 1.8-2.0 L  Skin: Intact  Diet Order: NPO  EDUCATION NEEDS: -No education needs identified at this time   Intake/Output Summary (Last 24 hours) at 07/11/14 1147 Last data filed at 07/11/14 1023  Gross per 24 hour  Intake    823 ml  Output   1375 ml  Net   -552 ml    Labs:   Recent Labs Lab 06/14/2014 1530  07/05/14 0720  07/10/14 0520 07/10/14 2213 07/11/14 0330  NA  --   < > 145  < > 146 149* 148*  K  --   < > 3.0*  < > 3.3* 2.8* 3.3*  CL  --   < > 116*  < > 112 113* 112  CO2  --   < > 12*  < > 12* 14* 14*  BUN  --   < > 18  < > 23 24* 24*  CREATININE  --   < > 1.98*  < > 1.34* 1.42* 1.41*  CALCIUM  --   < > 8.1*  < > 8.5 8.7 8.7  MG 2.3  --   --   --   --   --  2.0  PHOS  --   --  2.5  --   --   --   --   GLUCOSE  --   < >  209*  < > 177* 94 154*  < > = values in this interval not displayed.  CBG (last 3)   Recent Labs  07/10/14 2357 07/11/14 0512 07/11/14 0802  GLUCAP 117* 158* 167*    Scheduled Meds: . albuterol  2.5 mg Nebulization TID  . antiseptic oral rinse  7 mL Mouth Rinse BID  . aspirin EC  81 mg Oral Daily  . atorvastatin  10 mg Oral QHS  . chlorhexidine  15 mL Mouth Rinse BID  . docusate sodium  100 mg Oral Daily  . heparin  5,000 Units Subcutaneous 3 times per day  . insulin aspart  0-9 Units Subcutaneous TID WC  . lipase/protease/amylase  24,000 Units Oral TID WC  . loratadine  10 mg Oral Daily  . morphine      . multivitamin with minerals  1 tablet Oral Daily  . ondansetron (ZOFRAN) IV  4 mg Intravenous TID AC  . pantoprazole (PROTONIX) IV  40 mg Intravenous Q24H  . piperacillin-tazobactam (ZOSYN)  IV  3.375 g Intravenous Q8H  . polyethylene glycol  17 g Oral BID  . potassium chloride  40 mEq Oral BID  . senna  2 tablet Oral QHS  . sertraline  100 mg Oral Daily  . sodium bicarbonate   1,300 mg Oral BID  . sodium chloride  3 mL Intravenous Q12H  . traZODone  100 mg Oral Once  . vancomycin  750 mg Intravenous Q24H    Continuous Infusions: . dextrose 5 % 1,000 mL with potassium chloride 40 mEq, sodium bicarbonate 100 mEq infusion    . dextrose 5 % 880 mL with potassium chloride 40 mEq, sodium bicarbonate 100 mEq infusion 40 mL/hr at 07/11/14 1000    Past Medical History  Diagnosis Date  . Depression   . Neuropathy   . Mood disorder   . TIA (transient ischemic attack)   . Pancreatitis chronic   . Chronic pain   . HIV (human immunodeficiency virus infection) 1990's    takes Norpramin nightly  . Polysubstance abuse   . Cardiomyopathy, nonischemic     EF 45%  . Peripheral vascular disease   . Asthma     "used to when I was young" (02/20/2013)  . Arthritis     "legs and arms" (02/20/2013)  . Hypercholesterolemia     takes Lipitor nightly  . Seasonal allergies     takes Zyrtec daily  . Anemia     takes Iron pill daily  . Nausea     takes Zofran prn  . GERD (gastroesophageal reflux disease)     takes Prilosec daily  . Insomnia     takes Trazodone nightly  . Constipation     takes miralax prn and stool softener daily  . Type II diabetes mellitus     lantus 45units at bedtime  . Dysrhythmia     takes Metoprolol daily  . Weakness of both legs   . Peripheral neuropathy   . Chronic back pain   . Hepatitis C     C  . Urinary frequency   . Nocturia     Past Surgical History  Procedure Laterality Date  . Carotid endarterectomy Right   . Tubal ligation    . Lower extremity angiogram    . Toe amputation    . Amputation Left 04/28/2013    Procedure: AMPUTATION DIGIT;  Surgeon: Rosetta Posner, MD;  Location: [facility];  Service: Vascular;  Laterality: Left;  left 2nd toe  amputation  . Amputation Left 05/10/2013    Procedure: AMPUTATION BELOW KNEE;  Surgeon: Rosetta Posner, MD;  Location: [facility];  Service: Vascular;  Laterality: Left;  . Cataract extraction w/phaco  Right 02/27/2014    Procedure: CATARACT EXTRACTION PHACO AND INTRAOCULAR LENS PLACEMENT (IOC);  Surgeon: Elta Guadeloupe T. Gershon Crane, MD;  Location: [facility];  Service: Ophthalmology;  Laterality: Right;  CDE:  6.77  . Cataract extraction w/phaco Left 03/20/2014    Procedure: CATARACT EXTRACTION PHACO AND INTRAOCULAR LENS PLACEMENT (IOC);  Surgeon: Elta Guadeloupe T. Gershon Crane, MD;  Location: [facility];  Service: Ophthalmology;  Laterality: Left;  CDE:6.98    Arthur Holms, RD, LDN Pager #: [phone] After-Hours Pager #: [phone]

## 2014-07-11 NOTE — Progress Notes (Addendum)
PATIENT DETAILS Name: [name] Age: 58 y.o. Sex: female Date of Birth: 1956/03/02 Admit Date: 06/13/2014 Admitting Physician Evalee Mutton Kristeen Mans, MD PCP:Pcp Not In System  Brief Summary Patient is a 58 yo female with hx of PAD-s/p Left BKA, HIV, HTN, DM admitted on 7/29 for right foot pain. She was scheduled on day of admission to undergo an arteriogram however was found to be in ARF, and admitted to the hospitalist service. Her hospital course has been complicated by development of Nausea/vomting and hypokalemia/met acidosis. She subsequently underwent Right BKA on 8/3, however on 8/4-she was found to be lethargic, with leukocytosis. A CXR showed CHF and PNA.Current suspicion is for aspiration PNA given clinical setting. She was transferred to SDU for closer monitoring on 07/10/14.   Subjective: More awake compared to yesterday.Denies any pain today  Assessment/Plan: Acute kidney injury  -Likely secondary to dehydration,lisinopril -creatinine down trending. -Continue to hold Lisinopril  HCAP/Aspiration PNA -Post operatively was very drowsy-recent vomiting a few days back as well-suspected Aspiration PNA. Still with Leukocytosis but now downtrending somewhat. CXR 8/4 confirms RLL infiltrate. Blood culture  8/4 negative so far, started empirically on IV Vanco/Zosyn-day 2. Await swallow eval.  Acute Encephalopathy -multifactorial-secondary to PNA, Narcotics -still drowsy but better than yesterday-answering questions appropriately.Stop all narcotics for now. Utilize tylenol for pain. Non focal exam, speech slow but clear.  If no improvement, will get imaging of head-but since improving will continue to follow closely  Metabolic Acidosis  -Suspect secondary to renal failure and ischemic leg-and also from ?developing sepsis from PNA  - Change to oral Bicarbonate when able . Lactic acid levels normal and no abdominal pain.  Nausea with vomiting  -?etiology-but seems to have  resolved.-belly is soft, Xray abd done on 8/1-neg for SBO, c/w Zofran  Hypokalemia  -likely secondary to GI loss-vomiting   -will continue to replete and recheck in am  Peripheral Vascular Disease with right foot pain  -s/p BKA on 07/13/2014. Will monitor closely post operatively, VVS following -stop all narcotics for now given drowsiness/encephalopathy  Hypotension  -suspect secondary to hypovolumia  -resolved with IVF   Hypoglycemia  -occurred on admission-secondary to NPO status/ARF, CBG's stable-was on Lantus 15 units with SSI-since NPO for swallow eval-stop Lantus and just place on SSI. Once oral intake resumed, Lantus will need to be restarted  HTN  -since NPO for swallow eval-stop Toprol for now, resume when oral intake resumed. Currenly on prn Metoprolol IV. No ACEI for now  -follow BP trend and adjust accordingly   Diabetes Mellitus Type 2  -CBG's stable-c/w 15 units and SSI-since NPO for swallow eval-stop Lantus and just place on SSI. Once oral intake resumed, Lantus will need to be restarted  B/L Toothache -resolved, if recurrs check panoromic xray  Insomnia  -supportive care with Ambien  -Encouraged sleep hygiene and relaxation   Depression  Continue zoloft   HIV & Hepatitis C  ID consulted.  Per Dr. Megan Salon, "Continue to hold antiretroviral therapy until her renal function stabilizes; Hold Truvada and Tivicay, Check CD4 count and HIV viral load, Check hepatitis C viral load"  Hx of Chronic Pancreatitis -resume Creon when oral intake resumed  Disposition: Remain inpatient-remain in SDU for closer monitoring.   DVT Prophylaxis: Prophylactic Heparin   Code Status: Full code   Family Communication [name]-sister over the phone  Procedures:  None  CONSULTS:  ID and vascular surgery   MEDICATIONS: Scheduled Meds: . antiseptic oral  rinse  7 mL Mouth Rinse BID  . aspirin EC  81 mg Oral Daily  . atorvastatin  10 mg Oral QHS  .  chlorhexidine  15 mL Mouth Rinse BID  . docusate sodium  100 mg Oral Daily  . heparin  5,000 Units Subcutaneous 3 times per day  . insulin aspart  0-9 Units Subcutaneous TID WC  . lipase/protease/amylase  24,000 Units Oral TID WC  . loratadine  10 mg Oral Daily  . morphine      . multivitamin with minerals  1 tablet Oral Daily  . ondansetron (ZOFRAN) IV  4 mg Intravenous TID AC  . pantoprazole (PROTONIX) IV  40 mg Intravenous Q24H  . piperacillin-tazobactam (ZOSYN)  IV  3.375 g Intravenous Q8H  . polyethylene glycol  17 g Oral BID  . potassium chloride  10 mEq Intravenous Q1 Hr x 2  . potassium chloride  40 mEq Oral BID  . senna  2 tablet Oral QHS  . sertraline  100 mg Oral Daily  . sodium bicarbonate  1,300 mg Oral BID  . sodium chloride  3 mL Intravenous Q12H  . sodium phosphate  1 enema Rectal Once  . traZODone  100 mg Oral Once  . vancomycin  750 mg Intravenous Q24H   Continuous Infusions: . dextrose 5 % 1,000 mL with potassium chloride 40 mEq, sodium bicarbonate 100 mEq infusion     PRN Meds:.acetaminophen, acetaminophen, alum & mag hydroxide-simeth, bisacodyl, guaiFENesin-dextromethorphan, hydrALAZINE, lipase/protease/amylase, LORazepam, magnesium sulfate 1 - 4 g bolus IVPB, metoprolol, ondansetron, phenol, promethazine, zolpidem  Antibiotics: Anti-infectives   Start     Dose/Rate Route Frequency Ordered Stop   07/10/14 1200  piperacillin-tazobactam (ZOSYN) IVPB 3.375 g     3.375 g 12.5 mL/hr over 240 Minutes Intravenous Every 8 hours 07/10/14 0844     07/10/14 1000  vancomycin (VANCOCIN) IVPB 750 mg/150 ml premix     750 mg 150 mL/hr over 60 Minutes Intravenous Every 24 hours 07/10/14 0855     07/21/2014 2200  cefUROXime (ZINACEF) 1.5 g in dextrose 5 % 50 mL IVPB     1.5 g 100 mL/hr over 30 Minutes Intravenous Every 12 hours 07/20/2014 1423 07/10/14 1123   07/29/2014 0600  [MAR Hold]  cefUROXime (ZINACEF) 1.5 g in dextrose 5 % 50 mL IVPB     (On MAR Hold since 07/10/2014 0923)     1.5 g 100 mL/hr over 30 Minutes Intravenous On call to O.R. 07/06/14 1047 07/27/2014 1034   07/05/14 1000  emtricitabine-tenofovir (TRUVADA) 200-300 MG per tablet 1 tablet  Status:  Discontinued     1 tablet Oral Daily 06/29/2014 1616 07/03/2014 1839   07/05/2014 1700  dolutegravir (TIVICAY) tablet 50 mg  Status:  Discontinued     50 mg Oral Daily 07/01/2014 1616 06/10/2014 1839       PHYSICAL EXAM: Vital signs in last 24 hours: Filed Vitals:   07/11/14 0500 07/11/14 0600 07/11/14 0700 07/11/14 0801  BP:  166/67  169/69  Pulse:  77 79 76  Temp:    97.6 F (36.4 C)  TempSrc:    Oral  Resp:  15 19 33  Height:      Weight: 73.6 kg (162 lb 4.1 oz)     SpO2:  95% 95% 94%    Weight change:  Filed Weights   06/16/2014 1616 07/10/14 1620 07/11/14 0500  Weight: 58.968 kg (130 lb) 74.9 kg (165 lb 2 oz) 73.6 kg (162 lb 4.1 oz)  Body mass index is 27.84 kg/(m^2).   Gen Exam: Awake,drowsy. Follows commmands.Slow but clear speech   Neck: Supple, No JVD.   Chest: B/L Clear.Bibasilar rales CVS: S1 S2 Regular, no murmurs.  Abdomen: soft, BS +, non tender, non distended.  Extremities: Extremities: no edema, left BKA healed completely, s/p Right BKA Neurologic: Non Focal.   Skin: No Rash.   Wounds: N/A.    Intake/Output from previous day:  Intake/Output Summary (Last 24 hours) at 07/11/14 0932 Last data filed at 07/11/14 0240  Gross per 24 hour  Intake    820 ml  Output   1375 ml  Net   -555 ml     LAB RESULTS: CBC  Recent Labs Lab 07/05/14 0720 07/07/14 1145 07/08/14 2344 07/10/14 0520 07/11/14 0330  WBC 12.7* 14.2* 19.2* 28.0* 25.6*  HGB 10.9* 11.3* 11.4* 10.3* 9.7*  HCT 34.0* 33.7* 34.7* 31.8* 30.2*  PLT 238 213 245 250 253  MCV 90.9 91.6 91.6 93.5 90.4  MCH 29.1 30.7 30.1 30.3 29.0  MCHC 32.1 33.5 32.9 32.4 32.1  RDW 19.1* 18.3* 18.5* 18.5* 18.5*    Chemistries   Recent Labs Lab 07/03/2014 1530  07/08/14 1601 07/08/14 2344 07/10/14 0520 07/10/14 2213  07/11/14 0330  NA  --   < > 144 141 146 149* 148*  K  --   < > 3.2* 3.3* 3.3* 2.8* 3.3*  CL  --   < > 107 105 112 113* 112  CO2  --   < > 20 18* 12* 14* 14*  GLUCOSE  --   < > 176* 117* 177* 94 154*  BUN  --   < > 26* 23 23 24* 24*  CREATININE  --   < > 1.53* 1.32* 1.34* 1.42* 1.41*  CALCIUM  --   < > 8.9 8.5 8.5 8.7 8.7  MG 2.3  --   --   --   --   --  2.0  < > = values in this interval not displayed.  CBG:  Recent Labs Lab 07/10/14 1809 07/10/14 2054 07/10/14 2357 07/11/14 0512 07/11/14 0802  GLUCAP 167* 94 117* 158* 167*    GFR Estimated Creatinine Clearance: 42.3 ml/min (by C-G formula based on Cr of 1.41).  Coagulation profile No results found for this basename: INR, PROTIME,  in the last 168 hours  Cardiac Enzymes No results found for this basename: CK, CKMB, TROPONINI, MYOGLOBIN,  in the last 168 hours  No components found with this basename: POCBNP,  No results found for this basename: DDIMER,  in the last 72 hours No results found for this basename: HGBA1C,  in the last 72 hours No results found for this basename: CHOL, HDL, LDLCALC, TRIG, CHOLHDL, LDLDIRECT,  in the last 72 hours No results found for this basename: TSH, T4TOTAL, FREET3, T3FREE, THYROIDAB,  in the last 72 hours No results found for this basename: VITAMINB12, FOLATE, FERRITIN, TIBC, IRON, RETICCTPCT,  in the last 72 hours No results found for this basename: LIPASE, AMYLASE,  in the last 72 hours  Urine Studies No results found for this basename: UACOL, UAPR, USPG, UPH, UTP, UGL, UKET, UBIL, UHGB, UNIT, UROB, ULEU, UEPI, UWBC, URBC, UBAC, CAST, CRYS, UCOM, BILUA,  in the last 72 hours  MICROBIOLOGY: Recent Results (from the past 240 hour(s))  CULTURE, BLOOD (ROUTINE X 2)     Status: None   Collection Time    06/11/2014  2:40 PM      Result Value Ref Range  Status   Specimen Description BLOOD LEFT HAND   Final   Special Requests BOTTLES DRAWN AEROBIC AND ANAEROBIC 2CC   Final   Culture  Setup  Time     Final   Value: 06/24/2014 21:54     Performed at [facility]   Culture     Final   Value: NO GROWTH 5 DAYS     Performed at [facility]   Report Status 07/10/2014 FINAL   Final  MRSA PCR SCREENING     Status: Abnormal   Collection Time    06/20/2014  4:46 PM      Result Value Ref Range Status   MRSA by PCR POSITIVE (*) NEGATIVE Final   Comment:            The GeneXpert MRSA Assay (FDA     approved for NASAL specimens     only), is one component of a     comprehensive MRSA colonization     surveillance program. It is not     intended to diagnose MRSA     infection nor to guide or     monitor treatment for     MRSA infections.     RESULT CALLED TO, READ BACK BY AND VERIFIED WITH:     Durenda Age RN 19:05 06/17/2014 ([email or web address])  CULTURE, BLOOD (ROUTINE X 2)     Status: None   Collection Time    06/25/2014  6:12 PM      Result Value Ref Range Status   Specimen Description BLOOD HAND RIGHT   Final   Special Requests BOTTLES DRAWN AEROBIC AND ANAEROBIC 2CC   Final   Culture  Setup Time     Final   Value: 07/05/2014 00:36     Performed at [facility]   Culture     Final   Value: NO GROWTH 5 DAYS     Performed at [facility]   Report Status 07/11/2014 FINAL   Final  URINE CULTURE     Status: None   Collection Time    07/03/2014 10:40 PM      Result Value Ref Range Status   Specimen Description URINE, RANDOM   Final   Special Requests NONE   Final   Culture  Setup Time     Final   Value: 06/06/2014 22:57     Performed at [facility]     Final   Value: NO GROWTH     Performed at [facility]   Culture     Final   Value: NO GROWTH     Performed at [facility]   Report Status 07/05/2014 FINAL   Final  CULTURE, BLOOD (ROUTINE X 2)     Status: None   Collection Time    07/10/14  9:20 AM      Result Value Ref Range Status   Specimen Description BLOOD RIGHT ARM   Final   Special Requests BOTTLES DRAWN  AEROBIC ONLY 5CC   Final   Culture  Setup Time     Final   Value: 07/10/2014 15:54     Performed at [facility]   Culture     Final   Value:        BLOOD CULTURE RECEIVED NO GROWTH TO DATE CULTURE WILL BE HELD FOR 5 DAYS BEFORE ISSUING A FINAL NEGATIVE REPORT     Performed at [facility]   Report Status  PENDING   Incomplete  CULTURE, BLOOD (ROUTINE X 2)     Status: None   Collection Time    07/10/14  9:31 AM      Result Value Ref Range Status   Specimen Description BLOOD LEFT HAND   Final   Special Requests BOTTLES DRAWN AEROBIC AND ANAEROBIC 10CC   Final   Culture  Setup Time     Final   Value: 07/10/2014 15:54     Performed at [facility]   Culture     Final   Value:        BLOOD CULTURE RECEIVED NO GROWTH TO DATE CULTURE WILL BE HELD FOR 5 DAYS BEFORE ISSUING A FINAL NEGATIVE REPORT     Performed at [facility]   Report Status PENDING   Incomplete  MRSA PCR SCREENING     Status: None   Collection Time    07/10/14  4:45 PM      Result Value Ref Range Status   MRSA by PCR NEGATIVE  NEGATIVE Final   Comment:            The GeneXpert MRSA Assay (FDA     approved for NASAL specimens     only), is one component of a     comprehensive MRSA colonization     surveillance program. It is not     intended to diagnose MRSA     infection nor to guide or     monitor treatment for     MRSA infections.    RADIOLOGY STUDIES/RESULTS: Dg Chest Port 1 View  06/08/2014   CLINICAL DATA:  Acidosis  EXAM: PORTABLE CHEST - 1 VIEW  COMPARISON:  02/20/2013  FINDINGS: There are bilateral chronic bronchitic changes. There is no focal parenchymal opacity, pleural effusion, or pneumothorax. The heart and mediastinal contours are unremarkable.  The osseous structures are unremarkable.  IMPRESSION: No active disease.   Electronically Signed   By: Kathreen Devoid   On: 06/28/2014 14:58    Oren Binet, MD  [facility] Pager:[phone]  If 7PM-7AM, please  contact night-coverage [email or web address] Password [email or web address] 07/11/2014, 9:32 AM   LOS: 7 days   **Disclaimer: This note may have been dictated with voice recognition software. Similar sounding words can inadvertently be transcribed and this note may contain transcription errors which may not have been corrected upon publication of note.**

## 2014-07-11 NOTE — Progress Notes (Signed)
Speech Language Pathology Treatment: Dysphagia  Patient Details Name: [name] MRN: [id number] DOB: Jun 01, 1956 Today's Date: 07/11/2014 Time: 3343-5686 SLP Time Calculation (min): 17 min  Assessment / Plan / Recommendation Clinical Impression  Patient was more alert upon initiation of PO trials, although fatigued quickly. She demonstrated increased oral acceptance and lingual manipulation with ice chips, thin liquids, and applesauce without overt coughing, although with consistent change in respirations (SpO2 to 85%, RR up to the 30s; RN present and aware) concerning for possible silent aspiration. Voice was noted to be hoarse today. As patient fatigued she required increased cueing for oral clearance, with increased oral holding noted when RN attempted to administer medication crushed in puree. SLP ultimately suctioned applesauce and pills from pt's oral cavity, as pt did not initiate posterior propulsion despite Max multimodal cueing. Will continue to follow to assess need for PO readiness versus objective testing.   HPI HPI: 58 yo female with hx of PAD-s/p Left BKA, HIV, HTN, DM admitted on 7/29 for right foot pain. She was scheduled on day of admission to undergo an arteriogram however was found to be in ARF, and admitted to the hospitalist service. Her hospital course has been complicated by development of Nausea/vomting and hypokalemia/met acidosis. She subsequently underwent Right BKA on 8/3, however on 8/4-she was found to be lethargic, with leukocytosis. A CXR showed CHF and PNA.Current suspicion is for aspiration PNA given clinical setting.   Pertinent Vitals See vitals as documented above  SLP Plan  Continue with current plan of care    Recommendations Diet recommendations: NPO Medication Administration: Via alternative means              Oral Care Recommendations: Oral care Q4 per protocol;Oral care prior to ice chips Follow up Recommendations: Skilled Nursing facility;24 hour  supervision/assistance Plan: Continue with current plan of care    GO      Germain Osgood, M.A. CCC-SLP [phone]  Germain Osgood 07/11/2014, 12:21 PM

## 2014-07-11 NOTE — Progress Notes (Signed)
Physical Therapy Re-evaluation and Treatment Patient Details Name: [name] MRN: [id number] DOB: [birth year] Today's Date: 07/11/2014    History of Present Illness Patient is a 58 y/o female admitted with right ankle pain, discoloration, coldness, N/V and AKI. PMH positive for HIV, DM type 2, Hep C, L BKA, depression, TIA and PVD. Pt had right BKA 07/22/2014.    PT Comments    Pt progressing towards physical therapy goals. Very lethargic during session which limited amount of functional mobility that was possible. Able to tolerate sitting balance EOB ~15 minutes during session. Will continue to follow and progress as able.   Follow Up Recommendations  Supervision/Assistance - 24 hour;SNF     Equipment Recommendations  None recommended by PT    Recommendations for Other Services       Precautions / Restrictions Precautions Precautions: Fall Restrictions Weight Bearing Restrictions: No Other Position/Activity Restrictions: pt owns prosthesis for LLE but does not wear it    Mobility  Bed Mobility Overal bed mobility: Needs Assistance;+2 for physical assistance Bed Mobility: Rolling;Sidelying to Sit;Sit to Sidelying Rolling: Mod assist Sidelying to sit: Mod assist;+2 for physical assistance     Sit to sidelying: Min assist General bed mobility comments: VC's for sequencing and technique. Able to reach for bed rails with hand-over-hand assist. Difficulty scooting to EOB, and bed pad used to assist pt with scooting, however able to maintain upright posture with min guard and occasional min assist for posterior lean.   Transfers                 General transfer comment: Unable at this time.   Ambulation/Gait                 Stairs            Wheelchair Mobility    Modified Rankin (Stroke Patients Only)       Balance Overall balance assessment: Needs assistance Sitting-balance support: Feet unsupported;No upper extremity supported Sitting  balance-Leahy Scale: Fair Sitting balance - Comments: Able to hold without UE support without assist for short periods of time.                             Cognition Arousal/Alertness: Awake/alert Behavior During Therapy: WFL for tasks assessed/performed Overall Cognitive Status: Within Functional Limits for tasks assessed                      Exercises      General Comments        Pertinent Vitals/Pain Vitals stable throughout session.     Home Living                      Prior Function            PT Goals (current goals can now be found in the care plan section) Acute Rehab PT Goals Patient Stated Goal: None stated during session.  PT Goal Formulation: With patient Time For Goal Achievement: 07/19/14 Potential to Achieve Goals: Good Progress towards PT goals: Progressing toward goals    Frequency  Min 2X/week    PT Plan Current plan remains appropriate    Co-evaluation             End of Session Equipment Utilized During Treatment: Oxygen Activity Tolerance: Patient limited by fatigue Patient left: in bed;with bed alarm set;with call bell/phone within reach;with nursing/sitter in room  Time: 1497-0263 PT Time Calculation (min): 27 min  Charges:  $Therapeutic Activity: 23-37 mins                    G Codes:      Jolyn Lent 2014-08-08, 4:42 PM  Jolyn Lent, PT, DPT Acute Rehabilitation Services Pager: [phone]

## 2014-07-12 ENCOUNTER — Inpatient Hospital Stay (HOSPITAL_COMMUNITY): Payer: Medicaid Other

## 2014-07-12 ENCOUNTER — Encounter (HOSPITAL_COMMUNITY): Payer: Self-pay | Admitting: Vascular Surgery

## 2014-07-12 DIAGNOSIS — J189 Pneumonia, unspecified organism: Secondary | ICD-10-CM

## 2014-07-12 DIAGNOSIS — G9340 Encephalopathy, unspecified: Secondary | ICD-10-CM

## 2014-07-12 DIAGNOSIS — J9600 Acute respiratory failure, unspecified whether with hypoxia or hypercapnia: Secondary | ICD-10-CM

## 2014-07-12 DIAGNOSIS — B182 Chronic viral hepatitis C: Secondary | ICD-10-CM

## 2014-07-12 DIAGNOSIS — IMO0002 Reserved for concepts with insufficient information to code with codable children: Secondary | ICD-10-CM

## 2014-07-12 DIAGNOSIS — I519 Heart disease, unspecified: Secondary | ICD-10-CM

## 2014-07-12 DIAGNOSIS — R7309 Other abnormal glucose: Secondary | ICD-10-CM

## 2014-07-12 DIAGNOSIS — I509 Heart failure, unspecified: Secondary | ICD-10-CM

## 2014-07-12 DIAGNOSIS — E1365 Other specified diabetes mellitus with hyperglycemia: Secondary | ICD-10-CM

## 2014-07-12 DIAGNOSIS — E1369 Other specified diabetes mellitus with other specified complication: Secondary | ICD-10-CM

## 2014-07-12 DIAGNOSIS — I5031 Acute diastolic (congestive) heart failure: Secondary | ICD-10-CM

## 2014-07-12 DIAGNOSIS — Z9981 Dependence on supplemental oxygen: Secondary | ICD-10-CM

## 2014-07-12 LAB — BASIC METABOLIC PANEL
ANION GAP: 29 — AB (ref 5–15)
Anion gap: 28 — ABNORMAL HIGH (ref 5–15)
Anion gap: 28 — ABNORMAL HIGH (ref 5–15)
Anion gap: 31 — ABNORMAL HIGH (ref 5–15)
Anion gap: 28 — ABNORMAL HIGH (ref 5–15)
Anion gap: 18 — ABNORMAL HIGH (ref 5–15)
BUN: 32 mg/dL — AB (ref 6–23)
BUN: 36 mg/dL — AB (ref 6–23)
BUN: 34 mg/dL — AB (ref 6–23)
BUN: 35 mg/dL — ABNORMAL HIGH (ref 6–23)
BUN: 33 mg/dL — AB (ref 6–23)
BUN: 32 mg/dL — AB (ref 6–23)
BUN: 26 mg/dL — ABNORMAL HIGH (ref 6–23)
CALCIUM: 8.7 mg/dL (ref 8.4–10.5)
CALCIUM: 7.9 mg/dL — AB (ref 8.4–10.5)
CHLORIDE: 116 meq/L — AB (ref 96–112)
CHLORIDE: 112 meq/L (ref 96–112)
CO2: 8 mEq/L — CL (ref 19–32)
CO2: <7 mEq/L — CL (ref 19–32)
CO2: 15 mEq/L — ABNORMAL LOW (ref 19–32)
CO2: 15 meq/L — AB (ref 19–32)
CO2: 16 mEq/L — ABNORMAL LOW (ref 19–32)
CO2: 18 meq/L — AB (ref 19–32)
CO2: 23 mEq/L (ref 19–32)
CREATININE: 2.02 mg/dL — AB (ref 0.50–1.10)
CREATININE: 2.05 mg/dL — AB (ref 0.50–1.10)
Calcium: 8.6 mg/dL (ref 8.4–10.5)
Calcium: 7.8 mg/dL — ABNORMAL LOW (ref 8.4–10.5)
Calcium: 7.8 mg/dL — ABNORMAL LOW (ref 8.4–10.5)
Calcium: 7.8 mg/dL — ABNORMAL LOW (ref 8.4–10.5)
Calcium: 6.8 mg/dL — ABNORMAL LOW (ref 8.4–10.5)
Chloride: 115 mEq/L — ABNORMAL HIGH (ref 96–112)
Chloride: 115 mEq/L — ABNORMAL HIGH (ref 96–112)
Chloride: 116 mEq/L — ABNORMAL HIGH (ref 96–112)
Chloride: 110 mEq/L (ref 96–112)
Chloride: 101 mEq/L (ref 96–112)
Creatinine, Ser: 1.79 mg/dL — ABNORMAL HIGH (ref 0.50–1.10)
Creatinine, Ser: 1.97 mg/dL — ABNORMAL HIGH (ref 0.50–1.10)
Creatinine, Ser: 1.93 mg/dL — ABNORMAL HIGH (ref 0.50–1.10)
Creatinine, Ser: 1.95 mg/dL — ABNORMAL HIGH (ref 0.50–1.10)
Creatinine, Ser: 1.56 mg/dL — ABNORMAL HIGH (ref 0.50–1.10)
GFR calc Af Amer: 35 mL/min — ABNORMAL LOW (ref >90)
GFR calc Af Amer: 31 mL/min — ABNORMAL LOW (ref >90)
GFR calc Af Amer: 32 mL/min — ABNORMAL LOW (ref >90)
GFR calc Af Amer: 31 mL/min — ABNORMAL LOW (ref >90)
GFR calc Af Amer: 41 mL/min — ABNORMAL LOW (ref >90)
GFR calc non Af Amer: 30 mL/min — ABNORMAL LOW (ref >90)
GFR calc non Af Amer: 27 mL/min — ABNORMAL LOW (ref >90)
GFR calc non Af Amer: 35 mL/min — ABNORMAL LOW (ref >90)
GFR, EST AFRICAN AMERICAN: 30 mL/min — AB (ref >90)
GFR, EST AFRICAN AMERICAN: 29 mL/min — AB (ref >90)
GFR, EST NON AFRICAN AMERICAN: 26 mL/min — AB (ref >90)
GFR, EST NON AFRICAN AMERICAN: 25 mL/min — AB (ref >90)
GFR, EST NON AFRICAN AMERICAN: 27 mL/min — AB (ref >90)
GFR, EST NON AFRICAN AMERICAN: 27 mL/min — AB (ref >90)
GLUCOSE: 234 mg/dL — AB (ref 70–99)
GLUCOSE: 258 mg/dL — AB (ref 70–99)
GLUCOSE: 761 mg/dL — AB (ref 70–99)
Glucose, Bld: 351 mg/dL — ABNORMAL HIGH (ref 70–99)
Glucose, Bld: 262 mg/dL — ABNORMAL HIGH (ref 70–99)
Glucose, Bld: 358 mg/dL — ABNORMAL HIGH (ref 70–99)
Glucose, Bld: 282 mg/dL — ABNORMAL HIGH (ref 70–99)
POTASSIUM: 2.9 meq/L — AB (ref 3.7–5.3)
POTASSIUM: 3.9 meq/L (ref 3.7–5.3)
Potassium: 3.2 mEq/L — ABNORMAL LOW (ref 3.7–5.3)
Potassium: 3 mEq/L — ABNORMAL LOW (ref 3.7–5.3)
Potassium: <2.2 mEq/L — CL (ref 3.7–5.3)
SODIUM: 160 meq/L — AB (ref 137–147)
Sodium: 152 mEq/L — ABNORMAL HIGH (ref 137–147)
Sodium: 155 mEq/L — ABNORMAL HIGH (ref 137–147)
Sodium: 158 mEq/L — ABNORMAL HIGH (ref 137–147)
Sodium: 157 mEq/L — ABNORMAL HIGH (ref 137–147)
Sodium: 158 mEq/L — ABNORMAL HIGH (ref 137–147)
Sodium: 142 mEq/L (ref 137–147)

## 2014-07-12 LAB — GLUCOSE, CAPILLARY
GLUCOSE-CAPILLARY: 286 mg/dL — AB (ref 70–99)
Glucose-Capillary: 291 mg/dL — ABNORMAL HIGH (ref 70–99)

## 2014-07-12 LAB — URINALYSIS, ROUTINE W REFLEX MICROSCOPIC
Bilirubin Urine: NEGATIVE
GLUCOSE, UA: 500 mg/dL — AB
Ketones, ur: >80 mg/dL — AB
Leukocytes, UA: NEGATIVE
Nitrite: NEGATIVE
Protein, ur: NEGATIVE mg/dL
SPECIFIC GRAVITY, URINE: 1.012 (ref 1.005–1.030)
UROBILINOGEN UA: 0.2 mg/dL (ref 0.0–1.0)
pH: 6.5 (ref 5.0–8.0)

## 2014-07-12 LAB — BLOOD GAS, ARTERIAL
ACID-BASE DEFICIT: 23.1 mmol/L — AB (ref 0.0–2.0)
Bicarbonate: 5.1 mEq/L — ABNORMAL LOW (ref 20.0–24.0)
DRAWN BY: 305991
FIO2: 1 %
O2 Saturation: 89.6 %
PCO2 ART: 17.4 mmHg — AB (ref 35.0–45.0)
PH ART: 7.091 — AB (ref 7.350–7.450)
PO2 ART: 71.8 mmHg — AB (ref 80.0–100.0)
Patient temperature: 97.4
TCO2: 5.7 mmol/L (ref 0–100)

## 2014-07-12 LAB — CBC WITH DIFFERENTIAL/PLATELET
BASOS ABS: 0 10*3/uL (ref 0.0–0.1)
Basophils Relative: 0 % (ref 0–1)
EOS ABS: 0 10*3/uL (ref 0.0–0.7)
Eosinophils Relative: 0 % (ref 0–5)
HCT: 29.8 % — ABNORMAL LOW (ref 36.0–46.0)
Hemoglobin: 9.6 g/dL — ABNORMAL LOW (ref 12.0–15.0)
LYMPHS PCT: 3 % — AB (ref 12–46)
Lymphs Abs: 0.8 10*3/uL (ref 0.7–4.0)
MCH: 30.1 pg (ref 26.0–34.0)
MCHC: 32.2 g/dL (ref 30.0–36.0)
MCV: 93.4 fL (ref 78.0–100.0)
MONOS PCT: 6 % (ref 3–12)
Monocytes Absolute: 1.6 10*3/uL — ABNORMAL HIGH (ref 0.1–1.0)
NEUTROS ABS: 24.5 10*3/uL — AB (ref 1.7–7.7)
NEUTROS PCT: 91 % — AB (ref 43–77)
PLATELETS: 343 10*3/uL (ref 150–400)
RBC: 3.19 MIL/uL — ABNORMAL LOW (ref 3.87–5.11)
RDW: 19.2 % — AB (ref 11.5–15.5)
WBC: 26.9 10*3/uL — AB (ref 4.0–10.5)

## 2014-07-12 LAB — POCT I-STAT 3, ART BLOOD GAS (G3+)
Acid-Base Excess: 1 mmol/L (ref 0.0–2.0)
Acid-base deficit: 9 mmol/L — ABNORMAL HIGH (ref 0.0–2.0)
Bicarbonate: 15.5 mEq/L — ABNORMAL LOW (ref 20.0–24.0)
Bicarbonate: 23.9 mEq/L (ref 20.0–24.0)
O2 SAT: 89 %
O2 SAT: 93 %
PCO2 ART: 28.8 mmHg — AB (ref 35.0–45.0)
PH ART: 7.338 — AB (ref 7.350–7.450)
PH ART: 7.484 — AB (ref 7.350–7.450)
PO2 ART: 60 mmHg — AB (ref 80.0–100.0)
Patient temperature: 98.6
TCO2: 16 mmol/L (ref 0–100)
TCO2: 25 mmol/L (ref 0–100)
pCO2 arterial: 32.1 mmHg — ABNORMAL LOW (ref 35.0–45.0)
pO2, Arterial: 64 mmHg — ABNORMAL LOW (ref 80.0–100.0)

## 2014-07-12 LAB — CBC
HEMATOCRIT: 29.5 % — AB (ref 36.0–46.0)
Hemoglobin: 9.5 g/dL — ABNORMAL LOW (ref 12.0–15.0)
MCH: 29 pg (ref 26.0–34.0)
MCHC: 32.2 g/dL (ref 30.0–36.0)
MCV: 89.9 fL (ref 78.0–100.0)
Platelets: 278 10*3/uL (ref 150–400)
RBC: 3.28 MIL/uL — ABNORMAL LOW (ref 3.87–5.11)
RDW: 18.7 % — ABNORMAL HIGH (ref 11.5–15.5)
WBC: 25.4 10*3/uL — AB (ref 4.0–10.5)

## 2014-07-12 LAB — MAGNESIUM: MAGNESIUM: 2.6 mg/dL — AB (ref 1.5–2.5)

## 2014-07-12 LAB — URINE MICROSCOPIC-ADD ON

## 2014-07-12 LAB — ALBUMIN: ALBUMIN: 2.2 g/dL — AB (ref 3.5–5.2)

## 2014-07-12 LAB — LACTIC ACID, PLASMA
LACTIC ACID, VENOUS: 2.2 mmol/L (ref 0.5–2.2)
LACTIC ACID, VENOUS: 2.7 mmol/L — AB (ref 0.5–2.2)
LACTIC ACID, VENOUS: 2.6 mmol/L — AB (ref 0.5–2.2)

## 2014-07-12 LAB — TROPONIN I
TROPONIN I: 0.37 ng/mL — AB (ref <0.30)
Troponin I: <0.3 ng/mL (ref <0.30)

## 2014-07-12 LAB — KETONES, QUALITATIVE

## 2014-07-12 MED ORDER — INSULIN ASPART 100 UNIT/ML ~~LOC~~ SOLN: 0.0000 [IU] | SUBCUTANEOUS | Status: DC

## 2014-07-12 MED ORDER — DEXTROSE 5 % IV SOLN
2.0000 ug/min | INTRAVENOUS | Status: DC
  Administered 2014-07-12 (×2): 20 ug/min via INTRAVENOUS
  Filled 2014-07-12 (×4): qty 4

## 2014-07-12 MED ORDER — ETOMIDATE 2 MG/ML IV SOLN
0.3000 mg/kg | Freq: Once | INTRAVENOUS | Status: AC
  Administered 2014-07-12: 20 mg via INTRAVENOUS
  Filled 2014-07-12: qty 10.91

## 2014-07-12 MED ORDER — FENTANYL CITRATE 0.05 MG/ML IJ SOLN
100.0000 ug | INTRAMUSCULAR | Status: DC | PRN
  Administered 2014-07-12 – 2014-07-15 (×2): 100 ug via INTRAVENOUS
  Filled 2014-07-12 (×6): qty 2

## 2014-07-12 MED ORDER — CHLORHEXIDINE GLUCONATE 0.12 % MT SOLN
15.0000 mL | Freq: Two times a day (BID) | OROMUCOSAL | Status: DC
  Administered 2014-07-12 – 2014-07-17 (×10): 15 mL via OROMUCOSAL
  Filled 2014-07-12 (×9): qty 15

## 2014-07-12 MED ORDER — DOPAMINE-DEXTROSE 3.2-5 MG/ML-% IV SOLN
INTRAVENOUS | Status: AC
Start: 2014-07-12 — End: 2014-07-13
  Filled 2014-07-12: qty 250

## 2014-07-12 MED ORDER — DOPAMINE-DEXTROSE 3.2-5 MG/ML-% IV SOLN: 2.0000 ug/kg/min | INTRAVENOUS | Status: DC

## 2014-07-12 MED ORDER — SODIUM BICARBONATE 8.4 % IV SOLN
100.0000 meq | Freq: Once | INTRAVENOUS | Status: DC
  Filled 2014-07-12: qty 100

## 2014-07-12 MED ORDER — CETYLPYRIDINIUM CHLORIDE 0.05 % MT LIQD
7.0000 mL | Freq: Four times a day (QID) | OROMUCOSAL | Status: DC
  Administered 2014-07-12 – 2014-07-22 (×41): 7 mL via OROMUCOSAL

## 2014-07-12 MED ORDER — POTASSIUM CHLORIDE 10 MEQ/100ML IV SOLN
10.0000 meq | INTRAVENOUS | Status: DC
  Filled 2014-07-12: qty 100

## 2014-07-12 MED ORDER — ADULT MULTIVITAMIN W/MINERALS CH
1.0000 | ORAL_TABLET | Freq: Every day | ORAL | Status: DC
  Administered 2014-07-13 – 2014-07-18 (×6): 1
  Filled 2014-07-12 (×7): qty 1

## 2014-07-12 MED ORDER — POLYETHYLENE GLYCOL 3350 17 G PO PACK: 17.0000 g | PACK | Freq: Two times a day (BID) | ORAL | Status: DC

## 2014-07-12 MED ORDER — FENTANYL CITRATE 0.05 MG/ML IJ SOLN
100.0000 ug | INTRAMUSCULAR | Status: DC | PRN
Start: 2014-07-12 — End: 2014-07-19
  Administered 2014-07-12 – 2014-07-17 (×13): 100 ug via INTRAVENOUS
  Filled 2014-07-12 (×9): qty 2

## 2014-07-12 MED ORDER — NOREPINEPHRINE BITARTRATE 1 MG/ML IV SOLN
2.0000 ug/min | INTRAVENOUS | Status: DC
  Filled 2014-07-12: qty 16

## 2014-07-12 MED ORDER — SODIUM CHLORIDE 0.9 % IJ SOLN
10.0000 mL | Freq: Two times a day (BID) | INTRAMUSCULAR | Status: DC
  Administered 2014-07-14: 10 mL
  Administered 2014-07-14: 20 mL
  Administered 2014-07-15 (×2): 10 mL
  Administered 2014-07-16: 3 mL
  Administered 2014-07-16 – 2014-07-19 (×7): 10 mL
  Administered 2014-07-20: 20 mL
  Administered 2014-07-20 – 2014-07-22 (×5): 10 mL

## 2014-07-12 MED ORDER — SODIUM BICARBONATE 8.4 % IV SOLN
150.0000 meq | Freq: Once | INTRAVENOUS | Status: AC
  Administered 2014-07-12: 150 meq via INTRAVENOUS
  Filled 2014-07-12: qty 150

## 2014-07-12 MED ORDER — FUROSEMIDE 10 MG/ML IJ SOLN
60.0000 mg | Freq: Once | INTRAMUSCULAR | Status: AC
  Administered 2014-07-12: 60 mg via INTRAVENOUS
  Filled 2014-07-12: qty 6

## 2014-07-12 MED ORDER — INSULIN REGULAR HUMAN 100 UNIT/ML IJ SOLN
INTRAMUSCULAR | Status: DC
  Administered 2014-07-12: 2.3 [IU]/h via INTRAVENOUS
  Filled 2014-07-12 (×2): qty 1

## 2014-07-12 MED ORDER — POTASSIUM CHLORIDE 10 MEQ/100ML IV SOLN
10.0000 meq | INTRAVENOUS | Status: AC
  Administered 2014-07-12 (×2): 10 meq via INTRAVENOUS

## 2014-07-12 MED ORDER — SODIUM CHLORIDE 0.9 % IJ SOLN: 10.0000 mL | INTRAMUSCULAR | Status: DC | PRN

## 2014-07-12 MED ORDER — DOCUSATE SODIUM 50 MG/5ML PO LIQD
100.0000 mg | Freq: Two times a day (BID) | ORAL | Status: DC | PRN
Start: 2014-07-12 — End: 2014-07-12
  Filled 2014-07-12: qty 10

## 2014-07-12 MED ORDER — SODIUM BICARBONATE 8.4 % IV SOLN
INTRAVENOUS | Status: DC
  Administered 2014-07-12 (×3): via INTRAVENOUS
  Filled 2014-07-12 (×4): qty 850

## 2014-07-12 MED ORDER — POTASSIUM CHLORIDE 10 MEQ/50ML IV SOLN
10.0000 meq | INTRAVENOUS | Status: AC
  Administered 2014-07-12 (×4): 10 meq via INTRAVENOUS
  Filled 2014-07-12 (×3): qty 50

## 2014-07-12 MED ORDER — METOPROLOL TARTRATE 1 MG/ML IV SOLN
5.0000 mg | Freq: Once | INTRAVENOUS | Status: AC
  Administered 2014-07-12: 5 mg via INTRAVENOUS

## 2014-07-12 MED ORDER — DOCUSATE SODIUM 50 MG/5ML PO LIQD
100.0000 mg | Freq: Two times a day (BID) | ORAL | Status: DC | PRN
  Filled 2014-07-12: qty 10

## 2014-07-12 MED ORDER — POTASSIUM CHLORIDE 10 MEQ/100ML IV SOLN
10.0000 meq | INTRAVENOUS | Status: DC
  Filled 2014-07-12 (×3): qty 100

## 2014-07-12 MED ORDER — DEXTROSE 10 % IV SOLN
INTRAVENOUS | Status: DC
  Administered 2014-07-12: 17:00:00 via INTRAVENOUS

## 2014-07-12 MED ORDER — PANTOPRAZOLE SODIUM 40 MG IV SOLR
40.0000 mg | Freq: Every day | INTRAVENOUS | Status: DC
  Administered 2014-07-13 – 2014-07-14 (×2): 40 mg via INTRAVENOUS
  Filled 2014-07-12 (×2): qty 40

## 2014-07-12 MED ORDER — MIDAZOLAM HCL 2 MG/2ML IJ SOLN
INTRAMUSCULAR | Status: AC
  Filled 2014-07-12: qty 2

## 2014-07-12 MED ORDER — SERTRALINE HCL 100 MG PO TABS: 100.0000 mg | ORAL_TABLET | Freq: Every day | ORAL | Status: DC

## 2014-07-12 MED ORDER — SENNA 8.6 MG PO TABS
2.0000 | ORAL_TABLET | Freq: Every day | ORAL | Status: DC
  Filled 2014-07-12: qty 2

## 2014-07-12 MED ORDER — FENTANYL CITRATE 0.05 MG/ML IJ SOLN
INTRAMUSCULAR | Status: AC
  Filled 2014-07-12: qty 2

## 2014-07-12 MED ORDER — ALBUMIN HUMAN 25 % IV SOLN
50.0000 g | Freq: Once | INTRAVENOUS | Status: DC
  Filled 2014-07-12: qty 200

## 2014-07-12 NOTE — Progress Notes (Signed)
RT made attempts to place Aline in patient.  Was unsuccessful.  2 more attempts made and still unsuccessful.  RN aware.

## 2014-07-12 NOTE — Significant Event (Signed)
Rapid Response Event Note  Overview: Time Called: 8016 Arrival Time: 1214 Event Type: Hypotension  Initial Focused Assessment:  called by primary Rn for patient with low SBP 60's, RR 30's, HR 111 sats 90% on NRB.  Upon my arrival to patients room, Rn at bedside.  Patient is cold to touch, unresponsive and hypotensive with SBP 60's, HR 111 and sats 90% on NRB.  Sats started to drop quickly to the low 80's.   Interventions: NS 1000 cc bolus given, Dr Sherral Hammers paged and updated.  Patient received 3 amps of bicarb.  Now at bedside.  I spoke with Velna Hatchet, NP now present at bedside.  Dopamine started per Dr. Sherral Hammers.  Levophed gtt started at 20 mcg per Brandi.  Respiratory therapy called for intubation.  20 mg etomidate given at 1240 for intubation, another 20 mg etomidate given at 1250 as per Dr. Earnest Conroy.  Request for ICU bed placed.   Event Summary:  Family updated by Dr. Sherral Hammers, patient transported to 2 M 05 on bed with monitor and ventilator.   at      at          [facility], Harlin Rain

## 2014-07-12 NOTE — Progress Notes (Signed)
Received pt on non rebreather, ABGs drawn, critical values reported to Dr. Sherral Hammers. Awaiting new orders.  Shateria Paternostro M. Dalbert Batman, RN, BSN 07/12/2014 8:59 AM

## 2014-07-12 NOTE — Progress Notes (Signed)
SLP Cancellation Note  Patient Details Name: [name] MRN: [id number] DOB: [birth year]   Cancelled treatment:       Reason Eval/Treat Not Completed: Patient at procedure or test/unavailable. Pt having PICC placed. Also some respiratory distress today. Will defer therapy f/u for tomorrow.    Ileana Chalupa, Katherene Ponto 07/12/2014, 11:27 AM

## 2014-07-12 NOTE — Progress Notes (Signed)
Patient ID: [name], female   DOB: 1956-05-22, 58 y.o.   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 albuterol  2.5 mg Nebulization TID  . antiseptic oral rinse  7 mL Mouth Rinse QID  . aspirin EC  81 mg Oral Daily  . atorvastatin  10 mg Oral QHS  . chlorhexidine  15 mL Mouth Rinse BID  . DOPamine      . heparin  5,000 Units Subcutaneous 3 times per day  . insulin aspart  0-15 Units Subcutaneous 6 times per day  . [START ON 07/13/2014] multivitamin with minerals  1 tablet Per Tube Daily  . ondansetron (ZOFRAN) IV  4 mg Intravenous TID AC  . [START ON 07/13/2014] pantoprazole (PROTONIX) IV  40 mg Intravenous Daily  . piperacillin-tazobactam (ZOSYN)  IV  3.375 g Intravenous Q8H  . senna  2 tablet Per Tube QHS  . [START ON 07/13/2014] sertraline  100 mg Per Tube Daily  . sodium chloride  10-40 mL Intracatheter Q12H  . sodium chloride  3 mL Intravenous Q12H  . traZODone  100 mg Oral Once  . vancomycin  750 mg Intravenous Q24H    Objective: Temp:  [97 F (36.1 C)-98.8 F (37.1 C)] 97.3 F (36.3 C) (08/06 1206) Pulse Rate:  [75-122] 95 (08/06 1328) Resp:  [21-41] 39 (08/06 1328) BP: (105-133)/(53-82) 105/82 mmHg (08/06 1328) SpO2:  [87 %-96 %] 92 % (08/06 1328) FiO2 (%):  [80 %-100 %] 80 % (08/06 1328) Weight:  [160 lb 4.4 oz (72.7 kg)] 160 lb 4.4 oz (72.7 kg) (08/06 0500)  General: Now intubated and unresponsive Lungs: Rhonchi Cor: Tachycardic with regular S1-S2 no murmur heard Abdomen: Soft  Lab Results Lab Results  Component Value Date   WBC 26.9* 07/12/2014   HGB 9.6* 07/12/2014   HCT 29.8* 07/12/2014   MCV 93.4 07/12/2014   PLT 343 07/12/2014    Lab Results  Component Value Date   CREATININE 2.02* 07/12/2014   BUN 36* 07/12/2014   NA 155* 07/12/2014   K 3.2* 07/12/2014   CL 115* 07/12/2014   CO2 <7* 07/12/2014    Lab Results  Component Value Date   ALT 17 06/18/2014   AST 25 06/14/2014   ALKPHOS 157* 06/12/2014   BILITOT <0.2* 07/01/2014      HIV 1 RNA Quant (copies/mL)  Date Value  06/16/2014 <20   11/21/2013 <20   08/01/2013 <20      CD4 T Cell Abs (/uL)  Date Value  06/18/2014 1200   11/21/2013 1140   08/01/2013 960    Hepatitis C viral load: Undetectable  Microbiology: Recent Results (from the past 240 hour(s))  CULTURE, BLOOD (ROUTINE X 2)     Status: None   Collection Time    06/14/2014  2:40 PM      Result Value Ref Range Status   Specimen Description BLOOD LEFT HAND   Final   Special Requests BOTTLES DRAWN AEROBIC AND ANAEROBIC 2CC   Final  Culture  Setup Time     Final   Value: 06/28/2014 21:54     Performed at [facility]   Culture     Final   Value: NO GROWTH 5 DAYS     Performed at [facility]   Report Status 07/10/2014 FINAL   Final  MRSA PCR SCREENING     Status: Abnormal   Collection Time    07/06/2014  4:46 PM      Result Value Ref Range Status   MRSA by PCR POSITIVE (*) NEGATIVE Final   Comment:            The GeneXpert MRSA Assay (FDA     approved for NASAL specimens     only), is one component of a     comprehensive MRSA colonization     surveillance program. It is not     intended to diagnose MRSA     infection nor to guide or     monitor treatment for     MRSA infections.     RESULT CALLED TO, READ BACK BY AND VERIFIED WITH:     Durenda Age RN 19:05 06/16/2014 ([email or web address])  CULTURE, BLOOD (ROUTINE X 2)     Status: None   Collection Time    06/06/2014  6:12 PM      Result Value Ref Range Status   Specimen Description BLOOD HAND RIGHT   Final   Special Requests BOTTLES DRAWN AEROBIC AND ANAEROBIC 2CC   Final   Culture  Setup Time     Final     Value: 07/05/2014 00:36     Performed at [facility]   Culture     Final   Value: NO GROWTH 5 DAYS     Performed at [facility]   Report Status 07/11/2014 FINAL   Final  URINE CULTURE     Status: None   Collection Time    06/24/2014 10:40 PM      Result Value Ref Range Status   Specimen Description URINE, RANDOM   Final   Special Requests NONE   Final   Culture  Setup Time     Final   Value: 07/01/2014 22:57     Performed at [place]     Final   Value: NO GROWTH     Performed at [facility]   Culture     Final   Value: NO GROWTH     Performed at [facility]   Report Status 07/05/2014 FINAL   Final  CULTURE, BLOOD (ROUTINE X 2)     Status: None   Collection Time    07/10/14  9:20 AM      Result Value Ref Range Status   Specimen Description BLOOD RIGHT ARM   Final   Special Requests BOTTLES DRAWN AEROBIC ONLY 5CC   Final   Culture  Setup Time     Final   Value: 07/10/2014 15:54     Performed at [facility]   Culture     Final   Value:        BLOOD CULTURE RECEIVED NO GROWTH TO DATE CULTURE WILL BE HELD FOR 5 DAYS BEFORE ISSUING A FINAL NEGATIVE REPORT     Performed at [facility]   Report Status PENDING   Incomplete  CULTURE, BLOOD (ROUTINE X 2)     Status: None   Collection Time    07/10/14  9:31  AM      Result Value Ref Range Status   Specimen Description BLOOD LEFT HAND   Final   Special Requests BOTTLES DRAWN AEROBIC AND ANAEROBIC 10CC   Final   Culture  Setup Time     Final   Value: 07/10/2014 15:54     Performed at [facility]   Culture     Final   Value:        BLOOD CULTURE RECEIVED NO GROWTH TO DATE CULTURE WILL BE HELD FOR 5 DAYS BEFORE ISSUING A FINAL NEGATIVE REPORT     Performed at [facility]   Report Status PENDING   Incomplete  MRSA PCR SCREENING     Status: None   Collection Time    07/10/14  4:45 PM      Result Value Ref Range Status   MRSA by PCR  NEGATIVE  NEGATIVE Final   Comment:            The GeneXpert MRSA Assay (FDA     approved for NASAL specimens     only), is one component of a     comprehensive MRSA colonization     surveillance program. It is not     intended to diagnose MRSA     infection nor to guide or     monitor treatment for     MRSA infections.    Studies/Results: Dg Chest Port 1 View  07/12/2014   CLINICAL DATA:  Shortness of breath.  EXAM: PORTABLE CHEST - 1 VIEW  COMPARISON:  Chest x-ray from yesterday.  FINDINGS: Lower lung volumes which accounts for diffuse increase in vascular prominence. There may also be background pulmonary edema. Bibasilar lung opacity persists, with air bronchograms. No increasing pleural fluid. No pneumothorax.  Likely no cardiomegaly when considering technical factors.  Chronic pancreatitis with diffuse parenchymal calcifications. This is known per the electronic medical record.  IMPRESSION: 1. Lower lung volumes. 2. Persistent bibasilar opacities which could reflect atelectasis or pneumonia. 3. Probable superimposed pulmonary edema.   Electronically Signed   By: Jorje Guild M.D.   On: 07/12/2014 06:52   Dg Chest Port 1 View  07/11/2014   CLINICAL DATA:  Shortness of breath  EXAM: PORTABLE CHEST - 1 VIEW  COMPARISON:  07/10/2014  FINDINGS: Bibasilar opacities with air bronchograms best seen in the retrocardiac lung. Questionable improvement in right basilar aeration. Pulmonary venous congestion. Borderline cardiomegaly. Negative upper mediastinal contours. No evidence of increasing pleural fluid. No pneumothorax.  IMPRESSION: Persistent basilar lung opacities which could reflect pneumonia or atelectasis.   Electronically Signed   By: Jorje Guild M.D.   On: 07/11/2014 05:16    Assessment: She had sudden deterioration this morning and now has ventilator dependent respiratory failure. She is on therapy for probable HCAP. Her HIV infection has been under excellent control but she is off  of antiretroviral therapy now because of acute renal insufficiency and inability to take oral medications. This is not an HIV related opportunistic infection.  Plan: 1. Continue current antibiotics 2. Await final blood culture results 3. Suctioned for sputum Gram stain and culture 4. Observe off of antiretroviral therapy  Michel Bickers, MD [facility] [place] [phone] pager   [phone] cell 07/12/2014, 1:42 PM

## 2014-07-12 NOTE — Progress Notes (Signed)
RT entered room for neb tx. Pt RR in the 26's, notified  RN and asked her to contact MD.

## 2014-07-12 NOTE — Progress Notes (Signed)
Peripherally Inserted Central Catheter/Midline Placement  The IV Nurse has discussed with the patient and/or persons authorized to consent for the patient, the purpose of this procedure and the potential benefits and risks involved with this procedure.  The benefits include less needle sticks, lab draws from the catheter and patient may be discharged home with the catheter.  Risks include, but not limited to, infection, bleeding, blood clot (thrombus formation), and puncture of an artery; nerve damage and irregular heat beat.  Alternatives to this procedure were also discussed.  PICC/Midline Placement Documentation        [name] 07/12/2014, 11:14 AM I spoke with sister [facility] via phone for consent.

## 2014-07-12 NOTE — Clinical Social Work Note (Signed)
CSW went to update patient/family of SNF bed offers but found that she had been moved to unit 51M- requiring intubation per RN. CSW will update CSW covering 51M unit for follow up support and discharge planning as appropriate.  Eduard Clos, MSW, [place]

## 2014-07-12 NOTE — Progress Notes (Addendum)
CRITICAL VALUE ALERT  Critical value received:  Potassium 2.9  Date of notification:  07/12/2014  Time of notification:  0520  Critical value read back:Yes.    Nurse who received alert:  Tasia Catchings  MD notified (1st page):  Baltazar Najjar, NP  Time of first page:  [phone]  MD notified (2nd page):  Time of second page:  Responding MD: Baltazar Najjar, NP     Time MD [id number]

## 2014-07-12 NOTE — Progress Notes (Signed)
  Echocardiogram 2D Echocardiogram has been performed.  Mauricio Po 07/12/2014, 9:56 AM

## 2014-07-12 NOTE — Progress Notes (Addendum)
CRITICAL VALUE ALERT  Critical value received:  CO2 8  Date of notification:  07/12/2014  Time of notification:  0520  Critical value read back:Yes.    Nurse who received alert:  Tasia Catchings  MD notified (1st page):  Baltazar Najjar, NP  Time of first page:  [phone]  MD notified (2nd page):  Time of second page:  Responding MD:  Baltazar Najjar, NP  Time MD responded:  [phone]

## 2014-07-12 NOTE — Progress Notes (Signed)
[place] TEAM 1 - Stepdown/ICU TEAM Progress Note  [name] ZOX:[id number] DOB: [birth year] DOA: 06/19/2014 PCP: Pcp Not In System  Admit HPI / Brief Narrative: Patient is a 58 yo BF PMHx  PAD-s/p Left BKA, HIV, HTN, DM admitted on 7/29 for right foot pain. She was scheduled on day of admission to undergo an arteriogram however was found to be in ARF, and admitted to the hospitalist service. Her hospital course has been complicated by development of Nausea/vomting and hypokalemia/met acidosis. She subsequently underwent Right BKA on 8/3, however on 8/4-she was found to be lethargic, with leukocytosis. A CXR showed CHF and PNA.Current suspicion is for aspiration PNA given clinical setting. She was transferred to SDU for closer monitoring on 07/10/14.    HPI/Subjective: 8./6 patient lethargic will open eyes with heavy sternal rub, tachypnea, tachycardic.  Assessment/Plan: Acute kidney injury  -Likely secondary to dehydration,lisinopril  -creatinine down trending.  -Continue to hold Lisinopril and other nephrotoxic medication   HCAP/Aspiration PNA  -Post operatively was very drowsy-recent vomiting a few days back as well-suspected Aspiration PNA.  -Leukocytosis is stable  -8/4 confirms RLL infiltrate. Blood culture 8/4 negative so far, started empirically on IV Vanco/Zosyn-day 2. Await swallow eval.  Diastolic CHF -Obtain stat echocardiogram  Pulmonary edema -CXR shows acute pulmonary edema overlaid on patient'sHCAP -Lasix IV 60 mg x1   Acute Encephalopathy  -multifactorial-secondary to PNA, Narcotics, and respiratory/metabolic acidosis  -Extremely lethargic, will open eyes to heavy sternal rub, does not follow commands  Combined Respiratory/Metabolic Acidosis  -Most likely multifactorial; CHF, Renal failure, sepsis secondary to ischemic leg, PNA? -Start patient on bicarbonate drip in sterile water 162m/hr -BMP now been every 2 hour -Spoke with RN AMarathon Oil(sister) who gave  permission for insertion of CBC/PICC line -Obtain lactic acid   Hypokalemia  -Potassium 10 mEq  x4   -BMP every 2 hour -Check magnesium level  Hyperglycemia -Start moderate SSI -N.p.o. 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 Will monitor closely post operatively, VVS following  -Surgical site clean negative sign of infection    Hypotension  -Resolved  -Patient currently on bicarbonate drip  -Patient received bicarbonate 150 mEq x1 -Albumin 50 gm x1   HTN  -Resolved  -Secondary to patient's lethargy, sepsis will hold all BP medication, except for PRN meds for excessive HTN  -PRN Metoprolol IV and PRN hydralazine IV -ADDENDUM; 8/6 at approximately 12:15 paced to bedside secondary to patient becoming severely hypotensive BP 60/40. Upon arrival at bedside rapid response team already present (RN DBeulah Gandy. Patient was receiving bicarbonate x3 and (150 mEq), bicarbonate drip was continued at 1541mhr. Dopamine drip was initially started, then DC'd and Levophed was started. Patient was successfully intubated by NP BrNoe Gensnd patient was transported to 92M.   Diabetes Mellitus Type 2  -Patient is n.p.o. secondary to her mental status changes. Control CBG's with moderate SSI for now. -Check urine ketones   Insomnia  -Hold all medications which could cause for the sedation    Depression  -Continue zoloft 100 mg daily when patient's mental status improved  HIV & Hepatitis C  -ID consulted.  -Per Dr. CaMegan Salon"Continue to hold antiretroviral therapy until her renal function stabilizes; Hold Truvada and Tivicay - CD4 count and HIV viral load, Check hepatitis C viral load" pending  Hx of Chronic Pancreatitis  -resume Creon when oral intake resumed    Code Status:  FULL Family Communication: Attempted to contact sister Watt Climes Scales [phone] left  messages at both  numbers  Disposition Plan:     Consultants: Dr. Sherren Mocha early (vascular surgery) Dr. Michel Bickers (infectious disease)   Procedure/Significant Events: 8/3 right BKA 8/6 intubated    Culture 7/29 blood right hand negative 7/29 MRSA by PCR positive 7/29 urine negative 8/4 blood right arm/left hand NGTD   Antibiotics: Zosyn 8/4>> Vancomycin 8/4>>   DVT prophylaxis: Subcutaneous heparin   Devices NA   LINES / TUBES:  8./1 22ga right forearm 8/3 right hand 8/6 midline double lumen PICC     Continuous Infusions: .  sodium bicarbonate 150 mEq in sterile water 1000 mL infusion      Objective: VITAL SIGNS: Temp: 97 F (36.1 C) (08/06 0815) Temp src: Axillary (08/06 0815) BP: 120/76 mmHg (08/06 0815) Pulse Rate: 122 (08/06 0815) SPO2; FIO2:   Intake/Output Summary (Last 24 hours) at 07/12/14 0845 Last data filed at 07/12/14 0820  Gross per 24 hour  Intake    776 ml  Output   2450 ml  Net  -1674 ml     Exam: General: Lethargic, will open eyes to heavy sternal rub, will not respond to questions. Moderate  acute respiratory distress Lungs: Diffuse rhonchi/crackles tachypnea  Cardiovascular: Tachycardic, Regular rhythm without murmur gallop or rub normal S1 and S2 Abdomen: Nontender, nondistended, soft, bowel sounds positive, no rebound, no ascites, no appreciable mass Extremities: No significant cyanosis, clubbing. Left AKA, right BKA (new) staples present negative discharge, negative erythema negative sign of infection  Data Reviewed: Basic Metabolic Panel:  Recent Labs Lab 07/08/14 2344 07/10/14 0520 07/10/14 2213 07/11/14 0330 07/12/14 0407  NA 141 146 149* 148* 152*  K 3.3* 3.3* 2.8* 3.3* 2.9*  CL 105 112 113* 112 116*  CO2 18* 12* 14* 14* 8*  GLUCOSE 117* 177* 94 154* 234*  BUN 23 23 24* 24* 32*  CREATININE 1.32* 1.34* 1.42* 1.41* 1.79*  CALCIUM 8.5 8.5 8.7 8.7 8.6  MG  --   --   --  2.0  --    Liver Function Tests: No results found  for this basename: AST, ALT, ALKPHOS, BILITOT, PROT, ALBUMIN,  in the last 168 hours No results found for this basename: LIPASE, AMYLASE,  in the last 168 hours No results found for this basename: AMMONIA,  in the last 168 hours CBC:  Recent Labs Lab 07/07/14 1145 07/08/14 2344 07/10/14 0520 07/11/14 0330 07/12/14 0407  WBC 14.2* 19.2* 28.0* 25.6* 25.4*  HGB 11.3* 11.4* 10.3* 9.7* 9.5*  HCT 33.7* 34.7* 31.8* 30.2* 29.5*  MCV 91.6 91.6 93.5 90.4 89.9  PLT 213 245 250 253 278   Cardiac Enzymes: No results found for this basename: CKTOTAL, CKMB, CKMBINDEX, TROPONINI,  in the last 168 hours BNP (last 3 results)  Recent Labs  07/10/14 0520  PROBNP 3151.0*   CBG:  Recent Labs Lab 07/11/14 1220 07/11/14 1651 07/11/14 2014 07/11/14 2336 07/12/14 0811  GLUCAP 187* 183* 178* 203* 286*    Recent Results (from the past 240 hour(s))  CULTURE, BLOOD (ROUTINE X 2)     Status: None   Collection Time    07/01/2014  2:40 PM      Result Value Ref Range Status   Specimen Description BLOOD LEFT HAND   Final   Special Requests BOTTLES DRAWN AEROBIC AND ANAEROBIC 2CC   Final   Culture  Setup Time     Final   Value: 07/03/2014 21:54  Performed at [facility]     Final   Value: NO GROWTH 5 DAYS     Performed at [facility]   Report Status 07/10/2014 FINAL   Final  MRSA PCR SCREENING     Status: Abnormal   Collection Time    07/03/2014  4:46 PM      Result Value Ref Range Status   MRSA by PCR POSITIVE (*) NEGATIVE Final   Comment:            The GeneXpert MRSA Assay (FDA     approved for NASAL specimens     only), is one component of a     comprehensive MRSA colonization     surveillance program. It is not     intended to diagnose MRSA     infection nor to guide or     monitor treatment for     MRSA infections.     RESULT CALLED TO, READ BACK BY AND VERIFIED WITH:     Durenda Age RN 19:05 06/17/2014 ([email or web address])  CULTURE, BLOOD (ROUTINE X 2)     Status:  None   Collection Time    06/07/2014  6:12 PM      Result Value Ref Range Status   Specimen Description BLOOD HAND RIGHT   Final   Special Requests BOTTLES DRAWN AEROBIC AND ANAEROBIC 2CC   Final   Culture  Setup Time     Final   Value: 07/05/2014 00:36     Performed at [facility]   Culture     Final   Value: NO GROWTH 5 DAYS     Performed at [facility]   Report Status 07/11/2014 FINAL   Final  URINE CULTURE     Status: None   Collection Time    06/21/2014 10:40 PM      Result Value Ref Range Status   Specimen Description URINE, RANDOM   Final   Special Requests NONE   Final   Culture  Setup Time     Final   Value: 07/06/2014 22:57     Performed at [place]     Final   Value: NO GROWTH     Performed at [facility]   Culture     Final   Value: NO GROWTH     Performed at [facility]   Report Status 07/05/2014 FINAL   Final  CULTURE, BLOOD (ROUTINE X 2)     Status: None   Collection Time    07/10/14  9:20 AM      Result Value Ref Range Status   Specimen Description BLOOD RIGHT ARM   Final   Special Requests BOTTLES DRAWN AEROBIC ONLY 5CC   Final   Culture  Setup Time     Final   Value: 07/10/2014 15:54     Performed at [facility]   Culture     Final   Value:        BLOOD CULTURE RECEIVED NO GROWTH TO DATE CULTURE WILL BE HELD FOR 5 DAYS BEFORE ISSUING A FINAL NEGATIVE REPORT     Performed at [facility]   Report Status PENDING   Incomplete  CULTURE, BLOOD (ROUTINE X 2)     Status: None   Collection Time    07/10/14  9:31 AM      Result Value Ref Range Status   Specimen Description BLOOD LEFT HAND  Final   Special Requests BOTTLES DRAWN AEROBIC AND ANAEROBIC 10CC   Final   Culture  Setup Time     Final   Value: 07/10/2014 15:54     Performed at [facility]   Culture     Final   Value:        BLOOD CULTURE RECEIVED NO GROWTH TO DATE CULTURE WILL BE HELD FOR 5 DAYS BEFORE ISSUING A  FINAL NEGATIVE REPORT     Performed at [facility]   Report Status PENDING   Incomplete  MRSA PCR SCREENING     Status: None   Collection Time    07/10/14  4:45 PM      Result Value Ref Range Status   MRSA by PCR NEGATIVE  NEGATIVE Final   Comment:            The GeneXpert MRSA Assay (FDA     approved for NASAL specimens     only), is one component of a     comprehensive MRSA colonization     surveillance program. It is not     intended to diagnose MRSA     infection nor to guide or     monitor treatment for     MRSA infections.     Studies:  Recent x-ray studies have been reviewed in detail by the Attending Physician  Scheduled Meds:  Scheduled Meds: . albuterol  2.5 mg Nebulization TID  . antiseptic oral rinse  7 mL Mouth Rinse BID  . aspirin EC  81 mg Oral Daily  . atorvastatin  10 mg Oral QHS  . chlorhexidine  15 mL Mouth Rinse BID  . docusate sodium  100 mg Oral Daily  . heparin  5,000 Units Subcutaneous 3 times per day  . insulin aspart  0-9 Units Subcutaneous TID WC  . lipase/protease/amylase  24,000 Units Oral TID WC  . loratadine  10 mg Oral Daily  . multivitamin with minerals  1 tablet Oral Daily  . ondansetron (ZOFRAN) IV  4 mg Intravenous TID AC  . pantoprazole (PROTONIX) IV  40 mg Intravenous Q24H  . piperacillin-tazobactam (ZOSYN)  IV  3.375 g Intravenous Q8H  . polyethylene glycol  17 g Oral BID  . potassium chloride  10 mEq Intravenous Q1 Hr x 4  . senna  2 tablet Oral QHS  . sertraline  100 mg Oral Daily  . sodium bicarbonate  1,300 mg Oral BID  . sodium chloride  3 mL Intravenous Q12H  . traZODone  100 mg Oral Once  . vancomycin  750 mg Intravenous Q24H    Time spent on care of this patient: 40 mins   Allie Bossier , MD   [facility] Office  [phone] Pager [phone]  On-Call/Text Page:      [email or web address]      password [email or web address]  If 7PM-7AM, please contact night-coverage [email or web address] Password [email or web address] 07/12/2014, 8:45  AM   LOS: 8 days

## 2014-07-12 NOTE — Progress Notes (Addendum)
  Progress Note    07/12/2014 8:34 AM 3 Days Post-Op  Subjective:  Mild distress on non rebreather  Afebrile x 24 hrs   Filed Vitals:   07/12/14 0815  BP: 120/76  Pulse: 122  Temp: 97 F (36.1 C)  Resp: 41    Physical Exam: Incisions:  C/d/i with staples in tact.  No evidence of infection   CBC    Component Value Date/Time   WBC 25.4* 07/12/2014 0407   RBC 3.28* 07/12/2014 0407   HGB 9.5* 07/12/2014 0407   HCT 29.5* 07/12/2014 0407   PLT 278 07/12/2014 0407   MCV 89.9 07/12/2014 0407   MCH 29.0 07/12/2014 0407   MCHC 32.2 07/12/2014 0407   RDW 18.7* 07/12/2014 0407   LYMPHSABS 2.2 02/21/2014 1030   MONOABS 1.1* 02/21/2014 1030   EOSABS 0.2 02/21/2014 1030   BASOSABS 0.1 02/21/2014 1030    BMET    Component Value Date/Time   NA 152* 07/12/2014 0407   K 2.9* 07/12/2014 0407   CL 116* 07/12/2014 0407   CO2 8* 07/12/2014 0407   GLUCOSE 234* 07/12/2014 0407   BUN 32* 07/12/2014 0407   CREATININE 1.79* 07/12/2014 0407   CREATININE 1.01 11/21/2013 1506   CALCIUM 8.6 07/12/2014 0407   GFRNONAA 30* 07/12/2014 0407   GFRNONAA 62 11/21/2013 1506   GFRAA 35* 07/12/2014 0407   GFRAA 71 11/21/2013 1506    INR    Component Value Date/Time   INR 1.01 01/10/2014 1534     Intake/Output Summary (Last 24 hours) at 07/12/14 0834 Last data filed at 07/12/14 0820  Gross per 24 hour  Intake    776 ml  Output   2450 ml  Net  -1674 ml     Assessment/Plan:  58 y.o. female is s/p right below knee amputation  3 Days Post-Op  -pt in respiratory distress this am -still with leukocytosis--incision is clean and dry and this is not source of white count.  -pt on vanc/zosyn for possible healthcare related PNA -right stump is viable   Leontine Locket, PA-C [facility] [phone] 07/12/2014 8:34 AM           I have examined the patient, reviewed and agree with above. Right below-knee amputation healing nicely at postop day #3. Continues to be quite ill. Lethargic this morning and  some increased respiratory effort on nonrebreather. Following with you.  EARLY, TODD, MD 07/12/2014 8:41 AM

## 2014-07-12 NOTE — Progress Notes (Signed)
Inpatient Diabetes Program Recommendations  AACE/ADA: New Consensus Statement on Inpatient Glycemic Control (2013)  Target Ranges:  Prepandial:   less than 140 mg/dL      Peak postprandial:   less than 180 mg/dL (1-2 hours)      Critically ill patients:  140 - 180 mg/dL   Results for [name], [name] (MRN [id number]) as of 07/12/2014 10:49  Ref. Range 07/11/2014 05:12 07/11/2014 08:02 07/11/2014 12:20 07/11/2014 16:51 07/11/2014 20:14 07/11/2014 23:36 07/12/2014 08:11  Glucose-Capillary Latest Range: 70-99 mg/dL 158 (H) 167 (H) 187 (H) 183 (H) 178 (H) 203 (H) 286 (H)   Diabetes history: DM Outpatient Diabetes medications: Lantus 40 units QHS, Novolog 9 units TID with meals, Novolog 1-10 units TID Current orders for Inpatient glycemic control: Novolog 0-9 units AC  Inpatient Diabetes Program Recommendations Insulin - Basal: Please consider ordering Lantus 7 units daily (based on 72 kg x 0.1 units). Insulin-Correction: If patient will remain NPO, please consider changing frequency from ACHS to Q4H.  Thanks, Barnie Alderman, RN, MSN, CCRN Diabetes Coordinator Inpatient Diabetes Program [phone] (Team Pager) [phone] ([facility] office) [phone] [facility] office)

## 2014-07-12 NOTE — Progress Notes (Signed)
[place] Progress Note Patient Name: Karrington Mccravy Vogel DOB: 07-29-1956 MRN: [id number]  Date of Service  07/12/2014   HPI/Events of Note   R sided weakness noted by RN CT head pending Hypokalemia, but most recent BMET looks grossly abnormal likely related to D10 running through line of blood draw  eICU Interventions  Stat head CT now Repeat bmet after current runs of K   Intervention Category Major Interventions: Other:;Electrolyte abnormality - evaluation and management  MCQUAID, DOUGLAS 07/12/2014, 11:13 PM

## 2014-07-12 NOTE — Progress Notes (Signed)
Spoke with sister Addison Bailey about pts status this morning. Sister is a [employer] scientist. Dr Sherral Hammers and IV nurse Margretta Sidle on the phone with daughter to obtain consent for PICC line placement on pt. Consent obtained.  Ivylynn Hoppes M. Dalbert Batman, RN, BSN 07/12/2014 10:57 AM

## 2014-07-12 NOTE — Consult Note (Signed)
PULMONARY / CRITICAL CARE MEDICINE   Name: [name] MRN: [id number] DOB: [birth year]    ADMISSION DATE:  07/05/2014 CONSULTATION DATE:  07/12/2014  REFERRING MD :  Sherral Hammers  CHIEF COMPLAINT:  Hypotension, respiratory distress  INITIAL PRESENTATION: 58 yo HIV / HCV + with DM and prior left AKA who initially presented on 7/29 with right foot pain.  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 On 8/4 developed metabolic acidosis. Acute encephalopathy, leukocytosis and CXR suggestive of PNA.  She was started on Abx for HCAP starting 8/4.  On 8/6, developed worsening acidosis acidosis (ABG 7.09 / 17 / 71), hypotension and respiratory distress required emergent intubation by PCCM and transfer to ICU.  STUDIES:  8/5 Echo >>> EF 65-70%, no RWMA.  SIGNIFICANT EVENTS: 7/29 admitted for right foot pain 8/3 underwent right BKA 8/6 required emergent intubation, transfer to ICU   HISTORY OF PRESENT ILLNESS:  Pt is encephalopathic; therefore, this HPI is obtained from chart review. [name] is a 58 y.o. With multiple co-morbidities including but not limited to HIV (CD4 count 1200 on 06/06/2014), HCV, left BKA, and resides in a nursing home.  She presented on 7/29 with complaint of right foot pain.  She was admitted to [facility] service and vascular surgery was consulted as she was well known to Dr. Donnetta Hutching (prior arteriogram in March 2014 demonstrated severe bilateral tibial disease).  Due to severe disease, she underwent right sided BKA on 8/3. On 8/4, she became lethargic, had leukocytosis and CXR with findings suggestive of PNA.  She was started on abx for HCAP and transferred to the SDU. On 8/6, her acidosis significantly worsened and she was in marked respiratory distress with worsening hypotension.  PCCM was asked to see her and upon our arrival to room, pt had parodoxical breathing with labile SpO2 (dropped to 50's at one point despite being on NRB). Due to her worsening hypotension and significant hypoxemia and  increased WOB, she required emergent intubation and transfer to the ICU. Of note, urine ketones on 8/4 and 8/6 both > 80.  Latest CBG 291.  Given these results along with profound metabolic acidosis, am highly suspicious for DKA.  PAST MEDICAL HISTORY :  Past Medical History  Diagnosis Date  . Depression   . Neuropathy   . Mood disorder   . TIA (transient ischemic attack)   . Pancreatitis chronic   . Chronic pain   . HIV (human immunodeficiency virus infection) 1990's    takes Norpramin nightly  . Polysubstance abuse   . Cardiomyopathy, nonischemic     EF 45%  . Peripheral vascular disease   . Asthma     "used to when I was young" (02/20/2013)  . Arthritis     "legs and arms" (02/20/2013)  . Hypercholesterolemia     takes Lipitor nightly  . Seasonal allergies     takes Zyrtec daily  . Anemia     takes Iron pill daily  . Nausea     takes Zofran prn  . GERD (gastroesophageal reflux disease)     takes Prilosec daily  . Insomnia     takes Trazodone nightly  . Constipation     takes miralax prn and stool softener daily  . Type II diabetes mellitus     lantus 45units at bedtime  . Dysrhythmia     takes Metoprolol daily  . Weakness of both legs   . Peripheral neuropathy   . Chronic back pain   . Hepatitis C  C  . Urinary frequency   . Nocturia    Past Surgical History  Procedure Laterality Date  . Carotid endarterectomy Right   . Tubal ligation    . Lower extremity angiogram    . Toe amputation    . Amputation Left 04/28/2013    Procedure: AMPUTATION DIGIT;  Surgeon: Rosetta Posner, MD;  Location: [place];  Service: Vascular;  Laterality: Left;  left 2nd toe amputation  . Amputation Left 05/10/2013    Procedure: AMPUTATION BELOW KNEE;  Surgeon: Rosetta Posner, MD;  Location: [place];  Service: Vascular;  Laterality: Left;  . Cataract extraction w/phaco Right 02/27/2014    Procedure: CATARACT EXTRACTION PHACO AND INTRAOCULAR LENS PLACEMENT (IOC);  Surgeon: Elta Guadeloupe T. Gershon Crane, MD;   Location: [facility];  Service: Ophthalmology;  Laterality: Right;  CDE:  6.77  . Cataract extraction w/phaco Left 03/20/2014    Procedure: CATARACT EXTRACTION PHACO AND INTRAOCULAR LENS PLACEMENT (IOC);  Surgeon: Elta Guadeloupe T. Gershon Crane, MD;  Location: [facility];  Service: Ophthalmology;  Laterality: Left;  CDE:6.98  . Amputation Right 07/16/2014    Procedure: RIGHT BELOW THE KNEE AMPUTATION;  Surgeon: Rosetta Posner, MD;  Location: [place];  Service: Vascular;  Laterality: Right;   Prior to Admission medications   Medication Sig Start Date End Date Taking? Authorizing Provider  aspirin EC 81 MG tablet Take 81 mg by mouth daily.   Yes Historical Provider, MD  atorvastatin (LIPITOR) 10 MG tablet Take 10 mg by mouth at bedtime.   Yes Historical Provider, MD  cetirizine (ZYRTEC) 10 MG tablet Take 10 mg by mouth daily.   Yes Historical Provider, MD  docusate sodium (COLACE) 100 MG capsule Take 100 mg by mouth daily.    Yes Historical Provider, MD  dolutegravir (TIVICAY) 50 MG tablet Take 1 tablet (50 mg total) by mouth daily. 01/10/14  Yes Truman Hayward, MD  emtricitabine-tenofovir (TRUVADA) 200-300 MG per tablet Take 1 tablet by mouth daily. 01/10/14  Yes Truman Hayward, MD  gabapentin (NEURONTIN) 300 MG capsule Take 1 capsule (300 mg total) by mouth 3 (three) times daily. 08/17/13  Yes Elam Dutch, MD  HYDROcodone-acetaminophen (NORCO/VICODIN) 5-325 MG per tablet Take 1 tablet by mouth every 12 (twelve) hours as needed for moderate pain.    Yes Historical Provider, MD  insulin aspart (NOVOLOG FLEXPEN) 100 UNIT/ML FlexPen Inject 1-10 Units into the skin 3 (three) times daily with meals. Per sliding scale. 100-130=1 unit; 131-160=2 units; 161-190=3 units; 191-220=4 units; 221-250=5 units; 251-280= 6 units; 281-310= 7 units; 311-340= 8 units; 341-370= 9 units; 371-400= 10 units.   Yes Historical Provider, MD  insulin aspart (NOVOLOG FLEXPEN) 100 UNIT/ML FlexPen Inject 9 Units into the skin 3 (three) times daily  with meals. In addition with sliding scale.   Yes Historical Provider, MD  insulin glargine (LANTUS) 100 UNIT/ML injection Inject 40 Units into the skin at bedtime.    Yes Janell Quiet, MD  lisinopril (PRINIVIL,ZESTRIL) 5 MG tablet Take 5 mg by mouth daily.   Yes Historical Provider, MD  LORazepam (ATIVAN) 0.5 MG tablet Take 0.5 mg by mouth 2 (two) times daily as needed for anxiety (take 1 tablet at bedtime and each morning as needed).    Yes Historical Provider, MD  metoprolol succinate (TOPROL-XL) 50 MG 24 hr tablet Take 50 mg by mouth daily after breakfast. Take with or immediately following a meal.   Yes Historical Provider, MD  Multiple Vitamin (DAILY VITE) TABS Take 1  tablet by mouth daily.   Yes Historical Provider, MD  Pancrelipase, Lip-Prot-Amyl, (CREON) 6000 UNITS CPEP Take 6,000-12,000 Units by mouth See admin instructions. Take 2 capsules three times a day with meals and take 1 capsule three times a day with snacks   Yes Historical Provider, MD  polyethylene glycol (MIRALAX / GLYCOLAX) packet Take 17 g by mouth daily as needed for mild constipation.   Yes Historical Provider, MD  sertraline (ZOLOFT) 100 MG tablet Take 100 mg by mouth daily.   Yes Historical Provider, MD  hydrOXYzine (VISTARIL) 25 MG capsule Take 25 mg by mouth every 6 (six) hours as needed for itching.    Historical Provider, MD  ondansetron (ZOFRAN) 8 MG tablet Take by mouth every 8 (eight) hours as needed for nausea or vomiting.    Historical Provider, MD   No Known Allergies  FAMILY HISTORY:  Family History  Problem Relation Age of Onset  . Coronary artery disease Father 75   SOCIAL HISTORY:  reports that she has been smoking Cigarettes.  She has a 10 pack-year smoking history. She has never used smokeless tobacco. She reports that she does not drink alcohol or use illicit drugs.  REVIEW OF SYSTEMS:  Unable to obtain as pt is encephalopathic.  SUBJECTIVE:   VITAL SIGNS: Temp:  [97 F (36.1 C)-98.8 F (37.1  C)] 97.3 F (36.3 C) (08/06 1206) Pulse Rate:  [75-122] 102 (08/06 1206) Resp:  [21-41] 35 (08/06 1206) BP: (120-133)/(53-76) 120/76 mmHg (08/06 0815) SpO2:  [87 %-96 %] 90 % (08/06 1206) FiO2 (%):  [100 %] 100 % (08/06 1206) Weight:  [72.7 kg (160 lb 4.4 oz)] 72.7 kg (160 lb 4.4 oz) (08/06 0500) HEMODYNAMICS:   VENTILATOR SETTINGS: Vent Mode:  [-]  FiO2 (%):  [100 %] 100 % INTAKE / OUTPUT: Intake/Output     08/05 0701 - 08/06 0700 08/06 0701 - 08/07 0700   P.O. 0 0   I.V. (mL/kg) 226 (3.1) 193.8 (2.7)   IV Piggyback 650    Total Intake(mL/kg) 876 (12) 193.8 (2.7)   Urine (mL/kg/hr) 2275 (1.3) 175 (0.4)   Total Output 2275 175   Net -1399 +18.8          PHYSICAL EXAMINATION: General: Chronically ill appearing female, minimally responsive, in distress. Neuro: Minimally responsive, withdraws to pain. HEENT: [place]/AT. PERRL, sclerae anicteric.  On NRB. Cardiovascular: Tachy, regular, no M/R/G.  Lungs: Respirations shallow and paradoxical, accessory muscle use, scattered rhonchi throughout. Abdomen: BS x 4, soft, NT/ND.  Musculoskeletal: Bilateral BKA's, new right sided BKA dressings C/D/I, no edema.  Skin: Intact, cool, no rashes.  LABS:  CBC  Recent Labs Lab 07/11/14 0330 07/12/14 0407 07/12/14 0842  WBC 25.6* 25.4* 26.9*  HGB 9.7* 9.5* 9.6*  HCT 30.2* 29.5* 29.8*  PLT 253 278 343   Coag's No results found for this basename: APTT, INR,  in the last 168 hours  BMET  Recent Labs Lab 07/11/14 0330 07/12/14 0407 07/12/14 1025  NA 148* 152* 155*  K 3.3* 2.9* 3.2*  CL 112 116* 115*  CO2 14* 8* <7*  BUN 24* 32* 36*  CREATININE 1.41* 1.79* 2.02*  GLUCOSE 154* 234* 351*   Electrolytes  Recent Labs Lab 07/11/14 0330 07/12/14 0407 07/12/14 1025 07/12/14 1107  CALCIUM 8.7 8.6 8.7  --   MG 2.0  --   --  2.6*   Sepsis Markers  Recent Labs Lab 07/08/14 1601 07/12/14 1107  LATICACIDVEN 1.2 2.2   ABG  Recent Labs  Lab 07/10/14 1240 07/12/14 0755   PHART 7.314* 7.091*  PCO2ART 33.5* 17.4*  PO2ART 57.1* 71.8*   Liver Enzymes No results found for this basename: AST, ALT, ALKPHOS, BILITOT, ALBUMIN,  in the last 168 hours  Cardiac Enzymes  Recent Labs Lab 07/10/14 0520  PROBNP 3151.0*   Glucose  Recent Labs Lab 07/11/14 1220 07/11/14 1651 07/11/14 2014 07/11/14 2336 07/12/14 0811 07/12/14 1203  GLUCAP 187* 183* 178* 203* 286* 291*   Imaging Dg Chest Port 1 View  07/12/2014   CLINICAL DATA:  Shortness of breath.  EXAM: PORTABLE CHEST - 1 VIEW  COMPARISON:  Chest x-ray from yesterday.  FINDINGS: Lower lung volumes which accounts for diffuse increase in vascular prominence. There may also be background pulmonary edema. Bibasilar lung opacity persists, with air bronchograms. No increasing pleural fluid. No pneumothorax.  Likely no cardiomegaly when considering technical factors.  Chronic pancreatitis with diffuse parenchymal calcifications. This is known per the electronic medical record.  IMPRESSION: 1. Lower lung volumes. 2. Persistent bibasilar opacities which could reflect atelectasis or pneumonia. 3. Probable superimposed pulmonary edema.   Electronically Signed   By: Jorje Guild M.D.   On: 07/12/2014 06:52   Dg Chest Port 1 View  07/11/2014   CLINICAL DATA:  Shortness of breath  EXAM: PORTABLE CHEST - 1 VIEW  COMPARISON:  07/10/2014  FINDINGS: Bibasilar opacities with air bronchograms best seen in the retrocardiac lung. Questionable improvement in right basilar aeration. Pulmonary venous congestion. Borderline cardiomegaly. Negative upper mediastinal contours. No evidence of increasing pleural fluid. No pneumothorax.  IMPRESSION: Persistent basilar lung opacities which could reflect pneumonia or atelectasis.   Electronically Signed   By: Jorje Guild M.D.   On: 07/11/2014 05:16   ASSESSMENT / PLAN:  PULMONARY A: Acute hypoxemic respiratory failure secondary to pneumonia and severe aacidosis, intubated 8/6 HCAP vs  aspiration pneumonia in setting of encephalopathy P:   Goal pH > 7.28, SpO2 > 92%. Full mechanical support, high Ve VAP bundle. Defer SBT for now Trend ABG / CXR Albuterol PRN  CARDIOVASCULAR A:  Hypotension in setting of severe metabolic acidosis and hypovolemia Sepsis, lactate reassuring Chronic systolic heart failure, preserved systolic function EF 84% 8/6 R BKA 8/4 P:  Goal MAP > 65. Trend troponin / lactate Levophed gtt Vascular following  RENAL A:   AG metabolic acidosis, likely secondary to DKA ( urine ketones positive 8/4 ) Lactate is low and out of proportion with degree of acidosis Toxic ingestion is unlikely in inpatient setting Are HAART medications contributing? AKI Hypernatremia Hypokalemia Hypovolemia P:   Bicarb gtt @ 150 Hold outpatient Lisinopril. BMP q2hr x 4 K 10 x 4  GASTROINTESTINAL A:   Nutrition GIPx P:   NPO. Protonix  HEMATOLOGIC A:   VTE Prophylaxis P:  SCD / Heparin [place] Trend CBC  INFECTIOUS A:   HIV HCV Possible HCAP P:   ID follwing Blood cx 8/4 >>> Sputum cx 8/6 >>> Vancomycin 8/4 >>> Zosyn 8/4 >>> Holding HAART  ENDOCRINE A:   DM 2 Likely  DKA without significant hyperglycemia At risk for adrenal insufficiency ( adrenalitis of HIV?) P:   Insulin gtt D10@75  to allow for significant Insulin rates without corresponding hypoglycemia BMP q2hrs Follow ketones Cortisol level  NEUROLOGIC A:   Acute metabolic encephalopathy P:   RASS goal 0 to -1 Fentanyl PRN  Montey Hora, PA - C [place] [facility] Pgr: [phone]  or [phone] 07/12/2014, 1:06 PM  I have personally obtained history, examined patient, evaluated and interpreted laboratory and imaging results, reviewed medical records, formulated assessment / plan and placed orders.  CRITICAL CARE:  The patient is critically ill with multiple organ systems failure and requires high complexity decision making for assessment  and support, frequent evaluation and titration of therapies, application of advanced monitoring technologies and extensive interpretation of multiple databases. Critical Care Time devoted to patient care services described in this note is 60 minutes.   Doree Fudge, MD Pulmonary and [place] Pager: [phone]  07/12/2014, 8:15 PM

## 2014-07-12 NOTE — Progress Notes (Signed)
Pt transferred to [place] ICU due to rapid intubation and unstable vital signs and lab values. Report given to ICU nurse.  Madonna Flegal M. Dalbert Batman, RN, BSN 07/12/2014 2:13 PM

## 2014-07-12 NOTE — Procedures (Signed)
Intubation Procedure Note [name] [id number] October 26, 1956  Procedure: Intubation Indications: Respiratory insufficiency  Procedure Details Consent: Unable to obtain consent because of emergent medical necessity. Time Out: Verified patient identification, verified procedure, site/side was marked, verified correct patient position, special equipment/implants available, medications/allergies/relevent history reviewed, required imaging and test results available.  Performed  Maximum sterile technique was used including antiseptics, gloves, gown, hand hygiene and mask.  MAC and 4    Evaluation Hemodynamic Status: BP stable throughout; O2 sats: transiently fell during during procedure Patient's Current Condition: critically ill, transferred to ICU Complications: No apparent complications Patient did tolerate procedure well. Chest X-ray ordered to verify placement.  CXR: pending.  Procedure performed under direct supervision of Dr. Oliver Pila.    [name], NP-C [place] [facility] Pgr: [phone] or [phone]  07/12/2014  Supervised and present through the entire procedure.  Doree Fudge, MD Pulmonary and [place] Pager: [phone]

## 2014-07-13 ENCOUNTER — Inpatient Hospital Stay (HOSPITAL_COMMUNITY): Payer: Medicaid Other

## 2014-07-13 ENCOUNTER — Encounter (HOSPITAL_COMMUNITY): Payer: Self-pay | Admitting: Radiology

## 2014-07-13 DIAGNOSIS — I6350 Cerebral infarction due to unspecified occlusion or stenosis of unspecified cerebral artery: Secondary | ICD-10-CM

## 2014-07-13 DIAGNOSIS — I6330 Cerebral infarction due to thrombosis of unspecified cerebral artery: Secondary | ICD-10-CM

## 2014-07-13 DIAGNOSIS — E874 Mixed disorder of acid-base balance: Secondary | ICD-10-CM

## 2014-07-13 DIAGNOSIS — I639 Cerebral infarction, unspecified: Secondary | ICD-10-CM | POA: Diagnosis present

## 2014-07-13 LAB — COMPREHENSIVE METABOLIC PANEL
ALBUMIN: 2.1 g/dL — AB (ref 3.5–5.2)
ALT: 18 U/L (ref 0–35)
ANION GAP: 18 — AB (ref 5–15)
AST: 40 U/L — ABNORMAL HIGH (ref 0–37)
Alkaline Phosphatase: 141 U/L — ABNORMAL HIGH (ref 39–117)
BILIRUBIN TOTAL: 0.5 mg/dL (ref 0.3–1.2)
BUN: 19 mg/dL (ref 6–23)
CALCIUM: 7.5 mg/dL — AB (ref 8.4–10.5)
CHLORIDE: 114 meq/L — AB (ref 96–112)
CO2: 24 mEq/L (ref 19–32)
CREATININE: 1.61 mg/dL — AB (ref 0.50–1.10)
GFR calc Af Amer: 39 mL/min — ABNORMAL LOW (ref >90)
GFR, EST NON AFRICAN AMERICAN: 34 mL/min — AB (ref >90)
Glucose, Bld: 172 mg/dL — ABNORMAL HIGH (ref 70–99)
Potassium: 2.3 mEq/L — CL (ref 3.7–5.3)
Sodium: 156 mEq/L — ABNORMAL HIGH (ref 137–147)
Total Protein: 6.6 g/dL (ref 6.0–8.3)

## 2014-07-13 LAB — T-HELPER CELLS (CD4) COUNT (NOT AT ARMC)
CD4 % Helper T Cell: 29 % — ABNORMAL LOW (ref 33–55)
CD4 T Cell Abs: 560 /uL (ref 400–2700)

## 2014-07-13 LAB — BASIC METABOLIC PANEL
Anion gap: 16 — ABNORMAL HIGH (ref 5–15)
Anion gap: 18 — ABNORMAL HIGH (ref 5–15)
Anion gap: 13 (ref 5–15)
BUN: 18 mg/dL (ref 6–23)
BUN: 14 mg/dL (ref 6–23)
BUN: 23 mg/dL (ref 6–23)
CALCIUM: 7.4 mg/dL — AB (ref 8.4–10.5)
CALCIUM: 7.6 mg/dL — AB (ref 8.4–10.5)
CHLORIDE: 116 meq/L — AB (ref 96–112)
CO2: 29 mEq/L (ref 19–32)
CO2: 21 mEq/L (ref 19–32)
CO2: 24 meq/L (ref 19–32)
CREATININE: 1.69 mg/dL — AB (ref 0.50–1.10)
CREATININE: 1.53 mg/dL — AB (ref 0.50–1.10)
Calcium: 7.3 mg/dL — ABNORMAL LOW (ref 8.4–10.5)
Chloride: 105 mEq/L (ref 96–112)
Chloride: 118 mEq/L — ABNORMAL HIGH (ref 96–112)
Creatinine, Ser: 1.69 mg/dL — ABNORMAL HIGH (ref 0.50–1.10)
GFR calc Af Amer: 37 mL/min — ABNORMAL LOW (ref >90)
GFR calc Af Amer: 42 mL/min — ABNORMAL LOW (ref >90)
GFR calc non Af Amer: 36 mL/min — ABNORMAL LOW (ref >90)
GFR, EST AFRICAN AMERICAN: 37 mL/min — AB (ref >90)
GFR, EST NON AFRICAN AMERICAN: 32 mL/min — AB (ref >90)
GFR, EST NON AFRICAN AMERICAN: 32 mL/min — AB (ref >90)
Glucose, Bld: 269 mg/dL — ABNORMAL HIGH (ref 70–99)
Glucose, Bld: 247 mg/dL — ABNORMAL HIGH (ref 70–99)
Glucose, Bld: 126 mg/dL — ABNORMAL HIGH (ref 70–99)
Potassium: <2.2 mEq/L — CL (ref 3.7–5.3)
Potassium: 2.9 mEq/L — CL (ref 3.7–5.3)
Potassium: 3.1 mEq/L — ABNORMAL LOW (ref 3.7–5.3)
Sodium: 150 mEq/L — ABNORMAL HIGH (ref 137–147)
Sodium: 155 mEq/L — ABNORMAL HIGH (ref 137–147)
Sodium: 155 mEq/L — ABNORMAL HIGH (ref 137–147)

## 2014-07-13 LAB — PHOSPHORUS

## 2014-07-13 LAB — CK TOTAL AND CKMB (NOT AT ARMC)
CK, MB: 2.7 ng/mL (ref 0.3–4.0)
RELATIVE INDEX: 0.5 (ref 0.0–2.5)
Total CK: 541 U/L — ABNORMAL HIGH (ref 7–177)

## 2014-07-13 LAB — LACTIC ACID, PLASMA: Lactic Acid, Venous: 2.4 mmol/L — ABNORMAL HIGH (ref 0.5–2.2)

## 2014-07-13 LAB — GLUCOSE, CAPILLARY
GLUCOSE-CAPILLARY: 243 mg/dL — AB (ref 70–99)
GLUCOSE-CAPILLARY: 190 mg/dL — AB (ref 70–99)
Glucose-Capillary: 255 mg/dL — ABNORMAL HIGH (ref 70–99)
Glucose-Capillary: 134 mg/dL — ABNORMAL HIGH (ref 70–99)
Glucose-Capillary: 138 mg/dL — ABNORMAL HIGH (ref 70–99)
Glucose-Capillary: 154 mg/dL — ABNORMAL HIGH (ref 70–99)
Glucose-Capillary: 280 mg/dL — ABNORMAL HIGH (ref 70–99)
Glucose-Capillary: 286 mg/dL — ABNORMAL HIGH (ref 70–99)
Glucose-Capillary: 300 mg/dL — ABNORMAL HIGH (ref 70–99)
Glucose-Capillary: 340 mg/dL — ABNORMAL HIGH (ref 70–99)
Glucose-Capillary: 187 mg/dL — ABNORMAL HIGH (ref 70–99)
Glucose-Capillary: 226 mg/dL — ABNORMAL HIGH (ref 70–99)

## 2014-07-13 LAB — CORTISOL: Cortisol, Plasma: 66.7 ug/dL

## 2014-07-13 LAB — TROPONIN I: Troponin I: 0.99 ng/mL (ref <0.30)

## 2014-07-13 LAB — MAGNESIUM: Magnesium: 1.9 mg/dL (ref 1.5–2.5)

## 2014-07-13 LAB — URINE CULTURE
Colony Count: NO GROWTH
Culture: NO GROWTH

## 2014-07-13 MED ORDER — POTASSIUM CHLORIDE 10 MEQ/50ML IV SOLN
INTRAVENOUS | Status: AC
  Administered 2014-07-13: 10 meq via INTRAVENOUS
  Filled 2014-07-13: qty 50

## 2014-07-13 MED ORDER — IOHEXOL 350 MG/ML SOLN
50.0000 mL | Freq: Once | INTRAVENOUS | Status: AC | PRN
  Administered 2014-07-13: 50 mL via INTRAVENOUS

## 2014-07-13 MED ORDER — POTASSIUM CHLORIDE 20 MEQ/15ML (10%) PO LIQD
40.0000 meq | Freq: Once | ORAL | Status: AC
  Administered 2014-07-13: 40 meq
  Filled 2014-07-13: qty 30

## 2014-07-13 MED ORDER — PHENYLEPHRINE HCL 10 MG/ML IJ SOLN
30.0000 ug/min | INTRAMUSCULAR | Status: DC
  Administered 2014-07-13: 40 ug/min via INTRAVENOUS
  Administered 2014-07-13: 100 ug/min via INTRAVENOUS
  Administered 2014-07-14: 50 ug/min via INTRAVENOUS
  Administered 2014-07-15: 20 ug/min via INTRAVENOUS
  Administered 2014-07-15: 25 ug/min via INTRAVENOUS
  Administered 2014-07-16: 20 ug/min via INTRAVENOUS
  Administered 2014-07-17: 200 ug/min via INTRAVENOUS
  Administered 2014-07-17: 70 ug/min via INTRAVENOUS
  Administered 2014-07-17: 200 ug/min via INTRAVENOUS
  Administered 2014-07-18: 150.133 ug/min via INTRAVENOUS
  Administered 2014-07-18: 200 ug/min via INTRAVENOUS
  Administered 2014-07-18: 100 ug/min via INTRAVENOUS
  Administered 2014-07-18: 200 ug/min via INTRAVENOUS
  Administered 2014-07-19: 40 ug/min via INTRAVENOUS
  Administered 2014-07-19: 100 ug/min via INTRAVENOUS
  Administered 2014-07-20: 32 ug/min via INTRAVENOUS
  Administered 2014-07-21: 40 ug/min via INTRAVENOUS
  Filled 2014-07-13 (×18): qty 4

## 2014-07-13 MED ORDER — SODIUM CHLORIDE 0.9 % IV SOLN
INTRAVENOUS | Status: DC
  Administered 2014-07-13: 1.7 [IU]/h via INTRAVENOUS
  Filled 2014-07-13: qty 1

## 2014-07-13 MED ORDER — ACETAMINOPHEN 160 MG/5ML PO SOLN
650.0000 mg | ORAL | Status: DC | PRN
  Administered 2014-07-13 – 2014-07-19 (×6): 650 mg
  Filled 2014-07-13 (×6): qty 20.3

## 2014-07-13 MED ORDER — POTASSIUM CHLORIDE 10 MEQ/50ML IV SOLN
10.0000 meq | INTRAVENOUS | Status: AC
  Administered 2014-07-13 (×6): 10 meq via INTRAVENOUS
  Filled 2014-07-13 (×6): qty 50

## 2014-07-13 MED ORDER — POTASSIUM CHLORIDE 20 MEQ/15ML (10%) PO LIQD
ORAL | Status: AC
  Administered 2014-07-13: 40 meq
  Filled 2014-07-13: qty 30

## 2014-07-13 MED ORDER — DEXMEDETOMIDINE HCL IN NACL 200 MCG/50ML IV SOLN
0.0000 ug/kg/h | INTRAVENOUS | Status: DC
  Administered 2014-07-13 (×2): 0.2 ug/kg/h via INTRAVENOUS
  Administered 2014-07-14: 0.4 ug/kg/h via INTRAVENOUS
  Administered 2014-07-14: 0.2 ug/kg/h via INTRAVENOUS
  Administered 2014-07-14: 0.6 ug/kg/h via INTRAVENOUS
  Administered 2014-07-14: 1 ug/kg/h via INTRAVENOUS
  Administered 2014-07-15: 1.2 ug/kg/h via INTRAVENOUS
  Administered 2014-07-15: 1 ug/kg/h via INTRAVENOUS
  Administered 2014-07-15: 1.2 ug/kg/h via INTRAVENOUS
  Administered 2014-07-15: 0.5 ug/kg/h via INTRAVENOUS
  Administered 2014-07-15: 1.2 ug/kg/h via INTRAVENOUS
  Administered 2014-07-15 (×2): 1 ug/kg/h via INTRAVENOUS
  Filled 2014-07-13 (×3): qty 50
  Filled 2014-07-13: qty 100
  Filled 2014-07-13 (×4): qty 50
  Filled 2014-07-13: qty 100
  Filled 2014-07-13: qty 50

## 2014-07-13 MED ORDER — POTASSIUM CHLORIDE 10 MEQ/50ML IV SOLN
10.0000 meq | INTRAVENOUS | Status: AC
Start: 2014-07-13 — End: 2014-07-14
  Administered 2014-07-13 – 2014-07-14 (×4): 10 meq via INTRAVENOUS
  Filled 2014-07-13: qty 50

## 2014-07-13 MED ORDER — POTASSIUM CL IN DEXTROSE 5% 20 MEQ/L IV SOLN
20.0000 meq | INTRAVENOUS | Status: DC
  Administered 2014-07-13 (×2): 20 meq via INTRAVENOUS
  Filled 2014-07-13 (×3): qty 1000

## 2014-07-13 MED ORDER — POTASSIUM CHLORIDE 10 MEQ/50ML IV SOLN
10.0000 meq | INTRAVENOUS | Status: AC
  Administered 2014-07-13 (×6): 10 meq via INTRAVENOUS
  Filled 2014-07-13: qty 50

## 2014-07-13 MED ORDER — POTASSIUM CHLORIDE 20 MEQ/15ML (10%) PO LIQD
ORAL | Status: AC
  Filled 2014-07-13: qty 30

## 2014-07-13 MED ORDER — SODIUM CHLORIDE 0.9 % IV SOLN
INTRAVENOUS | Status: DC
  Administered 2014-07-13: 04:00:00 via INTRAVENOUS

## 2014-07-13 NOTE — Consult Note (Signed)
Referring Physician: Dr. Lake Bells    Chief Complaint: New-onset right-sided weakness.  HPI: [name] is an 58 y.o. female with a history of HIV infection, nonischemic cardiomyopathy, peripheral vascular disease, hypercholesterolemia and diabetes mellitus who is 3 days postop right BK amputation, noted to not be moving her right side. She is currently intubated and on mechanical ventilation, but on no sedating medications. She was transferred to the intensive care unit following onset of hypotension, respiratory distress and worsening of metabolic acidosis at around 1 PM on 07/12/2014. She was last seen moving the right arm and hand at around 8 PM tonight. Stat CT scan of her head showed no acute intracranial abnormality. Patient has been on aspirin 81 mg per day. Patient was beyond time window for treatment consideration with TPA at the time of this evaluation. She had a low-grade temp of 100.5. She's currently on Zosyn and vancomycin.  LSN: 8 PM on 07/12/2014 tPA Given: No: Beyond time window for TPA intervention MRankin: Indeterminate  Past Medical History  Diagnosis Date  . Depression   . Neuropathy   . Mood disorder   . TIA (transient ischemic attack)   . Pancreatitis chronic   . Chronic pain   . HIV (human immunodeficiency virus infection) 1990's    takes Norpramin nightly  . Polysubstance abuse   . Cardiomyopathy, nonischemic     EF 45%  . Peripheral vascular disease   . Asthma     "used to when I was young" (02/20/2013)  . Arthritis     "legs and arms" (02/20/2013)  . Hypercholesterolemia     takes Lipitor nightly  . Seasonal allergies     takes Zyrtec daily  . Anemia     takes Iron pill daily  . Nausea     takes Zofran prn  . GERD (gastroesophageal reflux disease)     takes Prilosec daily  . Insomnia     takes Trazodone nightly  . Constipation     takes miralax prn and stool softener daily  . Type II diabetes mellitus     lantus 45units at bedtime  . Dysrhythmia      takes Metoprolol daily  . Weakness of both legs   . Peripheral neuropathy   . Chronic back pain   . Hepatitis C     C  . Urinary frequency   . Nocturia     Family History  Problem Relation Age of Onset  . Coronary artery disease Father 50     Medications: I have reviewed the patient's current medications.  ROS: Unavailable.  Physical Examination: Blood pressure 111/71, pulse 116, temperature 100.5 F (38.1 C), temperature source Oral, resp. rate 27, height 5\' 4"  (1.626 m), weight 72.7 kg (160 lb 4.4 oz), SpO2 95.00%.  Neurologic Examination: Responds to tactile and noxious stimuli with eye opening. No clear visual tracking was noted. There was no gaze preference to side. Pupils were equal and reacted normally to light. Extraocular movements were intact to oculocephalic maneuvers. No facial weakness was noted. Patient had spontaneous movements of left upper extremity and actively withdrew left lower extremity to noxious stimuli. She had no response to noxious stimulus to the right upper extremity and minimal response to lower extremity noxious stimulus. Muscle tone was flaccid throughout. Deep tendon reflexes were trace to 1+ and symmetrical. Bilateral BK amputations noted.  Ct Head Wo Contrast  07/13/2014   CLINICAL DATA:  Altered mental status.  EXAM: CT HEAD WITHOUT CONTRAST  TECHNIQUE: Contiguous axial  images were obtained from the base of the skull through the vertex without intravenous contrast.  COMPARISON:  06/22/2007  FINDINGS: Skull and Sinuses:Complete opacification of the imaged portions of the left maxillary sinus, new from 2008. There is wall thickening and internal high density. No invasive features surrounding the left maxillary sinus.  No acute fracture destructive process.  Orbits: No acute abnormality.  Brain: No evidence of acute abnormality, such as acute infarction, hemorrhage, hydrocephalus, or mass lesion/mass effect. Remote appearing small vessel infarct  in the mid left cerebellum. Chronic vessel disease with pattern similar to 2008. Ischemic gliosis is most notable in the internal capsule and corona radiata on the left.  IMPRESSION: 1. No acute intracranial findings. 2. Chronic ischemic injuries described above. 3. Chronically obstructed left maxillary sinus.   Electronically Signed   By: Jorje Guild M.D.   On: 07/13/2014 00:41   Dg Chest Port 1 View  07/12/2014   CLINICAL DATA:  Intubation  EXAM: PORTABLE CHEST - 1 VIEW  COMPARISON:  07/12/2014 at 5:30 a.m.  FINDINGS: New endotracheal tube tip lies at the upper margin of the carina.  Lung volumes are low. Hazy bilateral lung opacity seen earlier is unchanged. No pneumothorax.  New left PICC has its tip projecting in the region of the caval atrial junction.  IMPRESSION: Endotracheal tube tip lies at the level of the upper carina.  Left PICC tip lies at the caval atrial junction.  No change in lung aeration.   Electronically Signed   By: Lajean Manes M.D.   On: 07/12/2014 14:07   Dg Chest Port 1 View  07/12/2014   CLINICAL DATA:  Shortness of breath.  EXAM: PORTABLE CHEST - 1 VIEW  COMPARISON:  Chest x-ray from yesterday.  FINDINGS: Lower lung volumes which accounts for diffuse increase in vascular prominence. There may also be background pulmonary edema. Bibasilar lung opacity persists, with air bronchograms. No increasing pleural fluid. No pneumothorax.  Likely no cardiomegaly when considering technical factors.  Chronic pancreatitis with diffuse parenchymal calcifications. This is known per the electronic medical record.  IMPRESSION: 1. Lower lung volumes. 2. Persistent bibasilar opacities which could reflect atelectasis or pneumonia. 3. Probable superimposed pulmonary edema.   Electronically Signed   By: Jorje Guild M.D.   On: 07/12/2014 06:52   Dg Chest Port 1 View  07/11/2014   CLINICAL DATA:  Shortness of breath  EXAM: PORTABLE CHEST - 1 VIEW  COMPARISON:  07/10/2014  FINDINGS: Bibasilar  opacities with air bronchograms best seen in the retrocardiac lung. Questionable improvement in right basilar aeration. Pulmonary venous congestion. Borderline cardiomegaly. Negative upper mediastinal contours. No evidence of increasing pleural fluid. No pneumothorax.  IMPRESSION: Persistent basilar lung opacities which could reflect pneumonia or atelectasis.   Electronically Signed   By: Jorje Guild M.D.   On: 07/11/2014 05:16    Assessment: 58 y.o. female with multiple risk factors for stroke, with likely right MCA territory acute cerebral infarction. Patient was beyond time window for intervention with TPA at the time of this consultation. 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 In addition high-grade stenosis was noted at the terminus of the right carotid artery. Given her overall medical condition I think that she would likely be a candidate for interventional radiology procedures.  Stroke Risk Factors - diabetes mellitus and  hyperlipidemia  Plan: 1. HgbA1c, fasting lipid panel 2. MRI of the brain without contrast 3. PT consult, OT consult, Speech consult 4. Echocardiogram 5. Prophylactic therapy-Antiplatelet med: Aspirin  6. Risk factor modification 7. Telemetry monitoring   C.R. Nicole Kindred, MD [facility]  07/13/2014, 12:49 AM

## 2014-07-13 NOTE — Progress Notes (Signed)
ANTIBIOTIC CONSULT NOTE - INITIAL  Pharmacy Consult for vanc and zosyn Indication: r/o aspiration PNA  No Known Allergies  Patient Measurements: Height: 5\' 4"  (162.6 cm) Weight: 132 lb 7.9 oz (60.1 kg) IBW/kg (Calculated) : 54.7  Vital Signs: Temp: 101 F (38.3 C) (08/07 0751) Temp src: Oral (08/07 0426) BP: 107/69 mmHg (08/07 1100) Pulse Rate: 112 (08/07 1115) Intake/Output from previous day: 08/06 0701 - 08/07 0700 In: 3749.9 [I.V.:3149.9; IV Piggyback:600] Out: 2440 [Urine:2375] Intake/Output from this shift: Total I/O In: 895.8 [I.V.:445.8; IV Piggyback:450] Out: 600 [Urine:600]  Labs:  Recent Labs  07/11/14 0330 07/12/14 0407 07/12/14 0842  07/12/14 1910 07/12/14 2100 07/13/14  WBC 25.6* 25.4* 26.9*  --   --   --   --   HGB 9.7* 9.5* 9.6*  --   --   --   --   PLT 253 278 343  --   --   --   --   CREATININE 1.41* 1.79*  --   < > 1.95* 1.56* 1.69*  < > = values in this interval not displayed. Estimated Creatinine Clearance: 31 ml/min (by C-G formula based on Cr of 1.69). No results found for this basename: VANCOTROUGH, Corlis Leak, VANCORANDOM, [place], GENTPEAK, GENTRANDOM, TOBRATROUGH, TOBRAPEAK, TOBRARND, AMIKACINPEAK, AMIKACINTROU, AMIKACIN,  in the last 72 hours   Microbiology: Recent Results (from the past 720 hour(s))  CULTURE, BLOOD (ROUTINE X 2)     Status: None   Collection Time    06/12/2014  2:40 PM      Result Value Ref Range Status   Specimen Description BLOOD LEFT HAND   Final   Special Requests BOTTLES DRAWN AEROBIC AND ANAEROBIC 2CC   Final   Culture  Setup Time     Final   Value: 07/06/2014 21:54     Performed at [facility]   Culture     Final   Value: NO GROWTH 5 DAYS     Performed at [facility]   Report Status 07/10/2014 FINAL   Final  MRSA PCR SCREENING     Status: Abnormal   Collection Time    07/05/2014  4:46 PM      Result Value Ref Range Status   MRSA by PCR POSITIVE (*) NEGATIVE Final   Comment:          The GeneXpert MRSA Assay (FDA     approved for NASAL specimens     only), is one component of a     comprehensive MRSA colonization     surveillance program. It is not     intended to diagnose MRSA     infection nor to guide or     monitor treatment for     MRSA infections.     RESULT CALLED TO, READ BACK BY AND VERIFIED WITH:     Durenda Age RN 19:05 06/20/2014 ([email or web address])  CULTURE, BLOOD (ROUTINE X 2)     Status: None   Collection Time    06/11/2014  6:12 PM      Result Value Ref Range Status   Specimen Description BLOOD HAND RIGHT   Final   Special Requests BOTTLES DRAWN AEROBIC AND ANAEROBIC 2CC   Final   Culture  Setup Time     Final   Value: 07/05/2014 00:36     Performed at [facility]   Culture     Final   Value: NO GROWTH 5 DAYS     Performed at [place]  Status 07/11/2014 FINAL   Final  URINE CULTURE     Status: None   Collection Time    06/06/2014 10:40 PM      Result Value Ref Range Status   Specimen Description URINE, RANDOM   Final   Special Requests NONE   Final   Culture  Setup Time     Final   Value: 06/07/2014 22:57     Performed at [facility] Count     Final   Value: NO GROWTH     Performed at [facility]   Culture     Final   Value: NO GROWTH     Performed at [facility]   Report Status 07/05/2014 FINAL   Final  CULTURE, BLOOD (ROUTINE X 2)     Status: None   Collection Time    07/10/14  9:20 AM      Result Value Ref Range Status   Specimen Description BLOOD RIGHT ARM   Final   Special Requests BOTTLES DRAWN AEROBIC ONLY 5CC   Final   Culture  Setup Time     Final   Value: 07/10/2014 15:54     Performed at [facility]   Culture     Final   Value:        BLOOD CULTURE RECEIVED NO GROWTH TO DATE CULTURE WILL BE HELD FOR 5 DAYS BEFORE ISSUING A FINAL NEGATIVE REPORT     Performed at [facility]   Report Status PENDING   Incomplete  CULTURE, BLOOD (ROUTINE X 2)     Status:  None   Collection Time    07/10/14  9:31 AM      Result Value Ref Range Status   Specimen Description BLOOD LEFT HAND   Final   Special Requests BOTTLES DRAWN AEROBIC AND ANAEROBIC 10CC   Final   Culture  Setup Time     Final   Value: 07/10/2014 15:54     Performed at [facility]   Culture     Final   Value:        BLOOD CULTURE RECEIVED NO GROWTH TO DATE CULTURE WILL BE HELD FOR 5 DAYS BEFORE ISSUING A FINAL NEGATIVE REPORT     Performed at [facility]   Report Status PENDING   Incomplete  MRSA PCR SCREENING     Status: None   Collection Time    07/10/14  4:45 PM      Result Value Ref Range Status   MRSA by PCR NEGATIVE  NEGATIVE Final   Comment:            The GeneXpert MRSA Assay (FDA     approved for NASAL specimens     only), is one component of a     comprehensive MRSA colonization     surveillance program. It is not     intended to diagnose MRSA     infection nor to guide or     monitor treatment for     MRSA infections.  CULTURE, RESPIRATORY (NON-EXPECTORATED)     Status: None   Collection Time    07/12/14  4:15 PM      Result Value Ref Range Status   Specimen Description TRACHEAL ASPIRATE   Final   Special Requests Immunocompromised   Final   Gram Stain     Final   Value: MODERATE WBC PRESENT,BOTH PMN AND MONONUCLEAR     FEW SQUAMOUS EPITHELIAL CELLS  PRESENT     RARE GRAM POSITIVE COCCI     IN PAIRS RARE GRAM VARIABLE ROD     Performed at [facility]   Culture     Final   Value: NO GROWTH     Performed at [facility]   Report Status PENDING   Incomplete    Medical History: Past Medical History  Diagnosis Date  . Depression   . Neuropathy   . Mood disorder   . TIA (transient ischemic attack)   . Pancreatitis chronic   . Chronic pain   . HIV (human immunodeficiency virus infection) 1990's    takes Norpramin nightly  . Polysubstance abuse   . Cardiomyopathy, nonischemic     EF 45%  . Peripheral vascular disease   .  Asthma     "used to when I was young" (02/20/2013)  . Arthritis     "legs and arms" (02/20/2013)  . Hypercholesterolemia     takes Lipitor nightly  . Seasonal allergies     takes Zyrtec daily  . Anemia     takes Iron pill daily  . Nausea     takes Zofran prn  . GERD (gastroesophageal reflux disease)     takes Prilosec daily  . Insomnia     takes Trazodone nightly  . Constipation     takes miralax prn and stool softener daily  . Type II diabetes mellitus     lantus 45units at bedtime  . Dysrhythmia     takes Metoprolol daily  . Weakness of both legs   . Peripheral neuropathy   . Chronic back pain   . Hepatitis C     C  . Urinary frequency   . Nocturia     Medications:  Anti-infectives   Start     Dose/Rate Route Frequency Ordered Stop   07/10/14 1200  piperacillin-tazobactam (ZOSYN) IVPB 3.375 g     3.375 g 12.5 mL/hr over 240 Minutes Intravenous Every 8 hours 07/10/14 0844     07/10/14 1000  vancomycin (VANCOCIN) IVPB 750 mg/150 ml premix     750 mg 150 mL/hr over 60 Minutes Intravenous Every 24 hours 07/10/14 0855     07/07/2014 2200  cefUROXime (ZINACEF) 1.5 g in dextrose 5 % 50 mL IVPB     1.5 g 100 mL/hr over 30 Minutes Intravenous Every 12 hours 08/03/2014 1423 07/10/14 1123   07/27/2014 0600  [MAR Hold]  cefUROXime (ZINACEF) 1.5 g in dextrose 5 % 50 mL IVPB     (On MAR Hold since 08/04/2014 0923)   1.5 g 100 mL/hr over 30 Minutes Intravenous On call to O.R. 07/06/14 1047 07/08/2014 1034   07/05/14 1000  emtricitabine-tenofovir (TRUVADA) 200-300 MG per tablet 1 tablet  Status:  Discontinued     1 tablet Oral Daily 06/23/2014 1616 06/17/2014 1839   06/13/2014 1700  dolutegravir (TIVICAY) tablet 50 mg  Status:  Discontinued     50 mg Oral Daily 06/18/2014 1616 07/01/2014 1839     Assessment: 58 yo F initially presented with R foot pain. 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 Zosyn and Vanc started 8/4 for possible aspiration pneumonia. Pt is afebrile but WBC elevated at 26.9. Scr is also elevated and  trending up at 1.61. Known history of HIV but ID is observing off anti-retrovirals for now. Vanc dose 8/6 not charted so vanc trough would not be accurate at this time.  Zosyn 8/4>> Vanc 8/4>>  Goal of Therapy:  Eradication of infection  Plan:  - Continue Zosyn 3.375gm IV Q8H extended infusion - Continue Vanc 750mg  IV Q24H - Monitor clinical progress, renal fxn, and cultures - VT at ss   Thank you for allowing pharmacy to be part of this patient's care team  [place], Pharm.D Clinical Pharmacy Resident Pager: [phone] 07/13/2014 .1:17 PM

## 2014-07-13 NOTE — Progress Notes (Addendum)
  Progress Note    07/13/2014 2:30 PM 4 Days Post-Op  Subjective:  Sedated on vent  Tm 101.9 now 100.5   Filed Vitals:   07/13/14 1210  BP:   Pulse:   Temp: 101.9 F (38.8 C)  Resp:     Physical Exam: Incisions:  C/d/i with staples in tact.  Minimal serous drainage on bandage.   CBC    Component Value Date/Time   WBC 26.9* 07/12/2014 0842   RBC 3.19* 07/12/2014 0842   HGB 9.6* 07/12/2014 0842   HCT 29.8* 07/12/2014 0842   PLT 343 07/12/2014 0842   MCV 93.4 07/12/2014 0842   MCH 30.1 07/12/2014 0842   MCHC 32.2 07/12/2014 0842   RDW 19.2* 07/12/2014 0842   LYMPHSABS 0.8 07/12/2014 0842   MONOABS 1.6* 07/12/2014 0842   EOSABS 0.0 07/12/2014 0842   BASOSABS 0.0 07/12/2014 0842    BMET    Component Value Date/Time   NA 156* 07/13/2014 0835   K 2.3* 07/13/2014 0835   CL 114* 07/13/2014 0835   CO2 24 07/13/2014 0835   GLUCOSE 172* 07/13/2014 0835   BUN 19 07/13/2014 0835   CREATININE 1.61* 07/13/2014 0835   CREATININE 1.01 11/21/2013 1506   CALCIUM 7.5* 07/13/2014 0835   GFRNONAA 34* 07/13/2014 0835   GFRNONAA 62 11/21/2013 1506   GFRAA 39* 07/13/2014 0835   GFRAA 71 11/21/2013 1506    INR    Component Value Date/Time   INR 1.01 01/10/2014 1534     Intake/Output Summary (Last 24 hours) at 07/13/14 1430 Last data filed at 07/13/14 1100  Gross per 24 hour  Intake 4451.95 ml  Output   2800 ml  Net 1651.95 ml     Assessment/Plan:  58 y.o. female is s/p right below knee amputation  4 Days Post-Op  -pt is critically ill with fever spikes and leukocytosis now on the vent -ABx-Vanc/Zosyn -wound looks good without evidence of any infection -may wrap stump with kerlix after Dr. Donnetta Hutching sees pt-discussed with RN -hypokalemia, hypernatremia tx per primary team   Leontine Locket, PA-C [facility] [phone] 07/13/2014 2:30 PM           I have examined the patient, reviewed and agree with above. Range critical ill. Continued respiratory deep compensation now intubated.  Right below-knee amputation that looks quite good with no issues regarding skin edge healing. Will see again on Monday. Please, partners over the weekend for vascular issues  Faria Casella, MD 07/13/2014 3:26 PM

## 2014-07-13 NOTE — Progress Notes (Signed)
[place] Progress Note Patient Name: [name] DOB: 09-19-1956 MRN: [id number]  Date of Service  07/13/2014   HPI/Events of Note   BMET> improved Na, profound hypokalemia persists Small anion gap  eICU Interventions  D/c bicarb gtt, d10, insulin Replete K aggressively Monitor BMET carefully, will likely need restart insulin later this morning   Intervention Category Major Interventions: Electrolyte abnormality - evaluation and management;Hyperglycemia - active titration of insulin therapy  MCQUAID, DOUGLAS 07/13/2014, 1:04 AM

## 2014-07-13 NOTE — Progress Notes (Signed)
Patient ID: [name], female   DOB: Feb 15, 1956, 58 y.o.   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 antiseptic oral rinse  7 mL Mouth Rinse QID  . aspirin EC  81 mg Oral Daily  . chlorhexidine  15 mL Mouth Rinse BID  . heparin  5,000 Units Subcutaneous 3 times per day  . multivitamin with minerals  1 tablet Per Tube Daily  . pantoprazole (PROTONIX) IV  40 mg Intravenous Daily  . piperacillin-tazobactam (ZOSYN)  IV  3.375 g Intravenous Q8H  . sodium chloride  10-40 mL Intracatheter Q12H  . sodium chloride  3 mL Intravenous Q12H  . vancomycin  750 mg Intravenous Q24H    Objective: Temp:  [97.3 F (36.3 C)-101 F (38.3 C)] 101 F (38.3 C) (08/07 0751) Pulse Rate:  [94-138] 116 (08/07 0900) Resp:  [20-39] 24 (08/07 0900) BP: (81-123)/(57-82) 92/70 mmHg (08/07 0900) SpO2:  [87 %-100 %] 96 % (08/07 0900) FiO2 (%):  [40 %-100 %] 40 % (08/07 0806) Weight:  [132 lb 7.9 oz (60.1 kg)] 132 lb 7.9 oz (60.1 kg) (08/07 0500)  General: She remains sedated on the ventilator  Skin: No rash Lungs: Clear anteriorly Cor: Tachycardic with regular S1 and S2. 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 Neuro: Not moving her right side  Lab Results Lab Results  Component Value Date   WBC 26.9* 07/12/2014   HGB 9.6* 07/12/2014   HCT 29.8* 07/12/2014   MCV 93.4 07/12/2014   PLT 343 07/12/2014    Lab Results  Component Value Date   CREATININE 1.69* 07/13/2014   BUN 23 07/13/2014   NA 150* 07/13/2014   K <2.2*  07/13/2014   CL 105 07/13/2014   CO2 29 07/13/2014    Lab Results  Component Value Date   ALT 17 06/19/2014   AST 25 07/02/2014   ALKPHOS 157* 06/12/2014   BILITOT <0.2* 06/11/2014      Microbiology: Recent Results (from the past 240 hour(s))  CULTURE, BLOOD (ROUTINE X 2)     Status: None   Collection Time    07/03/2014  2:40 PM      Result Value Ref Range Status   Specimen Description BLOOD LEFT HAND   Final   Special Requests BOTTLES DRAWN AEROBIC AND ANAEROBIC 2CC   Final   Culture  Setup Time     Final   Value: 06/20/2014 21:54     Performed at [facility]   Culture     Final   Value: NO GROWTH 5 DAYS     Performed at [facility]   Report Status 07/10/2014 FINAL   Final  MRSA PCR SCREENING     Status: Abnormal   Collection Time    06/25/2014  4:46 PM      Result Value Ref Range Status  MRSA by PCR POSITIVE (*) NEGATIVE Final   Comment:            The GeneXpert MRSA Assay (FDA     approved for NASAL specimens     only), is one component of a     comprehensive MRSA colonization     surveillance program. It is not     intended to diagnose MRSA     infection nor to guide or     monitor treatment for     MRSA infections.     RESULT CALLED TO, READ BACK BY AND VERIFIED WITH:     Durenda Age RN 19:05 07/03/2014 ([email or web address])  CULTURE, BLOOD (ROUTINE X 2)     Status: None   Collection Time    06/19/2014  6:12 PM      Result Value Ref Range Status   Specimen Description BLOOD HAND RIGHT   Final   Special Requests BOTTLES DRAWN AEROBIC AND ANAEROBIC 2CC   Final   Culture  Setup Time     Final   Value: 07/05/2014 00:36     Performed at [facility]   Culture     Final   Value: NO GROWTH 5 DAYS     Performed at [facility]   Report Status 07/11/2014 FINAL   Final  URINE CULTURE     Status: None   Collection Time    06/23/2014 10:40 PM      Result Value Ref Range Status   Specimen Description URINE, RANDOM   Final   Special Requests NONE   Final    Culture  Setup Time     Final   Value: 06/18/2014 22:57     Performed at [facility]     Final   Value: NO GROWTH     Performed at [facility]   Culture     Final   Value: NO GROWTH     Performed at [facility]   Report Status 07/05/2014 FINAL   Final  CULTURE, BLOOD (ROUTINE X 2)     Status: None   Collection Time    07/10/14  9:20 AM      Result Value Ref Range Status   Specimen Description BLOOD RIGHT ARM   Final   Special Requests BOTTLES DRAWN AEROBIC ONLY 5CC   Final   Culture  Setup Time     Final   Value: 07/10/2014 15:54     Performed at [facility]   Culture     Final   Value:        BLOOD CULTURE RECEIVED NO GROWTH TO DATE CULTURE WILL BE HELD FOR 5 DAYS BEFORE ISSUING A FINAL NEGATIVE REPORT     Performed at [facility]   Report Status PENDING   Incomplete  CULTURE, BLOOD (ROUTINE X 2)     Status: None   Collection Time    07/10/14  9:31 AM      Result Value Ref Range Status   Specimen Description BLOOD LEFT HAND   Final   Special Requests BOTTLES DRAWN AEROBIC AND ANAEROBIC 10CC   Final   Culture  Setup Time     Final   Value: 07/10/2014 15:54     Performed at [facility]   Culture     Final   Value:        BLOOD CULTURE RECEIVED NO GROWTH TO DATE CULTURE WILL BE HELD FOR 5  DAYS BEFORE ISSUING A FINAL NEGATIVE REPORT     Performed at [facility]   Report Status PENDING   Incomplete  MRSA PCR SCREENING     Status: None   Collection Time    07/10/14  4:45 PM      Result Value Ref Range Status   MRSA by PCR NEGATIVE  NEGATIVE Final   Comment:            The GeneXpert MRSA Assay (FDA     approved for NASAL specimens     only), is one component of a     comprehensive MRSA colonization     surveillance program. It is not     intended to diagnose MRSA     infection nor to guide or     monitor treatment for     MRSA infections.  CULTURE, RESPIRATORY (NON-EXPECTORATED)     Status: None    Collection Time    07/12/14  4:15 PM      Result Value Ref Range Status   Specimen Description TRACHEAL ASPIRATE   Final   Special Requests Immunocompromised   Final   Gram Stain     Final   Value: MODERATE WBC PRESENT,BOTH PMN AND MONONUCLEAR     FEW SQUAMOUS EPITHELIAL CELLS PRESENT     RARE GRAM POSITIVE COCCI     IN PAIRS RARE GRAM VARIABLE ROD     Performed at [facility]   Culture     Final   Value: NO GROWTH     Performed at [facility]   Report Status PENDING   Incomplete    Studies/Results: Ct Angio Head W/cm &/or Wo Cm  07/13/2014   CLINICAL DATA:  Unable to move right side, acute stroke.  EXAM: CT ANGIOGRAPHY HEAD AND NECK  TECHNIQUE: Multidetector CT imaging of the head and neck was performed using the standard protocol during bolus administration of intravenous contrast. Multiplanar CT image reconstructions and MIPs were obtained to evaluate the vascular anatomy. Carotid stenosis measurements (when applicable) are obtained utilizing NASCET criteria, using the distal internal carotid diameter as the denominator.  CONTRAST:  43mL OMNIPAQUE IOHEXOL 350 MG/ML SOLN  COMPARISON:  CT of the head July 12, 2014 at 2335 hr  FINDINGS: CTA HEAD FINDINGS  Anterior circulation: Petrous, cavernous internal carotid arteries are patent. Severe calcific atherosclerosis of the carotid siphons with at least 50% narrowing and irregularity bilaterally. High-grade stenosis of the right carotid terminus. Diminutive right A1 segment with robust anterior communicating artery and left A1 segment. Anterior and middle cerebral arteries appear widely patent.  Posterior circulation: The left vertebral artery predominantly terminates in the left posterior-inferior cerebellar artery. Diminutive though patent bilateral vertebral arteries. Basilar artery is patent with may normal main branch vessels. Robust left posterior communicating artery. Normal appearance of the posterior cerebral arteries.   Remote left cerebellar infarct. No midline shift or mass effect. No hydrocephalus. No abnormal enhancement.  Review of the MIP images confirms the above findings.  CTA NECK FINDINGS  Normal appearance of the thoracic arch, mild calcific atherosclerosis. Aberrant right subclavian artery, coursing posterior to the trachea and esophagus. Approximately 75% stenosis of the right subclavian artery due to intimal thickening focally, with calcific atherosclerosis and intimal thickening, the vessel remains patent. At least 50% stenosis of the left subclavian artery approximately 27 mm above the origin.  Right Common carotid artery is the first vessel arising from the aortic arch, with at least 50% narrowing. At least  50% narrowing of the origin of the left Common carotid artery due to intimal thickening. Common carotid arteries are normal in course and caliber, widely patent. Mild intimal irregularity bilaterally consistent with atherosclerosis. Intimal thickening calcific atherosclerosis of the carotid bulbs, soft tissue about the right carotid bifurcation with surgical clips suggest prior carotid endarterectomy. Calcific atherosclerosis narrows the origin of the left internal carotid artery to 2 mm, resulting in approximately 66% stenosis by NASCET criteria.  High-grade stenosis the origin of the right vertebral artery, at which arises from the upper right subclavian artery at of locus of intimal thickening and calcification. Occluded origin of the left vertebral artery, with reconstitution at the left foraminal segment. The ready irregular left vertebral artery foraminal segment to level of C2-3.  No hemodynamically significant stenosis by NASCET criteria. No dissection, no pseudoaneurysm. No abnormal luminal irregularity. No contrast extravasation.  15 mm right thyroid nodule. No acute osseous process though bone windows have not been submitted. Included view of the lungs demonstrates interstitial prominence and  ground-glass opacities suggesting pulmonary edema.  Review of the MIP images confirms the above findings.  IMPRESSION: CTA neck: Aberrant right subclavian artery with at least 75% stenosis of the right subclavian artery, and 50% stenosis of the left subclavian artery due to calcific atherosclerosis.  Occluded origin left vertebral artery with irregular intraforaminal segment which may reflect chronic dissection, vessel remains patent. High-grade stenosis of the origin of the right vertebral artery.  Status post apparent right carotid endarterectomy. Approximately 66% narrowing of the left internal carotid artery origin by NASCET criteria.  CTA head: Calcific atherosclerosis of the carotid siphons with at least 50% stenosis.  High-grade stenosis of the right carotid terminus. No large vessel occlusion.   Electronically Signed   By: Elon [name]   On: 07/13/2014 03:44   Ct Head Wo Contrast  07/13/2014   CLINICAL DATA:  Altered mental status.  EXAM: CT HEAD WITHOUT CONTRAST  TECHNIQUE: Contiguous axial images were obtained from the base of the skull through the vertex without intravenous contrast.  COMPARISON:  06/22/2007  FINDINGS: Skull and Sinuses:Complete opacification of the imaged portions of the left maxillary sinus, new from 2008. There is wall thickening and internal high density. No invasive features surrounding the left maxillary sinus.  No acute fracture destructive process.  Orbits: No acute abnormality.  Brain: No evidence of acute abnormality, such as acute infarction, hemorrhage, hydrocephalus, or mass lesion/mass effect. Remote appearing small vessel infarct in the mid left cerebellum. Chronic vessel disease with pattern similar to 2008. Ischemic gliosis is most notable in the internal capsule and corona radiata on the left.  IMPRESSION: 1. No acute intracranial findings. 2. Chronic ischemic injuries described above. 3. Chronically obstructed left maxillary sinus.   Electronically Signed   By:  Jorje Guild M.D.   On: 07/13/2014 00:41   Ct Angio Neck W/cm &/or Wo/cm  07/13/2014   CLINICAL DATA:  Unable to move right side, acute stroke.  EXAM: CT ANGIOGRAPHY HEAD AND NECK  TECHNIQUE: Multidetector CT imaging of the head and neck was performed using the standard protocol during bolus administration of intravenous contrast. Multiplanar CT image reconstructions and MIPs were obtained to evaluate the vascular anatomy. Carotid stenosis measurements (when applicable) are obtained utilizing NASCET criteria, using the distal internal carotid diameter as the denominator.  CONTRAST:  68mL OMNIPAQUE IOHEXOL 350 MG/ML SOLN  COMPARISON:  CT of the head July 12, 2014 at 2335 hr  FINDINGS: CTA HEAD FINDINGS  Anterior circulation: Petrous,  cavernous internal carotid arteries are patent. Severe calcific atherosclerosis of the carotid siphons with at least 50% narrowing and irregularity bilaterally. High-grade stenosis of the right carotid terminus. Diminutive right A1 segment with robust anterior communicating artery and left A1 segment. Anterior and middle cerebral arteries appear widely patent.  Posterior circulation: The left vertebral artery predominantly terminates in the left posterior-inferior cerebellar artery. Diminutive though patent bilateral vertebral arteries. Basilar artery is patent with may normal main branch vessels. Robust left posterior communicating artery. Normal appearance of the posterior cerebral arteries.  Remote left cerebellar infarct. No midline shift or mass effect. No hydrocephalus. No abnormal enhancement.  Review of the MIP images confirms the above findings.  CTA NECK FINDINGS  Normal appearance of the thoracic arch, mild calcific atherosclerosis. Aberrant right subclavian artery, coursing posterior to the trachea and esophagus. Approximately 75% stenosis of the right subclavian artery due to intimal thickening focally, with calcific atherosclerosis and intimal thickening, the vessel  remains patent. At least 50% stenosis of the left subclavian artery approximately 27 mm above the origin.  Right Common carotid artery is the first vessel arising from the aortic arch, with at least 50% narrowing. At least 50% narrowing of the origin of the left Common carotid artery due to intimal thickening. Common carotid arteries are normal in course and caliber, widely patent. Mild intimal irregularity bilaterally consistent with atherosclerosis. Intimal thickening calcific atherosclerosis of the carotid bulbs, soft tissue about the right carotid bifurcation with surgical clips suggest prior carotid endarterectomy. Calcific atherosclerosis narrows the origin of the left internal carotid artery to 2 mm, resulting in approximately 66% stenosis by NASCET criteria.  High-grade stenosis the origin of the right vertebral artery, at which arises from the upper right subclavian artery at of locus of intimal thickening and calcification. Occluded origin of the left vertebral artery, with reconstitution at the left foraminal segment. The ready irregular left vertebral artery foraminal segment to level of C2-3.  No hemodynamically significant stenosis by NASCET criteria. No dissection, no pseudoaneurysm. No abnormal luminal irregularity. No contrast extravasation.  15 mm right thyroid nodule. No acute osseous process though bone windows have not been submitted. Included view of the lungs demonstrates interstitial prominence and ground-glass opacities suggesting pulmonary edema.  Review of the MIP images confirms the above findings.  IMPRESSION: CTA neck: Aberrant right subclavian artery with at least 75% stenosis of the right subclavian artery, and 50% stenosis of the left subclavian artery due to calcific atherosclerosis.  Occluded origin left vertebral artery with irregular intraforaminal segment which may reflect chronic dissection, vessel remains patent. High-grade stenosis of the origin of the right vertebral artery.   Status post apparent right carotid endarterectomy. Approximately 66% narrowing of the left internal carotid artery origin by NASCET criteria.  CTA head: Calcific atherosclerosis of the carotid siphons with at least 50% stenosis.  High-grade stenosis of the right carotid terminus. No large vessel occlusion.   Electronically Signed   By: Elon [name]   On: 07/13/2014 03:44   Portable Chest Xray In Am  07/13/2014   CLINICAL DATA:  Assess airspace disease  EXAM: PORTABLE CHEST - 1 VIEW  COMPARISON:  07/12/2014  FINDINGS: Endotracheal tube ends between the clavicular heads and carina. Orogastric tube is coiled in the stomach. Left upper extremity PICC, tip at the upper cavoatrial junction.  Improved pulmonary inflation. There is persistent bibasilar coarse interstitial opacities. Air bronchograms at the left base. No evidence of effusion or pneumothorax. Calcified chronic pancreatitis.  IMPRESSION: 1. Tubes and  central line are in good position. 2. Improved pulmonary inflation. Residual basilar opacities could reflect edema or infection.   Electronically Signed   By: Jorje Guild M.D.   On: 07/13/2014 06:23   Dg Chest Port 1 View  07/12/2014   CLINICAL DATA:  Intubation  EXAM: PORTABLE CHEST - 1 VIEW  COMPARISON:  07/12/2014 at 5:30 a.m.  FINDINGS: New endotracheal tube tip lies at the upper margin of the carina.  Lung volumes are low. Hazy bilateral lung opacity seen earlier is unchanged. No pneumothorax.  New left PICC has its tip projecting in the region of the caval atrial junction.  IMPRESSION: Endotracheal tube tip lies at the level of the upper carina.  Left PICC tip lies at the caval atrial junction.  No change in lung aeration.   Electronically Signed   By: Lajean Manes M.D.   On: 07/12/2014 14:07   Dg Chest Port 1 View  07/12/2014   CLINICAL DATA:  Shortness of breath.  EXAM: PORTABLE CHEST - 1 VIEW  COMPARISON:  Chest x-ray from yesterday.  FINDINGS: Lower lung volumes which accounts for  diffuse increase in vascular prominence. There may also be background pulmonary edema. Bibasilar lung opacity persists, with air bronchograms. No increasing pleural fluid. No pneumothorax.  Likely no cardiomegaly when considering technical factors.  Chronic pancreatitis with diffuse parenchymal calcifications. This is known per the electronic medical record.  IMPRESSION: 1. Lower lung volumes. 2. Persistent bibasilar opacities which could reflect atelectasis or pneumonia. 3. Probable superimposed pulmonary edema.   Electronically Signed   By: Jorje Guild M.D.   On: 07/12/2014 06:52   Dg Abd Portable 1v  07/13/2014   CLINICAL DATA:  Orogastric tube placement  EXAM: PORTABLE ABDOMEN - 1 VIEW  COMPARISON:  07/07/2014  FINDINGS: The orogastric tube is coiled in the stomach. The tip is in the fundus.  Nonobstructive bowel gas pattern.  Chronic pancreatitis with diffuse parenchymal calcification.  Interstitial opacities in the lower lungs. No evidence of change from yesterday's chest x-ray.  IMPRESSION: 1. Orogastric tube is coiled in the stomach. 2. Nonobstructive bowel gas pattern. 3. Chronic pancreatitis.   Electronically Signed   By: Jorje Guild M.D.   On: 07/13/2014 02:37    Assessment: Although her chest x-ray looks better postintubation suspect that her fever is due to HCAP.   Plan: 1. Continue current antibiotics pending final culture results 2. Holding antiretroviral therapy  Michel Bickers, MD [facility] [phone] pager   [phone] cell 07/13/2014, 9:58 AM

## 2014-07-13 NOTE — Clinical Social Work Note (Signed)
CSW continuing to follow for discharge disposition purposes.  Lubertha Sayres, MSW, [facility] Licensed Clinical Social Worker [phone] and [phone]

## 2014-07-13 NOTE — Progress Notes (Signed)
PULMONARY / CRITICAL CARE MEDICINE   Name: [name] MRN: [id number] DOB: 10-Feb-1956    ADMISSION DATE:  06/15/2014 CONSULTATION DATE:  07/12/2014  CHIEF COMPLAINT: Hypotension, respiratory distress   INITIAL PRESENTATION: 58 yo HIV / HCV + with DM and prior left AKA who initially presented on 7/29 with right foot pain. 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 On 8/4 developed metabolic acidosis. Acute encephalopathy, leukocytosis and CXR suggestive of PNA. She was started on Abx for HCAP starting 8/4. On 8/6, developed worsening acidosis acidosis (ABG 7.09 / 17 / 71), hypotension and respiratory distress required emergent intubation by PCCM and transfer to ICU.   STUDIES:  8/5 Echo >>> EF 65-70%, no RWMA.   SIGNIFICANT EVENTS:  7/29 admitted for right foot pain  8/3 underwent right BKA  8/6 required emergent intubation, transfer to ICU   HISTORY OF PRESENT ILLNESS: Pt is encephalopathic; therefore, this HPI is obtained from chart review.  Mrs. [name] is a 58 y.o. With multiple co-morbidities including but not limited to HIV (CD4 count 1200 on 07/03/2014), HCV, left BKA, and resides in a nursing home. She presented on 7/29 with complaint of right foot pain. She was admitted to [facility] service and vascular surgery was consulted as she was well known to Dr. Donnetta [name] (prior arteriogram in March 2014 demonstrated severe bilateral tibial disease). Due to severe disease, she underwent right sided BKA on 8/3.  On 8/4, she became lethargic, had leukocytosis and CXR with findings suggestive of PNA. She was started on abx for HCAP and transferred to the SDU.  On 8/6, her acidosis significantly worsened and she was in marked respiratory distress with worsening hypotension. PCCM was asked to see her and upon our arrival to room, pt had parodoxical breathing with labile SpO2 (dropped to 50's at one point despite being on NRB).  Due to her worsening hypotension and significant hypoxemia and increased WOB, she required  emergent intubation and transfer to the ICU.  Of note, urine ketones on 8/4 and 8/6 both > 80. Latest CBG 291. Given these results along with profound metabolic acidosis, am highly suspicious for DKA.   PAST MEDICAL HISTORY :  Past Medical History  Diagnosis Date  . Depression   . Neuropathy   . Mood disorder   . TIA (transient ischemic attack)   . Pancreatitis chronic   . Chronic pain   . HIV (human immunodeficiency virus infection) 1990's    takes Norpramin nightly  . Polysubstance abuse   . Cardiomyopathy, nonischemic     EF 45%  . Peripheral vascular disease   . Asthma     "used to when I was young" (02/20/2013)  . Arthritis     "legs and arms" (02/20/2013)  . Hypercholesterolemia     takes Lipitor nightly  . Seasonal allergies     takes Zyrtec daily  . Anemia     takes Iron pill daily  . Nausea     takes Zofran prn  . GERD (gastroesophageal reflux disease)     takes Prilosec daily  . Insomnia     takes Trazodone nightly  . Constipation     takes miralax prn and stool softener daily  . Type II diabetes mellitus     lantus 45units at bedtime  . Dysrhythmia     takes Metoprolol daily  . Weakness of both legs   . Peripheral neuropathy   . Chronic back pain   . Hepatitis C     C  .  Urinary frequency   . Nocturia    Past Surgical History  Procedure Laterality Date  . Carotid endarterectomy Right   . Tubal ligation    . Lower extremity angiogram    . Toe amputation    . Amputation Left 04/28/2013    Procedure: AMPUTATION DIGIT;  Surgeon: Jasmine Posner, MD;  Location: [name];  Service: Vascular;  Laterality: Left;  left 2nd toe amputation  . Amputation Left 05/10/2013    Procedure: AMPUTATION BELOW KNEE;  Surgeon: Jasmine Posner, MD;  Location: [name];  Service: Vascular;  Laterality: Left;  . Cataract extraction w/phaco Right 02/27/2014    Procedure: CATARACT EXTRACTION PHACO AND INTRAOCULAR LENS PLACEMENT (IOC);  Surgeon: Jasmine Guadeloupe T. Gershon Crane, MD;  Location: [name];   Service: Ophthalmology;  Laterality: Right;  CDE:  6.77  . Cataract extraction w/phaco Left 03/20/2014    Procedure: CATARACT EXTRACTION PHACO AND INTRAOCULAR LENS PLACEMENT (IOC);  Surgeon: Jasmine Guadeloupe T. Gershon Crane, MD;  Location: [name];  Service: Ophthalmology;  Laterality: Left;  CDE:6.98  . Amputation Right 07/20/2014    Procedure: RIGHT BELOW THE KNEE AMPUTATION;  Surgeon: Jasmine Posner, MD;  Location: [name];  Service: Vascular;  Laterality: Right;   Prior to Admission medications   Medication Sig Start Date End Date Taking? Authorizing Provider  aspirin EC 81 MG tablet Take 81 mg by mouth daily.   Yes Historical Provider, MD  atorvastatin (LIPITOR) 10 MG tablet Take 10 mg by mouth at bedtime.   Yes Historical Provider, MD  cetirizine (ZYRTEC) 10 MG tablet Take 10 mg by mouth daily.   Yes Historical Provider, MD  docusate sodium (COLACE) 100 MG capsule Take 100 mg by mouth daily.    Yes Historical Provider, MD  dolutegravir (TIVICAY) 50 MG tablet Take 1 tablet (50 mg total) by mouth daily. 01/10/14  Yes Jasmine Hayward, MD  emtricitabine-tenofovir (TRUVADA) 200-300 MG per tablet Take 1 tablet by mouth daily. 01/10/14  Yes Jasmine Hayward, MD  gabapentin (NEURONTIN) 300 MG capsule Take 1 capsule (300 mg total) by mouth 3 (three) times daily. 08/17/13  Yes Jasmine Dutch, MD  HYDROcodone-acetaminophen (NORCO/VICODIN) 5-325 MG per tablet Take 1 tablet by mouth every 12 (twelve) hours as needed for moderate pain.    Yes Historical Provider, MD  insulin aspart (NOVOLOG FLEXPEN) 100 UNIT/ML FlexPen Inject 1-10 Units into the skin 3 (three) times daily with meals. Per sliding scale. 100-130=1 unit; 131-160=2 units; 161-190=3 units; 191-220=4 units; 221-250=5 units; 251-280= 6 units; 281-310= 7 units; 311-340= 8 units; 341-370= 9 units; 371-400= 10 units.   Yes Historical Provider, MD  insulin aspart (NOVOLOG FLEXPEN) 100 UNIT/ML FlexPen Inject 9 Units into the skin 3 (three) times daily with meals. In  addition with sliding scale.   Yes Historical Provider, MD  insulin glargine (LANTUS) 100 UNIT/ML injection Inject 40 Units into the skin at bedtime.    Yes Janell Quiet, MD  lisinopril (PRINIVIL,ZESTRIL) 5 MG tablet Take 5 mg by mouth daily.   Yes Historical Provider, MD  LORazepam (ATIVAN) 0.5 MG tablet Take 0.5 mg by mouth 2 (two) times daily as needed for anxiety (take 1 tablet at bedtime and each morning as needed).    Yes Historical Provider, MD  metoprolol succinate (TOPROL-XL) 50 MG 24 hr tablet Take 50 mg by mouth daily after breakfast. Take with or immediately following a meal.   Yes Historical Provider, MD  Multiple Vitamin (DAILY VITE) TABS Take 1 tablet by mouth  daily.   Yes Historical Provider, MD  Pancrelipase, Lip-Prot-Amyl, (CREON) 6000 UNITS CPEP Take 6,000-12,000 Units by mouth See admin instructions. Take 2 capsules three times a day with meals and take 1 capsule three times a day with snacks   Yes Historical Provider, MD  polyethylene glycol (MIRALAX / GLYCOLAX) packet Take 17 g by mouth daily as needed for mild constipation.   Yes Historical Provider, MD  sertraline (ZOLOFT) 100 MG tablet Take 100 mg by mouth daily.   Yes Historical Provider, MD  hydrOXYzine (VISTARIL) 25 MG capsule Take 25 mg by mouth every 6 (six) hours as needed for itching.    Historical Provider, MD  ondansetron (ZOFRAN) 8 MG tablet Take by mouth every 8 (eight) hours as needed for nausea or vomiting.    Historical Provider, MD   No Known Allergies  FAMILY HISTORY:  Family History  Problem Relation Age of Onset  . Coronary artery disease Father 40   SOCIAL HISTORY:  reports that she has been smoking Cigarettes.  She has a 10 pack-year smoking history. She has never used smokeless tobacco. She reports that she does not drink alcohol or use illicit drugs.  REVIEW OF SYSTEMS: unable to obtain, intubated patient.   SUBJECTIVE: Patient more awake this morning, following some simple commands, still  somnolent. RN stated patient with hypokalemia.  Patient seen also by neuro, concerns for embolic events.  ELINK ordered CT head for right sided weakness and consulted neurology VITAL SIGNS: Temp:  [97.3 F (36.3 C)-101 F (38.3 C)] 101 F (38.3 C) (08/07 0751) Pulse Rate:  [94-138] 112 (08/07 1115) Resp:  [20-39] 26 (08/07 1115) BP: (81-123)/(57-82) 107/69 mmHg (08/07 1100) SpO2:  [89 %-100 %] 99 % (08/07 1115) FiO2 (%):  [40 %-100 %] 40 % (08/07 0806) Weight:  [132 lb 7.9 oz (60.1 kg)] 132 lb 7.9 oz (60.1 kg) (08/07 0500) HEMODYNAMICS:   VENTILATOR SETTINGS: Vent Mode:  [-] PRVC FiO2 (%):  [40 %-100 %] 40 % Set Rate:  [24 bmp-30 bmp] 24 bmp Vt Set:  [500 mL] 500 mL PEEP:  [5 cmH20] 5 cmH20 Plateau Pressure:  [13 cmH20-31 cmH20] 27 cmH20 INTAKE / OUTPUT:  Intake/Output Summary (Last 24 hours) at 07/13/14 1134 Last data filed at 07/13/14 1100  Gross per 24 hour  Intake 4451.95 ml  Output   2800 ml  Net 1651.95 ml    PHYSICAL EXAMINATION: General: Chronically ill appearing female, minimally responsive, in distress.  Neuro: Minimally responsive, withdraws to pain.  HEENT: [place]/AT. PERRL, sclerae anicteric. On NRB.  Cardiovascular: Tachy, regular, no M/R/G.  Lungs: Respirations shallow and paradoxical, accessory muscle use, scattered rhonchi throughout.  Abdomen: BS x 4, soft, NT/ND.  Musculoskeletal: Bilateral BKA's, new right sided BKA dressings C/D/I, no edema.  Skin: Intact, cool, no rashes.   LABS:  CBC  Recent Labs Lab 07/11/14 0330 07/12/14 0407 07/12/14 0842  WBC 25.6* 25.4* 26.9*  HGB 9.7* 9.5* 9.6*  HCT 30.2* 29.5* 29.8*  PLT 253 278 343   Coag's No results found for this basename: APTT, INR,  in the last 168 hours BMET  Recent Labs Lab 07/12/14 1910 07/12/14 2100 07/13/14  NA 158* 142 150*  K <2.2* <2.2* <2.2*  CL 112 101 105  CO2 18* 23 29  BUN 32* 26* 23  CREATININE 1.95* 1.56* 1.69*  GLUCOSE 282* 761* 269*   Electrolytes  Recent  Labs Lab 07/11/14 0330  07/12/14 1107  07/12/14 1910 07/12/14 2100 07/13/14  CALCIUM 8.7  < >  --   < >  7.8* 6.8* 7.3*  MG 2.0  --  2.6*  --   --   --   --   < > = values in this interval not displayed. Sepsis Markers  Recent Labs Lab 07/12/14 1520 07/12/14 2021 07/13/14 0221  LATICACIDVEN 2.7* 2.6* 2.4*   ABG  Recent Labs Lab 07/12/14 0755 07/12/14 1441 07/12/14 2016  PHART 7.091* 7.338* 7.484*  PCO2ART 17.4* 28.8* 32.1*  PO2ART 71.8* 60.0* 64.0*   Liver Enzymes  Recent Labs Lab 07/12/14 1445  ALBUMIN 2.2*   Cardiac Enzymes  Recent Labs Lab 07/10/14 0520 07/12/14 1505 07/12/14 2021 07/13/14 0221  TROPONINI  --  <0.30 0.37* 0.99*  PROBNP 3151.0*  --   --   --    Glucose  Recent Labs Lab 07/12/14 2202 07/12/14 2305 07/12/14 2355 07/13/14 0044 07/13/14 0425 07/13/14 0750  GLUCAP 138* 154* 134* 280* 255* 187*    Imaging Ct Head Wo Contrast  07/13/2014   CLINICAL DATA:  Altered mental status.  EXAM: CT HEAD WITHOUT CONTRAST  TECHNIQUE: Contiguous axial images were obtained from the base of the skull through the vertex without intravenous contrast.  COMPARISON:  06/22/2007  FINDINGS: Skull and Sinuses:Complete opacification of the imaged portions of the left maxillary sinus, new from 2008. There is wall thickening and internal high density. No invasive features surrounding the left maxillary sinus.  No acute fracture destructive process.  Orbits: No acute abnormality.  Brain: No evidence of acute abnormality, such as acute infarction, hemorrhage, hydrocephalus, or mass lesion/mass effect. Remote appearing small vessel infarct in the mid left cerebellum. Chronic vessel disease with pattern similar to 2008. Ischemic gliosis is most notable in the internal capsule and corona radiata on the left.  IMPRESSION: 1. No acute intracranial findings. 2. Chronic ischemic injuries described above. 3. Chronically obstructed left maxillary sinus.   Electronically Signed   By:  Jorje Guild M.D.   On: 07/13/2014 00:41   Dg Chest Port 1 View  07/12/2014   CLINICAL DATA:  Intubation  EXAM: PORTABLE CHEST - 1 VIEW  COMPARISON:  07/12/2014 at 5:30 a.m.  FINDINGS: New endotracheal tube tip lies at the upper margin of the carina.  Lung volumes are low. Hazy bilateral lung opacity seen earlier is unchanged. No pneumothorax.  New left PICC has its tip projecting in the region of the caval atrial junction.  IMPRESSION: Endotracheal tube tip lies at the level of the upper carina.  Left PICC tip lies at the caval atrial junction.  No change in lung aeration.   Electronically Signed   By: Lajean Manes M.D.   On: 07/12/2014 14:07   Dg Chest Port 1 View  07/12/2014   CLINICAL DATA:  Shortness of breath.  EXAM: PORTABLE CHEST - 1 VIEW  COMPARISON:  Chest x-ray from yesterday.  FINDINGS: Lower lung volumes which accounts for diffuse increase in vascular prominence. There may also be background pulmonary edema. Bibasilar lung opacity persists, with air bronchograms. No increasing pleural fluid. No pneumothorax.  Likely no cardiomegaly when considering technical factors.  Chronic pancreatitis with diffuse parenchymal calcifications. This is known per the electronic medical record.  IMPRESSION: 1. Lower lung volumes. 2. Persistent bibasilar opacities which could reflect atelectasis or pneumonia. 3. Probable superimposed pulmonary edema.   Electronically Signed   By: Jorje Guild M.D.   On: 07/12/2014 06:52     ASSESSMENT / PLAN:  A:  Acute hypoxemic respiratory failure secondary to pneumonia and severe aacidosis, intubated 8/6  HCAP vs aspiration  pneumonia in setting of encephalopathy ? CVA  P:  Goal pH > 7.28, SpO2 > 92%.  Full mechanical support, high Ve >> attempting wean today, will try to switch PSV  VAP bundle.  Defer SBT for now  Trend ABG / CXR  Albuterol PRN  Ct   CARDIOVASCULAR  A:  Hypotension in setting of severe metabolic acidosis and hypovolemia  Sepsis, lactate  reassuring  Chronic systolic heart failure, preserved systolic function EF 16% 8/6  R BKA 8/4  P:  Goal MAP > 65.  Trend troponin / lactate  D\C levophed gtt (due to high HR), will switch neosyphrine Precedex gtt Vascular following for R BKA  RENAL  A:  AG metabolic acidosis, likely secondary to DKA ( urine ketones positive 8/4 )  Lactate is low and out of proportion with degree of acidosis  Toxic ingestion is unlikely in inpatient setting  Are HAART medications contributing?  AKI  Hypernatremia  Hypokalemia  Hypovolemia  P:  Initially on Bicarb gtt, this was stopped on 8/7am, repeat labs with AG~20, will restart insulin and D5W with K (44meq) Hold outpatient Lisinopril.  HAART regiment on hold - ID following Recheck BMP this afternoon BMP q4hr  GASTROINTESTINAL  A:  Nutrition  GIPx  P:  NPO.  Protonix   HEMATOLOGIC  A:  VTE Prophylaxis  P:  SCD / Heparin [place]  Trend CBC   INFECTIOUS  A:  HIV  HCV   Possible HCAP  P:  ID follwing  Blood cx 8/4 >>>  Sputum cx 8/6 >>>  Vancomycin 8/4 >>>  Zosyn 8/4 >>>  Holding HAART   ENDOCRINE  A:  DM 2  Likely DKA without significant hyperglycemia  At risk for adrenal insufficiency ( adrenalitis of HIV?)  P:  Insulin gtt  D5W with 71meq KCl @100  allow for significant Insulin rates without corresponding hypoglycemia  BMP q4hrs  Follow ketones and Anion Gap.  Cortisol level   NEUROLOGIC  A:  Acute metabolic encephalopathy  P:  Neurology following - appreciate recs -- MRI, TTE (when stable), low threshold for LP if s\s of encephalitis\meningitis.  High risk patient, HIV + on HAART RASS goal 0 to -1  Fentanyl PRN   I have personally obtained a history, examined the patient, evaluated laboratory and imaging results, formulated the assessment and plan and placed orders. CRITICAL CARE: The patient is critically ill with multiple organ systems failure and requires high complexity decision making for assessment and  support, frequent evaluation and titration of therapies, application of advanced monitoring technologies and extensive interpretation of multiple databases. Critical Care Time devoted to patient care services described in this note is 45 minutes.   Vilinda Boehringer, MD [facility] Pager [phone] On Call Pager [phone]   07/13/2014, 11:34 AM

## 2014-07-13 NOTE — Progress Notes (Signed)
[facility] Progress Note Patient Name: [name] DOB: 23-Feb-1956 MRN: [id number]  Date of Service  07/13/2014   HPI/Events of Note   Head CT looks OK to me  eICU Interventions  Call neurology consult for weakness   Intervention Category Intermediate Interventions: Diagnostic test evaluation  Avyanna Spada 07/13/2014, 12:22 AM

## 2014-07-13 NOTE — Progress Notes (Signed)
NUTRITION FOLLOW UP  Intervention:    When insulin drip is off and able to start TF, utilize 33M PEPuP Protocol: initiate TF via OGT with Vital AF 1.2 at 25 ml/h and Prostat 30 ml BID on day 1; on day 2, discontinue Prostat and increase feedings to goal rate of 60 ml/h (1440 ml per day) to provide 1728 kcals, 108 gm protein, 1168 ml free water daily.  New Nutrition Dx:   Inadequate oral intake related to inability to eat as evidenced by NPO status. Ongoing.  Goal:   Intake to meet >90% of estimated nutrition needs. Unmet.  Monitor:   TF initiation/tolerance/adequacy, weight trend, labs, vent status.  Assessment:   58 yo female with PMH of left BKA, HIV, HTN, DM; admitted on 7/29 for right foot pain.   S/P right BKA on 8/3. NPO since procedure with some difficulty swallowing. SLP was following until intubation on 8/6. Potassium is low. Magnesium low at last check. Both being replaced. Phosphorus level being checked today. Discussed patient in ICU rounds today. Insulin drip had to be resumed today, therefore, will not start TF today.  Patient is currently intubated on ventilator support MV: 14.1 L/min Temp (24hrs), Avg:99.6 F (37.6 C), Min:97.3 F (36.3 C), Max:101 F (38.3 C)  Propofol: none  Height: Ht Readings from Last 1 Encounters:  07/10/14 5\' 4"  (1.626 m)    Weight Status:  Wt Readings from Last 1 Encounters:  07/13/14 132 lb 7.9 oz (60.1 kg)  07/11/14  162 lb 4.1 oz (73.6 kg)  06/23/2014 130 lb (58.968 kg)  Ideal weight (post bilateral leg amputations): 48.1 kg  Re-estimated needs:  Kcal: 1740 Protein: 85-100 gm Fluid: 1.7 L  Skin: right leg BKA site  Diet Order: NPO   Intake/Output Summary (Last 24 hours) at 07/13/14 1019 Last data filed at 07/13/14 0900  Gross per 24 hour  Intake 3966.01 ml  Output   2400 ml  Net 1566.01 ml    Last BM: 8/6   Labs:   Recent Labs Lab 07/11/14 0330  07/12/14 1107  07/12/14 1910 07/12/14 2100 07/13/14  NA 148*   < >  --   < > 158* 142 150*  K 3.3*  < >  --   < > <2.2* <2.2* <2.2*  CL 112  < >  --   < > 112 101 105  CO2 14*  < >  --   < > 18* 23 29  BUN 24*  < >  --   < > 32* 26* 23  CREATININE 1.41*  < >  --   < > 1.95* 1.56* 1.69*  CALCIUM 8.7  < >  --   < > 7.8* 6.8* 7.3*  MG 2.0  --  2.6*  --   --   --   --   GLUCOSE 154*  < >  --   < > 282* 761* 269*  < > = values in this interval not displayed.  CBG (last 3)   Recent Labs  07/12/14 2355 07/13/14 0044 07/13/14 0425  GLUCAP 134* 280* 255*    Scheduled Meds: . antiseptic oral rinse  7 mL Mouth Rinse QID  . aspirin EC  81 mg Oral Daily  . chlorhexidine  15 mL Mouth Rinse BID  . heparin  5,000 Units Subcutaneous 3 times per day  . multivitamin with minerals  1 tablet Per Tube Daily  . pantoprazole (PROTONIX) IV  40 mg Intravenous Daily  . piperacillin-tazobactam (ZOSYN)  IV  3.375 g Intravenous Q8H  . sodium chloride  10-40 mL Intracatheter Q12H  . sodium chloride  3 mL Intravenous Q12H  . vancomycin  750 mg Intravenous Q24H    Continuous Infusions: . sodium chloride 100 mL/hr at 07/13/14 0900  . norepinephrine (LEVOPHED) Adult infusion 10.027 mcg/min (07/13/14 0900)    Molli Barrows, RD, LDN, [place] Pager [phone] After Hours Pager [phone]

## 2014-07-13 NOTE — Progress Notes (Signed)
Stroke Team Progress Note   Admit Date: 07/03/2014  LOS: 9 days   HISTORY [name] is an 58 y.o. female with a history of HIV infection, nonischemic cardiomyopathy, peripheral vascular disease, hypercholesterolemia and diabetes mellitus who is 3 days postop right BK amputation, noted to not be moving her right side. She is currently intubated and on mechanical ventilation, but on no sedating medications. She was transferred to the intensive care unit following onset of hypotension, respiratory distress and worsening of metabolic acidosis at around 1 PM on 07/12/2014. She was last seen moving the right arm and hand at around 8 PM tonight. Stat CT scan of her head showed no acute intracranial abnormality. Patient has been on aspirin 81 mg per day. She had a low-grade temp of 100.5. She's currently on Zosyn and vancomycin.  SUBJECTIVE  no family member is at the bedside during round.  Her condition is still very critical. Her BP is low at 80s and currently on levophed drip. Able to open eyes on voice but not follow any commands. Left arm spontaneously moving but not right arm or leg.  OBJECTIVE Most recent Vital Signs: Filed Vitals:   07/13/14 0815 07/13/14 0830 07/13/14 0845 07/13/14 0900  BP: 84/60 89/61 83/57  92/70  Pulse: 104 108 108 116  Temp:      TempSrc:      Resp: 24 22 24 24   Height:      Weight:      SpO2: 93% 93% 95% 96%   CBG (last 3)   Recent Labs  07/12/14 2355 07/13/14 0044 07/13/14 0425  GLUCAP 134* 280* 255*     IV Fluid Intake:   . sodium chloride 100 mL/hr at 07/13/14 0900  . norepinephrine (LEVOPHED) Adult infusion 10.027 mcg/min (07/13/14 0900)    Medications: Scheduled:  . antiseptic oral rinse  7 mL Mouth Rinse QID  . aspirin EC  81 mg Oral Daily  . chlorhexidine  15 mL Mouth Rinse BID  . heparin  5,000 Units Subcutaneous 3 times per day  . multivitamin with minerals  1 tablet Per Tube Daily  . pantoprazole (PROTONIX) IV  40 mg Intravenous Daily   . piperacillin-tazobactam (ZOSYN)  IV  3.375 g Intravenous Q8H  . sodium chloride  10-40 mL Intracatheter Q12H  . sodium chloride  3 mL Intravenous Q12H  . vancomycin  750 mg Intravenous Q24H   Continuous Infusions:  . sodium chloride 100 mL/hr at 07/13/14 0900  . norepinephrine (LEVOPHED) Adult infusion 10.027 mcg/min (07/13/14 0900)   PRN: @MEDSPRNSH @  Diet:  NPO  Activity:  Bedrest DVT Prophylaxis:  Heparin subq  CLINICALLY SIGNIFICANT STUDIES Basic Metabolic Panel:  Recent Labs Lab 07/11/14 0330  07/12/14 1107  07/12/14 2100 07/13/14  NA 148*  < >  --   < > 142 150*  K 3.3*  < >  --   < > <2.2* <2.2*  CL 112  < >  --   < > 101 105  CO2 14*  < >  --   < > 23 29  GLUCOSE 154*  < >  --   < > 761* 269*  BUN 24*  < >  --   < > 26* 23  CREATININE 1.41*  < >  --   < > 1.56* 1.69*  CALCIUM 8.7  < >  --   < > 6.8* 7.3*  MG 2.0  --  2.6*  --   --   --   < > =  values in this interval not displayed. Liver Function Tests:  Recent Labs Lab 07/12/14 1445  ALBUMIN 2.2*   CBC:  Recent Labs Lab 07/12/14 0407 07/12/14 0842  WBC 25.4* 26.9*  NEUTROABS  --  24.5*  HGB 9.5* 9.6*  HCT 29.5* 29.8*  MCV 89.9 93.4  PLT 278 343   Coagulation: No results found for this basename: LABPROT, INR,  in the last 168 hours Cardiac Enzymes:  Recent Labs Lab 07/12/14 1505 07/12/14 2021 07/13/14 0221  TROPONINI <0.30 0.37* 0.99*   Urinalysis:  Recent Labs Lab 07/10/14 1358 07/12/14 1401  COLORURINE YELLOW YELLOW  LABSPEC 1.015 1.012  PHURINE 6.0 6.5  GLUCOSEU 500* 500*  HGBUR LARGE* LARGE*  BILIRUBINUR NEGATIVE NEGATIVE  KETONESUR >80* >80*  PROTEINUR 100* NEGATIVE  UROBILINOGEN 0.2 0.2  NITRITE NEGATIVE NEGATIVE  LEUKOCYTESUR NEGATIVE NEGATIVE   Lipid Panel    Component Value Date/Time   CHOL 125 11/21/2013 1506   TRIG 169* 11/21/2013 1506   HDL 56 11/21/2013 1506   CHOLHDL 2.2 11/21/2013 1506   VLDL 34 11/21/2013 1506   LDLCALC 35 11/21/2013 1506   HgbA1C  .resu Lab Results  Component Value Date   HGBA1C 6.2* 05/09/2013   TSH  Lab Results  Component Value Date   TSH 1.199 04/18/2014   Urine Drug Screen:     Component Value Date/Time   LABOPIA POSITIVE* 02/21/2013 0151   COCAINSCRNUR NONE DETECTED 02/21/2013 0151   LABBENZ NONE DETECTED 02/21/2013 0151   AMPHETMU NONE DETECTED 02/21/2013 0151   THCU NONE DETECTED 02/21/2013 0151   LABBARB NONE DETECTED 02/21/2013 0151    Alcohol Level: No results found for this basename: ETH,  in the last 168 hours  I personally reviewed the image studies below and agree with the interpretation.  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  Occluded origin left vertebral artery with irregular intraforaminal segment which may reflect chronic dissection, vessel remains patent. High-grade stenosis of the origin of the right vertebral artery.  Status post apparent right carotid endarterectomy. Approximately 66% narrowing of the left internal carotid artery origin by NASCET criteria.  CTA head: Calcific atherosclerosis of the carotid siphons with at least 50% stenosis.  High-grade stenosis of the right carotid terminus. No large vessel occlusion.     Ct Head Wo Contrast  07/13/2014    IMPRESSION: 1. No acute intracranial findings. 2. Chronic ischemic injuries described above. 3. Chronically obstructed left maxillary sinus.      Chest Xray  07/13/2014    IMPRESSION: 1. Tubes and central line are in good position. 2. Improved pulmonary inflation. Residual basilar opacities could reflect edema or infection.     07/12/2014   IMPRESSION: Endotracheal tube tip lies at the level of the upper carina.  Left PICC tip lies at the caval atrial junction.  No change in lung aeration.   Dg Chest Port 1 View  07/12/2014   IMPRESSION: 1. Lower lung  volumes. 2. Persistent bibasilar opacities which could reflect atelectasis or pneumonia. 3. Probable superimposed pulmonary edema.     Dg Abd   07/13/2014  IMPRESSION: 1. Orogastric tube is coiled in the stomach. 2. Nonobstructive bowel gas pattern. 3. Chronic pancreatitis.    MRI of the brain  pending  2D Echocardiogram  07/12/14 - Left ventricle: The cavity size was normal. Systolic  function was vigorous. The estimated ejection fraction was in the range of 65% to 70%. Wall motion was normal; there were no regional wall motion abnormalities. Doppler parameters are consistent with abnormal left ventricular relaxation (grade 1 diastolic dysfunction).  EKG  06/21/2014 sinus brady. For complete results please see formal report.   PT/OT Recommendations pending  Physical Exam Temp:  [97.3 F (36.3 C)-101 F (38.3 C)] 101 F (38.3 C) (08/07 0751) Pulse Rate:  [94-138] 116 (08/07 0900) Resp:  [20-39] 24 (08/07 0900) BP: (81-123)/(57-82) 92/70 mmHg (08/07 0900) SpO2:  [87 %-100 %] 96 % (08/07 0900) FiO2 (%):  [40 %-100 %] 40 % (08/07 0806) Weight:  [132 lb 7.9 oz (60.1 kg)] 132 lb 7.9 oz (60.1 kg) (08/07 0500)  General - thin, well developed, intubated not on sedation, open eyes on loud voice but not following commands.  Ophthalmologic - not cooperative on exam.  Cardiovascular - Regular rhythm with tachycardia.  Lung - decreased breath sound bilaterally  Extremities - bilateral BK amputation  Neuro- drowsy, open eyes on loud voice, not following commands, intubated without sedation, blinking to visual threat bilaterally, pupil 52mm bilaterally, reactive to light, spontaneously eye movement, but more middle position preference, corneal present, cough and gag present, left UE spontaneously movement, LLE against gravity on pain stimulation, RUE and RLE minimal withdraw on pain stimulation, no hoffman sign, reflex UEs 1+.  ASSESSMENT [name] is a 58 y.o. female with PMH of HIV,  nonischemic cardiomyopathy, PVD, HLD and DM with previous left BK amputation admitted for right BK amputation. Pt condition deteriorated yesterday due to hypotension and respiratory distress which required intubation and transfer to ICU. She was found to not moving right UE and LE which triggered for neurology consult. By reviewing her record, it seemed that she had CUS 10/2013 showed right s/p CEA but left ICA 60-79% stenosis. Her CTA overnight also confirmed left ICA 65-70% stenosis with diffuse multivessel severe asthrosclerotic changes. 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 She had multiple stroke risk factors:  1. Hypotension - her BP overnight was low and even this morning still at 80s with levophed. With the tight stenosis on the left ICA, hypotension could impair the left hemisphere brain perfusion which leads to watershed infarct. MRI brain may give a further hint on the distribution of infarct and BP management is the key. Please avoid hypotension.  2. Artery to artery emboli - pt has tight left ICA stenosis and diffuse multivessel severe asthrosclerotic changes. A-A emboli is possible from left ICA. MRI brain recommended to see if stroke is within a large vessel territory. Future CEA may be considered.  3. cardioembolic - pt has history of HIV, with fever, although 07/10/14 blood culture so far NGTD, but endocarditis not able to rule out. Recommend to repeat blood culture, and consider TEE once pt stable. ID is on board.  4. Paradoxical emboli - pt has right BK amputation 3 days ago, LE DVT needs to be ruled out. If pt also has PFO, then paradoxical emboli is in the differential. Pt has respiratory distress, may also need to rule out PE. Recommend UE and LE U/S to rule out DVT.   5. Small vessel event - pt has stroke risk factors such as DM, HLD, HIV with diffuse asthrosclerotic changes, thrombotic events are certainly possible. MRI brain and risk factor  modification.   6. Beside consideration of stroke, she has hx of HIV with mental status changes  and fever, we have low threshold to have LP to rule out CNS infection.   7. Subclinical seizure - will recommend EEG to rule out.  TREATMENT/PLAN  Recommend MRI brain without contrast once stable  EEG (ordered)  UE and LE venous doppler to rule out DVT  Low threshold to consider LP to rule out CNS infection  Please consider TEE once stable  BP management, please avoid hypotension, goal SBP 110-160  Consider change ASA 81 mg to 325mg  if not contraindicated  Please check A1C and lipid panel.  Continue antibiotics as per ID  GI and DVT prophylaxis  Will follow   SIGNED This patient is critically ill due to AMS and respiratory failure with likely left hemisphere stroke and at significant risk of neurological worsening, death form respiratory failure, metabolic acidosis and recurrent malignant strokes. This patient's care requires constant monitoring of vital signs, hemodynamics, respiratory and cardiac monitoring, review of multiple databases, neurological assessment, discussion with family, other specialists and medical decision making of high complexity. I spent 55 minutes of neurocritical care time in the care of this patient.  Rosalin Hawking, MD PhD Stroke Neurology 07/13/2014 10:38 AM   To contact Stroke Continuity provider, please refer to [email or web address] After hours, contact General Neurology

## 2014-07-13 NOTE — Progress Notes (Signed)
EEG completed; results pending.    

## 2014-07-13 NOTE — Progress Notes (Signed)
PT Cancellation Note  Patient Details Name: HIYAB NHEM MRN: [id number] DOB: 1956-02-13   Cancelled Treatment:    Reason Eval/Treat Not Completed: Medical issues which prohibited therapy (Pt intubated emergently after amputation surgery.)PT discontinued by MD.  Will sign off.  Thanks.   INGOLD,Shanvi Moyd 07/13/2014, 9:33 AM  [facility] [phone] (pager)

## 2014-07-13 NOTE — Procedures (Addendum)
History: 58 yo F with right sided weakness and AMS  Sedation: precedex  Technique: This is a 17 channel routine scalp EEG performed at the bedside with bipolar and monopolar montages arranged in accordance to the international 10/20 system of electrode placement. One channel was dedicated to EKG recording.    Background: The background consists of irregular delta activity with occasional triphasic waves. There is some theta activity intermixed with the delta activity as well, which is also regular. At times, there are some structures resembling sleep structures.  Photic stimulation: Physiologic driving is not performed  EEG Abnormalities: 1) triphasic waves 2) generalized irregular delta activity  Clinical Interpretation: This EEG is consistent with a moderate nonspecific generalized cerebral dysfunction. There was no seizure or seizure predisposition recorded on this study.   Roland Rack, MD [facility] [phone]  If 7pm- 7am, please page neurology on call as listed in [place].

## 2014-07-14 DIAGNOSIS — R197 Diarrhea, unspecified: Secondary | ICD-10-CM

## 2014-07-14 LAB — TROPONIN I

## 2014-07-14 LAB — CBC
HEMATOCRIT: 25.1 % — AB (ref 36.0–46.0)
Hemoglobin: 8.3 g/dL — ABNORMAL LOW (ref 12.0–15.0)
MCH: 29.7 pg (ref 26.0–34.0)
MCHC: 33.1 g/dL (ref 30.0–36.0)
MCV: 90 fL (ref 78.0–100.0)
Platelets: 199 10*3/uL (ref 150–400)
RBC: 2.79 MIL/uL — ABNORMAL LOW (ref 3.87–5.11)
RDW: 17.7 % — AB (ref 11.5–15.5)
WBC: 20.4 10*3/uL — ABNORMAL HIGH (ref 4.0–10.5)

## 2014-07-14 LAB — GLUCOSE, CAPILLARY
GLUCOSE-CAPILLARY: 106 mg/dL — AB (ref 70–99)
GLUCOSE-CAPILLARY: 126 mg/dL — AB (ref 70–99)
GLUCOSE-CAPILLARY: 137 mg/dL — AB (ref 70–99)
GLUCOSE-CAPILLARY: 152 mg/dL — AB (ref 70–99)
GLUCOSE-CAPILLARY: 192 mg/dL — AB (ref 70–99)
GLUCOSE-CAPILLARY: 210 mg/dL — AB (ref 70–99)
GLUCOSE-CAPILLARY: 265 mg/dL — AB (ref 70–99)
GLUCOSE-CAPILLARY: 94 mg/dL (ref 70–99)
Glucose-Capillary: 218 mg/dL — ABNORMAL HIGH (ref 70–99)
Glucose-Capillary: 115 mg/dL — ABNORMAL HIGH (ref 70–99)
Glucose-Capillary: 118 mg/dL — ABNORMAL HIGH (ref 70–99)
Glucose-Capillary: 147 mg/dL — ABNORMAL HIGH (ref 70–99)
Glucose-Capillary: 158 mg/dL — ABNORMAL HIGH (ref 70–99)
Glucose-Capillary: 167 mg/dL — ABNORMAL HIGH (ref 70–99)
Glucose-Capillary: 97 mg/dL (ref 70–99)
Glucose-Capillary: 287 mg/dL — ABNORMAL HIGH (ref 70–99)
Glucose-Capillary: 126 mg/dL — ABNORMAL HIGH (ref 70–99)

## 2014-07-14 LAB — COMPREHENSIVE METABOLIC PANEL
ALBUMIN: 1.8 g/dL — AB (ref 3.5–5.2)
ALT: 18 U/L (ref 0–35)
ANION GAP: 10 (ref 5–15)
AST: 40 U/L — ABNORMAL HIGH (ref 0–37)
Alkaline Phosphatase: 122 U/L — ABNORMAL HIGH (ref 39–117)
BUN: 11 mg/dL (ref 6–23)
CO2: 19 mEq/L (ref 19–32)
CREATININE: 1.29 mg/dL — AB (ref 0.50–1.10)
Calcium: 7 mg/dL — ABNORMAL LOW (ref 8.4–10.5)
Chloride: 109 mEq/L (ref 96–112)
GFR calc Af Amer: 51 mL/min — ABNORMAL LOW (ref >90)
GFR calc non Af Amer: 44 mL/min — ABNORMAL LOW (ref >90)
Glucose, Bld: 465 mg/dL — ABNORMAL HIGH (ref 70–99)
POTASSIUM: 5 meq/L (ref 3.7–5.3)
Sodium: 138 mEq/L (ref 137–147)
Total Bilirubin: 0.5 mg/dL (ref 0.3–1.2)
Total Protein: 5.7 g/dL — ABNORMAL LOW (ref 6.0–8.3)

## 2014-07-14 LAB — LIPID PANEL
CHOLESTEROL: 83 mg/dL (ref 0–200)
HDL: 13 mg/dL — ABNORMAL LOW (ref >39)
LDL Cholesterol: 39 mg/dL (ref 0–99)
TRIGLYCERIDES: 156 mg/dL — AB (ref <150)
Total CHOL/HDL Ratio: 6.4 RATIO
VLDL: 31 mg/dL (ref 0–40)

## 2014-07-14 LAB — HEMOGLOBIN A1C
HEMOGLOBIN A1C: 6.6 % — AB (ref <5.7)
Mean Plasma Glucose: 143 mg/dL — ABNORMAL HIGH (ref <117)

## 2014-07-14 LAB — KETONES, QUALITATIVE

## 2014-07-14 LAB — KETONES, URINE: Ketones, ur: NEGATIVE mg/dL

## 2014-07-14 LAB — CLOSTRIDIUM DIFFICILE BY PCR: CDIFFPCR: NEGATIVE

## 2014-07-14 MED ORDER — GLUCERNA 1.2 CAL PO LIQD
1000.0000 mL | ORAL | Status: DC
  Administered 2014-07-14 – 2014-07-17 (×2): 1000 mL
  Filled 2014-07-14 (×7): qty 1000

## 2014-07-14 MED ORDER — VANCOMYCIN HCL IN DEXTROSE 1-5 GM/200ML-% IV SOLN
1000.0000 mg | INTRAVENOUS | Status: DC
  Administered 2014-07-15 – 2014-07-18 (×4): 1000 mg via INTRAVENOUS
  Filled 2014-07-14 (×5): qty 200

## 2014-07-14 MED ORDER — GLUCERNA 1.2 CAL PO LIQD
1000.0000 mL | ORAL | Status: DC
  Filled 2014-07-14: qty 1000

## 2014-07-14 MED ORDER — INSULIN ASPART 100 UNIT/ML ~~LOC~~ SOLN
0.0000 [IU] | SUBCUTANEOUS | Status: DC
  Administered 2014-07-14: 8 [IU] via SUBCUTANEOUS
  Administered 2014-07-14: 3 [IU] via SUBCUTANEOUS
  Administered 2014-07-14 (×2): 2 [IU] via SUBCUTANEOUS
  Administered 2014-07-15: 5 [IU] via SUBCUTANEOUS
  Administered 2014-07-15 (×2): 3 [IU] via SUBCUTANEOUS
  Administered 2014-07-15: 5 [IU] via SUBCUTANEOUS
  Administered 2014-07-15: 3 [IU] via SUBCUTANEOUS
  Administered 2014-07-16: 5 [IU] via SUBCUTANEOUS
  Administered 2014-07-16 (×2): 3 [IU] via SUBCUTANEOUS
  Administered 2014-07-16: 5 [IU] via SUBCUTANEOUS
  Administered 2014-07-16 – 2014-07-17 (×6): 3 [IU] via SUBCUTANEOUS
  Administered 2014-07-17 (×2): 5 [IU] via SUBCUTANEOUS
  Administered 2014-07-18: 11 [IU] via SUBCUTANEOUS
  Administered 2014-07-18: 5 [IU] via SUBCUTANEOUS
  Administered 2014-07-18: 8 [IU] via SUBCUTANEOUS
  Administered 2014-07-18: 15 [IU] via SUBCUTANEOUS
  Administered 2014-07-18: 8 [IU] via SUBCUTANEOUS
  Administered 2014-07-18 – 2014-07-19 (×2): 15 [IU] via SUBCUTANEOUS
  Administered 2014-07-19: 11 [IU] via SUBCUTANEOUS

## 2014-07-14 MED ORDER — INSULIN GLARGINE 100 UNIT/ML ~~LOC~~ SOLN
10.0000 [IU] | Freq: Every day | SUBCUTANEOUS | Status: DC
  Administered 2014-07-14 – 2014-07-15 (×3): 10 [IU] via SUBCUTANEOUS
  Filled 2014-07-14 (×4): qty 0.1

## 2014-07-14 MED ORDER — SODIUM CHLORIDE 0.9 % IV BOLUS (SEPSIS)
500.0000 mL | Freq: Once | INTRAVENOUS | Status: AC
  Administered 2014-07-14: 500 mL via INTRAVENOUS

## 2014-07-14 MED ORDER — LAMIVUDINE-ZIDOVUDINE 150-300 MG PO TABS
1.0000 | ORAL_TABLET | Freq: Two times a day (BID) | ORAL | Status: DC
  Administered 2014-07-14 – 2014-07-15 (×3): 1 via ORAL
  Filled 2014-07-14 (×4): qty 1

## 2014-07-14 MED ORDER — DOLUTEGRAVIR SODIUM 50 MG PO TABS
50.0000 mg | ORAL_TABLET | Freq: Every day | ORAL | Status: DC
  Administered 2014-07-14 – 2014-07-17 (×4): 50 mg via ORAL
  Filled 2014-07-14 (×5): qty 1

## 2014-07-14 MED ORDER — PANTOPRAZOLE SODIUM 40 MG PO PACK
40.0000 mg | PACK | ORAL | Status: DC
  Administered 2014-07-15 – 2014-07-17 (×3): 40 mg
  Filled 2014-07-14 (×4): qty 20

## 2014-07-14 NOTE — Progress Notes (Signed)
NUTRITION FOLLOW UP  Intervention:   Utilize 22M PEPuP Protocol: initiate TF via OGT with Glucerna 1.2 at 25 ml/h and Prostat 30 ml BID on day 1; on day 2, to goal rate of 60 ml/h (1440 ml per day) to provide 1728 kcals, 86 gm protein, 1159 ml free water daily.   Nutrition Dx:   Inadequate oral intake related to inability to eat as evidenced by NPO status. Ongoing.  Goal:   Patient will meet >/=90% of estimated nutrition needs  Monitor:   TF initiation/tolerance/adequacy, weight trend, labs, vent status.  Assessment:   58 yo female with PMH of left BKA, HIV, HTN, DM; admitted on 7/29 for right foot pain.   S/P right BKA on 8/3. NPO since procedure with some difficulty swallowing. SLP was following until intubation on 8/6. Low potassium resolved. Magnesium and phosphorus levels low. Worsening acidosis 8/6 secondary to suspected DKA.   Patient currently  intubated on ventilator support MV: 15.6 L/min Temp (24hrs), Avg:101 F (38.3 C), Min:100.1 F (37.8 C), Max:101.9 F (38.8 C) Propofol: None  TF to be restarted today. Current order Glucerna 1.2 at 10 ml/hr. Per RN, has not started, waiting for RD recommendations.    Height: Ht Readings from Last 1 Encounters:  07/10/14 5\' 4"  (1.626 m)    Weight Status:   Wt Readings from Last 1 Encounters:  07/14/14 135 lb 12.9 oz (61.6 kg)    Re-estimated needs:  Kcal: 1682 kcal Protein: 85-100 g Fluid: 1.7 L/day  Skin: right leg BKA  Diet Order: NPO   Intake/Output Summary (Last 24 hours) at 07/14/14 1103 Last data filed at 07/14/14 0700  Gross per 24 hour  Intake 2129.89 ml  Output   2820 ml  Net -690.11 ml    Last BM: 8/6   Labs:   Recent Labs Lab 07/11/14 0330  07/12/14 1107  07/13/14 0835 07/13/14 1400 07/13/14 1800 07/14/14 0137  NA 148*  < >  --   < > 156* 155* 155* 138  K 3.3*  < >  --   < > 2.3* 2.9* 3.1* 5.0  CL 112  < >  --   < > 114* 116* 118* 109  CO2 14*  < >  --   < > 24 21 24 19   BUN 24*  <  >  --   < > 19 18 14 11   CREATININE 1.41*  < >  --   < > 1.61* 1.69* 1.53* 1.29*  CALCIUM 8.7  < >  --   < > 7.5* 7.4* 7.6* 7.0*  MG 2.0  --  2.6*  --  1.9  --   --   --   PHOS  --   --   --   --  <0.5*  --   --   --   GLUCOSE 154*  < >  --   < > 172* 247* 126* 465*  < > = values in this interval not displayed.  CBG (last 3)   Recent Labs  07/14/14 0141 07/14/14 0339 07/14/14 0751  GLUCAP 126* 192* 97    Scheduled Meds: . antiseptic oral rinse  7 mL Mouth Rinse QID  . aspirin EC  81 mg Oral Daily  . chlorhexidine  15 mL Mouth Rinse BID  . heparin  5,000 Units Subcutaneous 3 times per day  . insulin aspart  0-15 Units Subcutaneous 6 times per day  . insulin glargine  10 Units Subcutaneous QHS  . multivitamin  with minerals  1 tablet Per Tube Daily  . pantoprazole (PROTONIX) IV  40 mg Intravenous Daily  . piperacillin-tazobactam (ZOSYN)  IV  3.375 g Intravenous Q8H  . sodium chloride  10-40 mL Intracatheter Q12H  . vancomycin  750 mg Intravenous Q24H    Continuous Infusions: . dexmedetomidine 0.4 mcg/kg/hr (07/14/14 1036)  . feeding supplement (GLUCERNA 1.2 CAL)    . phenylephrine (NEO-SYNEPHRINE) Adult infusion 100 mcg/min (07/14/14 0200)    Larey Seat, RD, LDN Pager #: [phone] After-Hours Pager #: [phone]

## 2014-07-14 NOTE — Progress Notes (Signed)
PULMONARY / CRITICAL CARE MEDICINE   Name: [name] MRN: [id number] DOB: 07-16-1956    ADMISSION DATE:  06/25/2014 CONSULTATION DATE:  07/12/2014  CHIEF COMPLAINT: Hypotension, respiratory distress   INITIAL PRESENTATION: 58 yo HIV / HCV + with DM and prior left AKA who initially presented on 7/29 with right foot pain. 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 On 8/4 developed metabolic acidosis. Acute encephalopathy, leukocytosis and CXR suggestive of PNA. She was started on Abx for HCAP starting 8/4. On 8/6, developed worsening acidosis acidosis (ABG 7.09 / 17 / 71), hypotension and respiratory distress required emergent intubation by PCCM and transfer to ICU.   STUDIES:  8/5 Echo >>> EF 65-70%, no RWMA.   SIGNIFICANT EVENTS:  7/29 admitted for right foot pain  8/3 underwent right BKA  8/6 required emergent intubation, transfer to ICU  8/8 off insulin drip  SUBJECTIVE:  On precedex but arousable  VITAL SIGNS: Temp:  [100.1 F (37.8 C)-101.9 F (38.8 C)] 100.1 F (37.8 C) (08/08 0753) Pulse Rate:  [31-121] 99 (08/08 0739) Resp:  [21-36] 33 (08/08 0739) BP: (80-178)/(57-85) 152/84 mmHg (08/08 0739) SpO2:  [83 %-100 %] 97 % (08/08 0739) FiO2 (%):  [40 %-50 %] 40 % (08/08 0742) Weight:  [135 lb 12.9 oz (61.6 kg)] 135 lb 12.9 oz (61.6 kg) (08/08 0500) VENTILATOR SETTINGS: Vent Mode:  [-] PRVC FiO2 (%):  [40 %-50 %] 40 % Set Rate:  [24 bmp] 24 bmp Vt Set:  [500 mL] 500 mL PEEP:  [5 cmH20] 5 cmH20 Pressure Support:  [12 cmH20] 12 cmH20 Plateau Pressure:  [22 cmH20-30 cmH20] 22 cmH20 INTAKE / OUTPUT:  Intake/Output Summary (Last 24 hours) at 07/14/14 0846 Last data filed at 07/14/14 0700  Gross per 24 hour  Intake 2918.16 ml  Output   3220 ml  Net -301.84 ml    PHYSICAL EXAMINATION: General: Chronically ill appearing female, follows commands intermittently Neuro: Follows some commands HEENT: Ott/OGT/ no jvd/lan  Cardiovascular: HSR RRR, no M/R/G.  Lungs:rhonchi through  out.  Abdomen: BS x 4, soft, NT/ND.  Musculoskeletal: Bilateral BKA's, new right sided BKA staples noted, no dressing, no edema.  Skin: Intact, cool, no rashes.   LABS:  CBC  Recent Labs Lab 07/12/14 0407 07/12/14 0842 07/14/14 0137  WBC 25.4* 26.9* 20.4*  HGB 9.5* 9.6* 8.3*  HCT 29.5* 29.8* 25.1*  PLT 278 343 199   BMET  Recent Labs Lab 07/13/14 1400 07/13/14 1800 07/14/14 0137  NA 155* 155* 138  K 2.9* 3.1* 5.0  CL 116* 118* 109  CO2 21 24 19   BUN 18 14 11   CREATININE 1.69* 1.53* 1.29*  GLUCOSE 247* 126* 465*   Electrolytes  Recent Labs Lab 07/11/14 0330  07/12/14 1107  07/13/14 0835 07/13/14 1400 07/13/14 1800 07/14/14 0137  CALCIUM 8.7  < >  --   < > 7.5* 7.4* 7.6* 7.0*  MG 2.0  --  2.6*  --  1.9  --   --   --   PHOS  --   --   --   --  <0.5*  --   --   --   < > = values in this interval not displayed.  Sepsis Markers  Recent Labs Lab 07/12/14 1520 07/12/14 2021 07/13/14 0221  LATICACIDVEN 2.7* 2.6* 2.4*   ABG  Recent Labs Lab 07/12/14 1441 07/12/14 2016 07/13/14 1145  PHART 7.338* 7.484* 7.454*  PCO2ART 28.8* 32.1* 33.2*  PO2ART 60.0* 64.0* PENDING   Liver Enzymes  Recent Labs Lab 07/12/14 1445 07/13/14 0835 07/14/14 0137  AST  --  40* 40*  ALT  --  18 18  ALKPHOS  --  141* 122*  BILITOT  --  0.5 0.5  ALBUMIN 2.2* 2.1* 1.8*   Cardiac Enzymes  Recent Labs Lab 07/10/14 0520 07/12/14 1505 07/12/14 2021 07/13/14 0221  TROPONINI  --  <0.30 0.37* 0.99*  PROBNP 3151.0*  --   --   --    Glucose  Recent Labs Lab 07/13/14 2036 07/13/14 2135 07/13/14 2309 07/14/14 0024 07/14/14 0141 07/14/14 0339  GLUCAP 158* 167* 106* 94 126* 192*    Imaging Ct Angio Head W/cm &/or Wo Cm  07/13/2014   CLINICAL DATA:  Unable to move right side, acute stroke.  EXAM: CT ANGIOGRAPHY HEAD AND NECK  TECHNIQUE: Multidetector CT imaging of the head and neck was performed using the standard protocol during bolus administration of  intravenous contrast. Multiplanar CT image reconstructions and MIPs were obtained to evaluate the vascular anatomy. Carotid stenosis measurements (when applicable) are obtained utilizing NASCET criteria, using the distal internal carotid diameter as the denominator.  CONTRAST:  55mL OMNIPAQUE IOHEXOL 350 MG/ML SOLN  COMPARISON:  CT of the head July 12, 2014 at 2335 hr  FINDINGS: CTA HEAD FINDINGS  Anterior circulation: Petrous, cavernous internal carotid arteries are patent. Severe calcific atherosclerosis of the carotid siphons with at least 50% narrowing and irregularity bilaterally. High-grade stenosis of the right carotid terminus. Diminutive right A1 segment with robust anterior communicating artery and left A1 segment. Anterior and middle cerebral arteries appear widely patent.  Posterior circulation: The left vertebral artery predominantly terminates in the left posterior-inferior cerebellar artery. Diminutive though patent bilateral vertebral arteries. Basilar artery is patent with may normal main branch vessels. Robust left posterior communicating artery. Normal appearance of the posterior cerebral arteries.  Remote left cerebellar infarct. No midline shift or mass effect. No hydrocephalus. No abnormal enhancement.  Review of the MIP images confirms the above findings.  CTA NECK FINDINGS  Normal appearance of the thoracic arch, mild calcific atherosclerosis. Aberrant right subclavian artery, coursing posterior to the trachea and esophagus. Approximately 75% stenosis of the right subclavian artery due to intimal thickening focally, with calcific atherosclerosis and intimal thickening, the vessel remains patent. At least 50% stenosis of the left subclavian artery approximately 27 mm above the origin.  Right Common carotid artery is the first vessel arising from the aortic arch, with at least 50% narrowing. At least 50% narrowing of the origin of the left Common carotid artery due to intimal thickening.  Common carotid arteries are normal in course and caliber, widely patent. Mild intimal irregularity bilaterally consistent with atherosclerosis. Intimal thickening calcific atherosclerosis of the carotid bulbs, soft tissue about the right carotid bifurcation with surgical clips suggest prior carotid endarterectomy. Calcific atherosclerosis narrows the origin of the left internal carotid artery to 2 mm, resulting in approximately 66% stenosis by NASCET criteria.  High-grade stenosis the origin of the right vertebral artery, at which arises from the upper right subclavian artery at of locus of intimal thickening and calcification. Occluded origin of the left vertebral artery, with reconstitution at the left foraminal segment. The ready irregular left vertebral artery foraminal segment to level of C2-3.  No hemodynamically significant stenosis by NASCET criteria. No dissection, no pseudoaneurysm. No abnormal luminal irregularity. No contrast extravasation.  15 mm right thyroid nodule. No acute osseous process though bone windows have not been submitted. Included view of the lungs demonstrates interstitial prominence  and ground-glass opacities suggesting pulmonary edema.  Review of the MIP images confirms the above findings.  IMPRESSION: CTA neck: Aberrant right subclavian artery with at least 75% stenosis of the right subclavian artery, and 50% stenosis of the left subclavian artery due to calcific atherosclerosis.  Occluded origin left vertebral artery with irregular intraforaminal segment which may reflect chronic dissection, vessel remains patent. High-grade stenosis of the origin of the right vertebral artery.  Status post apparent right carotid endarterectomy. Approximately 66% narrowing of the left internal carotid artery origin by NASCET criteria.  CTA head: Calcific atherosclerosis of the carotid siphons with at least 50% stenosis.  High-grade stenosis of the right carotid terminus. No large vessel occlusion.    Electronically Signed   By: Elon [name]   On: 07/13/2014 03:44   Ct Angio Neck W/cm &/or Wo/cm  07/13/2014   CLINICAL DATA:  Unable to move right side, acute stroke.  EXAM: CT ANGIOGRAPHY HEAD AND NECK  TECHNIQUE: Multidetector CT imaging of the head and neck was performed using the standard protocol during bolus administration of intravenous contrast. Multiplanar CT image reconstructions and MIPs were obtained to evaluate the vascular anatomy. Carotid stenosis measurements (when applicable) are obtained utilizing NASCET criteria, using the distal internal carotid diameter as the denominator.  CONTRAST:  53mL OMNIPAQUE IOHEXOL 350 MG/ML SOLN  COMPARISON:  CT of the head July 12, 2014 at 2335 hr  FINDINGS: CTA HEAD FINDINGS  Anterior circulation: Petrous, cavernous internal carotid arteries are patent. Severe calcific atherosclerosis of the carotid siphons with at least 50% narrowing and irregularity bilaterally. High-grade stenosis of the right carotid terminus. Diminutive right A1 segment with robust anterior communicating artery and left A1 segment. Anterior and middle cerebral arteries appear widely patent.  Posterior circulation: The left vertebral artery predominantly terminates in the left posterior-inferior cerebellar artery. Diminutive though patent bilateral vertebral arteries. Basilar artery is patent with may normal main branch vessels. Robust left posterior communicating artery. Normal appearance of the posterior cerebral arteries.  Remote left cerebellar infarct. No midline shift or mass effect. No hydrocephalus. No abnormal enhancement.  Review of the MIP images confirms the above findings.  CTA NECK FINDINGS  Normal appearance of the thoracic arch, mild calcific atherosclerosis. Aberrant right subclavian artery, coursing posterior to the trachea and esophagus. Approximately 75% stenosis of the right subclavian artery due to intimal thickening focally, with calcific atherosclerosis and intimal  thickening, the vessel remains patent. At least 50% stenosis of the left subclavian artery approximately 27 mm above the origin.  Right Common carotid artery is the first vessel arising from the aortic arch, with at least 50% narrowing. At least 50% narrowing of the origin of the left Common carotid artery due to intimal thickening. Common carotid arteries are normal in course and caliber, widely patent. Mild intimal irregularity bilaterally consistent with atherosclerosis. Intimal thickening calcific atherosclerosis of the carotid bulbs, soft tissue about the right carotid bifurcation with surgical clips suggest prior carotid endarterectomy. Calcific atherosclerosis narrows the origin of the left internal carotid artery to 2 mm, resulting in approximately 66% stenosis by NASCET criteria.  High-grade stenosis the origin of the right vertebral artery, at which arises from the upper right subclavian artery at of locus of intimal thickening and calcification. Occluded origin of the left vertebral artery, with reconstitution at the left foraminal segment. The ready irregular left vertebral artery foraminal segment to level of C2-3.  No hemodynamically significant stenosis by NASCET criteria. No dissection, no pseudoaneurysm. No abnormal luminal irregularity. No  contrast extravasation.  15 mm right thyroid nodule. No acute osseous process though bone windows have not been submitted. Included view of the lungs demonstrates interstitial prominence and ground-glass opacities suggesting pulmonary edema.  Review of the MIP images confirms the above findings.  IMPRESSION: CTA neck: Aberrant right subclavian artery with at least 75% stenosis of the right subclavian artery, and 50% stenosis of the left subclavian artery due to calcific atherosclerosis.  Occluded origin left vertebral artery with irregular intraforaminal segment which may reflect chronic dissection, vessel remains patent. High-grade stenosis of the origin of the  right vertebral artery.  Status post apparent right carotid endarterectomy. Approximately 66% narrowing of the left internal carotid artery origin by NASCET criteria.  CTA head: Calcific atherosclerosis of the carotid siphons with at least 50% stenosis.  High-grade stenosis of the right carotid terminus. No large vessel occlusion.   Electronically Signed   By: Elon [name]   On: 07/13/2014 03:44   Portable Chest Xray In Am  07/13/2014   CLINICAL DATA:  Assess airspace disease  EXAM: PORTABLE CHEST - 1 VIEW  COMPARISON:  07/12/2014  FINDINGS: Endotracheal tube ends between the clavicular heads and carina. Orogastric tube is coiled in the stomach. Left upper extremity PICC, tip at the upper cavoatrial junction.  Improved pulmonary inflation. There is persistent bibasilar coarse interstitial opacities. Air bronchograms at the left base. No evidence of effusion or pneumothorax. Calcified chronic pancreatitis.  IMPRESSION: 1. Tubes and central line are in good position. 2. Improved pulmonary inflation. Residual basilar opacities could reflect edema or infection.   Electronically Signed   By: Jorje Guild M.D.   On: 07/13/2014 06:23   Dg Abd Portable 1v  07/13/2014   CLINICAL DATA:  Orogastric tube placement  EXAM: PORTABLE ABDOMEN - 1 VIEW  COMPARISON:  07/07/2014  FINDINGS: The orogastric tube is coiled in the stomach. The tip is in the fundus.  Nonobstructive bowel gas pattern.  Chronic pancreatitis with diffuse parenchymal calcification.  Interstitial opacities in the lower lungs. No evidence of change from yesterday's chest x-ray.  IMPRESSION: 1. Orogastric tube is coiled in the stomach. 2. Nonobstructive bowel gas pattern. 3. Chronic pancreatitis.   Electronically Signed   By: Jorje Guild M.D.   On: 07/13/2014 02:37     ASSESSMENT / PLAN: PULMONARY ETT 8/06 >>  A:  Acute hypoxemic respiratory failure secondary to pneumonia and severe acidosis.  HCAP vs aspiration pneumonia in setting of  encephalopathy. ? CVA  P:  Goal SpO2 > 92%.  Full mechanical support, high Ve >> attempting wean. VAP bundle.  Daily SBT Trend ABG / CXR  Albuterol PRN   CARDIOVASCULAR  A:  Hypotension in setting of severe metabolic acidosis and hypovolemia. Sepsis, lactate reassuring. Chronic systolic heart failure, preserved systolic function EF 73% 8/6. R BKA 8/4. P:  Goal MAP > 65.  Trend troponin / lactate  Vascular following for R BKA  RENAL  A:  AG metabolic acidosis, likely secondary to DKA ( urine ketones positive 8/4 ) >> Lactate is low and out of proportion with degree of acidosis  AKI  Hypernatremia (resolved) Hypokalemia (resolved) Hypovolemia (improved) P:  F/u BMET  GASTROINTESTINAL  A:  Nutrition  GIPx  P:  NPO.  Protonix  Start tube feeds  HEMATOLOGIC  A:  VTE Prophylaxis  Anemia of critical illness P:  SCD / Heparin [place]  Trend CBC   INFECTIOUS  A:  HIV  HCV  Possible HCAP  P:  ID follwing  Blood cx 8/4 >>>  Sputum cx 8/6 >>>  Vancomycin 8/4 >>>  Zosyn 8/4 >>>  Holding HAART   ENDOCRINE  A:  DM 2  >> Likely DKA without significant hyperglycemia  P:  SSI  NEUROLOGIC  A:  Acute metabolic encephalopathy CVA Rt side hemiparesis P:  Neurology following - appreciate recs -- MRI, TTE (when stable), low threshold for LP if s\s of encephalitis\meningitis.  High risk patient, HIV + on HAART RASS goal 0 to -1  Fentanyl PRN MRI 24 -48 hrs when stable  Wean per protocol now that electrolytes are in less disarray.  Will need tube feeds or extubation soon,most likely tube feeds.   Richardson Landry Minor ACNP Maryanna Shape PCCM Pager [phone] till 3 pm If no answer page [phone] 07/14/2014, 8:56 AM  Reviewed above, examined, and documentation changes made as needed.  CC time 35 minutes.  Chesley Mires, MD [facility] Pulmonary/Critical Care 07/14/2014, 11:37 AM Pager:  [phone] After 3pm call: [phone]

## 2014-07-14 NOTE — Progress Notes (Signed)
Stroke Team Progress Note   Admit Date: 06/11/2014  LOS: 10 days   HISTORY [name] is an 58 y.o. female with a history of HIV infection, nonischemic cardiomyopathy, peripheral vascular disease, hypercholesterolemia and diabetes mellitus who is 3 days postop right BK amputation, noted to not be moving her right side. She is currently intubated and on mechanical ventilation, but on no sedating medications. She was transferred to the intensive care unit following onset of hypotension, respiratory distress and worsening of metabolic acidosis at around 1 PM on 07/12/2014. She was last seen moving the right arm and hand at around 8 PM tonight. Stat CT scan of her head showed no acute intracranial abnormality. Patient has been on aspirin 81 mg per day. She had a low-grade temp of 100.5. She's currently on Zosyn and vancomycin.  SUBJECTIVE  no family member is at the bedside during am rounds.  Her condition is still very critical. Her BP is in 120`s and currently on levophed drip. Able to open eyes on voice and will follow   commands. Left arm spontaneously moving but not right arm or leg.  OBJECTIVE Most recent Vital Signs: Filed Vitals:   07/14/14 0700 07/14/14 0715 07/14/14 0739 07/14/14 0753  BP: 156/83 153/82 152/84   Pulse: 88 85 99   Temp:    100.1 F (37.8 C)  TempSrc:    Oral  Resp: 24 24 33   Height:      Weight:      SpO2: 98% 99% 97%    CBG (last 3)   Recent Labs  07/14/14 0141 07/14/14 0339 07/14/14 0751  GLUCAP 126* 192* 97     IV Fluid Intake:   . dexmedetomidine 0.2 mcg/kg/hr (07/13/14 2000)  . feeding supplement (GLUCERNA 1.2 CAL)    . phenylephrine (NEO-SYNEPHRINE) Adult infusion 100 mcg/min (07/14/14 0200)    Medications: Scheduled:  . antiseptic oral rinse  7 mL Mouth Rinse QID  . aspirin EC  81 mg Oral Daily  . chlorhexidine  15 mL Mouth Rinse BID  . heparin  5,000 Units Subcutaneous 3 times per day  . insulin aspart  0-15 Units Subcutaneous 6 times  per day  . insulin glargine  10 Units Subcutaneous QHS  . multivitamin with minerals  1 tablet Per Tube Daily  . pantoprazole (PROTONIX) IV  40 mg Intravenous Daily  . piperacillin-tazobactam (ZOSYN)  IV  3.375 g Intravenous Q8H  . sodium chloride  500 mL Intravenous Once  . sodium chloride  10-40 mL Intracatheter Q12H  . vancomycin  750 mg Intravenous Q24H   Continuous Infusions:  . dexmedetomidine 0.2 mcg/kg/hr (07/13/14 2000)  . feeding supplement (GLUCERNA 1.2 CAL)    . phenylephrine (NEO-SYNEPHRINE) Adult infusion 100 mcg/min (07/14/14 0200)     Diet:  NPO  Activity:  Bedrest DVT Prophylaxis:  Heparin subq  CLINICALLY SIGNIFICANT STUDIES Basic Metabolic Panel:  Recent Labs Lab 07/12/14 1107  07/13/14 0835  07/13/14 1800 07/14/14 0137  NA  --   < > 156*  < > 155* 138  K  --   < > 2.3*  < > 3.1* 5.0  CL  --   < > 114*  < > 118* 109  CO2  --   < > 24  < > 24 19  GLUCOSE  --   < > 172*  < > 126* 465*  BUN  --   < > 19  < > 14 11  CREATININE  --   < >  1.61*  < > 1.53* 1.29*  CALCIUM  --   < > 7.5*  < > 7.6* 7.0*  MG 2.6*  --  1.9  --   --   --   PHOS  --   --  <0.5*  --   --   --   < > = values in this interval not displayed. Liver Function Tests:   Recent Labs Lab 07/13/14 0835 07/14/14 0137  AST 40* 40*  ALT 18 18  ALKPHOS 141* 122*  BILITOT 0.5 0.5  PROT 6.6 5.7*  ALBUMIN 2.1* 1.8*   CBC:   Recent Labs Lab 07/12/14 0842 07/14/14 0137  WBC 26.9* 20.4*  NEUTROABS 24.5*  --   HGB 9.6* 8.3*  HCT 29.8* 25.1*  MCV 93.4 90.0  PLT 343 199   Coagulation: No results found for this basename: LABPROT, INR,  in the last 168 hours Cardiac Enzymes:   Recent Labs Lab 07/12/14 1505 07/12/14 2021 07/13/14 0221 07/13/14 1200  CKTOTAL  --   --   --  541*  CKMB  --   --   --  2.7  TROPONINI <0.30 0.37* 0.99*  --    Urinalysis:   Recent Labs Lab 07/10/14 1358 07/12/14 1401 07/14/14 0029  COLORURINE YELLOW YELLOW  --   LABSPEC 1.015 1.012  --    PHURINE 6.0 6.5  --   GLUCOSEU 500* 500*  --   HGBUR LARGE* LARGE*  --   BILIRUBINUR NEGATIVE NEGATIVE  --   KETONESUR >80* >80* NEGATIVE  PROTEINUR 100* NEGATIVE  --   UROBILINOGEN 0.2 0.2  --   NITRITE NEGATIVE NEGATIVE  --   LEUKOCYTESUR NEGATIVE NEGATIVE  --    Lipid Panel    Component Value Date/Time   CHOL 83 07/14/2014 0137   TRIG 156* 07/14/2014 0137   HDL 13* 07/14/2014 0137   CHOLHDL 6.4 07/14/2014 0137   VLDL 31 07/14/2014 0137   LDLCALC 39 07/14/2014 0137   HgbA1C .resu Lab Results  Component Value Date   HGBA1C 6.2* 05/09/2013   TSH  Lab Results  Component Value Date   TSH 1.199 04/18/2014   Urine Drug Screen:     Component Value Date/Time   LABOPIA POSITIVE* 02/21/2013 0151   COCAINSCRNUR NONE DETECTED 02/21/2013 0151   LABBENZ NONE DETECTED 02/21/2013 0151   AMPHETMU NONE DETECTED 02/21/2013 0151   THCU NONE DETECTED 02/21/2013 0151   LABBARB NONE DETECTED 02/21/2013 0151    Alcohol Level: No results found for this basename: ETH,  in the last 168 hours  I personally reviewed the image studies below and agree with the interpretation.  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  Occluded origin left vertebral artery with irregular intraforaminal segment which may reflect chronic dissection, vessel remains patent. High-grade stenosis of the origin of the right vertebral artery.  Status post apparent right carotid endarterectomy. Approximately 66% narrowing of the left internal carotid artery origin by NASCET criteria.  CTA head: Calcific atherosclerosis of the carotid siphons with at least 50% stenosis.  High-grade stenosis of the right carotid terminus. No large vessel occlusion.     Ct Head Wo Contrast  07/13/2014    IMPRESSION: 1. No acute intracranial findings. 2. Chronic ischemic injuries  described above. 3. Chronically obstructed left maxillary sinus.  Chest Xray  07/13/2014    IMPRESSION: 1. Tubes and central line are in good position. 2. Improved pulmonary inflation. Residual basilar opacities could reflect edema or infection.     07/12/2014   IMPRESSION: Endotracheal tube tip lies at the level of the upper carina.  Left PICC tip lies at the caval atrial junction.  No change in lung aeration.   Dg Chest Port 1 View  07/12/2014   IMPRESSION: 1. Lower lung volumes. 2. Persistent bibasilar opacities which could reflect atelectasis or pneumonia. 3. Probable superimposed pulmonary edema.     Dg Abd   07/13/2014  IMPRESSION: 1. Orogastric tube is coiled in the stomach. 2. Nonobstructive bowel gas pattern. 3. Chronic pancreatitis.    MRI of the brain  pending  2D Echocardiogram  07/12/14 - Left ventricle: The cavity size was normal. Systolic function was vigorous. The estimated ejection fraction was in the range of 65% to 70%. Wall motion was normal; there were no regional wall motion abnormalities. Doppler parameters are consistent with abnormal left ventricular relaxation (grade 1 diastolic dysfunction).  EKG  06/14/2014 sinus brady. For complete results please see formal report.  EEG :07/13/14  mild generalized slowing. No seizures PT/OT Recommendations pending  Physical Exam Temp:  [100.1 F (37.8 C)-101.9 F (38.8 C)] 100.1 F (37.8 C) (08/08 0753) Pulse Rate:  [31-121] 99 (08/08 0739) Resp:  [21-36] 33 (08/08 0739) BP: (82-178)/(57-85) 152/84 mmHg (08/08 0739) SpO2:  [83 %-100 %] 97 % (08/08 0739) FiO2 (%):  [40 %-50 %] 40 % (08/08 0742) Weight:  [61.6 kg (135 lb 12.9 oz)] 61.6 kg (135 lb 12.9 oz) (08/08 0500)  General - thin, well developed, intubated not on sedation, open eyes and will following commands.  Ophthalmologic - not cooperative on exam.  Cardiovascular - Regular rhythm with tachycardia.  Lung - decreased breath sound bilaterally  Extremities -  bilateral BK amputation  Neuro- drowsy, open eyes and consistently following commands, intubated without sedation, blinking to visual threat bilaterally, pupil 28mm bilaterally, reactive to light, spontaneously eye movement, but more middle position preference, corneal present, cough and gag present, left UE spontaneously movement, LLE against gravity on pain stimulation, RUE and RLE mild weakness unable to hold up against gravity but will withdraw to pain ASSESSMENT [name] is a 58 y.o. female with PMH of HIV, nonischemic cardiomyopathy, PVD, HLD and DM with previous left BK amputation admitted for right BK amputation. Pt condition deteriorated yesterday due to hypotension and respiratory distress which required intubation and transfer to ICU. She was found to not moving right UE and LE which triggered for neurology consult. By reviewing her record, it seemed that she had CUS 10/2013 showed right s/p CEA but left ICA 60-79% stenosis. Her CTA overnight also confirmed left ICA 65-70% stenosis with diffuse multivessel severe asthrosclerotic changes. 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 She had multiple stroke risk factors:  1. Hypotension - her BP overnight was low and even this morning better in 120`s with levophed. With the tight stenosis on the left ICA, hypotension could impair the left hemisphere brain perfusion which leads to watershed infarct. MRI brain may give a further hint on the distribution of infarct and BP management is the key. Please avoid hypotension.  2. Artery to artery emboli - pt has tight left ICA stenosis and diffuse multivessel severe asthrosclerotic changes. A-A emboli is possible from left ICA. MRI brain recommended to see if stroke is within a large  vessel territory. Future CEA may be considered.  3. cardioembolic - pt has history of HIV, with fever, although 07/10/14 blood culture so far NGTD, but endocarditis not able to rule out.  Recommend to repeat blood culture, and consider TEE once pt stable. ID is on board.  4. Paradoxical emboli - pt has right BK amputation 3 days ago, LE DVT needs to be ruled out. If pt also has PFO, then paradoxical emboli is in the differential. Pt has respiratory distress, may also need to rule out PE. Recommend UE and LE U/S to rule out DVT.   5. Small vessel event - pt has stroke risk factors such as DM, HLD, HIV with diffuse asthrosclerotic changes, thrombotic events are certainly possible. MRI brain and risk factor modification.   6. Beside consideration of stroke, she has hx of HIV with mental status changes and fever, we have low threshold to have LP to rule out CNS infection.   7. Subclinical seizure -  EEG done shows no evidence of seizures.  TREATMENT/PLAN  Recommend MRI brain without contrast once stable preferably prior to extubation  UE and LE venous doppler to rule out DVT  Low threshold to consider LP to rule out CNS infection  May consider TEE once stable incase MRI shows bilateral embolic infarcts  BP management, please avoid hypotension, goal SBP 110-160  Consider change ASA 81 mg to 325mg  if not contraindicated  Continue antibiotics as per ID  GI and DVT prophylaxis  D/W Dr Halford Chessman  Will follow   SIGNED This patient is critically ill due to AMS and respiratory failure with likely left hemisphere stroke and at significant risk of neurological worsening, death form respiratory failure, metabolic acidosis and recurrent   strokes. This patient's care requires constant monitoring of vital signs, hemodynamics, respiratory and cardiac monitoring, review of multiple databases, neurological assessment, discussion with family, other specialists and medical decision making of high complexity. I spent 35 minutes of neurocritical care time in the care of this patient.  Antony Contras, MD  07/14/2014 9:33 AM   To contact Stroke Continuity provider, please refer to  [email or web address] After hours, contact General Neurology

## 2014-07-14 NOTE — Progress Notes (Signed)
ANTIBIOTIC CONSULT NOTE - FOLLOW UP  Pharmacy Consult for Vancomcyin + Zosyn Indication: rule out pneumonia  No Known Allergies  Patient Measurements: Height: 5\' 4"  (162.6 cm) Weight: 135 lb 12.9 oz (61.6 kg) IBW/kg (Calculated) : 54.7  Vital Signs: Temp: 99.7 F (37.6 C) (08/08 1201) Temp src: Oral (08/08 1201) BP: 138/82 mmHg (08/08 1245) Pulse Rate: 88 (08/08 1245) Intake/Output from previous day: 08/07 0701 - 08/08 0700 In: 3025.7 [I.V.:2235.7; NG/GT:90; IV Piggyback:700] Out: 0349 [Urine:3420] Intake/Output from this shift: Total I/O In: 228.6 [I.V.:228.6] Out: 820 [Urine:820]  Labs:  Recent Labs  07/12/14 0407 07/12/14 0842  07/13/14 1400 07/13/14 1800 07/14/14 0137  WBC 25.4* 26.9*  --   --   --  20.4*  HGB 9.5* 9.6*  --   --   --  8.3*  PLT 278 343  --   --   --  199  CREATININE [name]79*  --   < > [name]69* [name]53* [name]29*  < > = values in this interval not displayed. Estimated Creatinine Clearance: 40.5 ml/min (by C-G formula based on Cr of [name]29). No results found for this basename: VANCOTROUGH, Corlis Leak, VANCORANDOM, Deshler, GENTPEAK, GENTRANDOM, TOBRATROUGH, TOBRAPEAK, TOBRARND, AMIKACINPEAK, AMIKACINTROU, AMIKACIN,  in the last 72 hours   Microbiology: Recent Results (from the past 720 hour(s))  CULTURE, BLOOD (ROUTINE X 2)     Status: None   Collection Time    06/26/2014  2:40 PM      Result Value Ref Range Status   Specimen Description BLOOD LEFT HAND   Final   Special Requests BOTTLES DRAWN AEROBIC AND ANAEROBIC 2CC   Final   Culture  Setup Time     Final   Value: 06/27/2014 21:54     Performed at [facility]   Culture     Final   Value: NO GROWTH 5 DAYS     Performed at [facility]   Report Status 07/10/2014 FINAL   Final  MRSA PCR SCREENING     Status: Abnormal   Collection Time    06/27/2014  4:46 PM      Result Value Ref Range Status   MRSA by PCR POSITIVE (*) NEGATIVE Final   Comment:            The GeneXpert MRSA Assay (FDA      approved for NASAL specimens     only), is one component of a     comprehensive MRSA colonization     surveillance program. It is not     intended to diagnose MRSA     infection nor to guide or     monitor treatment for     MRSA infections.     RESULT CALLED TO, READ BACK BY AND VERIFIED WITH:     Durenda Age RN 19:05 06/13/2014 ([email or web address])  CULTURE, BLOOD (ROUTINE X 2)     Status: None   Collection Time    06/18/2014  6:12 PM      Result Value Ref Range Status   Specimen Description BLOOD HAND RIGHT   Final   Special Requests BOTTLES DRAWN AEROBIC AND ANAEROBIC 2CC   Final   Culture  Setup Time     Final   Value: 07/05/2014 00:36     Performed at [facility]   Culture     Final   Value: NO GROWTH 5 DAYS     Performed at [facility]   Report Status 07/11/2014 FINAL   Final  URINE  CULTURE     Status: None   Collection Time    06/08/2014 10:40 PM      Result Value Ref Range Status   Specimen Description URINE, RANDOM   Final   Special Requests NONE   Final   Culture  Setup Time     Final   Value: 07/03/2014 22:57     Performed at [facility]     Final   Value: NO GROWTH     Performed at [facility]   Culture     Final   Value: NO GROWTH     Performed at [facility]   Report Status 07/05/2014 FINAL   Final  CULTURE, BLOOD (ROUTINE X 2)     Status: None   Collection Time    07/10/14  9:20 AM      Result Value Ref Range Status   Specimen Description BLOOD RIGHT ARM   Final   Special Requests BOTTLES DRAWN AEROBIC ONLY 5CC   Final   Culture  Setup Time     Final   Value: 07/10/2014 15:54     Performed at [facility]   Culture     Final   Value:        BLOOD CULTURE RECEIVED NO GROWTH TO DATE CULTURE WILL BE HELD FOR 5 DAYS BEFORE ISSUING A FINAL NEGATIVE REPORT     Performed at [facility]   Report Status PENDING   Incomplete  CULTURE, BLOOD (ROUTINE X 2)     Status: None   Collection Time     07/10/14  9:31 AM      Result Value Ref Range Status   Specimen Description BLOOD LEFT HAND   Final   Special Requests BOTTLES DRAWN AEROBIC AND ANAEROBIC 10CC   Final   Culture  Setup Time     Final   Value: 07/10/2014 15:54     Performed at [facility]   Culture     Final   Value:        BLOOD CULTURE RECEIVED NO GROWTH TO DATE CULTURE WILL BE HELD FOR 5 DAYS BEFORE ISSUING A FINAL NEGATIVE REPORT     Performed at [facility]   Report Status PENDING   Incomplete  MRSA PCR SCREENING     Status: None   Collection Time    07/10/14  4:45 PM      Result Value Ref Range Status   MRSA by PCR NEGATIVE  NEGATIVE Final   Comment:            The GeneXpert MRSA Assay (FDA     approved for NASAL specimens     only), is one component of a     comprehensive MRSA colonization     surveillance program. It is not     intended to diagnose MRSA     infection nor to guide or     monitor treatment for     MRSA infections.  URINE CULTURE     Status: None   Collection Time    07/12/14  2:01 PM      Result Value Ref Range Status   Specimen Description URINE, CATHETERIZED   Final   Special Requests NONE   Final   Culture  Setup Time     Final   Value: 07/12/2014 15:20     Performed at [facility]     Final  Value: NO GROWTH     Performed at [facility]     Final   Value: NO GROWTH     Performed at [facility]   Report Status 07/13/2014 FINAL   Final  CULTURE, RESPIRATORY (NON-EXPECTORATED)     Status: None   Collection Time    07/12/14  4:15 PM      Result Value Ref Range Status   Specimen Description TRACHEAL ASPIRATE   Final   Special Requests Immunocompromised   Final   Gram Stain     Final   Value: MODERATE WBC PRESENT,BOTH PMN AND MONONUCLEAR     FEW SQUAMOUS EPITHELIAL CELLS PRESENT     RARE GRAM POSITIVE COCCI     IN PAIRS RARE GRAM VARIABLE ROD     Performed at [facility]   Culture     Final    Value: Non-Pathogenic Oropharyngeal-type Flora Isolated.     Performed at [facility]   Report Status PENDING   Incomplete  CLOSTRIDIUM DIFFICILE BY PCR     Status: None   Collection Time    07/14/14 12:29 AM      Result Value Ref Range Status   C difficile by pcr NEGATIVE  NEGATIVE Final    Anti-infectives   Start     Dose/Rate Route Frequency Ordered Stop   07/14/14 1300  lamiVUDine-zidovudine (COMBIVIR) 150-300 MG per tablet 1 tablet     1 tablet Oral 2 times daily 07/14/14 1109     07/14/14 1300  dolutegravir (TIVICAY) tablet 50 mg     50 mg Oral Daily 07/14/14 1109     07/10/14 1200  piperacillin-tazobactam (ZOSYN) IVPB 3.375 g     3.375 g 12.5 mL/hr over 240 Minutes Intravenous Every 8 hours 07/10/14 0844     07/10/14 1000  vancomycin (VANCOCIN) IVPB 750 mg/150 ml premix     750 mg 150 mL/hr over 60 Minutes Intravenous Every 24 hours 07/10/14 0855     07/17/2014 2200  cefUROXime (ZINACEF) [name]5 g in dextrose 5 % 50 mL IVPB     [name]5 g 100 mL/hr over 30 Minutes Intravenous Every 12 hours 08/02/2014 1423 07/10/14 1123   07/07/2014 0600  [MAR Hold]  cefUROXime (ZINACEF) [name]5 g in dextrose 5 % 50 mL IVPB     (On MAR Hold since 07/31/2014 0923)   [name]5 g 100 mL/hr over 30 Minutes Intravenous On call to O.R. 07/06/14 1047 07/07/2014 1034   07/05/14 1000  emtricitabine-tenofovir (TRUVADA) 200-300 MG per tablet 1 tablet  Status:  Discontinued     1 tablet Oral Daily 06/28/2014 1616 06/14/2014 1839   06/30/2014 1700  dolutegravir (TIVICAY) tablet 50 mg  Status:  Discontinued     50 mg Oral Daily 06/11/2014 1616 07/03/2014 1839      [name], [name]53, CrCl~40 ml/min. Will adjust Vancomycin dose  today, Zosyn dose remains appropriate.   Zosyn 8/4>> Vanc 8/4>>  Goal of Therapy:  Vancomycin trough level 15-20 mcg/ml  Plan:  1. Increase Vancomycin to 1g IV every 24 hours 2. Continue Zosyn 3.375g IV every 8 hours (4 hr infusion) 3.  Will continue to follow renal function, culture results, LOT, and antibiotic de-escalation plans   Alycia Rossetti, PharmD, BCPS Clinical Pharmacist Pager: [phone] 07/14/2014 1:09 PM

## 2014-07-14 NOTE — Progress Notes (Signed)
[facility]  Day #5 vancomycin  Day #5 Zosyn  Subjective: No new complaints   Antibiotics:  Anti-infectives   Start     Dose/Rate Route Frequency Ordered Stop   07/14/14 1115  lamiVUDine-zidovudine (COMBIVIR) 150-300 MG per tablet 1 tablet     1 tablet Oral 2 times daily 07/14/14 1109     07/14/14 1115  dolutegravir (TIVICAY) tablet 50 mg     50 mg Oral Daily 07/14/14 1109     07/10/14 1200  piperacillin-tazobactam (ZOSYN) IVPB 3.375 g     3.375 g 12.5 mL/hr over 240 Minutes Intravenous Every 8 hours 07/10/14 0844     07/10/14 1000  vancomycin (VANCOCIN) IVPB 750 mg/150 ml premix     750 mg 150 mL/hr over 60 Minutes Intravenous Every 24 hours 07/10/14 0855     07/18/2014 2200  cefUROXime (ZINACEF) 1.5 g in dextrose 5 % 50 mL IVPB     1.5 g 100 mL/hr over 30 Minutes Intravenous Every 12 hours 07/25/2014 1423 07/10/14 1123   07/07/2014 0600  [MAR Hold]  cefUROXime (ZINACEF) 1.5 g in dextrose 5 % 50 mL IVPB     (On MAR Hold since 07/18/2014 0923)   1.5 g 100 mL/hr over 30 Minutes Intravenous On call to O.R. 07/06/14 1047 08/01/2014 1034   07/05/14 1000  emtricitabine-tenofovir (TRUVADA) 200-300 MG per tablet 1 tablet  Status:  Discontinued     1 tablet Oral Daily 06/13/2014 1616 06/23/2014 1839   06/22/2014 1700  dolutegravir (TIVICAY) tablet 50 mg  Status:  Discontinued     50 mg Oral Daily 07/03/2014 1616 06/26/2014 1839      Medications: Scheduled Meds: . antiseptic oral rinse  7 mL Mouth Rinse QID  . aspirin EC  81 mg Oral Daily  . chlorhexidine  15 mL Mouth Rinse BID  . dolutegravir  50 mg Oral Daily  . heparin  5,000 Units Subcutaneous 3 times per day  . insulin aspart  0-15 Units Subcutaneous 6 times per day  . insulin glargine  10 Units Subcutaneous QHS  . lamiVUDine-zidovudine  1 tablet Oral BID  . multivitamin with minerals  1 tablet Per Tube Daily  . pantoprazole (PROTONIX) IV  40 mg Intravenous Daily  . piperacillin-tazobactam (ZOSYN)  IV  3.375 g  Intravenous Q8H  . sodium chloride  10-40 mL Intracatheter Q12H  . vancomycin  750 mg Intravenous Q24H   Continuous Infusions: . dexmedetomidine 0.6 mcg/kg/hr (07/14/14 1106)  . feeding supplement (GLUCERNA 1.2 CAL)    . phenylephrine (NEO-SYNEPHRINE) Adult infusion 80 mcg/min (07/14/14 1106)   PRN Meds:.acetaminophen (TYLENOL) oral liquid 160 mg/5 mL, albuterol, fentaNYL, fentaNYL, sodium chloride    Objective: Weight change: 3 lb 4.9 oz (1.5 kg)  Intake/Output Summary (Last 24 hours) at 07/14/14 1110 Last data filed at 07/14/14 0700  Gross per 24 hour  Intake 2129.89 ml  Output   2820 ml  Net -690.11 ml   Blood pressure 152/84, pulse 99, temperature 100.1 F (37.8 C), temperature source Oral, resp. rate 33, height 5\' 4"  (1.626 m), weight 135 lb 12.9 oz (61.6 kg), SpO2 97.00%. Temp:  [100.1 F (37.8 C)-101.9 F (38.8 C)] 100.1 F (37.8 C) (08/08 0753) Pulse Rate:  [31-121] 99 (08/08 0739) Resp:  [21-36] 33 (08/08 0739) BP: (82-178)/(58-85) 152/84 mmHg (08/08 0739) SpO2:  [83 %-100 %] 97 % (08/08 0739) FiO2 (%):  [40 %-50 %] 40 % (08/08 0742) Weight:  [135 lb 12.9 oz (61.6 kg)]  135 lb 12.9 oz (61.6 kg) (08/08 0500)  Physical Exam: General: Alert and awake, in restraints HEENT: anicteric sclera, pupils reactive to light and accommodation, EOMI CVS regular rate, normal r,  no murmur rubs or gallops Chest: clear to auscultation bilaterally, no wheezing, rales or rhonchi Abdomen: soft nontender, nondistended, normal bowel sounds, Extremities: 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 clean  Neuro: nonfocal  CBC:  Recent Labs Lab 07/10/14 0520 07/11/14 0330 07/12/14 0407 07/12/14 0842 07/14/14 0137  HGB 10.3* 9.7* 9.5* 9.6* 8.3*  HCT 31.8* 30.2* 29.5* 29.8* 25.1*  PLT 250 253 278 343 199     BMET  Recent Labs  07/13/14 1800 07/14/14 0137  NA 155* 138  K 3.1* 5.0  CL 118* 109  CO2 24 19  GLUCOSE 126* 465*  BUN 14 11  CREATININE 1.53* 1.29*  CALCIUM 7.6* 7.0*     Liver  Panel   Recent Labs  07/13/14 0835 07/14/14 0137  PROT 6.6 5.7*  ALBUMIN 2.1* 1.8*  AST 40* 40*  ALT 18 18  ALKPHOS 141* 122*  BILITOT 0.5 0.5       Sedimentation Rate No results found for this basename: ESRSEDRATE,  in the last 72 hours C-Reactive Protein No results found for this basename: CRP,  in the last 72 hours  Micro Results: Recent Results (from the past 720 hour(s))  CULTURE, BLOOD (ROUTINE X 2)     Status: None   Collection Time    06/20/2014  2:40 PM      Result Value Ref Range Status   Specimen Description BLOOD LEFT HAND   Final   Special Requests BOTTLES DRAWN AEROBIC AND ANAEROBIC 2CC   Final   Culture  Setup Time     Final   Value: 06/15/2014 21:54     Performed at [facility]   Culture     Final   Value: NO GROWTH 5 DAYS     Performed at [facility]   Report Status 07/10/2014 FINAL   Final  MRSA PCR SCREENING     Status: Abnormal   Collection Time    06/24/2014  4:46 PM      Result Value Ref Range Status   MRSA by PCR POSITIVE (*) NEGATIVE Final   Comment:            The GeneXpert MRSA Assay (FDA     approved for NASAL specimens     only), is one component of a     comprehensive MRSA colonization     surveillance program. It is not     intended to diagnose MRSA     infection nor to guide or     monitor treatment for     MRSA infections.     RESULT CALLED TO, READ BACK BY AND VERIFIED WITH:     Durenda Age RN 19:05 06/13/2014 ([email or web address])  CULTURE, BLOOD (ROUTINE X 2)     Status: None   Collection Time    06/16/2014  6:12 PM      Result Value Ref Range Status   Specimen Description BLOOD HAND RIGHT   Final   Special Requests BOTTLES DRAWN AEROBIC AND ANAEROBIC 2CC   Final   Culture  Setup Time     Final   Value: 07/05/2014 00:36     Performed at [facility]   Culture     Final   Value: NO GROWTH 5 DAYS     Performed at [facility]   Report Status  07/11/2014 FINAL   Final  URINE CULTURE     Status: None    Collection Time    07/06/2014 10:40 PM      Result Value Ref Range Status   Specimen Description URINE, RANDOM   Final   Special Requests NONE   Final   Culture  Setup Time     Final   Value: 06/17/2014 22:57     Performed at [facility]     Final   Value: NO GROWTH     Performed at [facility]   Culture     Final   Value: NO GROWTH     Performed at [facility]   Report Status 07/05/2014 FINAL   Final  CULTURE, BLOOD (ROUTINE X 2)     Status: None   Collection Time    07/10/14  9:20 AM      Result Value Ref Range Status   Specimen Description BLOOD RIGHT ARM   Final   Special Requests BOTTLES DRAWN AEROBIC ONLY 5CC   Final   Culture  Setup Time     Final   Value: 07/10/2014 15:54     Performed at [facility]   Culture     Final   Value:        BLOOD CULTURE RECEIVED NO GROWTH TO DATE CULTURE WILL BE HELD FOR 5 DAYS BEFORE ISSUING A FINAL NEGATIVE REPORT     Performed at [facility]   Report Status PENDING   Incomplete  CULTURE, BLOOD (ROUTINE X 2)     Status: None   Collection Time    07/10/14  9:31 AM      Result Value Ref Range Status   Specimen Description BLOOD LEFT HAND   Final   Special Requests BOTTLES DRAWN AEROBIC AND ANAEROBIC 10CC   Final   Culture  Setup Time     Final   Value: 07/10/2014 15:54     Performed at [facility]   Culture     Final   Value:        BLOOD CULTURE RECEIVED NO GROWTH TO DATE CULTURE WILL BE HELD FOR 5 DAYS BEFORE ISSUING A FINAL NEGATIVE REPORT     Performed at [facility]   Report Status PENDING   Incomplete  MRSA PCR SCREENING     Status: None   Collection Time    07/10/14  4:45 PM      Result Value Ref Range Status   MRSA by PCR NEGATIVE  NEGATIVE Final   Comment:            The GeneXpert MRSA Assay (FDA     approved for NASAL specimens     only), is one component of a     comprehensive MRSA colonization     surveillance program. It is not     intended  to diagnose MRSA     infection nor to guide or     monitor treatment for     MRSA infections.  URINE CULTURE     Status: None   Collection Time    07/12/14  2:01 PM      Result Value Ref Range Status   Specimen Description URINE, CATHETERIZED   Final   Special Requests NONE   Final   Culture  Setup Time     Final   Value: 07/12/2014 15:20     Performed at [facility]  [place] Count     Final   Value: NO GROWTH     Performed at [facility]   Culture     Final   Value: NO GROWTH     Performed at [facility]   Report Status 07/13/2014 FINAL   Final  CULTURE, RESPIRATORY (NON-EXPECTORATED)     Status: None   Collection Time    07/12/14  4:15 PM      Result Value Ref Range Status   Specimen Description TRACHEAL ASPIRATE   Final   Special Requests Immunocompromised   Final   Gram Stain     Final   Value: MODERATE WBC PRESENT,BOTH PMN AND MONONUCLEAR     FEW SQUAMOUS EPITHELIAL CELLS PRESENT     RARE GRAM POSITIVE COCCI     IN PAIRS RARE GRAM VARIABLE ROD     Performed at [facility]   Culture     Final   Value: NO GROWTH     Performed at [facility]   Report Status PENDING   Incomplete  CLOSTRIDIUM DIFFICILE BY PCR     Status: None   Collection Time    07/14/14 12:29 AM      Result Value Ref Range Status   C difficile by pcr NEGATIVE  NEGATIVE Final    Studies/Results: Ct Angio Head W/cm &/or Wo Cm  07/13/2014   CLINICAL DATA:  Unable to move right side, acute stroke.  EXAM: CT ANGIOGRAPHY HEAD AND NECK  TECHNIQUE: Multidetector CT imaging of the head and neck was performed using the standard protocol during bolus administration of intravenous contrast. Multiplanar CT image reconstructions and MIPs were obtained to evaluate the vascular anatomy. Carotid stenosis measurements (when applicable) are obtained utilizing NASCET criteria, using the distal internal carotid diameter as the denominator.  CONTRAST:  68mL OMNIPAQUE IOHEXOL 350  MG/ML SOLN  COMPARISON:  CT of the head July 12, 2014 at 2335 hr  FINDINGS: CTA HEAD FINDINGS  Anterior circulation: Petrous, cavernous internal carotid arteries are patent. Severe calcific atherosclerosis of the carotid siphons with at least 50% narrowing and irregularity bilaterally. High-grade stenosis of the right carotid terminus. Diminutive right A1 segment with robust anterior communicating artery and left A1 segment. Anterior and middle cerebral arteries appear widely patent.  Posterior circulation: The left vertebral artery predominantly terminates in the left posterior-inferior cerebellar artery. Diminutive though patent bilateral vertebral arteries. Basilar artery is patent with may normal main branch vessels. Robust left posterior communicating artery. Normal appearance of the posterior cerebral arteries.  Remote left cerebellar infarct. No midline shift or mass effect. No hydrocephalus. No abnormal enhancement.  Review of the MIP images confirms the above findings.  CTA NECK FINDINGS  Normal appearance of the thoracic arch, mild calcific atherosclerosis. Aberrant right subclavian artery, coursing posterior to the trachea and esophagus. Approximately 75% stenosis of the right subclavian artery due to intimal thickening focally, with calcific atherosclerosis and intimal thickening, the vessel remains patent. At least 50% stenosis of the left subclavian artery approximately 27 mm above the origin.  Right Common carotid artery is the first vessel arising from the aortic arch, with at least 50% narrowing. At least 50% narrowing of the origin of the left Common carotid artery due to intimal thickening. Common carotid arteries are normal in course and caliber, widely patent. Mild intimal irregularity bilaterally consistent with atherosclerosis. Intimal thickening calcific atherosclerosis of the carotid bulbs, soft tissue about the right carotid bifurcation with surgical clips suggest prior carotid  endarterectomy. Calcific atherosclerosis narrows the origin of the left internal carotid artery to 2 mm, resulting in approximately 66% stenosis by NASCET criteria.  High-grade stenosis the origin of the right vertebral artery, at which arises from the upper right subclavian artery at of locus of intimal thickening and calcification. Occluded origin of the left vertebral artery, with reconstitution at the left foraminal segment. The ready irregular left vertebral artery foraminal segment to level of C2-3.  No hemodynamically significant stenosis by NASCET criteria. No dissection, no pseudoaneurysm. No abnormal luminal irregularity. No contrast extravasation.  15 mm right thyroid nodule. No acute osseous process though bone windows have not been submitted. Included view of the lungs demonstrates interstitial prominence and ground-glass opacities suggesting pulmonary edema.  Review of the MIP images confirms the above findings.  IMPRESSION: CTA neck: Aberrant right subclavian artery with at least 75% stenosis of the right subclavian artery, and 50% stenosis of the left subclavian artery due to calcific atherosclerosis.  Occluded origin left vertebral artery with irregular intraforaminal segment which may reflect chronic dissection, vessel remains patent. High-grade stenosis of the origin of the right vertebral artery.  Status post apparent right carotid endarterectomy. Approximately 66% narrowing of the left internal carotid artery origin by NASCET criteria.  CTA head: Calcific atherosclerosis of the carotid siphons with at least 50% stenosis.  High-grade stenosis of the right carotid terminus. No large vessel occlusion.   Electronically Signed   By: Elon [name]   On: 07/13/2014 03:44   Ct Head Wo Contrast  07/13/2014   CLINICAL DATA:  Altered mental status.  EXAM: CT HEAD WITHOUT CONTRAST  TECHNIQUE: Contiguous axial images were obtained from the base of the skull through the vertex without intravenous  contrast.  COMPARISON:  06/22/2007  FINDINGS: Skull and Sinuses:Complete opacification of the imaged portions of the left maxillary sinus, new from 2008. There is wall thickening and internal high density. No invasive features surrounding the left maxillary sinus.  No acute fracture destructive process.  Orbits: No acute abnormality.  Brain: No evidence of acute abnormality, such as acute infarction, hemorrhage, hydrocephalus, or mass lesion/mass effect. Remote appearing small vessel infarct in the mid left cerebellum. Chronic vessel disease with pattern similar to 2008. Ischemic gliosis is most notable in the internal capsule and corona radiata on the left.  IMPRESSION: 1. No acute intracranial findings. 2. Chronic ischemic injuries described above. 3. Chronically obstructed left maxillary sinus.   Electronically Signed   By: Jorje Guild M.D.   On: 07/13/2014 00:41   Ct Angio Neck W/cm &/or Wo/cm  07/13/2014   CLINICAL DATA:  Unable to move right side, acute stroke.  EXAM: CT ANGIOGRAPHY HEAD AND NECK  TECHNIQUE: Multidetector CT imaging of the head and neck was performed using the standard protocol during bolus administration of intravenous contrast. Multiplanar CT image reconstructions and MIPs were obtained to evaluate the vascular anatomy. Carotid stenosis measurements (when applicable) are obtained utilizing NASCET criteria, using the distal internal carotid diameter as the denominator.  CONTRAST:  52mL OMNIPAQUE IOHEXOL 350 MG/ML SOLN  COMPARISON:  CT of the head July 12, 2014 at 2335 hr  FINDINGS: CTA HEAD FINDINGS  Anterior circulation: Petrous, cavernous internal carotid arteries are patent. Severe calcific atherosclerosis of the carotid siphons with at least 50% narrowing and irregularity bilaterally. High-grade stenosis of the right carotid terminus. Diminutive right A1 segment with robust anterior communicating artery and left A1 segment. Anterior and middle cerebral arteries appear widely  patent.  Posterior circulation: The left  vertebral artery predominantly terminates in the left posterior-inferior cerebellar artery. Diminutive though patent bilateral vertebral arteries. Basilar artery is patent with may normal main branch vessels. Robust left posterior communicating artery. Normal appearance of the posterior cerebral arteries.  Remote left cerebellar infarct. No midline shift or mass effect. No hydrocephalus. No abnormal enhancement.  Review of the MIP images confirms the above findings.  CTA NECK FINDINGS  Normal appearance of the thoracic arch, mild calcific atherosclerosis. Aberrant right subclavian artery, coursing posterior to the trachea and esophagus. Approximately 75% stenosis of the right subclavian artery due to intimal thickening focally, with calcific atherosclerosis and intimal thickening, the vessel remains patent. At least 50% stenosis of the left subclavian artery approximately 27 mm above the origin.  Right Common carotid artery is the first vessel arising from the aortic arch, with at least 50% narrowing. At least 50% narrowing of the origin of the left Common carotid artery due to intimal thickening. Common carotid arteries are normal in course and caliber, widely patent. Mild intimal irregularity bilaterally consistent with atherosclerosis. Intimal thickening calcific atherosclerosis of the carotid bulbs, soft tissue about the right carotid bifurcation with surgical clips suggest prior carotid endarterectomy. Calcific atherosclerosis narrows the origin of the left internal carotid artery to 2 mm, resulting in approximately 66% stenosis by NASCET criteria.  High-grade stenosis the origin of the right vertebral artery, at which arises from the upper right subclavian artery at of locus of intimal thickening and calcification. Occluded origin of the left vertebral artery, with reconstitution at the left foraminal segment. The ready irregular left vertebral artery foraminal segment  to level of C2-3.  No hemodynamically significant stenosis by NASCET criteria. No dissection, no pseudoaneurysm. No abnormal luminal irregularity. No contrast extravasation.  15 mm right thyroid nodule. No acute osseous process though bone windows have not been submitted. Included view of the lungs demonstrates interstitial prominence and ground-glass opacities suggesting pulmonary edema.  Review of the MIP images confirms the above findings.  IMPRESSION: CTA neck: Aberrant right subclavian artery with at least 75% stenosis of the right subclavian artery, and 50% stenosis of the left subclavian artery due to calcific atherosclerosis.  Occluded origin left vertebral artery with irregular intraforaminal segment which may reflect chronic dissection, vessel remains patent. High-grade stenosis of the origin of the right vertebral artery.  Status post apparent right carotid endarterectomy. Approximately 66% narrowing of the left internal carotid artery origin by NASCET criteria.  CTA head: Calcific atherosclerosis of the carotid siphons with at least 50% stenosis.  High-grade stenosis of the right carotid terminus. No large vessel occlusion.   Electronically Signed   By: Elon [name]   On: 07/13/2014 03:44   Portable Chest Xray In Am  07/13/2014   CLINICAL DATA:  Assess airspace disease  EXAM: PORTABLE CHEST - 1 VIEW  COMPARISON:  07/12/2014  FINDINGS: Endotracheal tube ends between the clavicular heads and carina. Orogastric tube is coiled in the stomach. Left upper extremity PICC, tip at the upper cavoatrial junction.  Improved pulmonary inflation. There is persistent bibasilar coarse interstitial opacities. Air bronchograms at the left base. No evidence of effusion or pneumothorax. Calcified chronic pancreatitis.  IMPRESSION: 1. Tubes and central line are in good position. 2. Improved pulmonary inflation. Residual basilar opacities could reflect edema or infection.   Electronically Signed   By: Jorje Guild  M.D.   On: 07/13/2014 06:23   Dg Chest Port 1 View  07/12/2014   CLINICAL DATA:  Intubation  EXAM: PORTABLE CHEST -  1 VIEW  COMPARISON:  07/12/2014 at 5:30 a.m.  FINDINGS: New endotracheal tube tip lies at the upper margin of the carina.  Lung volumes are low. Hazy bilateral lung opacity seen earlier is unchanged. No pneumothorax.  New left PICC has its tip projecting in the region of the caval atrial junction.  IMPRESSION: Endotracheal tube tip lies at the level of the upper carina.  Left PICC tip lies at the caval atrial junction.  No change in lung aeration.   Electronically Signed   By: Lajean Manes M.D.   On: 07/12/2014 14:07   Dg Abd Portable 1v  07/13/2014   CLINICAL DATA:  Orogastric tube placement  EXAM: PORTABLE ABDOMEN - 1 VIEW  COMPARISON:  07/07/2014  FINDINGS: The orogastric tube is coiled in the stomach. The tip is in the fundus.  Nonobstructive bowel gas pattern.  Chronic pancreatitis with diffuse parenchymal calcification.  Interstitial opacities in the lower lungs. No evidence of change from yesterday's chest x-ray.  IMPRESSION: 1. Orogastric tube is coiled in the stomach. 2. Nonobstructive bowel gas pattern. 3. Chronic pancreatitis.   Electronically Signed   By: Jorje Guild M.D.   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 female with  with HIV and hepatitis C, infection recently having completed therapy with Harvoni who then developed ischemic foot sp BKA complicated now with ARF, and respiratory failure requiring intubation being treated for healthcare associated pneumonia.  #1 healthcare associated pneumonia:  --Findings a continue her  vancomycin and Zosyn for now, no specific pathogens have been isolated however --if she improves would stop her antiboitics after 7 days  #2 diarrhea: C. difficile PCR is negative.  #3 acute renal failure: Creatinine is improving. Her low phosphorus and her serum along with glucose in the urine is concerning for possible tenotomy or toxicity. Therefore will avoid this drug for now.  #4 HIV: E start her antiretrovirals but will start her on TIVICAY once daily along with Combivir twice daily. Note the TIVICAY can inhibit the secretion of creatinine but is itself not and for toxin. abacavir but given recent controversy about a bottle of beer exposure risk for myocardial infarction I don't think is the best drug in this patient with known ischemic heart disease.      LOS: 10 days   Alcide Evener 07/14/2014, 11:10 AM

## 2014-07-14 NOTE — Progress Notes (Signed)
[facility] Progress Note Patient Name: [name] DOB: 23-Mar-1956 MRN: [id number]  Date of Service  07/14/2014   HPI/Events of Note   DKA management:  AG - 10 Urine ketones - neg Last Insulin gtt rate ~ 1 unit / hour, stopped 2 hours ago K - 5.0   eICU Interventions   Lantus 10 now and qhs D/c Insulin gtt D/c D5+K SSI q4h    Intervention Category Major Interventions: Other:  Jemar Paulsen 07/14/2014, 2:38 AM

## 2014-07-15 ENCOUNTER — Inpatient Hospital Stay (HOSPITAL_COMMUNITY): Payer: Medicaid Other

## 2014-07-15 DIAGNOSIS — I6350 Cerebral infarction due to unspecified occlusion or stenosis of unspecified cerebral artery: Secondary | ICD-10-CM

## 2014-07-15 DIAGNOSIS — J810 Acute pulmonary edema: Secondary | ICD-10-CM

## 2014-07-15 LAB — GLUCOSE, CAPILLARY
GLUCOSE-CAPILLARY: 172 mg/dL — AB (ref 70–99)
GLUCOSE-CAPILLARY: 195 mg/dL — AB (ref 70–99)
GLUCOSE-CAPILLARY: 226 mg/dL — AB (ref 70–99)
Glucose-Capillary: 105 mg/dL — ABNORMAL HIGH (ref 70–99)
Glucose-Capillary: 156 mg/dL — ABNORMAL HIGH (ref 70–99)
Glucose-Capillary: 240 mg/dL — ABNORMAL HIGH (ref 70–99)

## 2014-07-15 LAB — BASIC METABOLIC PANEL
ANION GAP: 9 (ref 5–15)
ANION GAP: 13 (ref 5–15)
Anion gap: 17 — ABNORMAL HIGH (ref 5–15)
BUN: 14 mg/dL (ref 6–23)
BUN: 16 mg/dL (ref 6–23)
BUN: 15 mg/dL (ref 6–23)
CALCIUM: 7.3 mg/dL — AB (ref 8.4–10.5)
CALCIUM: 6.9 mg/dL — AB (ref 8.4–10.5)
CHLORIDE: 120 meq/L — AB (ref 96–112)
CO2: 17 mEq/L — ABNORMAL LOW (ref 19–32)
CO2: 18 mEq/L — ABNORMAL LOW (ref 19–32)
CO2: 17 mEq/L — ABNORMAL LOW (ref 19–32)
CREATININE: 1.42 mg/dL — AB (ref 0.50–1.10)
Calcium: 6.9 mg/dL — ABNORMAL LOW (ref 8.4–10.5)
Chloride: 113 mEq/L — ABNORMAL HIGH (ref 96–112)
Chloride: 117 mEq/L — ABNORMAL HIGH (ref 96–112)
Creatinine, Ser: 1.43 mg/dL — ABNORMAL HIGH (ref 0.50–1.10)
Creatinine, Ser: 1.42 mg/dL — ABNORMAL HIGH (ref 0.50–1.10)
GFR calc Af Amer: 45 mL/min — ABNORMAL LOW (ref >90)
GFR calc Af Amer: 46 mL/min — ABNORMAL LOW (ref >90)
GFR, EST AFRICAN AMERICAN: 46 mL/min — AB (ref >90)
GFR, EST NON AFRICAN AMERICAN: 39 mL/min — AB (ref >90)
GFR, EST NON AFRICAN AMERICAN: 40 mL/min — AB (ref >90)
GFR, EST NON AFRICAN AMERICAN: 40 mL/min — AB (ref >90)
GLUCOSE: 230 mg/dL — AB (ref 70–99)
Glucose, Bld: 221 mg/dL — ABNORMAL HIGH (ref 70–99)
Glucose, Bld: 263 mg/dL — ABNORMAL HIGH (ref 70–99)
Potassium: 2.9 mEq/L — CL (ref 3.7–5.3)
Potassium: 3.4 mEq/L — ABNORMAL LOW (ref 3.7–5.3)
Potassium: 3.5 mEq/L — ABNORMAL LOW (ref 3.7–5.3)
SODIUM: 147 meq/L (ref 137–147)
SODIUM: 147 meq/L (ref 137–147)
Sodium: 147 mEq/L (ref 137–147)

## 2014-07-15 LAB — BLOOD GAS, ARTERIAL
Acid-base deficit: 8.2 mmol/L — ABNORMAL HIGH (ref 0.0–2.0)
Bicarbonate: 16 mEq/L — ABNORMAL LOW (ref 20.0–24.0)
Drawn by: 23604
FIO2: 0.7 %
LHR: 24 {breaths}/min
MECHVT: 500 mL
O2 Saturation: 96.3 %
PEEP/CPAP: 10 cmH2O
PH ART: 7.377 (ref 7.350–7.450)
Patient temperature: 98.5
TCO2: 16.8 mmol/L (ref 0–100)
pCO2 arterial: 27.8 mmHg — ABNORMAL LOW (ref 35.0–45.0)
pO2, Arterial: 90.3 mmHg (ref 80.0–100.0)

## 2014-07-15 LAB — PHOSPHORUS
Phosphorus: 0.8 mg/dL — CL (ref 2.3–4.6)
Phosphorus: 3.2 mg/dL (ref 2.3–4.6)

## 2014-07-15 LAB — CULTURE, RESPIRATORY

## 2014-07-15 LAB — POCT I-STAT 3, ART BLOOD GAS (G3+)
ACID-BASE DEFICIT: 10 mmol/L — AB (ref 0.0–2.0)
ACID-BASE DEFICIT: 9 mmol/L — AB (ref 0.0–2.0)
BICARBONATE: 16.1 meq/L — AB (ref 20.0–24.0)
Bicarbonate: 15.3 mEq/L — ABNORMAL LOW (ref 20.0–24.0)
O2 SAT: 89 %
O2 Saturation: 54 %
PO2 ART: 60 mmHg — AB (ref 80.0–100.0)
Patient temperature: 99
TCO2: 16 mmol/L (ref 0–100)
TCO2: 17 mmol/L (ref 0–100)
pCO2 arterial: 28.5 mmHg — ABNORMAL LOW (ref 35.0–45.0)
pCO2 arterial: 30.8 mmHg — ABNORMAL LOW (ref 35.0–45.0)
pH, Arterial: 7.328 — ABNORMAL LOW (ref 7.350–7.450)
pH, Arterial: 7.338 — ABNORMAL LOW (ref 7.350–7.450)
pO2, Arterial: 30 mmHg — CL (ref 80.0–100.0)

## 2014-07-15 LAB — CBC
HCT: 26 % — ABNORMAL LOW (ref 36.0–46.0)
HEMOGLOBIN: 8.7 g/dL — AB (ref 12.0–15.0)
MCH: 29.3 pg (ref 26.0–34.0)
MCHC: 33.5 g/dL (ref 30.0–36.0)
MCV: 87.5 fL (ref 78.0–100.0)
Platelets: 210 10*3/uL (ref 150–400)
RBC: 2.97 MIL/uL — ABNORMAL LOW (ref 3.87–5.11)
RDW: 17.6 % — ABNORMAL HIGH (ref 11.5–15.5)
WBC: 20.5 10*3/uL — ABNORMAL HIGH (ref 4.0–10.5)

## 2014-07-15 LAB — CULTURE, RESPIRATORY W GRAM STAIN

## 2014-07-15 LAB — LACTIC ACID, PLASMA: LACTIC ACID, VENOUS: 1.5 mmol/L (ref 0.5–2.2)

## 2014-07-15 LAB — MAGNESIUM: Magnesium: 1.9 mg/dL (ref 1.5–2.5)

## 2014-07-15 MED ORDER — ZIDOVUDINE 100 MG PO CAPS
300.0000 mg | ORAL_CAPSULE | Freq: Two times a day (BID) | ORAL | Status: DC
  Administered 2014-07-15 – 2014-07-17 (×5): 300 mg via ORAL
  Filled 2014-07-15 (×7): qty 3

## 2014-07-15 MED ORDER — FENTANYL CITRATE 0.05 MG/ML IJ SOLN
200.0000 ug | Freq: Once | INTRAMUSCULAR | Status: AC
  Administered 2014-07-15: 200 ug via INTRAVENOUS

## 2014-07-15 MED ORDER — FUROSEMIDE 10 MG/ML IJ SOLN
40.0000 mg | INTRAMUSCULAR | Status: AC
  Administered 2014-07-15: 40 mg via INTRAVENOUS
  Filled 2014-07-15: qty 4

## 2014-07-15 MED ORDER — K PHOS MONO-SOD PHOS DI & MONO 155-852-130 MG PO TABS
500.0000 mg | ORAL_TABLET | ORAL | Status: AC
  Administered 2014-07-15: 500 mg via ORAL
  Filled 2014-07-15: qty 2

## 2014-07-15 MED ORDER — POTASSIUM CHLORIDE CRYS ER 20 MEQ PO TBCR
40.0000 meq | EXTENDED_RELEASE_TABLET | Freq: Once | ORAL | Status: AC
  Administered 2014-07-15: 40 meq via ORAL
  Filled 2014-07-15: qty 2

## 2014-07-15 MED ORDER — POTASSIUM PHOSPHATES 15 MMOLE/5ML IV SOLN
40.0000 meq | Freq: Once | INTRAVENOUS | Status: AC
  Administered 2014-07-15: 40 meq via INTRAVENOUS
  Filled 2014-07-15: qty 9.09

## 2014-07-15 MED ORDER — LAMIVUDINE 150 MG PO TABS
150.0000 mg | ORAL_TABLET | Freq: Every day | ORAL | Status: DC
  Administered 2014-07-16 – 2014-07-17 (×2): 150 mg via ORAL
  Filled 2014-07-15 (×3): qty 1

## 2014-07-15 MED ORDER — SODIUM CHLORIDE 0.9 % IV BOLUS (SEPSIS)
750.0000 mL | Freq: Once | INTRAVENOUS | Status: AC
  Administered 2014-07-15: 750 mL via INTRAVENOUS

## 2014-07-15 MED ORDER — FENTANYL CITRATE 0.05 MG/ML IJ SOLN
INTRAMUSCULAR | Status: AC
  Filled 2014-07-15: qty 4

## 2014-07-15 MED ORDER — DEXMEDETOMIDINE HCL IN NACL 400 MCG/100ML IV SOLN
0.4000 ug/kg/h | INTRAVENOUS | Status: DC
  Administered 2014-07-15 – 2014-07-17 (×6): 1.2 ug/kg/h via INTRAVENOUS
  Filled 2014-07-15: qty 200
  Filled 2014-07-15 (×4): qty 100

## 2014-07-15 MED ORDER — POTASSIUM CHLORIDE 10 MEQ/50ML IV SOLN
10.0000 meq | INTRAVENOUS | Status: AC
  Administered 2014-07-15 (×6): 10 meq via INTRAVENOUS
  Filled 2014-07-15: qty 50

## 2014-07-15 NOTE — Progress Notes (Signed)
UR Completed.  [name] [id number] 07/15/2014

## 2014-07-15 NOTE — Progress Notes (Signed)
Respiratory therapy note-Called to room for decreased sp02 70'S, Fio2 increased by RN to 100%. Patient was assessed, suctioned for no secretions, moderate coughing and asynchrony with vent, sedation given by RN. BBS equal and coarse.sp02 currently 96% with Fio2 at 60%.

## 2014-07-15 NOTE — Progress Notes (Signed)
[facility] Progress Note Patient Name: [name] DOB: [birth year] MRN: [id number]  Date of Service  07/15/2014   HPI/Events of Note   Repeat ABG noted. No real change. TV not changed despite order. Persistent hypokalemia. Hypophosphatemia resolved.   eICU Interventions   RT to adjust vent as ordered. Repeat ABG in AM. Replete K, check Mg at next blood draw.     Intervention Category Major Interventions: Acid-Base disturbance - evaluation and management Intermediate Interventions: Electrolyte abnormality - evaluation and management  Danene Montijo R. 07/15/2014, 9:29 PM

## 2014-07-15 NOTE — Progress Notes (Signed)
Patient transported to MRI and back to 2M05 without event. RN at bedside. Vital signs stable at this time. NO complications. RT will continue to monitor.

## 2014-07-15 NOTE — Progress Notes (Signed)
CRITICAL VALUE ALERT  Critical value received:  Phosphorus 0.8  Date of notification:  8/9  Time of notification:  6759  Critical value read back:Yes.    Nurse who received alert:  Beatrix Shipper RN  MD notified (1st page):  Belanger MD at [facility]  Time of first page:  35  MD notified (2nd page):  Time of second page:  Responding MD: Lowry Ram  Time MD responded:  1540

## 2014-07-15 NOTE — Progress Notes (Signed)
[place] Progress Note Patient Name: [name] DOB: Apr 07, 1956 MRN: [id number]  Date of Service  07/15/2014   HPI/Events of Note   Repeated desaturation. Responded to increased PEEP and temporary increased of FiO2 to 100%. No evidence of tube-biting per RN and no elevated airway pressures.   eICU Interventions   Change Vent to PRVC 24, 500, 10, 60%. Repeat ABG. Check X-ray.     Intervention Category Major Interventions: Respiratory failure - evaluation and management  Aaren Krog R. 07/15/2014, 4:11 PM

## 2014-07-15 NOTE — Progress Notes (Signed)
[place] Progress Note Patient Name: [name] DOB: [birth year] MRN: [id number]  Date of Service  07/15/2014   HPI/Events of Note   Repeat ABG noted. Patient significantly hypoxic. X-ray notable for worsening edema. Acidosis    eICU Interventions   Continue RR of 24, decrease TW to 6 cc/kg. Repeat ABG at 8 PM. Diuretics.    Intervention Category Major Interventions: Respiratory failure - evaluation and management  Linsie Lupo R. 07/15/2014, 6:05 PM

## 2014-07-15 NOTE — Progress Notes (Signed)
VASCULAR LAB PRELIMINARY  PRELIMINARY  PRELIMINARY  PRELIMINARY  Bilateral lower extremity venous Dopplers completed.    Preliminary report:  There is no DVT or SVT noted in the bilateral lower extremities.   Eunique Balik, RVT 07/15/2014, 10:27 AM

## 2014-07-15 NOTE — Progress Notes (Signed)
Stroke Team Progress Note   Admit Date: 07/03/2014  LOS: 11 days   HISTORY [name] is an 58 y.o. female with a history of HIV infection, nonischemic cardiomyopathy, peripheral vascular disease, hypercholesterolemia and diabetes mellitus who is 3 days postop right BK amputation, noted to not be moving her right side. She is currently intubated and on mechanical ventilation, but on no sedating medications. She was transferred to the intensive care unit following onset of hypotension, respiratory distress and worsening of metabolic acidosis at around 1 PM on 07/12/2014. She was last seen moving the right arm and hand at around 8 PM tonight. Stat CT scan of her head showed no acute intracranial abnormality. Patient has been on aspirin 81 mg per day. She had a low-grade temp of 100.5. She's currently on Zosyn and vancomycin.  SUBJECTIVE  no family member is at the bedside during am rounds.  Her appears stable.  Able to open eyes on voice and will follow   commands. Left arm spontaneously moving but not right arm or leg.  OBJECTIVE Most recent Vital Signs: Filed Vitals:   07/15/14 0900 07/15/14 1000 07/15/14 1100 07/15/14 1110  BP: 143/78 71/50 148/94 148/94  Pulse: 72 71 77 74  Temp:      TempSrc:      Resp: 29 33 26 31  Height:      Weight:      SpO2: 96% 86% 98% 98%   CBG (last 3)   Recent Labs  07/15/14 0343 07/15/14 0703 07/15/14 1057  GLUCAP 172* 195* 226*     IV Fluid Intake:   . dexmedetomidine 1.2 mcg/kg/hr (07/15/14 1045)  . feeding supplement (GLUCERNA 1.2 CAL) 1,000 mL (07/14/14 2000)  . phenylephrine (NEO-SYNEPHRINE) Adult infusion 15 mcg/min (07/15/14 1125)    Medications: Scheduled:  . antiseptic oral rinse  7 mL Mouth Rinse QID  . aspirin EC  81 mg Oral Daily  . chlorhexidine  15 mL Mouth Rinse BID  . dolutegravir  50 mg Oral Daily  . heparin  5,000 Units Subcutaneous 3 times per day  . insulin aspart  0-15 Units Subcutaneous 6 times per day  . insulin  glargine  10 Units Subcutaneous QHS  . lamiVUDine-zidovudine  1 tablet Oral BID  . multivitamin with minerals  1 tablet Per Tube Daily  . pantoprazole sodium  40 mg Per Tube Q24H  . piperacillin-tazobactam (ZOSYN)  IV  3.375 g Intravenous Q8H  . potassium chloride  10 mEq Intravenous Q1 Hr x 6  . sodium chloride  10-40 mL Intracatheter Q12H  . vancomycin  1,000 mg Intravenous Q24H   Continuous Infusions:  . dexmedetomidine 1.2 mcg/kg/hr (07/15/14 1045)  . feeding supplement (GLUCERNA 1.2 CAL) 1,000 mL (07/14/14 2000)  . phenylephrine (NEO-SYNEPHRINE) Adult infusion 15 mcg/min (07/15/14 1125)     Diet:  NPO  Activity:  Bedrest DVT Prophylaxis:  Heparin subq  CLINICALLY SIGNIFICANT STUDIES Basic Metabolic Panel:  Recent Labs Lab 07/12/14 1107  07/13/14 0835  07/14/14 0137 07/15/14 0505  NA  --   < > 156*  < > 138 147  K  --   < > 2.3*  < > 5.0 2.9*  CL  --   < > 114*  < > 109 113*  CO2  --   < > 24  < > 19 17*  GLUCOSE  --   < > 172*  < > 465* 230*  BUN  --   < > 19  < > 11  14  CREATININE  --   < > 1.61*  < > 1.29* 1.43*  CALCIUM  --   < > 7.5*  < > 7.0* 7.3*  MG 2.6*  --  1.9  --   --   --   PHOS  --   --  <0.5*  --   --   --   < > = values in this interval not displayed. Liver Function Tests:   Recent Labs Lab 07/13/14 0835 07/14/14 0137  AST 40* 40*  ALT 18 18  ALKPHOS 141* 122*  BILITOT 0.5 0.5  PROT 6.6 5.7*  ALBUMIN 2.1* 1.8*   CBC:   Recent Labs Lab 07/12/14 0842 07/14/14 0137 07/15/14 0505  WBC 26.9* 20.4* 20.5*  NEUTROABS 24.5*  --   --   HGB 9.6* 8.3* 8.7*  HCT 29.8* 25.1* 26.0*  MCV 93.4 90.0 87.5  PLT 343 199 210   Coagulation: No results found for this basename: LABPROT, INR,  in the last 168 hours Cardiac Enzymes:   Recent Labs Lab 07/12/14 2021 07/13/14 0221 07/13/14 1200 07/14/14 0930  CKTOTAL  --   --  541*  --   CKMB  --   --  2.7  --   TROPONINI 0.37* 0.99*  --  <0.30   Urinalysis:   Recent Labs Lab 07/10/14 1358  07/12/14 1401 07/14/14 0029  COLORURINE YELLOW YELLOW  --   LABSPEC 1.015 1.012  --   PHURINE 6.0 6.5  --   GLUCOSEU 500* 500*  --   HGBUR LARGE* LARGE*  --   BILIRUBINUR NEGATIVE NEGATIVE  --   KETONESUR >80* >80* NEGATIVE  PROTEINUR 100* NEGATIVE  --   UROBILINOGEN 0.2 0.2  --   NITRITE NEGATIVE NEGATIVE  --   LEUKOCYTESUR NEGATIVE NEGATIVE  --    Lipid Panel    Component Value Date/Time   CHOL 83 07/14/2014 0137   TRIG 156* 07/14/2014 0137   HDL 13* 07/14/2014 0137   CHOLHDL 6.4 07/14/2014 0137   VLDL 31 07/14/2014 0137   LDLCALC 39 07/14/2014 0137   HgbA1C .resu Lab Results  Component Value Date   HGBA1C 6.6* 07/14/2014   TSH  Lab Results  Component Value Date   TSH 1.199 04/18/2014   Urine Drug Screen:     Component Value Date/Time   LABOPIA POSITIVE* 02/21/2013 0151   COCAINSCRNUR NONE DETECTED 02/21/2013 0151   LABBENZ NONE DETECTED 02/21/2013 0151   AMPHETMU NONE DETECTED 02/21/2013 0151   THCU NONE DETECTED 02/21/2013 0151   LABBARB NONE DETECTED 02/21/2013 0151    Alcohol Level: No results found for this basename: ETH,  in the last 168 hours  I personally reviewed the image studies below and agree with the interpretation.  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  Occluded origin left vertebral artery with irregular intraforaminal segment which may reflect chronic dissection, vessel remains patent. High-grade stenosis of the origin of the right vertebral artery.  Status post apparent right carotid endarterectomy. Approximately 66% narrowing of the left internal carotid artery origin by NASCET criteria.  CTA head: Calcific atherosclerosis of the carotid siphons with at least 50% stenosis.  High-grade stenosis of the right carotid terminus. No large vessel occlusion.     Ct Head Wo  Contrast  07/13/2014    IMPRESSION: 1.  No acute intracranial findings. 2. Chronic ischemic injuries described above. 3. Chronically obstructed left maxillary sinus.      Chest Xray  07/13/2014    IMPRESSION: 1. Tubes and central line are in good position. 2. Improved pulmonary inflation. Residual basilar opacities could reflect edema or infection.     07/12/2014   IMPRESSION: Endotracheal tube tip lies at the level of the upper carina.  Left PICC tip lies at the caval atrial junction.  No change in lung aeration.   Dg Chest Port 1 View  07/12/2014   IMPRESSION: 1. Lower lung volumes. 2. Persistent bibasilar opacities which could reflect atelectasis or pneumonia. 3. Probable superimposed pulmonary edema.     Dg Abd   07/13/2014  IMPRESSION: 1. Orogastric tube is coiled in the stomach. 2. Nonobstructive bowel gas pattern. 3. Chronic pancreatitis.    MRI of the brain  pending  2D Echocardiogram  07/12/14 - Left ventricle: The cavity size was normal. Systolic function was vigorous. The estimated ejection fraction was in the range of 65% to 70%. Wall motion was normal; there were no regional wall motion abnormalities. Doppler parameters are consistent with abnormal left ventricular relaxation (grade 1 diastolic dysfunction).  EKG  06/11/2014 sinus brady. For complete results please see formal report.  EEG :07/13/14  mild generalized slowing. No seizures PT/OT Recommendations pending  Physical Exam Temp:  [98.8 F (37.1 C)-100.4 F (38 C)] 98.9 F (37.2 C) (08/09 0805) Pulse Rate:  [69-92] 74 (08/09 1110) Resp:  [0-33] 31 (08/09 1110) BP: (71-172)/(50-96) 148/94 mmHg (08/09 1110) SpO2:  [86 %-100 %] 98 % (08/09 1110) FiO2 (%):  [40 %-60 %] 60 % (08/09 1110) Weight:  [67.9 kg (149 lb 11.1 oz)] 67.9 kg (149 lb 11.1 oz) (08/09 0337)  General - thin, well developed, intubated not on sedation, open eyes and will following commands.  Ophthalmologic - not cooperative on exam.  Cardiovascular -  Regular rhythm with tachycardia.  Lung - decreased breath sound bilaterally  Extremities - bilateral BK amputation  Neuro- Awake open eyes and consistently following commands, intubated without sedation, blinking to visual threat bilaterally, pupil 33mm bilaterally, reactive to light, spontaneously eye movement, but more middle position preference, corneal present, cough and gag present, left UE spontaneously movement, LLE against gravity on pain stimulation, RUE and RLE mild weakness unable to hold up against gravity but will withdraw to pain ASSESSMENT Ms. Margaree Sandhu Kroboth is a 58 y.o. female with PMH of HIV, nonischemic cardiomyopathy, PVD, HLD and DM with previous left BK amputation admitted for right BK amputation. Pt condition deteriorated yesterday due to hypotension and respiratory distress which required intubation and transfer to ICU. She was found to not moving right UE and LE which triggered for neurology consult. By reviewing her record, it seemed that she had CUS 10/2013 showed right s/p CEA but left ICA 60-79% stenosis. Her CTA overnight also confirmed left ICA 65-70% stenosis with diffuse multivessel severe asthrosclerotic changes. 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 She had multiple stroke risk factors:  1. Hypotension - her BP overnight was low and even this morning better in 120`s with levophed. With the tight stenosis on the left ICA, hypotension could impair the left hemisphere brain perfusion which leads to watershed infarct. MRI brain may give a further hint on the distribution of infarct and BP management is the key. Please avoid hypotension.  2. Artery to artery emboli - pt has tight left ICA stenosis and diffuse multivessel  severe asthrosclerotic changes. A-A emboli is possible from left ICA. MRI brain recommended to see if stroke is within a large vessel territory. Future CEA may be considered.  3. cardioembolic - pt has history of HIV,  with fever, although 07/10/14 blood culture so far NGTD, but endocarditis not able to rule out. Recommend to repeat blood culture, and consider TEE once pt stable. ID is on board.  4. Paradoxical emboli - pt has right BK amputation 3 days ago, LE DVT needs to be ruled out. If pt also has PFO, then paradoxical emboli is in the differential. Pt has respiratory distress, may also need to rule out PE. Recommend UE and LE U/S to rule out DVT.   5. Small vessel event - pt has stroke risk factors such as DM, HLD, HIV with diffuse asthrosclerotic changes, thrombotic events are certainly possible. MRI brain and risk factor modification.   6. Beside consideration of stroke, she has hx of HIV with mental status changes and fever, we have low threshold to have LP to rule out CNS infection.   7. Subclinical seizure -  EEG done shows no evidence of seizures.  TREATMENT/PLAN   MRI brain without contrast once stable preferably prior to extubation  UE and LE venous doppler to rule out DVT  May consider TEE once stable incase MRI shows bilateral embolic infarcts  BP management, please avoid hypotension, goal SBP 110-160  Consider change ASA 81 mg to 325mg  if not contraindicated  Continue antibiotics as per ID  GI and DVT prophylaxis  Will follow    Antony Contras, MD  07/15/2014 11:40 AM   To contact Stroke Continuity provider, please refer to [email or web address] After hours, contact General Neurology

## 2014-07-15 NOTE — Progress Notes (Signed)
[name] for Infectious Disease  Day #6 vancomycin  Day #6 Zosyn  Subjective: intubated   Antibiotics:  Anti-infectives   Start     Dose/Rate Route Frequency Ordered Stop   07/16/14 1000  lamiVUDine (EPIVIR) tablet 150 mg     150 mg Oral Daily 07/15/14 1233     07/15/14 2200  zidovudine (RETROVIR) capsule 300 mg     300 mg Oral Every 12 hours 07/15/14 1233     07/15/14 0800  vancomycin (VANCOCIN) IVPB 1000 mg/200 mL premix     1,000 mg 200 mL/hr over 60 Minutes Intravenous Every 24 hours 07/14/14 1310     07/14/14 1300  lamiVUDine-zidovudine (COMBIVIR) 150-300 MG per tablet 1 tablet  Status:  Discontinued     1 tablet Oral 2 times daily 07/14/14 1109 07/15/14 1231   07/14/14 1300  dolutegravir (TIVICAY) tablet 50 mg     50 mg Oral Daily 07/14/14 1109     07/10/14 1200  piperacillin-tazobactam (ZOSYN) IVPB 3.375 g     3.375 g 12.5 mL/hr over 240 Minutes Intravenous Every 8 hours 07/10/14 0844     07/10/14 1000  vancomycin (VANCOCIN) IVPB 750 mg/150 ml premix  Status:  Discontinued     750 mg 150 mL/hr over 60 Minutes Intravenous Every 24 hours 07/10/14 0855 07/14/14 1310   07/19/2014 2200  cefUROXime (ZINACEF) 1.5 g in dextrose 5 % 50 mL IVPB     1.5 g 100 mL/hr over 30 Minutes Intravenous Every 12 hours 07/27/2014 1423 07/10/14 1123   07/15/2014 0600  [MAR Hold]  cefUROXime (ZINACEF) 1.5 g in dextrose 5 % 50 mL IVPB     (On MAR Hold since 07/21/2014 0923)   1.5 g 100 mL/hr over 30 Minutes Intravenous On call to O.R. 07/06/14 1047 07/31/2014 1034   07/05/14 1000  emtricitabine-tenofovir (TRUVADA) 200-300 MG per tablet 1 tablet  Status:  Discontinued     1 tablet Oral Daily 06/18/2014 1616 06/19/2014 1839   07/01/2014 1700  dolutegravir (TIVICAY) tablet 50 mg  Status:  Discontinued     50 mg Oral Daily 06/29/2014 1616 06/21/2014 1839      Medications: Scheduled Meds: . antiseptic oral rinse  7 mL Mouth Rinse QID  . aspirin EC  81 mg Oral Daily  . chlorhexidine  15 mL Mouth Rinse  BID  . dolutegravir  50 mg Oral Daily  . heparin  5,000 Units Subcutaneous 3 times per day  . insulin aspart  0-15 Units Subcutaneous 6 times per day  . insulin glargine  10 Units Subcutaneous QHS  . [START ON 07/16/2014] lamiVUDine  150 mg Oral Daily  . multivitamin with minerals  1 tablet Per Tube Daily  . pantoprazole sodium  40 mg Per Tube Q24H  . piperacillin-tazobactam (ZOSYN)  IV  3.375 g Intravenous Q8H  . potassium phosphate IVPB (mEq)  40 mEq Intravenous Once  . sodium chloride  10-40 mL Intracatheter Q12H  . vancomycin  1,000 mg Intravenous Q24H  . zidovudine  300 mg Oral Q12H   Continuous Infusions: . dexmedetomidine 1.2 mcg/kg/hr (07/15/14 2012)  . feeding supplement (GLUCERNA 1.2 CAL) 1,000 mL (07/14/14 2000)  . phenylephrine (NEO-SYNEPHRINE) Adult infusion 5 mcg/min (07/15/14 2236)   PRN Meds:.acetaminophen (TYLENOL) oral liquid 160 mg/5 mL, albuterol, fentaNYL, fentaNYL, sodium chloride    Objective: Weight change: 13 lb 14.2 oz (6.3 kg)  Intake/Output Summary (Last 24 hours) at 07/15/14 2239 Last data filed at 07/15/14 2200  Gross per 24 hour  Intake 1905.68 ml  Output   2185 ml  Net -279.32 ml   Blood pressure 99/70, pulse 76, temperature 98.5 F (36.9 C), temperature source Oral, resp. rate 32, height 5\' 4"  (1.626 m), weight 149 lb 11.1 oz (67.9 kg), SpO2 100.00%. Temp:  [98.5 F (36.9 C)-99.1 F (37.3 C)] 98.5 F (36.9 C) (08/09 2007) Pulse Rate:  [69-84] 76 (08/09 2200) Resp:  [0-35] 32 (08/09 2200) BP: (71-158)/(49-94) 99/70 mmHg (08/09 2200) SpO2:  [83 %-100 %] 100 % (08/09 2200) FiO2 (%):  [40 %-70 %] 70 % (08/09 2024) Weight:  [149 lb 11.1 oz (67.9 kg)] 149 lb 11.1 oz (67.9 kg) (08/09 3976)  Physical Exam: General more drowsy today HEENT: anicteric sclera, pupils reactive to light and accommodation, EOMI CVS regular rate, normal r,  no murmur rubs or gallops Chest: clear to auscultation bilaterally, no wheezing, rales or rhonchi Abdomen: soft  nontender, nondistended, normal bowel sounds, Extremities: 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 clean  Neuro: nonfocal  CBC:  Recent Labs Lab 07/11/14 0330 07/12/14 0407 07/12/14 0842 07/14/14 0137 07/15/14 0505  HGB 9.7* 9.5* 9.6* 8.3* 8.7*  HCT 30.2* 29.5* 29.8* 25.1* 26.0*  PLT 253 278 343 199 210     BMET  Recent Labs  07/15/14 1300 07/15/14 2000  NA 147 147  K 3.4* 3.5*  CL 120* 117*  CO2 18* 17*  GLUCOSE 221* 263*  BUN 16 15  CREATININE 1.42* 1.42*  CALCIUM 6.9* 6.9*     Liver Panel   Recent Labs  07/13/14 0835 07/14/14 0137  PROT 6.6 5.7*  ALBUMIN 2.1* 1.8*  AST 40* 40*  ALT 18 18  ALKPHOS 141* 122*  BILITOT 0.5 0.5       Sedimentation Rate No results found for this basename: ESRSEDRATE,  in the last 72 hours C-Reactive Protein No results found for this basename: CRP,  in the last 72 hours  Micro Results: Recent Results (from the past 720 hour(s))  CULTURE, BLOOD (ROUTINE X 2)     Status: None   Collection Time    06/22/2014  2:40 PM      Result Value Ref Range Status   Specimen Description BLOOD LEFT HAND   Final   Special Requests BOTTLES DRAWN AEROBIC AND ANAEROBIC 2CC   Final   Culture  Setup Time     Final   Value: 07/03/2014 21:54     Performed at [facility]   Culture     Final   Value: NO GROWTH 5 DAYS     Performed at [facility]   Report Status 07/10/2014 FINAL   Final  MRSA PCR SCREENING     Status: Abnormal   Collection Time    06/07/2014  4:46 PM      Result Value Ref Range Status   MRSA by PCR POSITIVE (*) NEGATIVE Final   Comment:            The GeneXpert MRSA Assay (FDA     approved for NASAL specimens     only), is one component of a     comprehensive MRSA colonization     surveillance program. It is not     intended to diagnose MRSA     infection nor to guide or     monitor treatment for     MRSA infections.     RESULT CALLED TO, READ BACK BY AND VERIFIED WITH:     Durenda Age RN 19:05 06/13/2014 ([email or web address])    CULTURE, BLOOD (  ROUTINE X 2)     Status: None   Collection Time    06/09/2014  6:12 PM      Result Value Ref Range Status   Specimen Description BLOOD HAND RIGHT   Final   Special Requests BOTTLES DRAWN AEROBIC AND ANAEROBIC 2CC   Final   Culture  Setup Time     Final   Value: 07/05/2014 00:36     Performed at [facility]   Culture     Final   Value: NO GROWTH 5 DAYS     Performed at [facility]   Report Status 07/11/2014 FINAL   Final  URINE CULTURE     Status: None   Collection Time    06/11/2014 10:40 PM      Result Value Ref Range Status   Specimen Description URINE, RANDOM   Final   Special Requests NONE   Final   Culture  Setup Time     Final   Value: 06/23/2014 22:57     Performed at [facility]     Final   Value: NO GROWTH     Performed at [facility]   Culture     Final   Value: NO GROWTH     Performed at [facility]   Report Status 07/05/2014 FINAL   Final  CULTURE, BLOOD (ROUTINE X 2)     Status: None   Collection Time    07/10/14  9:20 AM      Result Value Ref Range Status   Specimen Description BLOOD RIGHT ARM   Final   Special Requests BOTTLES DRAWN AEROBIC ONLY 5CC   Final   Culture  Setup Time     Final   Value: 07/10/2014 15:54     Performed at [facility]   Culture     Final   Value:        BLOOD CULTURE RECEIVED NO GROWTH TO DATE CULTURE WILL BE HELD FOR 5 DAYS BEFORE ISSUING A FINAL NEGATIVE REPORT     Performed at [facility]   Report Status PENDING   Incomplete  CULTURE, BLOOD (ROUTINE X 2)     Status: None   Collection Time    07/10/14  9:31 AM      Result Value Ref Range Status   Specimen Description BLOOD LEFT HAND   Final   Special Requests BOTTLES DRAWN AEROBIC AND ANAEROBIC 10CC   Final   Culture  Setup Time     Final   Value: 07/10/2014 15:54     Performed at [facility]   Culture     Final   Value:        BLOOD CULTURE RECEIVED NO GROWTH TO DATE  CULTURE WILL BE HELD FOR 5 DAYS BEFORE ISSUING A FINAL NEGATIVE REPORT     Performed at [facility]   Report Status PENDING   Incomplete  MRSA PCR SCREENING     Status: None   Collection Time    07/10/14  4:45 PM      Result Value Ref Range Status   MRSA by PCR NEGATIVE  NEGATIVE Final   Comment:            The GeneXpert MRSA Assay (FDA     approved for NASAL specimens     only), is one component of a     comprehensive MRSA colonization     surveillance program. It is  not     intended to diagnose MRSA     infection nor to guide or     monitor treatment for     MRSA infections.  URINE CULTURE     Status: None   Collection Time    07/12/14  2:01 PM      Result Value Ref Range Status   Specimen Description URINE, CATHETERIZED   Final   Special Requests NONE   Final   Culture  Setup Time     Final   Value: 07/12/2014 15:20     Performed at [facility]     Final   Value: NO GROWTH     Performed at [facility]   Culture     Final   Value: NO GROWTH     Performed at [facility]   Report Status 07/13/2014 FINAL   Final  CULTURE, RESPIRATORY (NON-EXPECTORATED)     Status: None   Collection Time    07/12/14  4:15 PM      Result Value Ref Range Status   Specimen Description TRACHEAL ASPIRATE   Final   Special Requests Immunocompromised   Final   Gram Stain     Final   Value: MODERATE WBC PRESENT,BOTH PMN AND MONONUCLEAR     FEW SQUAMOUS EPITHELIAL CELLS PRESENT     RARE GRAM POSITIVE COCCI     IN PAIRS RARE GRAM VARIABLE ROD     Performed at [facility]   Culture     Final   Value: Non-Pathogenic Oropharyngeal-type Flora Isolated.     Performed at [facility]   Report Status 07/15/2014 FINAL   Final  CULTURE, BLOOD (ROUTINE X 2)     Status: None   Collection Time    07/13/14  7:03 PM      Result Value Ref Range Status   Specimen Description BLOOD RIGHT HAND   Final   Special Requests     Final   Value:  BOTTLES DRAWN AEROBIC AND ANAEROBIC 10CC AER,5CC ANA   Culture  Setup Time     Final   Value: 07/14/2014 00:40     Performed at [facility]   Culture     Final   Value:        BLOOD CULTURE RECEIVED NO GROWTH TO DATE CULTURE WILL BE HELD FOR 5 DAYS BEFORE ISSUING A FINAL NEGATIVE REPORT     Performed at [facility]   Report Status PENDING   Incomplete  CULTURE, BLOOD (ROUTINE X 2)     Status: None   Collection Time    07/13/14  7:15 PM      Result Value Ref Range Status   Specimen Description BLOOD RIGHT HAND   Final   Special Requests BOTTLES DRAWN AEROBIC ONLY 8CC   Final   Culture  Setup Time     Final   Value: 07/14/2014 00:40     Performed at [facility]   Culture     Final   Value:        BLOOD CULTURE RECEIVED NO GROWTH TO DATE CULTURE WILL BE HELD FOR 5 DAYS BEFORE ISSUING A FINAL NEGATIVE REPORT     Performed at [facility]   Report Status PENDING   Incomplete  CLOSTRIDIUM DIFFICILE BY PCR     Status: None   Collection Time    07/14/14 12:29 AM      Result Value Ref  Range Status   C difficile by pcr NEGATIVE  NEGATIVE Final    Studies/Results: Mr Virgel Paling Wo Contrast  07/15/2014   CLINICAL DATA:  58 year old female with history of HIV, cardiomyopathy, diabetes recently status post right below the knee amputation with decreased right side movement. Agitation. Initial encounter.  EXAM: MRI HEAD WITHOUT CONTRAST  MRA HEAD WITHOUT CONTRAST  TECHNIQUE: Multiplanar, multiecho pulse sequences of the brain and surrounding structures were obtained without intravenous contrast. Angiographic images of the head were obtained using MRA technique without contrast.  COMPARISON:  Head and neck CTA 07/13/2014 and earlier.  FINDINGS: MRI HEAD FINDINGS  No restricted diffusion or evidence of acute infarction. Major intracranial vascular flow voids are stable compared to the recent CTA findings, with poor flow or occlusion of the distal left vertebral artery  (series 6, image 5).  Small chronic infarct in the left cerebellar hemisphere. T2 and FLAIR hyperintensity with volume loss in the left anterior corona radiata tracking to the caudate nucleus compatible with chronic supratentorial lacunar infarct. No supratentorial cortical encephalomalacia identified. There is Wallerian degeneration evident at the left midbrain.  No restricted diffusion to suggest acute infarction. No midline shift, mass effect, evidence of mass lesion, ventriculomegaly, extra-axial collection or acute intracranial hemorrhage. Cervicomedullary junction and pituitary are within normal limits. Negative visualized cervical spine except for C5-C6 disc and endplate degeneration. Bone marrow signal is within normal limits. Visible internal auditory structures appear normal. Left maxillary sinus inflammation. Postoperative changes to both globes. Intubated. Visualized scalp soft tissues are within normal limits.  MRA HEAD FINDINGS  There is antegrade flow signal detected in the distal left vertebral artery which functionally terminates in PICA. This despite the coarse calcification of the left V4 segment demonstrated on the comparison. Antegrade flow in the distal right vertebral artery without stenosis. Normal right PICA. Basilar artery is patent without stenosis. SCA and PCA origins are within normal limits. Posterior communicating arteries are present. Bilateral PCA branches are within normal limits.  Antegrade flow in both ICA siphons. Mild right and moderate left ICA siphon stenosis in the cavernous and anterior genu segments (left side stenosis series 506, image 5). Carotid termini remain patent. Posterior communicating artery origins are within normal limits.  Normal MCA and left ACA origins. Dominant left A1 segment. Anterior communicating artery and visualized bilateral ACA branches are within normal limits. Visualized bilateral MCA branches are within normal limits.  IMPRESSION: 1.  No acute  intracranial abnormality. 2. Chronic ischemic disease in the left basal ganglia, adjacent deep white matter capsules, and the left cerebellum. 3. Intracranial MRA congruent with the recent CTA, demonstrating distal left vertebral artery and bilateral ICA siphon atherosclerosis and stenosis, but no major circle of Willis branch occlusion.   Electronically Signed   By: Lars Pinks M.D.   On: 07/15/2014 14:59   Mr Brain Wo Contrast  07/15/2014   CLINICAL DATA:  58 year old female with history of HIV, cardiomyopathy, diabetes recently status post right below the knee amputation with decreased right side movement. Agitation. Initial encounter.  EXAM: MRI HEAD WITHOUT CONTRAST  MRA HEAD WITHOUT CONTRAST  TECHNIQUE: Multiplanar, multiecho pulse sequences of the brain and surrounding structures were obtained without intravenous contrast. Angiographic images of the head were obtained using MRA technique without contrast.  COMPARISON:  Head and neck CTA 07/13/2014 and earlier.  FINDINGS: MRI HEAD FINDINGS  No restricted diffusion or evidence of acute infarction. Major intracranial vascular flow voids are stable compared to the recent CTA findings, with poor  flow or occlusion of the distal left vertebral artery (series 6, image 5).  Small chronic infarct in the left cerebellar hemisphere. T2 and FLAIR hyperintensity with volume loss in the left anterior corona radiata tracking to the caudate nucleus compatible with chronic supratentorial lacunar infarct. No supratentorial cortical encephalomalacia identified. There is Wallerian degeneration evident at the left midbrain.  No restricted diffusion to suggest acute infarction. No midline shift, mass effect, evidence of mass lesion, ventriculomegaly, extra-axial collection or acute intracranial hemorrhage. Cervicomedullary junction and pituitary are within normal limits. Negative visualized cervical spine except for C5-C6 disc and endplate degeneration. Bone marrow signal is within  normal limits. Visible internal auditory structures appear normal. Left maxillary sinus inflammation. Postoperative changes to both globes. Intubated. Visualized scalp soft tissues are within normal limits.  MRA HEAD FINDINGS  There is antegrade flow signal detected in the distal left vertebral artery which functionally terminates in PICA. This despite the coarse calcification of the left V4 segment demonstrated on the comparison. Antegrade flow in the distal right vertebral artery without stenosis. Normal right PICA. Basilar artery is patent without stenosis. SCA and PCA origins are within normal limits. Posterior communicating arteries are present. Bilateral PCA branches are within normal limits.  Antegrade flow in both ICA siphons. Mild right and moderate left ICA siphon stenosis in the cavernous and anterior genu segments (left side stenosis series 506, image 5). Carotid termini remain patent. Posterior communicating artery origins are within normal limits.  Normal MCA and left ACA origins. Dominant left A1 segment. Anterior communicating artery and visualized bilateral ACA branches are within normal limits. Visualized bilateral MCA branches are within normal limits.  IMPRESSION: 1.  No acute intracranial abnormality. 2. Chronic ischemic disease in the left basal ganglia, adjacent deep white matter capsules, and the left cerebellum. 3. Intracranial MRA congruent with the recent CTA, demonstrating distal left vertebral artery and bilateral ICA siphon atherosclerosis and stenosis, but no major circle of Willis branch occlusion.   Electronically Signed   By: Lars Pinks M.D.   On: 07/15/2014 14:59   Dg Chest Port 1 View  07/15/2014   CLINICAL DATA:  Acute hypoxia.  EXAM: PORTABLE CHEST - 1 VIEW  COMPARISON:  07/15/2014  FINDINGS: Endotracheal tube is in place 4.8 cm above carina. Nasogastric tube has been placed, tip coiled and overlying the region of the gastric fundus. Left-sided PICC line tip overlies the level  of the superior vena cava.  The heart is enlarged. There is bilateral interstitial pulmonary edema slightly increased over recent studies. No focal consolidations or pleural effusions are identified.  IMPRESSION: 1. Stable lines and tubes. 2. Increased interstitial edema.   Electronically Signed   By: Shon Hale M.D.   On: 07/15/2014 16:44   Dg Chest Port 1 View  07/15/2014   CLINICAL DATA:  Respiratory failure.  EXAM: PORTABLE CHEST - 1 VIEW  COMPARISON:  07/13/2014  FINDINGS: Endotracheal tube tip is approximately 2.5 cm above the carina. PICC line positioning stable in the lower SVC. Nasogastric tube extends into the stomach. Lungs show mild bibasilar atelectasis. There is no evidence of pulmonary edema, consolidation, pneumothorax or pleural fluid. The heart size and mediastinal contours are normal.  IMPRESSION: Mild bibasilar atelectasis.   Electronically Signed   By: Aletta Edouard M.D.   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 female with  with HIV and hepatitis C, infection recently having completed therapy with Harvoni who then developed ischemic foot sp BKA complicated now with ARF, and respiratory failure requiring intubation being treated for healthcare associated pneumonia.  #1 Healthcare associated pneumonia: She is having trouble with need for higher FI02   --WOULD CONSIDER FORMAL BRONCHOSCOPY FOR CULTURES AND TO VIEW AIRWAYS  --FOR NOW continue her vancomycin and Zosyn for now,  #2 diarrhea: C. difficile PCR is negative.  #3 acute renal failure: Creatinine WAS improving. Slightly worse again today.  Her low phosphorus and her serum along  with glucose in the urine is concerning for possible tenotomy or toxicity. Therefore will avoid this drug for now.  #4 HIV: RE start her antiretrovirals but will start her on TIVICAY once daily along Renally dosed 3TC and AZT  Dr. Megan Salon back tomorrow.      LOS: 11 days   Alcide Evener 07/15/2014, 10:39 PM

## 2014-07-15 NOTE — Progress Notes (Signed)
PULMONARY / CRITICAL CARE MEDICINE   Name: [name] MRN: [id number] DOB: 1956-01-19    ADMISSION DATE:  06/30/2014 CONSULTATION DATE:  07/12/2014  CHIEF COMPLAINT: Hypotension, respiratory distress   INITIAL PRESENTATION: 58 yo HIV / HCV + with DM and prior left AKA who initially presented on 7/29 with right foot pain. 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 On 8/4 developed metabolic acidosis. Acute encephalopathy, leukocytosis and CXR suggestive of PNA. She was started on Abx for HCAP starting 8/4. On 8/6, developed worsening acidosis acidosis (ABG 7.09 / 17 / 71), hypotension and respiratory distress required emergent intubation by PCCM and transfer to ICU.   STUDIES:  8/5 Echo >>> EF 65-70%, no RWMA.   SIGNIFICANT EVENTS:  7/29 admitted for right foot pain  8/3 underwent right BKA  8/6 required emergent intubation, transfer to ICU  8/8 off insulin drip  SUBJECTIVE:  On precedex but arousable. Rt side weakness  VITAL SIGNS: Temp:  [98.8 F (37.1 C)-100.4 F (38 C)] 98.9 F (37.2 C) (08/09 0805) Pulse Rate:  [71-92] 83 (08/09 0806) Resp:  [0-31] 26 (08/09 0806) BP: (79-172)/(50-96) 133/77 mmHg (08/09 0806) SpO2:  [86 %-100 %] 100 % (08/09 0806) FiO2 (%):  [40 %-50 %] 40 % (08/09 0806) Weight:  [149 lb 11.1 oz (67.9 kg)] 149 lb 11.1 oz (67.9 kg) (08/09 0337) VENTILATOR SETTINGS: Vent Mode:  [-] PRVC FiO2 (%):  [40 %-50 %] 40 % Set Rate:  [24 bmp] 24 bmp Vt Set:  [500 mL] 500 mL PEEP:  [5 cmH20] 5 cmH20 Plateau Pressure:  [14 cmH20-26 cmH20] 25 cmH20 INTAKE / OUTPUT:  Intake/Output Summary (Last 24 hours) at 07/15/14 0820 Last data filed at 07/15/14 0600  Gross per 24 hour  Intake 998.41 ml  Output   2370 ml  Net -1371.59 ml    PHYSICAL EXAMINATION: General: Chronically ill appearing female, follows commands intermittently Neuro: Follows some commands, rt side hemiparesis HEENT: Ott/OGT/ no jvd/lan  Cardiovascular: HSR RRR, no M/R/G.  Lungs:rhonchi through out.   Abdomen: BS x 4, soft, NT/ND. TF at goal Musculoskeletal: Bilateral BKA's, new right sided BKA staples noted, no dressing, no edema.  Skin: Intact, cool, no rashes.   LABS:  CBC  Recent Labs Lab 07/12/14 0842 07/14/14 0137 07/15/14 0505  WBC 26.9* 20.4* 20.5*  HGB 9.6* 8.3* 8.7*  HCT 29.8* 25.1* 26.0*  PLT 343 199 210   BMET  Recent Labs Lab 07/13/14 1800 07/14/14 0137 07/15/14 0505  NA 155* 138 147  K 3.1* 5.0 2.9*  CL 118* 109 113*  CO2 24 19 17*  BUN 14 11 14   CREATININE 1.53* 1.29* 1.43*  GLUCOSE 126* 465* 230*   Electrolytes  Recent Labs Lab 07/11/14 0330  07/12/14 1107  07/13/14 0835  07/13/14 1800 07/14/14 0137 07/15/14 0505  CALCIUM 8.7  < >  --   < > 7.5*  < > 7.6* 7.0* 7.3*  MG 2.0  --  2.6*  --  1.9  --   --   --   --   PHOS  --   --   --   --  <0.5*  --   --   --   --   < > = values in this interval not displayed.  Sepsis Markers  Recent Labs Lab 07/12/14 1520 07/12/14 2021 07/13/14 0221  LATICACIDVEN 2.7* 2.6* 2.4*   ABG  Recent Labs Lab 07/12/14 1441 07/12/14 2016 07/13/14 1145  PHART 7.338* 7.484* 7.454*  PCO2ART 28.8* 32.1*  33.2*  PO2ART 60.0* 64.0* PENDING   Liver Enzymes  Recent Labs Lab 07/12/14 1445 07/13/14 0835 07/14/14 0137  AST  --  40* 40*  ALT  --  18 18  ALKPHOS  --  141* 122*  BILITOT  --  0.5 0.5  ALBUMIN 2.2* 2.1* 1.8*   Cardiac Enzymes  Recent Labs Lab 07/10/14 0520  07/12/14 2021 07/13/14 0221 07/14/14 0930  TROPONINI  --   < > 0.37* 0.99* <0.30  PROBNP 3151.0*  --   --   --   --   < > = values in this interval not displayed. Glucose  Recent Labs Lab 07/14/14 1159 07/14/14 1641 07/14/14 1937 07/14/14 2346 07/15/14 0343 07/15/14 0703  GLUCAP 287* 137* 126* 105* 172* 195*    Imaging No results found.  Intake/Output Summary (Last 24 hours) at 07/15/14 0823 Last data filed at 07/15/14 0600  Gross per 24 hour  Intake 998.41 ml  Output   2370 ml  Net -1371.59 ml      ASSESSMENT / PLAN: PULMONARY ETT 8/06 >>  A:  Acute hypoxemic respiratory failure secondary to pneumonia and severe acidosis.  HCAP vs aspiration pneumonia in setting of encephalopathy. ? CVA  P:  Goal SpO2 > 92%.  Full mechanical support, high Ve >> attempting wean. VAP bundle.  Daily SBT Trend ABG / CXR  Albuterol PRN  Check MRI head to rule our acute stroke ordered 8/9  CARDIOVASCULAR  A:  Hypotension in setting of severe metabolic acidosis and hypovolemia. Pressor dependent as of 8/9 Sepsis, lactate reassuring. Chronic systolic heart failure, preserved systolic function EF 77% 8/6. R BKA 8/4. P:  Goal MAP > 65.  Trend troponin / lactate  Vascular following for R BKA Recheck lactic acid 8/9 Fluid bolus 8/9   RENAL  A:  AG metabolic acidosis, likely secondary to DKA ( urine ketones positive 8/4 ) >> Lactate is low and out of proportion with degree of acidosis  AKI  Hypernatremia (resolved) Hypokalemia (resolved) Hypovolemia (improved) P:  F/u BMET Fluid olus  GASTROINTESTINAL  A:  Nutrition  GIPx  P:  TF at goal Protonix    HEMATOLOGIC  A:  VTE Prophylaxis  Anemia of critical illness P:  SCD / Heparin [place]  Trend CBC   INFECTIOUS  A:  HIV  HCV  Possible HCAP  P:  ID follwing  Blood cx 8/4 >>>  Sputum cx 8/6 >>>   Vancomycin 8/4 >>>  Zosyn 8/4 >>>  Holding HAART   ENDOCRINE  A:  DM 2  >> Likely DKA without significant hyperglycemia  Hypokalemia hypophosphorous  P:  SSI Replete K+(ordered by Elink) Recheck K and Phos at 1300 8/9   NEUROLOGIC  A:  Acute metabolic encephalopathy CVA Rt side hemiparesis P:  Neurology following - appreciate recs -- MRI, TTE (when stable), low threshold for LP if s\s of encephalitis\meningitis.  High risk patient, HIV + on HAART RASS goal 0 to -1  Fentanyl PRN MRI 8/9 ordered  Today's summary: Will check MRI as she is more stable and on small amount of pressors. No extubation till MRI  complete. Try fluid bolus as she is in negative fluid balance.   Richardson Landry Minor ACNP Maryanna Shape PCCM Pager [phone] till 3 pm If no answer page [phone] 07/15/2014, 8:20 AM  Reviewed above, examined.  She will need to go for MRI.  Continue vent support.  Depending on MRI results will determine when she can be extubated.  CC time 35  minutes.  Chesley Mires, MD [facility] Pulmonary/Critical Care 07/15/2014, 2:22 PM Pager:  [phone] After 3pm call: [phone]

## 2014-07-15 NOTE — Progress Notes (Signed)
[facility] Progress Note Patient Name: [name] DOB: [birth year] MRN: [id number]  Date of Service  07/15/2014   HPI/Events of Note   Hypoxemia again noted. Per RN periodic. Sometimes associated with agitation, often not.   Hypophosphatemia. Hypokalemia.  eICU Interventions   C-Xray unremarkable from this AM. Monitor for now.   Replete lytes, repeat BMP/PO4 at 8 PM.   Intervention Category Major Interventions: Respiratory failure - evaluation and management Intermediate Interventions: Electrolyte abnormality - evaluation and management  Lenia Housley R. 07/15/2014, 3:41 PM

## 2014-07-16 ENCOUNTER — Inpatient Hospital Stay (HOSPITAL_COMMUNITY): Payer: Medicaid Other

## 2014-07-16 LAB — CBC
HCT: 22.1 % — ABNORMAL LOW (ref 36.0–46.0)
HEMOGLOBIN: 7.6 g/dL — AB (ref 12.0–15.0)
MCH: 30.3 pg (ref 26.0–34.0)
MCHC: 34.4 g/dL (ref 30.0–36.0)
MCV: 88 fL (ref 78.0–100.0)
Platelets: 177 10*3/uL (ref 150–400)
RBC: 2.51 MIL/uL — ABNORMAL LOW (ref 3.87–5.11)
RDW: 17.4 % — ABNORMAL HIGH (ref 11.5–15.5)
WBC: 17.6 10*3/uL — ABNORMAL HIGH (ref 4.0–10.5)

## 2014-07-16 LAB — GLUCOSE, CAPILLARY
GLUCOSE-CAPILLARY: 171 mg/dL — AB (ref 70–99)
GLUCOSE-CAPILLARY: 183 mg/dL — AB (ref 70–99)
Glucose-Capillary: 197 mg/dL — ABNORMAL HIGH (ref 70–99)
Glucose-Capillary: 174 mg/dL — ABNORMAL HIGH (ref 70–99)
Glucose-Capillary: 226 mg/dL — ABNORMAL HIGH (ref 70–99)
Glucose-Capillary: 193 mg/dL — ABNORMAL HIGH (ref 70–99)

## 2014-07-16 LAB — CULTURE, BLOOD (ROUTINE X 2)
Culture: NO GROWTH
Culture: NO GROWTH

## 2014-07-16 LAB — BASIC METABOLIC PANEL
Anion gap: 13 (ref 5–15)
BUN: 15 mg/dL (ref 6–23)
CALCIUM: 6.6 mg/dL — AB (ref 8.4–10.5)
CO2: 18 mEq/L — ABNORMAL LOW (ref 19–32)
Chloride: 119 mEq/L — ABNORMAL HIGH (ref 96–112)
Creatinine, Ser: 1.44 mg/dL — ABNORMAL HIGH (ref 0.50–1.10)
GFR calc Af Amer: 45 mL/min — ABNORMAL LOW (ref >90)
GFR, EST NON AFRICAN AMERICAN: 39 mL/min — AB (ref >90)
Glucose, Bld: 209 mg/dL — ABNORMAL HIGH (ref 70–99)
Potassium: 3.3 mEq/L — ABNORMAL LOW (ref 3.7–5.3)
SODIUM: 150 meq/L — AB (ref 137–147)

## 2014-07-16 LAB — BLOOD GAS, ARTERIAL
ACID-BASE DEFICIT: 6 mmol/L — AB (ref 0.0–2.0)
Acid-base deficit: 0.6 mmol/L (ref 0.0–2.0)
BICARBONATE: 22.6 meq/L (ref 20.0–24.0)
Bicarbonate: 17.6 mEq/L — ABNORMAL LOW (ref 20.0–24.0)
Drawn by: 39899
Drawn by: 23604
FIO2: 0.4 %
FIO2: 0.7 %
LHR: 24 {breaths}/min
LHR: 24 {breaths}/min
MECHVT: 500 mL
MECHVT: 500 mL
O2 Saturation: 91.9 %
PATIENT TEMPERATURE: 98.6
PCO2 ART: 33.2 mmHg — AB (ref 35.0–45.0)
PCO2 ART: 27.6 mmHg — AB (ref 35.0–45.0)
PEEP: 5 cmH2O
PEEP: 10 cmH2O
PO2 ART: 71 mmHg — AB (ref 80.0–100.0)
Patient temperature: 101
TCO2: 23.5 mmol/L (ref 0–100)
TCO2: 18.4 mmol/L (ref 0–100)
pH, Arterial: 7.454 — ABNORMAL HIGH (ref 7.350–7.450)
pH, Arterial: 7.421 (ref 7.350–7.450)
pO2, Arterial: 70.7 mmHg — ABNORMAL LOW (ref 80.0–100.0)

## 2014-07-16 LAB — MAGNESIUM: Magnesium: 1.9 mg/dL (ref 1.5–2.5)

## 2014-07-16 LAB — PROCALCITONIN: Procalcitonin: 0.29 ng/mL

## 2014-07-16 LAB — PHOSPHORUS: PHOSPHORUS: 3 mg/dL (ref 2.3–4.6)

## 2014-07-16 MED ORDER — INSULIN GLARGINE 100 UNIT/ML ~~LOC~~ SOLN
15.0000 [IU] | Freq: Every day | SUBCUTANEOUS | Status: DC
  Administered 2014-07-16 – 2014-07-18 (×3): 15 [IU] via SUBCUTANEOUS
  Filled 2014-07-16 (×4): qty 0.15

## 2014-07-16 MED ORDER — ETOMIDATE 2 MG/ML IV SOLN
INTRAVENOUS | Status: AC
  Administered 2014-07-16: 20 mg
  Filled 2014-07-16: qty 10

## 2014-07-16 MED ORDER — ETOMIDATE 2 MG/ML IV SOLN
INTRAVENOUS | Status: AC
  Filled 2014-07-16: qty 10

## 2014-07-16 MED ORDER — POTASSIUM CHLORIDE 20 MEQ/15ML (10%) PO LIQD
40.0000 meq | Freq: Once | ORAL | Status: AC
  Administered 2014-07-16: 40 meq
  Filled 2014-07-16: qty 30

## 2014-07-16 MED ORDER — DEXMEDETOMIDINE HCL IN NACL 200 MCG/50ML IV SOLN
0.4000 ug/kg/h | INTRAVENOUS | Status: DC
  Administered 2014-07-16 – 2014-07-17 (×5): 1.2 ug/kg/h via INTRAVENOUS
  Filled 2014-07-16: qty 100
  Filled 2014-07-16 (×2): qty 50
  Filled 2014-07-16: qty 100

## 2014-07-16 MED ORDER — MAGNESIUM SULFATE 40 MG/ML IJ SOLN
2.0000 g | Freq: Once | INTRAMUSCULAR | Status: AC
  Administered 2014-07-16: 2 g via INTRAVENOUS
  Filled 2014-07-16: qty 50

## 2014-07-16 NOTE — Progress Notes (Signed)
Stroke Team Progress Note   Admit Date: 06/21/2014  LOS: 12 days   HISTORY [name] is an 58 y.o. female with a history of HIV infection, nonischemic cardiomyopathy, peripheral vascular disease, hypercholesterolemia and diabetes mellitus who is 3 days postop right BK amputation, noted to not be moving her right side. She is currently intubated and on mechanical ventilation, but on no sedating medications. She was transferred to the intensive care unit following onset of hypotension, respiratory distress and worsening of metabolic acidosis at around 1 PM on 07/12/2014. She was last seen moving the right arm and hand at around 8 PM tonight. Stat CT scan of her head showed no acute intracranial abnormality. Patient has been on aspirin 81 mg per day. She had a low-grade temp of 100.5. She's currently on Zosyn and vancomycin.  SUBJECTIVE  no family member is at the bedside during am rounds.  Her  Neurological conditionappears stable.  Able to open eyes on voice and will follow   commands. Left arm spontaneously moving but not right arm or leg.  OBJECTIVE Most recent Vital Signs: Filed Vitals:   07/16/14 0900 07/16/14 1000 07/16/14 1136 07/16/14 1219  BP: 124/70 118/62 116/68   Pulse: 74 80 82   Temp:    98.9 F (37.2 C)  TempSrc:    Oral  Resp: 29 30 28    Height:      Weight:      SpO2: 100% 100% 99%    CBG (last 3)   Recent Labs  07/15/14 2337 07/16/14 0342 07/16/14 0752  GLUCAP 183* 197* 174*     IV Fluid Intake:   . dexmedetomidine 1.2 mcg/kg/hr (07/16/14 1041)  . feeding supplement (GLUCERNA 1.2 CAL) 1,000 mL (07/14/14 2000)  . phenylephrine (NEO-SYNEPHRINE) Adult infusion 15 mcg/min (07/16/14 0804)    Medications: Scheduled:  . antiseptic oral rinse  7 mL Mouth Rinse QID  . aspirin EC  81 mg Oral Daily  . chlorhexidine  15 mL Mouth Rinse BID  . dolutegravir  50 mg Oral Daily  . heparin  5,000 Units Subcutaneous 3 times per day  . insulin aspart  0-15 Units  Subcutaneous 6 times per day  . insulin glargine  15 Units Subcutaneous QHS  . lamiVUDine  150 mg Oral Daily  . magnesium sulfate 1 - 4 g bolus IVPB  2 g Intravenous Once  . multivitamin with minerals  1 tablet Per Tube Daily  . pantoprazole sodium  40 mg Per Tube Q24H  . piperacillin-tazobactam (ZOSYN)  IV  3.375 g Intravenous Q8H  . sodium chloride  10-40 mL Intracatheter Q12H  . vancomycin  1,000 mg Intravenous Q24H  . zidovudine  300 mg Oral Q12H   Continuous Infusions:  . dexmedetomidine 1.2 mcg/kg/hr (07/16/14 1041)  . feeding supplement (GLUCERNA 1.2 CAL) 1,000 mL (07/14/14 2000)  . phenylephrine (NEO-SYNEPHRINE) Adult infusion 15 mcg/min (07/16/14 0804)     Diet:  NPO  Activity:  Bedrest DVT Prophylaxis:  Heparin subq  CLINICALLY SIGNIFICANT STUDIES Basic Metabolic Panel:   Recent Labs Lab 07/15/14 2000 07/15/14 2200 07/16/14 0330  NA 147  --  150*  K 3.5*  --  3.3*  CL 117*  --  119*  CO2 17*  --  18*  GLUCOSE 263*  --  209*  BUN 15  --  15  CREATININE 1.42*  --  1.44*  CALCIUM 6.9*  --  6.6*  MG  --  1.9 1.9  PHOS 3.2  --  3.0  Liver Function Tests:   Recent Labs Lab 07/13/14 0835 07/14/14 0137  AST 40* 40*  ALT 18 18  ALKPHOS 141* 122*  BILITOT 0.5 0.5  PROT 6.6 5.7*  ALBUMIN 2.1* 1.8*   CBC:   Recent Labs Lab 07/12/14 0842  07/15/14 0505 07/16/14 0330  WBC 26.9*  < > 20.5* 17.6*  NEUTROABS 24.5*  --   --   --   HGB 9.6*  < > 8.7* 7.6*  HCT 29.8*  < > 26.0* 22.1*  MCV 93.4  < > 87.5 88.0  PLT 343  < > 210 177  < > = values in this interval not displayed. Coagulation: No results found for this basename: LABPROT, INR,  in the last 168 hours Cardiac Enzymes:   Recent Labs Lab 07/12/14 2021 07/13/14 0221 07/13/14 1200 07/14/14 0930  CKTOTAL  --   --  541*  --   CKMB  --   --  2.7  --   TROPONINI 0.37* 0.99*  --  <0.30   Urinalysis:   Recent Labs Lab 07/10/14 1358 07/12/14 1401 07/14/14 0029  COLORURINE YELLOW YELLOW  --    LABSPEC 1.015 1.012  --   PHURINE 6.0 6.5  --   GLUCOSEU 500* 500*  --   HGBUR LARGE* LARGE*  --   BILIRUBINUR NEGATIVE NEGATIVE  --   KETONESUR >80* >80* NEGATIVE  PROTEINUR 100* NEGATIVE  --   UROBILINOGEN 0.2 0.2  --   NITRITE NEGATIVE NEGATIVE  --   LEUKOCYTESUR NEGATIVE NEGATIVE  --    Lipid Panel    Component Value Date/Time   CHOL 83 07/14/2014 0137   TRIG 156* 07/14/2014 0137   HDL 13* 07/14/2014 0137   CHOLHDL 6.4 07/14/2014 0137   VLDL 31 07/14/2014 0137   LDLCALC 39 07/14/2014 0137   HgbA1C .resu Lab Results  Component Value Date   HGBA1C 6.6* 07/14/2014   TSH  Lab Results  Component Value Date   TSH 1.199 04/18/2014   Urine Drug Screen:     Component Value Date/Time   LABOPIA POSITIVE* 02/21/2013 0151   COCAINSCRNUR NONE DETECTED 02/21/2013 0151   LABBENZ NONE DETECTED 02/21/2013 0151   AMPHETMU NONE DETECTED 02/21/2013 0151   THCU NONE DETECTED 02/21/2013 0151   LABBARB NONE DETECTED 02/21/2013 0151    Alcohol Level: No results found for this basename: ETH,  in the last 168 hours  I personally reviewed the image studies below and agree with the interpretation.  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  Occluded origin left vertebral artery with irregular intraforaminal segment which may reflect chronic dissection, vessel remains patent. High-grade stenosis of the origin of the right vertebral artery.  Status post apparent right carotid endarterectomy. Approximately 66% narrowing of the left internal carotid artery origin by NASCET criteria.  CTA head: Calcific atherosclerosis of the carotid siphons with at least 50% stenosis.  High-grade stenosis of the right carotid terminus. No large vessel occlusion.     Ct Head Wo Contrast  07/13/2014    IMPRESSION: 1. No acute intracranial findings. 2.  Chronic ischemic injuries described above. 3. Chronically obstructed left maxillary sinus.      Chest Xray  07/13/2014    IMPRESSION: 1. Tubes and central line are in good position. 2. Improved pulmonary inflation. Residual basilar opacities could  reflect edema or infection.     07/12/2014   IMPRESSION: Endotracheal tube tip lies at the level of the upper carina.  Left PICC tip lies at the caval atrial junction.  No change in lung aeration.   Dg Chest Port 1 View  07/12/2014   IMPRESSION: 1. Lower lung volumes. 2. Persistent bibasilar opacities which could reflect atelectasis or pneumonia. 3. Probable superimposed pulmonary edema.     Dg Abd   07/13/2014  IMPRESSION: 1. Orogastric tube is coiled in the stomach. 2. Nonobstructive bowel gas pattern. 3. Chronic pancreatitis.    MRI of the brain  pending  2D Echocardiogram  07/12/14 - Left ventricle: The cavity size was normal. Systolic function was vigorous. The estimated ejection fraction was in the range of 65% to 70%. Wall motion was normal; there were no regional wall motion abnormalities. Doppler parameters are consistent with abnormal left ventricular relaxation (grade 1 diastolic dysfunction).  EKG  07/05/2014 sinus brady. For complete results please see formal report.  EEG :07/13/14  mild generalized slowing. No seizures PT/OT Recommendations pending  Physical Exam Temp:  [98.5 F (36.9 C)-99.7 F (37.6 C)] 98.9 F (37.2 C) (08/10 1219) Pulse Rate:  [59-91] 82 (08/10 1136) Resp:  [21-35] 28 (08/10 1136) BP: (63-139)/(41-81) 116/68 mmHg (08/10 1136) SpO2:  [83 %-100 %] 99 % (08/10 1136) FiO2 (%):  [50 %-70 %] 60 % (08/10 1136) Weight:  [62 kg (136 lb 11 oz)] 62 kg (136 lb 11 oz) (08/10 0500)  General - thin, well developed, intubated not on sedation, open eyes and will following commands.  Ophthalmologic - not cooperative on exam.  Cardiovascular - Regular rhythm with tachycardia.  Lung - decreased breath sound  bilaterally  Extremities - bilateral BK amputation  Neuro- Awake open eyes and consistently following commands, intubated without sedation, blinking to visual threat bilaterally, pupil 93mm bilaterally, reactive to light, spontaneously eye movement, but more middle position preference, corneal present, cough and gag present, left UE spontaneously movement, LLE against gravity on pain stimulation, RUE and RLE mild weakness unable to hold up against gravity but will withdraw to pain ASSESSMENT [name] is a 58 y.o. female with PMH of HIV, nonischemic cardiomyopathy, PVD, HLD and DM with previous left BK amputation admitted for right BK amputation. Pt condition deteriorated yesterday due to hypotension and respiratory distress which required intubation and transfer to ICU. She was found to not moving right UE and LE which triggered for neurology consult. By reviewing her record, it seemed that she had CUS 10/2013 showed right s/p CEA but left ICA 60-79% stenosis. Her CTA overnight also confirmed left ICA 65-70% stenosis with diffuse multivessel severe asthrosclerotic changes. 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 She had multiple stroke risk factors:    -She is yet on pressor support and has moderate to severe LICA stenosis but MRI shows no definite stroke and she likely has only a small subcortical infarct not seen on MRI or worsening of old prior deficits. We can liberalize BP goals a bit to MAP more than 65 now.   TREATMENT/PLAN    BP management, please avoid hypotension, MAP goal 65 and above  Consider change ASA 81 mg to 325mg  if not contraindicated  Continue antibiotics as per ID  GI and DVT prophylaxis  Stroke team will sign off call for questions.    Antony Contras, MD  07/16/2014 12:25 PM   To contact Stroke Continuity provider, please refer to [email or web address]  After hours, contact General Neurology

## 2014-07-16 NOTE — Clinical Social Work Note (Signed)
CSW continuing to follow for discharge disposition purposes.  Lubertha Sayres, MSW, [facility] Licensed Clinical Social Worker [phone] and [phone]

## 2014-07-16 NOTE — Progress Notes (Signed)
Video bronchoscopy procedure performed, BAL intervention performed,

## 2014-07-16 NOTE — Progress Notes (Signed)
[facility] Progress Note Patient Name: [name] DOB: November 05, 1956 MRN: [id number]  Date of Service  07/16/2014   HPI/Events of Note  Hypokalemia  eICU Interventions  Potassium replaced   Intervention Category Minor Interventions: Electrolytes abnormality - evaluation and management  Jheremy Boger 07/16/2014, 6:26 AM

## 2014-07-16 NOTE — Progress Notes (Signed)
PULMONARY / CRITICAL CARE MEDICINE   Name: [name] MRN: [id number] DOB: [birth year]    ADMISSION DATE:  06/18/2014 CONSULTATION DATE:  07/12/2014  CHIEF COMPLAINT: Hypotension, respiratory distress   INITIAL PRESENTATION: 58 yo HIV / HCV + with DM and prior left AKA who initially presented on 7/29 with right foot pain. 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 On 8/4 developed metabolic acidosis. Acute encephalopathy, leukocytosis and CXR suggestive of PNA. She was started on Abx for HCAP starting 8/4. On 8/6, developed worsening acidosis acidosis (ABG 7.09 / 17 / 71), hypotension and respiratory distress required emergent intubation by PCCM and transfer to ICU 07/12/14.   STUDIES:  8/5 Echo >>> EF 65-70%, no RWMA.   SIGNIFICANT EVENTS:  7/29 admitted for right foot pain  8/3 underwent right BKA  8/6 required emergent intubation, transfer to ICU  8/8 off insulin drip 07/15/14: On precedex but arousable. Rt side weakness    SUBJECTIVE/OVERNIGHT/INTERVAL HX 07/16/14: still on neo and RN reports patient being very sensitive to changes. RN says easy desats spontaneously overnight - on 60% fio2. High easy WOB perRN   VITAL SIGNS: Temp:  [98.5 F (36.9 C)-99.7 F (37.6 C)] 99.5 F (37.5 C) (08/10 0826) Pulse Rate:  [59-91] 79 (08/10 0800) Resp:  [21-35] 26 (08/10 0800) BP: (63-148)/(41-94) 74/56 mmHg (08/10 0800) SpO2:  [83 %-100 %] 96 % (08/10 0800) FiO2 (%):  [50 %-70 %] 60 % (08/10 0800) Weight:  [62 kg (136 lb 11 oz)] 62 kg (136 lb 11 oz) (08/10 0500) VENTILATOR SETTINGS: Vent Mode:  [-] PRVC FiO2 (%):  [50 %-70 %] 60 % Set Rate:  [24 bmp] 24 bmp Vt Set:  [500 mL] 500 mL PEEP:  [5 cmH20-10 cmH20] 10 cmH20 Plateau Pressure:  [19 cmH20-29 cmH20] 28 cmH20 INTAKE / OUTPUT:  Intake/Output Summary (Last 24 hours) at 07/16/14 0953 Last data filed at 07/16/14 6073  Gross per 24 hour  Intake 2446.93 ml  Output   2735 ml  Net -288.07 ml    PHYSICAL EXAMINATION: General: Chronically  ill appearing female, follows commands intermittently Neuro: Follows some commands, rt side hemiparesis HEENT: Ott/OGT/ no jvd/lan  Cardiovascular: HSR RRR, no M/R/G.  Lungs:coarse Abdomen: BS x 4, soft, NT/ND. TF at goal Musculoskeletal: Bilateral BKA's, new right sided BKA staples noted, no dressing, no edema.  Skin: Intact, cool, no rashes.   LABS: PULMONARY  Recent Labs Lab 07/13/14 1145 07/15/14 1653 07/15/14 1706 07/15/14 2030 07/16/14 0420  PHART 7.454* 7.328* 7.338* 7.377 7.421  PCO2ART 33.2* 30.8* 28.5* 27.8* 27.6*  PO2ART PENDING 30.0* 60.0* 90.3 70.7*  HCO3 22.6 16.1* 15.3* 16.0* 17.6*  TCO2 23.5 17 16  16.8 18.4  O2SAT PENDING 54.0 89.0 96.3 91.9    CBC  Recent Labs Lab 07/14/14 0137 07/15/14 0505 07/16/14 0330  HGB 8.3* 8.7* 7.6*  HCT 25.1* 26.0* 22.1*  WBC 20.4* 20.5* 17.6*  PLT 199 210 177    COAGULATION No results found for this basename: INR,  in the last 168 hours  CARDIAC   Recent Labs Lab 07/12/14 1505 07/12/14 2021 07/13/14 0221 07/14/14 0930  TROPONINI <0.30 0.37* 0.99* <0.30    Recent Labs Lab 07/10/14 0520  PROBNP 3151.0*     CHEMISTRY  Recent Labs Lab 07/11/14 0330  07/12/14 1107  07/13/14 0835  07/14/14 0137 07/15/14 0505 07/15/14 1300 07/15/14 2000 07/15/14 2200 07/16/14 0330  NA 148*  < >  --   < > 156*  < > 138 147 147 147  --  150*  K 3.3*  < >  --   < > 2.3*  < > 5.0 2.9* 3.4* 3.5*  --  3.3*  CL 112  < >  --   < > 114*  < > 109 113* 120* 117*  --  119*  CO2 14*  < >  --   < > 24  < > 19 17* 18* 17*  --  18*  GLUCOSE 154*  < >  --   < > 172*  < > 465* 230* 221* 263*  --  209*  BUN 24*  < >  --   < > 19  < > 11 14 16 15   --  15  CREATININE 1.41*  < >  --   < > 1.61*  < > 1.29* 1.43* 1.42* 1.42*  --  1.44*  CALCIUM 8.7  < >  --   < > 7.5*  < > 7.0* 7.3* 6.9* 6.9*  --  6.6*  MG 2.0  --  2.6*  --  1.9  --   --   --   --   --  1.9 1.9  PHOS  --   --   --   --  <0.5*  --   --   --  0.8* 3.2  --  3.0  < > =  values in this interval not displayed. Estimated Creatinine Clearance: 36.3 ml/min (by C-G formula based on Cr of 1.44).   LIVER  Recent Labs Lab 07/12/14 1445 07/13/14 0835 07/14/14 0137  AST  --  40* 40*  ALT  --  18 18  ALKPHOS  --  141* 122*  BILITOT  --  0.5 0.5  PROT  --  6.6 5.7*  ALBUMIN 2.2* 2.1* 1.8*     INFECTIOUS  Recent Labs Lab 07/12/14 2021 07/13/14 0221 07/15/14 0959  LATICACIDVEN 2.6* 2.4* 1.5     ENDOCRINE CBG (last 3)   Recent Labs  07/15/14 2337 07/16/14 0342 07/16/14 0752  GLUCAP 183* 197* 174*         IMAGING x48h Mr [name] Head Wo Contrast  07/15/2014   CLINICAL DATA:  58 year old female with history of HIV, cardiomyopathy, diabetes recently status post right below the knee amputation with decreased right side movement. Agitation. Initial encounter.  EXAM: MRI HEAD WITHOUT CONTRAST  MRA HEAD WITHOUT CONTRAST  TECHNIQUE: Multiplanar, multiecho pulse sequences of the brain and surrounding structures were obtained without intravenous contrast. Angiographic images of the head were obtained using MRA technique without contrast.  COMPARISON:  Head and neck CTA 07/13/2014 and earlier.  FINDINGS: MRI HEAD FINDINGS  No restricted diffusion or evidence of acute infarction. Major intracranial vascular flow voids are stable compared to the recent CTA findings, with poor flow or occlusion of the distal left vertebral artery (series 6, image 5).  Small chronic infarct in the left cerebellar hemisphere. T2 and FLAIR hyperintensity with volume loss in the left anterior corona radiata tracking to the caudate nucleus compatible with chronic supratentorial lacunar infarct. No supratentorial cortical encephalomalacia identified. There is Wallerian degeneration evident at the left midbrain.  No restricted diffusion to suggest acute infarction. No midline shift, mass effect, evidence of mass lesion, ventriculomegaly, extra-axial collection or acute intracranial  hemorrhage. Cervicomedullary junction and pituitary are within normal limits. Negative visualized cervical spine except for C5-C6 disc and endplate degeneration. Bone marrow signal is within normal limits. Visible internal auditory structures appear normal. Left maxillary sinus inflammation. Postoperative changes to both globes. Intubated.  Visualized scalp soft tissues are within normal limits.  MRA HEAD FINDINGS  There is antegrade flow signal detected in the distal left vertebral artery which functionally terminates in PICA. This despite the coarse calcification of the left V4 segment demonstrated on the comparison. Antegrade flow in the distal right vertebral artery without stenosis. Normal right PICA. Basilar artery is patent without stenosis. SCA and PCA origins are within normal limits. Posterior communicating arteries are present. Bilateral PCA branches are within normal limits.  Antegrade flow in both ICA siphons. Mild right and moderate left ICA siphon stenosis in the cavernous and anterior genu segments (left side stenosis series 506, image 5). Carotid termini remain patent. Posterior communicating artery origins are within normal limits.  Normal MCA and left ACA origins. Dominant left A1 segment. Anterior communicating artery and visualized bilateral ACA branches are within normal limits. Visualized bilateral MCA branches are within normal limits.  IMPRESSION: 1.  No acute intracranial abnormality. 2. Chronic ischemic disease in the left basal ganglia, adjacent deep white matter capsules, and the left cerebellum. 3. Intracranial MRA congruent with the recent CTA, demonstrating distal left vertebral artery and bilateral ICA siphon atherosclerosis and stenosis, but no major circle of Willis branch occlusion.   Electronically Signed   By: Lars Pinks M.D.   On: 07/15/2014 14:59   Mr Brain Wo Contrast  07/15/2014   CLINICAL DATA:  58 year old female with history of HIV, cardiomyopathy, diabetes recently  status post right below the knee amputation with decreased right side movement. Agitation. Initial encounter.  EXAM: MRI HEAD WITHOUT CONTRAST  MRA HEAD WITHOUT CONTRAST  TECHNIQUE: Multiplanar, multiecho pulse sequences of the brain and surrounding structures were obtained without intravenous contrast. Angiographic images of the head were obtained using MRA technique without contrast.  COMPARISON:  Head and neck CTA 07/13/2014 and earlier.  FINDINGS: MRI HEAD FINDINGS  No restricted diffusion or evidence of acute infarction. Major intracranial vascular flow voids are stable compared to the recent CTA findings, with poor flow or occlusion of the distal left vertebral artery (series 6, image 5).  Small chronic infarct in the left cerebellar hemisphere. T2 and FLAIR hyperintensity with volume loss in the left anterior corona radiata tracking to the caudate nucleus compatible with chronic supratentorial lacunar infarct. No supratentorial cortical encephalomalacia identified. There is Wallerian degeneration evident at the left midbrain.  No restricted diffusion to suggest acute infarction. No midline shift, mass effect, evidence of mass lesion, ventriculomegaly, extra-axial collection or acute intracranial hemorrhage. Cervicomedullary junction and pituitary are within normal limits. Negative visualized cervical spine except for C5-C6 disc and endplate degeneration. Bone marrow signal is within normal limits. Visible internal auditory structures appear normal. Left maxillary sinus inflammation. Postoperative changes to both globes. Intubated. Visualized scalp soft tissues are within normal limits.  MRA HEAD FINDINGS  There is antegrade flow signal detected in the distal left vertebral artery which functionally terminates in PICA. This despite the coarse calcification of the left V4 segment demonstrated on the comparison. Antegrade flow in the distal right vertebral artery without stenosis. Normal right PICA. Basilar  artery is patent without stenosis. SCA and PCA origins are within normal limits. Posterior communicating arteries are present. Bilateral PCA branches are within normal limits.  Antegrade flow in both ICA siphons. Mild right and moderate left ICA siphon stenosis in the cavernous and anterior genu segments (left side stenosis series 506, image 5). Carotid termini remain patent. Posterior communicating artery origins are within normal limits.  Normal MCA and left ACA origins.  Dominant left A1 segment. Anterior communicating artery and visualized bilateral ACA branches are within normal limits. Visualized bilateral MCA branches are within normal limits.  IMPRESSION: 1.  No acute intracranial abnormality. 2. Chronic ischemic disease in the left basal ganglia, adjacent deep white matter capsules, and the left cerebellum. 3. Intracranial MRA congruent with the recent CTA, demonstrating distal left vertebral artery and bilateral ICA siphon atherosclerosis and stenosis, but no major circle of Willis branch occlusion.   Electronically Signed   By: Lars Pinks M.D.   On: 07/15/2014 14:59   Dg Chest Port 1 View  07/15/2014   CLINICAL DATA:  Acute hypoxia.  EXAM: PORTABLE CHEST - 1 VIEW  COMPARISON:  07/15/2014  FINDINGS: Endotracheal tube is in place 4.8 cm above carina. Nasogastric tube has been placed, tip coiled and overlying the region of the gastric fundus. Left-sided PICC line tip overlies the level of the superior vena cava.  The heart is enlarged. There is bilateral interstitial pulmonary edema slightly increased over recent studies. No focal consolidations or pleural effusions are identified.  IMPRESSION: 1. Stable lines and tubes. 2. Increased interstitial edema.   Electronically Signed   By: Shon Hale M.D.   On: 07/15/2014 16:44   Dg Chest Port 1 View  07/15/2014   CLINICAL DATA:  Respiratory failure.  EXAM: PORTABLE CHEST - 1 VIEW  COMPARISON:  07/13/2014  FINDINGS: Endotracheal tube tip is approximately 2.5 cm  above the carina. PICC line positioning stable in the lower SVC. Nasogastric tube extends into the stomach. Lungs show mild bibasilar atelectasis. There is no evidence of pulmonary edema, consolidation, pneumothorax or pleural fluid. The heart size and mediastinal contours are normal.  IMPRESSION: Mild bibasilar atelectasis.   Electronically Signed   By: Aletta Edouard M.D.   On: 07/15/2014 07:24        Intake/Output Summary (Last 24 hours) at 07/16/14 0953 Last data filed at 07/16/14 9030  Gross per 24 hour  Intake 2446.93 ml  Output   2735 ml  Net -288.07 ml     ASSESSMENT / PLAN: PULMONARY ETT 8/06 >>  A:  Acute hypoxemic respiratory failure secondary to pneumonia and severe acidosis.  HCAP vs aspiration pneumonia in setting of encephalopathy. ? CVA    - Likely ongoing ARDS with 60%fio2/peep 10 in setting of normal echo and bnp. Doubt PCP in setting of CD4 560   P:  Goal SpO2 > 92%.  Full mechanical support, high Ve >> attempting wean. - ensure Vt < 8cc.kg/ibw VAP bundle.  Daily SBT Trend ABG / CXR  Albuterol PRN    CARDIOVASCULAR  A:  Hypotension in setting of severe metabolic acidosis and hypovolemia. Pressor dependent as of 8/9 Sepsis, lactate reassuring. Chronic systolic heart failure, preserved systolic function EF 09% 8/6. R BKA 8/4.   - still pressor dependent  P:  Goal SBP 110-160 per neuro; use sbp instead of MAP in setting of neuro issue Vascular following for R BKA   RENAL  A:  AG metabolic acidosis, likely secondary to DKA ( urine ketones positive 8/4 ) >> Lactate is low and out of proportion with degree of acidosis  AKI    - creat improving. Low Mag and low K  P:  Replete mag Replete K (done earlier today( F/u BMET   GASTROINTESTINAL  A:  Nutrition  GIPx  P:  TF at goal Protonix    HEMATOLOGIC  A:  VTE Prophylaxis  Anemia of critical illness P:  SCD / Heparin  [place]  Trend CBC  PRBC for hgb <7gm%  INFECTIOUS  A:  HIV  with CD4 560 on 07/12/14 HCV  Possible HCAP   Blood cx 8/4 >>> neg Sputum cx 8/6 >>>  Normal flora  P Might need bronch BAL; will await ID input Vancomycin 8/4 >>>  Zosyn 8/4 >>>  Holding HAART   ENDOCRINE  A:  DM 2  >> Likely DKA without significant hyperglycemia   P:  SSI   NEUROLOGIC  A:  Acute metabolic encephalopathy CVA Rt side hemiparesis  MRI 07/15/14 - left vertebral arterly and left ICA stenosis P:  SBP > 110-160 Neurology following - appreciate recs --  TTE (when stable), low threshold for LP if s\s of encephalitis\meningitis.  High risk patient, HIV + on HAART RASS goal 0 to -1  Fentanyl PRN Await neuro opinoon on LP need  Today's summary: No extubation till ALI and pressor needs resolve.No family at bedside    The patient is critically ill with multiple organ systems failure and requires high complexity decision making for assessment and support, frequent evaluation and titration of therapies, application of advanced monitoring technologies and extensive interpretation of multiple databases.   Critical Care Time devoted to patient care services described in this note is  40  Minutes.  Dr. Brand Males, M.D., [facility].C.P Pulmonary and Critical Care Medicine Staff Physician [facility] Pager: [phone], If no answer or between  15:00h - 7:00h: call [phone]  07/16/2014 10:21 AM

## 2014-07-16 NOTE — Progress Notes (Signed)
[facility] Progress Note Patient Name: [name] DOB: [birth year] MRN: [id number]  Date of Service  07/16/2014   HPI/Events of Note   Pt worse in full blown ARDS  eICU Interventions  Change to PCV mode F/u abg   Intervention Category Major Interventions: Respiratory failure - evaluation and management  Asencion Noble 07/16/2014, 5:09 PM

## 2014-07-16 NOTE — Progress Notes (Signed)
Patient ID: [name], female   DOB: Sep 23, 1956, 58 y.o.   MRN: [id number] Remains critically ill on ventilator. Right BKA healing with no evidence of wound issues. Following from side lines

## 2014-07-16 NOTE — Progress Notes (Signed)
Inpatient Diabetes Program Recommendations  AACE/ADA: New Consensus Statement on Inpatient Glycemic Control (2013)  Target Ranges:  Prepandial:   less than 140 mg/dL      Peak postprandial:   less than 180 mg/dL (1-2 hours)      Critically ill patients:  140 - 180 mg/dL   Reason for Assessment:  Results for TAMAIRA, CIRIELLO (MRN [id number]) as of 07/16/2014 10:33  Ref. Range 07/15/2014 15:30 07/15/2014 19:17 07/15/2014 23:37 07/16/2014 03:42 07/16/2014 07:52  Glucose-Capillary Latest Range: 70-99 mg/dL 156 (H) 240 (H) 183 (H) 197 (H) 174 (H)    Diabetes history: Diabetes/Type 1 Outpatient Diabetes medications: Novolog Flexpen,  Lantus 40 units q HS Current orders for Inpatient glycemic control:  Lantus 15 units daily, Novolog moderate q 4 hours-Glucerna 60 cc per hour  Agree with increase in Lantus.  CBG's less than 200 mg/dL today.  Will follow.  Adah Perl, RN, BC-ADM Inpatient Diabetes Coordinator Pager [phone]

## 2014-07-16 NOTE — Progress Notes (Signed)
Patient ID: [name], female   DOB: December 13, 1955, 58 y.o.   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 antiseptic oral rinse  7 mL Mouth Rinse QID  . aspirin EC  81 mg Oral Daily  . chlorhexidine  15 mL Mouth Rinse BID  . dolutegravir  50 mg Oral Daily  . heparin  5,000 Units Subcutaneous 3 times per day  . insulin aspart  0-15 Units Subcutaneous 6 times per day  . insulin glargine  15 Units Subcutaneous QHS  . lamiVUDine  150 mg Oral Daily  . magnesium sulfate 1 - 4 g bolus IVPB  2 g Intravenous Once  . multivitamin with minerals  1 tablet Per Tube Daily  . pantoprazole sodium  40 mg Per Tube Q24H  . piperacillin-tazobactam (ZOSYN)  IV  3.375 g Intravenous Q8H  . sodium chloride  10-40 mL Intracatheter Q12H  . vancomycin  1,000 mg Intravenous Q24H  . zidovudine  300 mg Oral Q12H    Objective: Temp:  [98.5 F (36.9 C)-99.7 F (37.6 C)] 99.5 F (37.5 C) (08/10 0826) Pulse Rate:  [59-91] 80 (08/10 1000) Resp:  [21-35] 30 (08/10 1000) BP: (63-139)/(41-81) 118/62 mmHg (08/10 1000) SpO2:  [83 %-100 %] 100 % (08/10 1000) FiO2 (%):  [50 %-70 %] 60 % (08/10 1000) Weight:  [136 lb 11 oz (62 kg)] 136 lb 11 oz (62 kg) (08/10 0500)  General: More  Skin: No acute rash  Lungs: Coarse rhonchi    Lab Results Lab Results  Component Value Date   WBC 17.6* 07/16/2014   HGB 7.6* 07/16/2014   HCT 22.1* 07/16/2014   MCV 88.0 07/16/2014   PLT 177 07/16/2014    Lab Results  Component Value Date   CREATININE 1.44* 07/16/2014   BUN 15 07/16/2014   NA 150* 07/16/2014   K 3.3* 07/16/2014   CL  119* 07/16/2014   CO2 18* 07/16/2014    Lab Results  Component Value Date   ALT 18 07/14/2014   AST 40* 07/14/2014   ALKPHOS 122* 07/14/2014   BILITOT 0.5 07/14/2014    HIV 1 RNA Quant (copies/mL)  Date Value  07/12/2014 <20   06/12/2014 <20   11/21/2013 <20      CD4 T Cell Abs (/uL)  Date Value  07/12/2014 560   07/01/2014 1200   11/21/2013 1140     Microbiology: Recent Results (from the past 240 hour(s))  CULTURE, BLOOD (ROUTINE X 2)     Status: None   Collection Time    07/10/14  9:20 AM      Result Value Ref Range Status   Specimen Description BLOOD RIGHT ARM   Final   Special Requests BOTTLES DRAWN AEROBIC ONLY 5CC   Final   Culture  Setup Time     Final   Value: 07/10/2014 15:54     Performed at [facility]   Culture     Final   Value: NO GROWTH  5 DAYS     Performed at [facility]   Report Status 07/16/2014 FINAL   Final  CULTURE, BLOOD (ROUTINE X 2)     Status: None   Collection Time    07/10/14  9:31 AM      Result Value Ref Range Status   Specimen Description BLOOD LEFT HAND   Final   Special Requests BOTTLES DRAWN AEROBIC AND ANAEROBIC 10CC   Final   Culture  Setup Time     Final   Value: 07/10/2014 15:54     Performed at [facility]   Culture     Final   Value: NO GROWTH 5 DAYS     Performed at [facility]   Report Status 07/16/2014 FINAL   Final  MRSA PCR SCREENING     Status: None   Collection Time    07/10/14  4:45 PM      Result Value Ref Range Status   MRSA by PCR NEGATIVE  NEGATIVE Final   Comment:            The GeneXpert MRSA Assay (FDA     approved for NASAL specimens     only), is one component of a     comprehensive MRSA colonization     surveillance program. It is not     intended to diagnose MRSA     infection nor to guide or     monitor treatment for     MRSA infections.  URINE CULTURE     Status: None   Collection Time    07/12/14  2:01 PM      Result Value Ref Range Status   Specimen Description  URINE, CATHETERIZED   Final   Special Requests NONE   Final   Culture  Setup Time     Final   Value: 07/12/2014 15:20     Performed at [facility]     Final   Value: NO GROWTH     Performed at [facility]   Culture     Final   Value: NO GROWTH     Performed at [facility]   Report Status 07/13/2014 FINAL   Final  CULTURE, RESPIRATORY (NON-EXPECTORATED)     Status: None   Collection Time    07/12/14  4:15 PM      Result Value Ref Range Status   Specimen Description TRACHEAL ASPIRATE   Final   Special Requests Immunocompromised   Final   Gram Stain     Final   Value: MODERATE WBC PRESENT,BOTH PMN AND MONONUCLEAR     FEW SQUAMOUS EPITHELIAL CELLS PRESENT     RARE GRAM POSITIVE COCCI     IN PAIRS RARE GRAM VARIABLE ROD     Performed at [facility]   Culture     Final   Value: Non-Pathogenic Oropharyngeal-type Flora Isolated.     Performed at [facility]   Report Status 07/15/2014 FINAL   Final  CULTURE, BLOOD (ROUTINE X 2)     Status: None   Collection Time    07/13/14  7:03 PM      Result Value Ref Range Status   Specimen Description BLOOD RIGHT HAND   Final   Special Requests     Final   Value: BOTTLES DRAWN AEROBIC AND ANAEROBIC 10CC AER,5CC ANA   Culture  Setup Time     Final   Value: 07/14/2014 00:40  Performed at [facility]     Final   Value:        BLOOD CULTURE RECEIVED NO GROWTH TO DATE CULTURE WILL BE HELD FOR 5 DAYS BEFORE ISSUING A FINAL NEGATIVE REPORT     Performed at [facility]   Report Status PENDING   Incomplete  CULTURE, BLOOD (ROUTINE X 2)     Status: None   Collection Time    07/13/14  7:15 PM      Result Value Ref Range Status   Specimen Description BLOOD RIGHT HAND   Final   Special Requests BOTTLES DRAWN AEROBIC ONLY 8CC   Final   Culture  Setup Time     Final   Value: 07/14/2014 00:40     Performed at [facility]   Culture     Final   Value:         BLOOD CULTURE RECEIVED NO GROWTH TO DATE CULTURE WILL BE HELD FOR 5 DAYS BEFORE ISSUING A FINAL NEGATIVE REPORT     Performed at [facility]   Report Status PENDING   Incomplete  CLOSTRIDIUM DIFFICILE BY PCR     Status: None   Collection Time    07/14/14 12:29 AM      Result Value Ref Range Status   C difficile by pcr NEGATIVE  NEGATIVE Final    Studies/Results: Mr Virgel Paling Wo Contrast  07/15/2014   CLINICAL DATA:  58 year old female with history of HIV, cardiomyopathy, diabetes recently status post right below the knee amputation with decreased right side movement. Agitation. Initial encounter.  EXAM: MRI HEAD WITHOUT CONTRAST  MRA HEAD WITHOUT CONTRAST  TECHNIQUE: Multiplanar, multiecho pulse sequences of the brain and surrounding structures were obtained without intravenous contrast. Angiographic images of the head were obtained using MRA technique without contrast.  COMPARISON:  Head and neck CTA 07/13/2014 and earlier.  FINDINGS: MRI HEAD FINDINGS  No restricted diffusion or evidence of acute infarction. Major intracranial vascular flow voids are stable compared to the recent CTA findings, with poor flow or occlusion of the distal left vertebral artery (series 6, image 5).  Small chronic infarct in the left cerebellar hemisphere. T2 and FLAIR hyperintensity with volume loss in the left anterior corona radiata tracking to the caudate nucleus compatible with chronic supratentorial lacunar infarct. No supratentorial cortical encephalomalacia identified. There is Wallerian degeneration evident at the left midbrain.  No restricted diffusion to suggest acute infarction. No midline shift, mass effect, evidence of mass lesion, ventriculomegaly, extra-axial collection or acute intracranial hemorrhage. Cervicomedullary junction and pituitary are within normal limits. Negative visualized cervical spine except for C5-C6 disc and endplate degeneration. Bone marrow signal is within normal limits.  Visible internal auditory structures appear normal. Left maxillary sinus inflammation. Postoperative changes to both globes. Intubated. Visualized scalp soft tissues are within normal limits.  MRA HEAD FINDINGS  There is antegrade flow signal detected in the distal left vertebral artery which functionally terminates in PICA. This despite the coarse calcification of the left V4 segment demonstrated on the comparison. Antegrade flow in the distal right vertebral artery without stenosis. Normal right PICA. Basilar artery is patent without stenosis. SCA and PCA origins are within normal limits. Posterior communicating arteries are present. Bilateral PCA branches are within normal limits.  Antegrade flow in both ICA siphons. Mild right and moderate left ICA siphon stenosis in the cavernous and anterior genu segments (left side stenosis series 506, image 5). Carotid termini remain patent. Posterior  communicating artery origins are within normal limits.  Normal MCA and left ACA origins. Dominant left A1 segment. Anterior communicating artery and visualized bilateral ACA branches are within normal limits. Visualized bilateral MCA branches are within normal limits.  IMPRESSION: 1.  No acute intracranial abnormality. 2. Chronic ischemic disease in the left basal ganglia, adjacent deep white matter capsules, and the left cerebellum. 3. Intracranial MRA congruent with the recent CTA, demonstrating distal left vertebral artery and bilateral ICA siphon atherosclerosis and stenosis, but no major circle of Willis branch occlusion.   Electronically Signed   By: Lars Pinks M.D.   On: 07/15/2014 14:59   Mr Brain Wo Contrast  07/15/2014   CLINICAL DATA:  58 year old female with history of HIV, cardiomyopathy, diabetes recently status post right below the knee amputation with decreased right side movement. Agitation. Initial encounter.  EXAM: MRI HEAD WITHOUT CONTRAST  MRA HEAD WITHOUT CONTRAST  TECHNIQUE: Multiplanar, multiecho pulse  sequences of the brain and surrounding structures were obtained without intravenous contrast. Angiographic images of the head were obtained using MRA technique without contrast.  COMPARISON:  Head and neck CTA 07/13/2014 and earlier.  FINDINGS: MRI HEAD FINDINGS  No restricted diffusion or evidence of acute infarction. Major intracranial vascular flow voids are stable compared to the recent CTA findings, with poor flow or occlusion of the distal left vertebral artery (series 6, image 5).  Small chronic infarct in the left cerebellar hemisphere. T2 and FLAIR hyperintensity with volume loss in the left anterior corona radiata tracking to the caudate nucleus compatible with chronic supratentorial lacunar infarct. No supratentorial cortical encephalomalacia identified. There is Wallerian degeneration evident at the left midbrain.  No restricted diffusion to suggest acute infarction. No midline shift, mass effect, evidence of mass lesion, ventriculomegaly, extra-axial collection or acute intracranial hemorrhage. Cervicomedullary junction and pituitary are within normal limits. Negative visualized cervical spine except for C5-C6 disc and endplate degeneration. Bone marrow signal is within normal limits. Visible internal auditory structures appear normal. Left maxillary sinus inflammation. Postoperative changes to both globes. Intubated. Visualized scalp soft tissues are within normal limits.  MRA HEAD FINDINGS  There is antegrade flow signal detected in the distal left vertebral artery which functionally terminates in PICA. This despite the coarse calcification of the left V4 segment demonstrated on the comparison. Antegrade flow in the distal right vertebral artery without stenosis. Normal right PICA. Basilar artery is patent without stenosis. SCA and PCA origins are within normal limits. Posterior communicating arteries are present. Bilateral PCA branches are within normal limits.  Antegrade flow in both ICA siphons.  Mild right and moderate left ICA siphon stenosis in the cavernous and anterior genu segments (left side stenosis series 506, image 5). Carotid termini remain patent. Posterior communicating artery origins are within normal limits.  Normal MCA and left ACA origins. Dominant left A1 segment. Anterior communicating artery and visualized bilateral ACA branches are within normal limits. Visualized bilateral MCA branches are within normal limits.  IMPRESSION: 1.  No acute intracranial abnormality. 2. Chronic ischemic disease in the left basal ganglia, adjacent deep white matter capsules, and the left cerebellum. 3. Intracranial MRA congruent with the recent CTA, demonstrating distal left vertebral artery and bilateral ICA siphon atherosclerosis and stenosis, but no major circle of Willis branch occlusion.   Electronically Signed   By: Lars Pinks M.D.   On: 07/15/2014 14:59   Dg Chest Port 1 View  07/16/2014   CLINICAL DATA:  Hypoxia  EXAM: PORTABLE CHEST - 1 VIEW  COMPARISON:  July 15, 2014  FINDINGS: Endotracheal tube tip is 3.0 cm above the carina. Central catheter tip is in the right atrium slightly beyond the cavoatrial junction. Nasogastric tube tip and side port are in the stomach. No pneumothorax. There is interstitial edema throughout the lungs diffusely, stable. No new opacity is seen. Heart is upper normal in size with pulmonary vascularity within normal limits.  IMPRESSION: Extensive interstitial edema, stable. No new opacity. Tube and catheter positions as described without apparent pneumothorax.   Electronically Signed   By: Lowella Grip M.D.   On: 07/16/2014 07:17   Dg Chest Port 1 View  07/15/2014   CLINICAL DATA:  Acute hypoxia.  EXAM: PORTABLE CHEST - 1 VIEW  COMPARISON:  07/15/2014  FINDINGS: Endotracheal tube is in place 4.8 cm above carina. Nasogastric tube has been placed, tip coiled and overlying the region of the gastric fundus. Left-sided PICC line tip overlies the level of the superior  vena cava.  The heart is enlarged. There is bilateral interstitial pulmonary edema slightly increased over recent studies. No focal consolidations or pleural effusions are identified.  IMPRESSION: 1. Stable lines and tubes. 2. Increased interstitial edema.   Electronically Signed   By: Shon Hale M.D.   On: 07/15/2014 16:44   Dg Chest Port 1 View  07/15/2014   CLINICAL DATA:  Respiratory failure.  EXAM: PORTABLE CHEST - 1 VIEW  COMPARISON:  07/13/2014  FINDINGS: Endotracheal tube tip is approximately 2.5 cm above the carina. PICC line positioning stable in the lower SVC. Nasogastric tube extends into the stomach. Lungs show mild bibasilar atelectasis. There is no evidence of pulmonary edema, consolidation, pneumothorax or pleural fluid. The heart size and mediastinal contours are normal.  IMPRESSION: Mild bibasilar atelectasis.   Electronically Signed   By: Aletta Edouard M.D.   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 I would agree with bronchoscopy for BAL cultures. This is not likely to be an HIV related opportunistic infection cannot rule out multidrug resistant HCAP.  Plan: 1. Continue vancomycin and piperacillin tazobactam for now 2. Agree with bronchoscopy 3. Continue renally adjusted antiretroviral therapy  Michel Bickers, MD [facility] [place] [phone] pager   [phone] cell 07/16/2014, 11:31 AM

## 2014-07-16 NOTE — Procedures (Signed)
Bedside Bronchoscopy Procedure Note [name] [id number] 04-03-1956  Procedure: Bronchoscopy Indications: Diagnostic evaluation of the airways and Obtain specimens for culture and/or other diagnostic studies  Procedure Details: ET Tube Size:7.5 ET Tube secured at lip (cm):23 Bite block in place: No In preparation for procedure, Patient hyper-oxygenated with 100 % FiO2 Airway entered and the following bronchi were examined: RUL, RML, RLL, LUL, LLL and Bronchi.   Bronchoscope removed.  , Patient placed back on 100% FiO2 at conclusion of procedure.    Evaluation BP 93/57  Pulse 81  Temp(Src) 98.9 F (37.2 C) (Oral)  Resp 30  Ht 5\' 4"  (1.626 m)  Wt 136 lb 11 oz (62 kg)  BMI 23.45 kg/m2  SpO2 99% Breath Sounds:Clear O2 sats: stable throughout Patient's Current Condition: stable Specimens:  BAL Complications: No apparent complications Patient did tolerate procedure well.   Sharyne Richters 07/16/2014, 3:10 PM

## 2014-07-16 NOTE — Progress Notes (Signed)
Dr. Brand Males, M.D., [facility].C.P Pulmonary and Critical Care Medicine Staff Physician [place] [facility] Pager: [phone], If no answer or between  15:00h - 7:00h: call [phone]  07/16/2014 3:53 PM

## 2014-07-16 NOTE — Progress Notes (Signed)
D/w Dr Leonie Man  Unlikely stenotic stroke  So change MAP goa > 65. Ignore sBP goal  Dr. Brand Males, M.D., [facility].C.P Pulmonary and Critical Care Medicine Staff Physician [place] [facility] Pager: [phone], If no answer or between  15:00h - 7:00h: call [phone]  07/16/2014 12:19 PM

## 2014-07-16 NOTE — Procedures (Signed)
Bronchoscopy Procedure Note [name] [id number] 04-27-1956  Procedure: Bronchoscopy Indications: Diagnostic evaluation of the airways and Obtain specimens for culture and/or other diagnostic studies  Procedure Details Consent: Risks of procedure as well as the alternatives and risks of each were explained to the (patient/caregiver).  Consent for procedure obtained. Time Out: Verified patient identification, verified procedure, site/side was marked, verified correct patient position, special equipment/implants available, medications/allergies/relevent history reviewed, required imaging and test results available.  Performed  In preparation for procedure, patient was given 100% FiO2 and bronchoscope lubricated. Sedation: Etomidate  Airway entered and the following bronchi were examined: RUL, RML, RLL, LUL, LLL and Bronchi.   - all airways looked very normal without secretions or endobronchial lesions or ulcers. The right middle lobe was lavaged with 20cc x 3 normal saline with returns that were light grey (near normal looking) but darker than normal color. No hemorrhage   Procedures performed: Brushings performed Bronchoscope removed.  , Patient placed back on 100% FiO2 at conclusion of procedure.    Evaluation Hemodynamic Status: BP stable throughout; O2 sats: stable throughout Patient's Current Condition: stable Specimens:  None Complications: No apparent complications Patient did tolerate procedure well.   IMPRESSION 1. Normal airway exam 2. BAL - right middle lobe with specimens for evaluation   Dr. Brand Males, M.D., [facility].C.P Pulmonary and Critical Care Medicine Staff Physician [place] [facility] Pager: [phone], If no answer or between  15:00h - 7:00h: call [phone]  07/16/2014 3:13 PM

## 2014-07-17 ENCOUNTER — Inpatient Hospital Stay (HOSPITAL_COMMUNITY): Payer: Medicaid Other

## 2014-07-17 DIAGNOSIS — J9589 Other postprocedural complications and disorders of respiratory system, not elsewhere classified: Secondary | ICD-10-CM

## 2014-07-17 LAB — POCT I-STAT 3, ART BLOOD GAS (G3+)
Acid-base deficit: 8 mmol/L — ABNORMAL HIGH (ref 0.0–2.0)
Acid-base deficit: 11 mmol/L — ABNORMAL HIGH (ref 0.0–2.0)
Acid-base deficit: 6 mmol/L — ABNORMAL HIGH (ref 0.0–2.0)
BICARBONATE: 16 meq/L — AB (ref 20.0–24.0)
BICARBONATE: 20.8 meq/L (ref 20.0–24.0)
Bicarbonate: 22.2 mEq/L (ref 20.0–24.0)
O2 Saturation: 98 %
O2 Saturation: 84 %
O2 Saturation: 96 %
PCO2 ART: 68.2 mmHg — AB (ref 35.0–45.0)
PH ART: 7.376 (ref 7.350–7.450)
PH ART: 6.93 — AB (ref 7.350–7.450)
PH ART: 7.13 — AB (ref 7.350–7.450)
PO2 ART: 87 mmHg (ref 80.0–100.0)
PO2 ART: 112 mmHg — AB (ref 80.0–100.0)
Patient temperature: 101.2
TCO2: 17 mmol/L (ref 0–100)
TCO2: 24 mmol/L (ref 0–100)
TCO2: 24 mmol/L (ref 0–100)
pCO2 arterial: 27.6 mmHg — ABNORMAL LOW (ref 35.0–45.0)
pCO2 arterial: >100 mmHg (ref 35.0–45.0)
pO2, Arterial: 107 mmHg — ABNORMAL HIGH (ref 80.0–100.0)

## 2014-07-17 LAB — BLOOD GAS, ARTERIAL
ACID-BASE DEFICIT: 6.1 mmol/L — AB (ref 0.0–2.0)
Acid-base deficit: 8.4 mmol/L — ABNORMAL HIGH (ref 0.0–2.0)
Acid-base deficit: 7.5 mmol/L — ABNORMAL HIGH (ref 0.0–2.0)
BICARBONATE: 15.6 meq/L — AB (ref 20.0–24.0)
BICARBONATE: 19.8 meq/L — AB (ref 20.0–24.0)
BICARBONATE: 20.4 meq/L (ref 20.0–24.0)
Drawn by: 41977
Drawn by: 41977
Drawn by: 41977
FIO2: 0.7 %
FIO2: 0.7 %
FIO2: 0.7 %
LHR: 28 {breaths}/min
O2 SAT: 91.8 %
O2 Saturation: 96.9 %
O2 Saturation: 97.3 %
PATIENT TEMPERATURE: 101.8
PATIENT TEMPERATURE: 98.6
PCO2 ART: 57.7 mmHg — AB (ref 35.0–45.0)
PEEP/CPAP: 14 cmH2O
PEEP: 14 cmH2O
PEEP: 14 cmH2O
PH ART: 7.364 (ref 7.350–7.450)
PH ART: 7.162 — AB (ref 7.350–7.450)
PH ART: 7.214 — AB (ref 7.350–7.450)
Patient temperature: 98.6
Pressure control: 22 cmH2O
Pressure control: 22 cmH2O
RATE: 24 resp/min
RATE: 28 resp/min
TCO2: 16.5 mmol/L (ref 0–100)
TCO2: 21.6 mmol/L (ref 0–100)
TCO2: 22 mmol/L (ref 0–100)
pCO2 arterial: 28.8 mmHg — ABNORMAL LOW (ref 35.0–45.0)
pCO2 arterial: 52.6 mmHg — ABNORMAL HIGH (ref 35.0–45.0)
pO2, Arterial: 71.1 mmHg — ABNORMAL LOW (ref 80.0–100.0)
pO2, Arterial: 120 mmHg — ABNORMAL HIGH (ref 80.0–100.0)
pO2, Arterial: 116 mmHg — ABNORMAL HIGH (ref 80.0–100.0)

## 2014-07-17 LAB — CBC
HCT: 23.7 % — ABNORMAL LOW (ref 36.0–46.0)
Hemoglobin: 7.7 g/dL — ABNORMAL LOW (ref 12.0–15.0)
MCH: 29.8 pg (ref 26.0–34.0)
MCHC: 32.5 g/dL (ref 30.0–36.0)
MCV: 91.9 fL (ref 78.0–100.0)
PLATELETS: 225 10*3/uL (ref 150–400)
RBC: 2.58 MIL/uL — AB (ref 3.87–5.11)
RDW: 17.5 % — AB (ref 11.5–15.5)
WBC: 22.7 10*3/uL — AB (ref 4.0–10.5)

## 2014-07-17 LAB — LIPASE, BLOOD: Lipase: 17 U/L (ref 11–59)

## 2014-07-17 LAB — GLUCOSE, CAPILLARY
GLUCOSE-CAPILLARY: 168 mg/dL — AB (ref 70–99)
Glucose-Capillary: 219 mg/dL — ABNORMAL HIGH (ref 70–99)
Glucose-Capillary: 180 mg/dL — ABNORMAL HIGH (ref 70–99)
Glucose-Capillary: 178 mg/dL — ABNORMAL HIGH (ref 70–99)
Glucose-Capillary: 218 mg/dL — ABNORMAL HIGH (ref 70–99)
Glucose-Capillary: 249 mg/dL — ABNORMAL HIGH (ref 70–99)

## 2014-07-17 LAB — PNEUMOCYSTIS JIROVECI SMEAR BY DFA: PNEUMOCYSTIS JIROVECI AG: NEGATIVE

## 2014-07-17 LAB — HEPATIC FUNCTION PANEL
ALBUMIN: 1.6 g/dL — AB (ref 3.5–5.2)
ALT: 17 U/L (ref 0–35)
AST: 26 U/L (ref 0–37)
Alkaline Phosphatase: 107 U/L (ref 39–117)
Bilirubin, Direct: <0.2 mg/dL (ref 0.0–0.3)
Total Bilirubin: 0.2 mg/dL — ABNORMAL LOW (ref 0.3–1.2)
Total Protein: 6 g/dL (ref 6.0–8.3)

## 2014-07-17 LAB — BODY FLUID CELL COUNT WITH DIFFERENTIAL
Eos, Fluid: 17 %
Lymphs, Fluid: 11 %
MONOCYTE-MACROPHAGE-SEROUS FLUID: 7 % — AB (ref 50–90)
Neutrophil Count, Fluid: 65 % — ABNORMAL HIGH (ref 0–25)
WBC FLUID: 160 uL (ref 0–1000)

## 2014-07-17 LAB — PROTIME-INR
INR: 1.66 — AB (ref 0.00–1.49)
Prothrombin Time: 19.6 seconds — ABNORMAL HIGH (ref 11.6–15.2)

## 2014-07-17 LAB — PROCALCITONIN
Procalcitonin: 0.31 ng/mL
Procalcitonin: 0.39 ng/mL

## 2014-07-17 LAB — BASIC METABOLIC PANEL
ANION GAP: 12 (ref 5–15)
BUN: 18 mg/dL (ref 6–23)
CHLORIDE: 125 meq/L — AB (ref 96–112)
CO2: 17 mEq/L — ABNORMAL LOW (ref 19–32)
Calcium: 7.3 mg/dL — ABNORMAL LOW (ref 8.4–10.5)
Creatinine, Ser: 1.55 mg/dL — ABNORMAL HIGH (ref 0.50–1.10)
GFR calc non Af Amer: 36 mL/min — ABNORMAL LOW (ref >90)
GFR, EST AFRICAN AMERICAN: 41 mL/min — AB (ref >90)
Glucose, Bld: 211 mg/dL — ABNORMAL HIGH (ref 70–99)
POTASSIUM: 3.9 meq/L (ref 3.7–5.3)
Sodium: 154 mEq/L — ABNORMAL HIGH (ref 137–147)

## 2014-07-17 LAB — CK TOTAL AND CKMB (NOT AT ARMC)
CK, MB: <1 ng/mL (ref 0.3–4.0)
Total CK: 138 U/L (ref 7–177)

## 2014-07-17 LAB — LACTIC ACID, PLASMA: Lactic Acid, Venous: 1.4 mmol/L (ref 0.5–2.2)

## 2014-07-17 LAB — PRO B NATRIURETIC PEPTIDE: PRO B NATRI PEPTIDE: 1264 pg/mL — AB (ref 0–125)

## 2014-07-17 LAB — TROPONIN I: Troponin I: <0.3 ng/mL (ref <0.30)

## 2014-07-17 LAB — VANCOMYCIN, TROUGH: VANCOMYCIN TR: 16.6 ug/mL (ref 10.0–20.0)

## 2014-07-17 MED ORDER — MIDAZOLAM BOLUS VIA INFUSION
1.0000 mg | INTRAVENOUS | Status: DC | PRN
  Filled 2014-07-17: qty 2

## 2014-07-17 MED ORDER — SODIUM CHLORIDE 0.9 % IV SOLN
0.0000 mg/h | INTRAVENOUS | Status: DC
  Administered 2014-07-17: 1 mg/h via INTRAVENOUS
  Administered 2014-07-17 – 2014-07-19 (×3): 2 mg/h via INTRAVENOUS
  Administered 2014-07-19 – 2014-07-20 (×2): 4 mg/h via INTRAVENOUS
  Administered 2014-07-21 – 2014-07-22 (×2): 2 mg/h via INTRAVENOUS
  Administered 2014-07-22: 10 mg/h via INTRAVENOUS
  Filled 2014-07-17 (×11): qty 10

## 2014-07-17 MED ORDER — SODIUM CHLORIDE 0.9 % IV BOLUS (SEPSIS)
1000.0000 mL | Freq: Once | INTRAVENOUS | Status: AC
  Administered 2014-07-17: 1000 mL via INTRAVENOUS

## 2014-07-17 MED ORDER — ASPIRIN EC 325 MG PO TBEC
325.0000 mg | DELAYED_RELEASE_TABLET | Freq: Every day | ORAL | Status: DC
Start: 2014-07-18 — End: 2014-07-21
  Administered 2014-07-18 – 2014-07-20 (×3): 325 mg via ORAL
  Filled 2014-07-17 (×4): qty 1

## 2014-07-17 MED ORDER — CHLORHEXIDINE GLUCONATE 0.12 % MT SOLN
15.0000 mL | Freq: Two times a day (BID) | OROMUCOSAL | Status: DC
  Administered 2014-07-17 – 2014-07-22 (×10): 15 mL via OROMUCOSAL
  Filled 2014-07-17 (×9): qty 15

## 2014-07-17 MED ORDER — OXEPA PO LIQD
1000.0000 mL | ORAL | Status: DC
  Administered 2014-07-18 – 2014-07-22 (×5): 1000 mL
  Filled 2014-07-17 (×11): qty 1000

## 2014-07-17 MED ORDER — PRO-STAT SUGAR FREE PO LIQD
30.0000 mL | Freq: Every day | ORAL | Status: DC
  Administered 2014-07-18: 11:00:00
  Administered 2014-07-19: 30 mL
  Administered 2014-07-20: 09:00:00
  Administered 2014-07-21 – 2014-07-22 (×2): 30 mL
  Filled 2014-07-17 (×5): qty 30

## 2014-07-17 MED ORDER — SODIUM BICARBONATE 8.4 % IV SOLN
100.0000 meq | Freq: Once | INTRAVENOUS | Status: AC
  Administered 2014-07-17: 100 meq via INTRAVENOUS
  Filled 2014-07-17: qty 100

## 2014-07-17 MED ORDER — FENTANYL BOLUS VIA INFUSION
50.0000 ug | INTRAVENOUS | Status: DC | PRN
  Filled 2014-07-17: qty 100

## 2014-07-17 MED ORDER — SODIUM CHLORIDE 0.9 % IV SOLN
0.0000 ug/h | INTRAVENOUS | Status: DC
  Administered 2014-07-17: 200 ug/h via INTRAVENOUS
  Administered 2014-07-17 – 2014-07-19 (×6): 300 ug/h via INTRAVENOUS
  Administered 2014-07-20 – 2014-07-22 (×3): 400 ug/h via INTRAVENOUS
  Administered 2014-07-22 (×2): 200 ug/h via INTRAVENOUS
  Filled 2014-07-17 (×18): qty 50

## 2014-07-17 MED ORDER — ARTIFICIAL TEARS OP OINT
1.0000 "application " | TOPICAL_OINTMENT | Freq: Three times a day (TID) | OPHTHALMIC | Status: DC
  Administered 2014-07-17 – 2014-07-20 (×10): 1 via OPHTHALMIC
  Filled 2014-07-17 (×2): qty 3.5

## 2014-07-17 MED ORDER — CISATRACURIUM BOLUS VIA INFUSION
0.1000 mg/kg | Freq: Once | INTRAVENOUS | Status: AC
  Administered 2014-07-17: 6.2 mg via INTRAVENOUS
  Filled 2014-07-17: qty 7

## 2014-07-17 MED ORDER — FENTANYL CITRATE 0.05 MG/ML IJ SOLN
50.0000 ug | Freq: Once | INTRAMUSCULAR | Status: AC
  Administered 2014-07-17: 50 ug via INTRAVENOUS

## 2014-07-17 MED ORDER — SODIUM CHLORIDE 0.9 % IV SOLN
3.0000 ug/kg/min | INTRAVENOUS | Status: DC
  Administered 2014-07-17: 3 ug/kg/min via INTRAVENOUS
  Administered 2014-07-18: 1.5 ug/kg/min via INTRAVENOUS
  Filled 2014-07-17 (×3): qty 20

## 2014-07-17 NOTE — Progress Notes (Signed)
Inpatient Diabetes Program Recommendations  AACE/ADA: New Consensus Statement on Inpatient Glycemic Control (2013)  Target Ranges:  Prepandial:   less than 140 mg/dL      Peak postprandial:   less than 180 mg/dL (1-2 hours)      Critically ill patients:  140 - 180 mg/dL   Reason for Assessment:  Results for [name], [name] (MRN [id number]) as of 07/17/2014 11:41  Ref. Range 07/16/2014 16:33 07/16/2014 19:54 07/16/2014 23:21 07/17/2014 04:03 07/17/2014 08:01  Glucose-Capillary Latest Range: 70-99 mg/dL 193 (H) 171 (H) 219 (H) 168 (H) 180 (H)    May consider increasing Lantus to 20 units daily (this is 1/2 of home dose).  Thanks, Adah Perl, RN, BC-ADM Inpatient Diabetes Coordinator Pager [phone]

## 2014-07-17 NOTE — Procedures (Signed)
Arterial Catheter Insertion Procedure Note [name] [id number] [birth year]  Procedure: Insertion of Arterial Catheter  Indications: Blood pressure monitoring and Frequent blood sampling  Procedure Details Consent: Unable to obtain consent because of emergent medical necessity. Time Out: Verified patient identification, verified procedure, site/side was marked, verified correct patient position, special equipment/implants available, medications/allergies/relevent history reviewed, required imaging and test results available.  Performed  Maximum sterile technique was used including antiseptics, cap, gloves, gown, hand hygiene, mask and sheet. Skin prep: Chlorhexidine; local anesthetic administered 20 gauge catheter was inserted into right radial artery using the Seldinger technique.  Evaluation Blood flow good; BP tracing good. Complications: No apparent complications.   Phillis Knack [facility] 07/17/2014

## 2014-07-17 NOTE — Progress Notes (Signed)
PULMONARY / CRITICAL CARE MEDICINE   Name: [name] MRN: [id number] DOB: 1956/09/20    ADMISSION DATE:  07/01/2014 CONSULTATION DATE:  07/12/2014  CHIEF COMPLAINT: Hypotension, respiratory distress   INITIAL PRESENTATION: 58 yo HIV / HCV + with DM and prior left AKA who initially presented on 7/29 with right foot pain. 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 On 8/4 developed metabolic acidosis. Acute encephalopathy, leukocytosis and CXR suggestive of PNA. She was started on Abx for HCAP starting 8/4. On 8/6, developed worsening acidosis acidosis (ABG 7.09 / 17 / 71), hypotension and respiratory distress required emergent intubation by PCCM and transfer to ICU 07/12/14.   STUDIES:  8/5 Echo >>> EF 65-70%, no RWMA.   SIGNIFICANT EVENTS:  7/29 admitted for right foot pain  8/3 underwent right BKA  8/6 required emergent intubation, transfer to ICU  8/8 off insulin drip 07/15/14: On precedex but arousable. Rt side weakness 07/16/14: ARDS changed to PCV. S/p BRONCH - BAL from RML   SUBJECTIVE/OVERNIGHT/INTERVAL HX 07/17/14: Now on PCV 70% fio2, peep 14. Worening ARDS.  Sedated with precedex. Foul smell of stool despite flexiseal  ++ x 2 days. On neo 57mcg.  Marland Kitchen   VITAL SIGNS: Temp:  [98.9 F (37.2 C)-101.8 F (38.8 C)] 100.3 F (37.9 C) (08/11 0803) Pulse Rate:  [77-103] 96 (08/11 1000) Resp:  [21-45] 32 (08/11 1000) BP: (74-116)/(48-68) 83/52 mmHg (08/11 1000) SpO2:  [94 %-100 %] 99 % (08/11 1000) FiO2 (%):  [60 %-100 %] 70 % (08/11 1000) Weight:  [61.9 kg (136 lb 7.4 oz)] 61.9 kg (136 lb 7.4 oz) (08/11 0348) VENTILATOR SETTINGS: Vent Mode:  [-] PCV FiO2 (%):  [60 %-100 %] 70 % Set Rate:  [16 bmp-24 bmp] 24 bmp Vt Set:  [500 mL] 500 mL PEEP:  [10 cmH20-14 cmH20] 14 cmH20 Plateau Pressure:  [18 cmH20-33 cmH20] 30 cmH20 INTAKE / OUTPUT:  Intake/Output Summary (Last 24 hours) at 07/17/14 1121 Last data filed at 07/17/14 0900  Gross per 24 hour  Intake 2277.47 ml  Output   2510 ml   Net -232.53 ml    PHYSICAL EXAMINATION: General: Chronically ill appearing female, Neuro: Follows some commands, rt side hemiparesis, eyes open HEENT: Ott/OGT/ no jvd/lan  Cardiovascular: HSR RRR, no M/R/G.  Lungs:coarse Abdomen: BS x 4, soft, NT/ND. TF at goal Musculoskeletal: Bilateral BKA's, new right sided BKA staples noted, no dressing, no edema.  Skin: Intact, cool, no rashes. GU: looks ok, foul smell of stool + despite flexiseal   LABS: PULMONARY  Recent Labs Lab 07/15/14 1653 07/15/14 1706 07/15/14 2030 07/16/14 0420 07/16/14 1940  PHART 7.328* 7.338* 7.377 7.421 7.364  PCO2ART 30.8* 28.5* 27.8* 27.6* 28.8*  PO2ART 30.0* 60.0* 90.3 70.7* 71.1*  HCO3 16.1* 15.3* 16.0* 17.6* 15.6*  TCO2 17 16 16.8 18.4 16.5  O2SAT 54.0 89.0 96.3 91.9 91.8    CBC  Recent Labs Lab 07/15/14 0505 07/16/14 0330 07/17/14 0557  HGB 8.7* 7.6* 7.7*  HCT 26.0* 22.1* 23.7*  WBC 20.5* 17.6* 22.7*  PLT 210 177 225    COAGULATION No results found for this basename: INR,  in the last 168 hours  CARDIAC    Recent Labs Lab 07/12/14 1505 07/12/14 2021 07/13/14 0221 07/14/14 0930 07/17/14 0410  TROPONINI <0.30 0.37* 0.99* <0.30 <0.30    Recent Labs Lab 07/17/14 0410  PROBNP 1264.0*     CHEMISTRY  Recent Labs Lab 07/11/14 0330  07/12/14 1107  07/13/14 0835  07/15/14 0505 07/15/14 1300 07/15/14 2000  07/15/14 2200 07/16/14 0330 07/17/14 0557  NA 148*  < >  --   < > 156*  < > 147 147 147  --  150* 154*  K 3.3*  < >  --   < > 2.3*  < > 2.9* 3.4* 3.5*  --  3.3* 3.9  CL 112  < >  --   < > 114*  < > 113* 120* 117*  --  119* 125*  CO2 14*  < >  --   < > 24  < > 17* 18* 17*  --  18* 17*  GLUCOSE 154*  < >  --   < > 172*  < > 230* 221* 263*  --  209* 211*  BUN 24*  < >  --   < > 19  < > 14 16 15   --  15 18  CREATININE 1.41*  < >  --   < > 1.61*  < > 1.43* 1.42* 1.42*  --  1.44* 1.55*  CALCIUM 8.7  < >  --   < > 7.5*  < > 7.3* 6.9* 6.9*  --  6.6* 7.3*  MG 2.0  --   2.6*  --  1.9  --   --   --   --  1.9 1.9  --   PHOS  --   --   --   --  <0.5*  --   --  0.8* 3.2  --  3.0  --   < > = values in this interval not displayed. Estimated Creatinine Clearance: 33.7 ml/min (by C-G formula based on Cr of 1.55).   LIVER  Recent Labs Lab 07/12/14 1445 07/13/14 0835 07/14/14 0137  AST  --  40* 40*  ALT  --  18 18  ALKPHOS  --  141* 122*  BILITOT  --  0.5 0.5  PROT  --  6.6 5.7*  ALBUMIN 2.2* 2.1* 1.8*     INFECTIOUS  Recent Labs Lab 07/12/14 2021 07/13/14 0221 07/15/14 0959 07/16/14 1014 07/17/14 0410  LATICACIDVEN 2.6* 2.4* 1.5  --   --   PROCALCITON  --   --   --  0.29 0.31     ENDOCRINE CBG (last 3)   Recent Labs  07/16/14 2321 07/17/14 0403 07/17/14 0801  GLUCAP 219* 168* 180*         IMAGING x48h Mr [name] Head Wo Contrast  07/15/2014   CLINICAL DATA:  58 year old female with history of HIV, cardiomyopathy, diabetes recently status post right below the knee amputation with decreased right side movement. Agitation. Initial encounter.  EXAM: MRI HEAD WITHOUT CONTRAST  MRA HEAD WITHOUT CONTRAST  TECHNIQUE: Multiplanar, multiecho pulse sequences of the brain and surrounding structures were obtained without intravenous contrast. Angiographic images of the head were obtained using MRA technique without contrast.  COMPARISON:  Head and neck CTA 07/13/2014 and earlier.  FINDINGS: MRI HEAD FINDINGS  No restricted diffusion or evidence of acute infarction. Major intracranial vascular flow voids are stable compared to the recent CTA findings, with poor flow or occlusion of the distal left vertebral artery (series 6, image 5).  Small chronic infarct in the left cerebellar hemisphere. T2 and FLAIR hyperintensity with volume loss in the left anterior corona radiata tracking to the caudate nucleus compatible with chronic supratentorial lacunar infarct. No supratentorial cortical encephalomalacia identified. There is Wallerian degeneration evident at  the left midbrain.  No restricted diffusion to suggest acute infarction. No midline shift, mass effect, evidence  of mass lesion, ventriculomegaly, extra-axial collection or acute intracranial hemorrhage. Cervicomedullary junction and pituitary are within normal limits. Negative visualized cervical spine except for C5-C6 disc and endplate degeneration. Bone marrow signal is within normal limits. Visible internal auditory structures appear normal. Left maxillary sinus inflammation. Postoperative changes to both globes. Intubated. Visualized scalp soft tissues are within normal limits.  MRA HEAD FINDINGS  There is antegrade flow signal detected in the distal left vertebral artery which functionally terminates in PICA. This despite the coarse calcification of the left V4 segment demonstrated on the comparison. Antegrade flow in the distal right vertebral artery without stenosis. Normal right PICA. Basilar artery is patent without stenosis. SCA and PCA origins are within normal limits. Posterior communicating arteries are present. Bilateral PCA branches are within normal limits.  Antegrade flow in both ICA siphons. Mild right and moderate left ICA siphon stenosis in the cavernous and anterior genu segments (left side stenosis series 506, image 5). Carotid termini remain patent. Posterior communicating artery origins are within normal limits.  Normal MCA and left ACA origins. Dominant left A1 segment. Anterior communicating artery and visualized bilateral ACA branches are within normal limits. Visualized bilateral MCA branches are within normal limits.  IMPRESSION: 1.  No acute intracranial abnormality. 2. Chronic ischemic disease in the left basal ganglia, adjacent deep white matter capsules, and the left cerebellum. 3. Intracranial MRA congruent with the recent CTA, demonstrating distal left vertebral artery and bilateral ICA siphon atherosclerosis and stenosis, but no major circle of Willis branch occlusion.    Electronically Signed   By: Lars Pinks M.D.   On: 07/15/2014 14:59   Mr Brain Wo Contrast  07/15/2014   CLINICAL DATA:  58 year old female with history of HIV, cardiomyopathy, diabetes recently status post right below the knee amputation with decreased right side movement. Agitation. Initial encounter.  EXAM: MRI HEAD WITHOUT CONTRAST  MRA HEAD WITHOUT CONTRAST  TECHNIQUE: Multiplanar, multiecho pulse sequences of the brain and surrounding structures were obtained without intravenous contrast. Angiographic images of the head were obtained using MRA technique without contrast.  COMPARISON:  Head and neck CTA 07/13/2014 and earlier.  FINDINGS: MRI HEAD FINDINGS  No restricted diffusion or evidence of acute infarction. Major intracranial vascular flow voids are stable compared to the recent CTA findings, with poor flow or occlusion of the distal left vertebral artery (series 6, image 5).  Small chronic infarct in the left cerebellar hemisphere. T2 and FLAIR hyperintensity with volume loss in the left anterior corona radiata tracking to the caudate nucleus compatible with chronic supratentorial lacunar infarct. No supratentorial cortical encephalomalacia identified. There is Wallerian degeneration evident at the left midbrain.  No restricted diffusion to suggest acute infarction. No midline shift, mass effect, evidence of mass lesion, ventriculomegaly, extra-axial collection or acute intracranial hemorrhage. Cervicomedullary junction and pituitary are within normal limits. Negative visualized cervical spine except for C5-C6 disc and endplate degeneration. Bone marrow signal is within normal limits. Visible internal auditory structures appear normal. Left maxillary sinus inflammation. Postoperative changes to both globes. Intubated. Visualized scalp soft tissues are within normal limits.  MRA HEAD FINDINGS  There is antegrade flow signal detected in the distal left vertebral artery which functionally terminates in PICA.  This despite the coarse calcification of the left V4 segment demonstrated on the comparison. Antegrade flow in the distal right vertebral artery without stenosis. Normal right PICA. Basilar artery is patent without stenosis. SCA and PCA origins are within normal limits. Posterior communicating arteries are present. Bilateral PCA branches  are within normal limits.  Antegrade flow in both ICA siphons. Mild right and moderate left ICA siphon stenosis in the cavernous and anterior genu segments (left side stenosis series 506, image 5). Carotid termini remain patent. Posterior communicating artery origins are within normal limits.  Normal MCA and left ACA origins. Dominant left A1 segment. Anterior communicating artery and visualized bilateral ACA branches are within normal limits. Visualized bilateral MCA branches are within normal limits.  IMPRESSION: 1.  No acute intracranial abnormality. 2. Chronic ischemic disease in the left basal ganglia, adjacent deep white matter capsules, and the left cerebellum. 3. Intracranial MRA congruent with the recent CTA, demonstrating distal left vertebral artery and bilateral ICA siphon atherosclerosis and stenosis, but no major circle of Willis branch occlusion.   Electronically Signed   By: Lars Pinks M.D.   On: 07/15/2014 14:59   Dg Chest Port 1 View  07/16/2014   CLINICAL DATA:  Hypoxia  EXAM: PORTABLE CHEST - 1 VIEW  COMPARISON:  July 15, 2014  FINDINGS: Endotracheal tube tip is 3.0 cm above the carina. Central catheter tip is in the right atrium slightly beyond the cavoatrial junction. Nasogastric tube tip and side port are in the stomach. No pneumothorax. There is interstitial edema throughout the lungs diffusely, stable. No new opacity is seen. Heart is upper normal in size with pulmonary vascularity within normal limits.  IMPRESSION: Extensive interstitial edema, stable. No new opacity. Tube and catheter positions as described without apparent pneumothorax.    Electronically Signed   By: Lowella Grip M.D.   On: 07/16/2014 07:17   Dg Chest Port 1 View  07/15/2014   CLINICAL DATA:  Acute hypoxia.  EXAM: PORTABLE CHEST - 1 VIEW  COMPARISON:  07/15/2014  FINDINGS: Endotracheal tube is in place 4.8 cm above carina. Nasogastric tube has been placed, tip coiled and overlying the region of the gastric fundus. Left-sided PICC line tip overlies the level of the superior vena cava.  The heart is enlarged. There is bilateral interstitial pulmonary edema slightly increased over recent studies. No focal consolidations or pleural effusions are identified.  IMPRESSION: 1. Stable lines and tubes. 2. Increased interstitial edema.   Electronically Signed   By: Shon Hale M.D.   On: 07/15/2014 16:44         Intake/Output Summary (Last 24 hours) at 07/17/14 1121 Last data filed at 07/17/14 0900  Gross per 24 hour  Intake 2277.47 ml  Output   2510 ml  Net -232.53 ml     ASSESSMENT / PLAN: PULMONARY ETT 8/06 >>  A:  Acute hypoxemic respiratory failure secondary to pneumonia and severe acidosis.  HCAP vs aspiration pneumonia in setting of encephalopathy. ? CVA    - Worsening ARDS 07/17/14 and on PCV Doubt PCP in setting of CD4 560. BAL 07/16/14 with EOSINOHILIA and NEUTROPHILIA   P:  Goal SpO2 > 92%.  PCV mode of ventilation Start deep sedation with fent/versed and 48h nimbex on 8/11/5 Consider IV steroid for eosinophilia in BAL Check ABG post nimbx:  Consider prone ventilation if severe ARDS persists VAP bundle.  Daily SBT Trend ABG / CXR  Albuterol PRN    CARDIOVASCULAR  A:  Hypotension in setting of severe metabolic acidosis and hypovolemia. Pressor dependent as of 8/9 Sepsis, lactate reassuring. Chronic systolic heart failure, preserved systolic function EF 62% 8/6. R BKA 8/4.   - still pressor dependent  P:  DC precedex Goal MAP > 65 Vascular following for R BKA from afar  RENAL  A:  AG metabolic acidosis, likely secondary to  DKA ( urine ketones positive 8/4 ) >> Lactate is low and out of proportion with degree of acidosis  AKI    - creat improving.  P:  F/u BMET   GASTROINTESTINAL  A:  Nutrition  GIPx    - tolerating tube feeds. Stool foul smelling despite flexiseal but looks normal. C Diff negative   P:  TF at goal Protonix    HEMATOLOGIC  A:  VTE Prophylaxis  Anemia of critical illness   - rising wbc 8/11/5 P:  SCD / Heparin [place]  Trend CBC  PRBC for hgb <7gm% Check lactate, pct, liapse, CK due to rising wbc  INFECTIOUS  A:  Baseline HCV and HIV with CD4 560 on 07/12/14 Currently: Possible HCAP   Blood cx 8/4 >>> neg Sputum cx 8/6 >>>  Normal flora C diff 07/14/14 - negative Bronch BAL 07/16/14: 17 % EOS, PMN 65%, MAC 7% (Resp virus pcr 07/17/14 and cultures pending)  P Vancomycin 8/4 >>>  Zosyn 8/4 >>>  Holding HAART   ENDOCRINE  A:  DM 2  >> Likely DKA without significant hyperglycemia   P:  SSI   NEUROLOGIC  A:  Acute metabolic encephalopathy CVA Rt side hemiparesis  MRI 07/15/14 - left vertebral arterly and left ICA stenosis but without evidence of stroke. Unlikely stenotic stroke per Dr Leonie Man 07/16/14 P:  MAP > 85 Change asprin 81mg  to 325mg  per Dr Leonie Man note 07/16/14 RASS goal -3/-4, BIS < 50 with fent/versed gtt  + add 48h nimbex for severe ARDS    Today's summary: No extubation till ALI and pressor needs resolve.No family at bedside since 07/16/14    The patient is critically ill with multiple organ systems failure and requires high complexity decision making for assessment and support, frequent evaluation and titration of therapies, application of advanced monitoring technologies and extensive interpretation of multiple databases.   Critical Care Time devoted to patient care services described in this note is  40  Minutes.  Dr. Brand Males, M.D., [facility]C.P Pulmonary and Critical Care Medicine Staff Physician [facility] Pager: [phone], If no answer or between  15:00h - 7:00h: call [phone]  07/17/2014 11:21 AM

## 2014-07-17 NOTE — Progress Notes (Signed)
NUTRITION FOLLOW UP  Intervention:    Continue 69M PEPuP Protocol: change TF via OGT to Oxepa at 50 ml/h (1200 ml/day) and Prostat 30 ml once daily to provide 1900 kcals, 90 gm protein, 942 ml free water daily.  New Nutrition Dx:   Inadequate oral intake related to inability to eat as evidenced by NPO status. Ongoing.  Goal:   Intake to meet >90% of estimated nutrition needs. Met.  Monitor:   TF tolerance/adequacy, weight trend, labs, vent status.  Assessment:   58 yo female with PMH of left BKA, HIV, HTN, DM; admitted on 7/29 for right foot pain.   S/P right BKA on 8/3. NPO since procedure with some difficulty swallowing. SLP was following until intubation on 8/6.   Currently receiving TF of Glucerna 1.2 at 60 ml/h via OGT providing 1728 kcals, 86 gm protein, 1159 ml free water daily.  Discussed patient in ICU rounds today. Stool is foul smelling. C. Diff negative. Now on ARDS protocol with worsening ARDS. Will change TF to Oxepa, which contains an anti-inflammatory lipid profile (omega-3 fish oils and borage oil) and antioxidants.   Patient is currently intubated on ventilator support MV: 18.3 L/min Temp (24hrs), Avg:100.3 F (37.9 C), Min:98.7 F (37.1 C), Max:101.8 F (38.8 C)  Propofol: none  Height: Ht Readings from Last 1 Encounters:  07/10/14 5' 4" (1.626 m)    Weight Status:  Wt Readings from Last 1 Encounters:  07/17/14 136 lb 7.4 oz (61.9 kg)  07/14/14  135 lb 12.9 oz (61.6 kg)  07/11/14  162 lb 4.1 oz (73.6 kg)  06/12/2014 130 lb (58.968 kg)  Ideal weight (post bilateral leg amputations): 48.1 kg  Re-estimated needs:  Kcal: 1970 Protein: 85-100 gm Fluid: >/= 1.7 L  Skin: right leg BKA site  Diet Order: NPO   Intake/Output Summary (Last 24 hours) at 07/17/14 1417 Last data filed at 07/17/14 1300  Gross per 24 hour  Intake 2209.47 ml  Output   2360 ml  Net -150.53 ml    Last BM: 8/11 (flexiseal in place)   Labs:   Recent Labs Lab  07/13/14 0835  07/15/14 1300 07/15/14 2000 07/15/14 2200 07/16/14 0330 07/17/14 0557  NA 156*  < > 147 147  --  150* 154*  K 2.3*  < > 3.4* 3.5*  --  3.3* 3.9  CL 114*  < > 120* 117*  --  119* 125*  CO2 24  < > 18* 17*  --  18* 17*  BUN 19  < > 16 15  --  15 18  CREATININE 1.61*  < > 1.42* 1.42*  --  1.44* 1.55*  CALCIUM 7.5*  < > 6.9* 6.9*  --  6.6* 7.3*  MG 1.9  --   --   --  1.9 1.9  --   PHOS <0.5*  --  0.8* 3.2  --  3.0  --   GLUCOSE 172*  < > 221* 263*  --  209* 211*  < > = values in this interval not displayed.  CBG (last 3)   Recent Labs  07/17/14 0403 07/17/14 0801 07/17/14 1243  GLUCAP 168* 180* 178*    Scheduled Meds: . antiseptic oral rinse  7 mL Mouth Rinse QID  . artificial tears  1 application Both Eyes 3 times per day  . [START ON 07/18/2014] aspirin EC  325 mg Oral Daily  . chlorhexidine  15 mL Mouth Rinse BID  . cisatracurium  0.1 mg/kg Intravenous Once  .  dolutegravir  50 mg Oral Daily  . heparin  5,000 Units Subcutaneous 3 times per day  . insulin aspart  0-15 Units Subcutaneous 6 times per day  . insulin glargine  15 Units Subcutaneous QHS  . lamiVUDine  150 mg Oral Daily  . multivitamin with minerals  1 tablet Per Tube Daily  . pantoprazole sodium  40 mg Per Tube Q24H  . piperacillin-tazobactam (ZOSYN)  IV  3.375 g Intravenous Q8H  . sodium chloride  10-40 mL Intracatheter Q12H  . vancomycin  1,000 mg Intravenous Q24H  . zidovudine  300 mg Oral Q12H    Continuous Infusions: . cisatracurium (NIMBEX) infusion    . feeding supplement (GLUCERNA 1.2 CAL) 1,000 mL (07/17/14 1019)  . fentaNYL infusion INTRAVENOUS 250 mcg/hr (07/17/14 1400)  . midazolam (VERSED) infusion 2 mg/hr (07/17/14 1358)  . phenylephrine (NEO-SYNEPHRINE) Adult infusion 30 mcg/min (07/17/14 0600)    Molli Barrows, RD, LDN, [place] Pager [phone] After Hours Pager [phone]

## 2014-07-17 NOTE — Progress Notes (Signed)
ANTIBIOTIC CONSULT NOTE - FOLLOW UP  Pharmacy Consult for Vanc & Zosyn  Indication: rule out pneumonia  No Known Allergies  Patient Measurements: Height: 5\' 4"  (162.6 cm) Weight: 136 lb 7.4 oz (61.9 kg) IBW/kg (Calculated) : 54.7  Vital Signs: Temp: 100.3 F (37.9 C) (08/11 0803) Temp src: Oral (08/11 0803) BP: 96/61 mmHg (08/11 0800) Pulse Rate: 92 (08/11 0800) Intake/Output from previous day: 08/10 0701 - 08/11 0700 In: 2485.1 [I.V.:627.6; NG/GT:1470; IV Piggyback:387.5] Out: 2460 [Urine:2160; Stool:300] Intake/Output from this shift: Total I/O In: 271.3 [I.V.:11.3; NG/GT:60; IV Piggyback:200] Out: -   Labs:  Recent Labs  07/15/14 0505  07/15/14 2000 07/16/14 0330 07/17/14 0557  WBC 20.5*  --   --  17.6* 22.7*  HGB 8.7*  --   --  7.6* 7.7*  PLT 210  --   --  177 225  CREATININE 1.43*  < > 1.42* 1.44* 1.55*  < > = values in this interval not displayed. Estimated Creatinine Clearance: 33.7 ml/min (by C-G formula based on Cr of 1.55).  Recent Labs  07/17/14 0623  VANCOTROUGH 16.6     Microbiology: Recent Results (from the past 720 hour(s))  CULTURE, BLOOD (ROUTINE X 2)     Status: None   Collection Time    06/17/2014  2:40 PM      Result Value Ref Range Status   Specimen Description BLOOD LEFT HAND   Final   Special Requests BOTTLES DRAWN AEROBIC AND ANAEROBIC 2CC   Final   Culture  Setup Time     Final   Value: 06/20/2014 21:54     Performed at [facility]   Culture     Final   Value: NO GROWTH 5 DAYS     Performed at [facility]   Report Status 07/10/2014 FINAL   Final  MRSA PCR SCREENING     Status: Abnormal   Collection Time    06/15/2014  4:46 PM      Result Value Ref Range Status   MRSA by PCR POSITIVE (*) NEGATIVE Final   Comment:            The GeneXpert MRSA Assay (FDA     approved for NASAL specimens     only), is one component of a     comprehensive MRSA colonization     surveillance program. It is not     intended to  diagnose MRSA     infection nor to guide or     monitor treatment for     MRSA infections.     RESULT CALLED TO, READ BACK BY AND VERIFIED WITH:     Durenda Age RN 19:05 06/23/2014 ([email or web address])  CULTURE, BLOOD (ROUTINE X 2)     Status: None   Collection Time    06/23/2014  6:12 PM      Result Value Ref Range Status   Specimen Description BLOOD HAND RIGHT   Final   Special Requests BOTTLES DRAWN AEROBIC AND ANAEROBIC 2CC   Final   Culture  Setup Time     Final   Value: 07/05/2014 00:36     Performed at [facility]   Culture     Final   Value: NO GROWTH 5 DAYS     Performed at [facility]   Report Status 07/11/2014 FINAL   Final  URINE CULTURE     Status: None   Collection Time    06/30/2014 10:40 PM  Result Value Ref Range Status   Specimen Description URINE, RANDOM   Final   Special Requests NONE   Final   Culture  Setup Time     Final   Value: 06/17/2014 22:57     Performed at [facility]     Final   Value: NO GROWTH     Performed at [facility]   Culture     Final   Value: NO GROWTH     Performed at [facility]   Report Status 07/05/2014 FINAL   Final  CULTURE, BLOOD (ROUTINE X 2)     Status: None   Collection Time    07/10/14  9:20 AM      Result Value Ref Range Status   Specimen Description BLOOD RIGHT ARM   Final   Special Requests BOTTLES DRAWN AEROBIC ONLY 5CC   Final   Culture  Setup Time     Final   Value: 07/10/2014 15:54     Performed at [facility]   Culture     Final   Value: NO GROWTH 5 DAYS     Performed at [facility]   Report Status 07/16/2014 FINAL   Final  CULTURE, BLOOD (ROUTINE X 2)     Status: None   Collection Time    07/10/14  9:31 AM      Result Value Ref Range Status   Specimen Description BLOOD LEFT HAND   Final   Special Requests BOTTLES DRAWN AEROBIC AND ANAEROBIC 10CC   Final   Culture  Setup Time     Final   Value: 07/10/2014 15:54     Performed at [facility]   Culture     Final   Value: NO GROWTH 5 DAYS     Performed at [facility]   Report Status 07/16/2014 FINAL   Final  MRSA PCR SCREENING     Status: None   Collection Time    07/10/14  4:45 PM      Result Value Ref Range Status   MRSA by PCR NEGATIVE  NEGATIVE Final   Comment:            The GeneXpert MRSA Assay (FDA     approved for NASAL specimens     only), is one component of a     comprehensive MRSA colonization     surveillance program. It is not     intended to diagnose MRSA     infection nor to guide or     monitor treatment for     MRSA infections.  URINE CULTURE     Status: None   Collection Time    07/12/14  2:01 PM      Result Value Ref Range Status   Specimen Description URINE, CATHETERIZED   Final   Special Requests NONE   Final   Culture  Setup Time     Final   Value: 07/12/2014 15:20     Performed at [place]     Final   Value: NO GROWTH     Performed at [facility]   Culture     Final   Value: NO GROWTH     Performed at [facility]   Report Status 07/13/2014 FINAL   Final  CULTURE, RESPIRATORY (NON-EXPECTORATED)     Status: None   Collection Time    07/12/14  4:15 PM  Result Value Ref Range Status   Specimen Description TRACHEAL ASPIRATE   Final   Special Requests Immunocompromised   Final   Gram Stain     Final   Value: MODERATE WBC PRESENT,BOTH PMN AND MONONUCLEAR     FEW SQUAMOUS EPITHELIAL CELLS PRESENT     RARE GRAM POSITIVE COCCI     IN PAIRS RARE GRAM VARIABLE ROD     Performed at [facility]   Culture     Final   Value: Non-Pathogenic Oropharyngeal-type Flora Isolated.     Performed at [facility]   Report Status 07/15/2014 FINAL   Final  CULTURE, BLOOD (ROUTINE X 2)     Status: None   Collection Time    07/13/14  7:03 PM      Result Value Ref Range Status   Specimen Description BLOOD RIGHT HAND   Final   Special Requests     Final   Value: BOTTLES  DRAWN AEROBIC AND ANAEROBIC 10CC AER,5CC ANA   Culture  Setup Time     Final   Value: 07/14/2014 00:40     Performed at [facility]   Culture     Final   Value:        BLOOD CULTURE RECEIVED NO GROWTH TO DATE CULTURE WILL BE HELD FOR 5 DAYS BEFORE ISSUING A FINAL NEGATIVE REPORT     Performed at [facility]   Report Status PENDING   Incomplete  CULTURE, BLOOD (ROUTINE X 2)     Status: None   Collection Time    07/13/14  7:15 PM      Result Value Ref Range Status   Specimen Description BLOOD RIGHT HAND   Final   Special Requests BOTTLES DRAWN AEROBIC ONLY 8CC   Final   Culture  Setup Time     Final   Value: 07/14/2014 00:40     Performed at [facility]   Culture     Final   Value:        BLOOD CULTURE RECEIVED NO GROWTH TO DATE CULTURE WILL BE HELD FOR 5 DAYS BEFORE ISSUING A FINAL NEGATIVE REPORT     Performed at [facility]   Report Status PENDING   Incomplete  CLOSTRIDIUM DIFFICILE BY PCR     Status: None   Collection Time    07/14/14 12:29 AM      Result Value Ref Range Status   C difficile by pcr NEGATIVE  NEGATIVE Final  CULTURE, BAL-QUANTITATIVE     Status: None   Collection Time    07/16/14  3:10 PM      Result Value Ref Range Status   Specimen Description BRONCHIAL ALVEOLAR LAVAGE   Final   Special Requests Immunocompromised   Final   Gram Stain     Final   Value: RARE WBC PRESENT, PREDOMINANTLY MONONUCLEAR     NO SQUAMOUS EPITHELIAL CELLS SEEN     NO ORGANISMS SEEN     Performed at [facility] Count PENDING   Incomplete   Culture PENDING   Incomplete   Report Status PENDING   Incomplete    Anti-infectives   Start     Dose/Rate Route Frequency Ordered Stop   07/16/14 1000  lamiVUDine (EPIVIR) tablet 150 mg     150 mg Oral Daily 07/15/14 1233     07/15/14 2200  zidovudine (RETROVIR) capsule 300 mg     300 mg Oral Every 12 hours  07/15/14 1233     07/15/14 0800  vancomycin (VANCOCIN) IVPB 1000 mg/200 mL premix      1,000 mg 200 mL/hr over 60 Minutes Intravenous Every 24 hours 07/14/14 1310     07/14/14 1300  lamiVUDine-zidovudine (COMBIVIR) 150-300 MG per tablet 1 tablet  Status:  Discontinued     1 tablet Oral 2 times daily 07/14/14 1109 07/15/14 1231   07/14/14 1300  dolutegravir (TIVICAY) tablet 50 mg     50 mg Oral Daily 07/14/14 1109     07/10/14 1200  piperacillin-tazobactam (ZOSYN) IVPB 3.375 g     3.375 g 12.5 mL/hr over 240 Minutes Intravenous Every 8 hours 07/10/14 0844     07/10/14 1000  vancomycin (VANCOCIN) IVPB 750 mg/150 ml premix  Status:  Discontinued     750 mg 150 mL/hr over 60 Minutes Intravenous Every 24 hours 07/10/14 0855 07/14/14 1310   07/16/2014 2200  cefUROXime (ZINACEF) 1.5 g in dextrose 5 % 50 mL IVPB     1.5 g 100 mL/hr over 30 Minutes Intravenous Every 12 hours 07/30/2014 1423 07/10/14 1123   07/13/2014 0600  [MAR Hold]  cefUROXime (ZINACEF) 1.5 g in dextrose 5 % 50 mL IVPB     (On MAR Hold since 07/13/2014 0923)   1.5 g 100 mL/hr over 30 Minutes Intravenous On call to O.R. 07/06/14 1047 08/02/2014 1034   07/05/14 1000  emtricitabine-tenofovir (TRUVADA) 200-300 MG per tablet 1 tablet  Status:  Discontinued     1 tablet Oral Daily 06/29/2014 1616 07/02/2014 1839   06/16/2014 1700  dolutegravir (TIVICAY) tablet 50 mg  Status:  Discontinued     50 mg Oral Daily 06/15/2014 1616 06/13/2014 1839      Assessment: 58 yo F initially presented with R foot pain. 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 Vanc and Zosyn started 8/4 for possible HCAP. Known hx of HIV, HAART was held initially but was restarted 8/8. Vanc trough 8/11 therapeutic at 16.6. AoCRF this admission, SCr continues to fluctuate, now 1.55 (BL 1.2-1.5). WBC increased to 22.7 and pt spiked fever overnight.   Goal of Therapy:  Vancomycin trough level 15-20 mcg/mL  Plan:  - Continue Vanc 1g IV q24h - Continue Zosyn 3.375g IV q8h extended infusion - Monitor renal fxn, cultures, LOT, and clinical progress  Thank you for allowing pharmacy to be  part of this patient's care team  [place], Pharm.D Clinical Pharmacy Resident Pager: [phone] 07/17/2014 .8:38 AM

## 2014-07-17 NOTE — Progress Notes (Signed)
RN says patient tachy to 140 after nimbex started  ABg confirms profound resp acidosis  6.9/95/70; RT says Vt on PCV is very low  Plan Increase Vt via increasing PCV setting Stat bicarb Recheck abg 5pm  Dr. Brand Males, M.D., [facility].C.P Pulmonary and Critical Care Medicine Staff Physician [facility] Pager: [phone], If no answer or between  15:00h - 7:00h: call [phone]  07/17/2014 3:38 PM

## 2014-07-17 NOTE — Progress Notes (Signed)
[facility] Progress Note Patient Name: [name] DOB: 03/26/1956 MRN: [id number]  Date of Service  07/17/2014   HPI/Events of Note   Hypotensive.  eICU Interventions  Will give fluid bolus.   Intervention Category Major Interventions: Other:  Takari Lundahl 07/17/2014, 4:33 PM

## 2014-07-17 NOTE — Progress Notes (Signed)
Patient ID: [name], female   DOB: Jan 22, 1956, 58 y.o.   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 antiseptic oral rinse  7 mL Mouth Rinse QID  . artificial tears  1 application Both Eyes 3 times per day  . [START ON 07/18/2014] aspirin EC  325 mg Oral Daily  . chlorhexidine  15 mL Mouth Rinse BID  . cisatracurium  0.1 mg/kg Intravenous Once  . dolutegravir  50 mg Oral Daily  . heparin  5,000 Units Subcutaneous 3 times per day  . insulin aspart  0-15 Units Subcutaneous 6 times per day  . insulin glargine  15 Units Subcutaneous QHS  . lamiVUDine  150 mg Oral Daily  . multivitamin with minerals  1 tablet Per Tube Daily  . pantoprazole sodium  40 mg Per Tube Q24H  . piperacillin-tazobactam (ZOSYN)  IV  3.375 g Intravenous Q8H  . sodium chloride  10-40 mL Intracatheter Q12H  . vancomycin  1,000 mg Intravenous Q24H  . zidovudine  300 mg Oral Q12H    Objective: Temp:  [98.7 F (37.1 C)-101.8 F (38.8 C)] 98.7 F (37.1 C) (08/11 1243) Pulse Rate:  [77-103] 93 (08/11 1400) Resp:  [21-45] 25 (08/11 1400) BP: (74-114)/(48-68) 79/51 mmHg (08/11 1400) SpO2:  [94 %-100 %] 100 % (08/11 1400) FiO2 (%):  [60 %-100 %] 70 % (08/11 1400) Weight:  [136 lb 7.4 oz (61.9 kg)] 136 lb 7.4 oz (61.9 kg) (08/11 0348)  General: Sedated on the ventilator Skin: No rash Lungs: Clear anteriorly. Little sputum being suctioned Cor: Tachycardic but regular S1 and S2 with no murmur Abdomen: Soft. Diarrhea continues Joints and extremities: Small area of redness in the midportion of her right BKA  incision  Lab Results Lab Results  Component Value Date   WBC 22.7* 07/17/2014   HGB 7.7* 07/17/2014   HCT 23.7* 07/17/2014   MCV 91.9 07/17/2014   PLT 225 07/17/2014    Lab Results  Component Value Date   CREATININE 1.55* 07/17/2014   BUN 18 07/17/2014   NA 154* 07/17/2014   K 3.9 07/17/2014   CL 125* 07/17/2014   CO2 17* 07/17/2014    Lab Results  Component Value Date   ALT 17 07/17/2014   AST 26 07/17/2014   ALKPHOS 107 07/17/2014   BILITOT 0.2* 07/17/2014      Microbiology: Recent Results (from the past 240 hour(s))  CULTURE, BLOOD (ROUTINE X 2)     Status: None   Collection Time    07/10/14  9:20 AM      Result Value Ref Range Status   Specimen Description BLOOD RIGHT ARM   Final   Special Requests BOTTLES DRAWN AEROBIC ONLY 5CC   Final   Culture  Setup Time     Final   Value: 07/10/2014 15:54     Performed at [facility]   Culture     Final   Value: NO GROWTH 5 DAYS  Performed at [facility]   Report Status 07/16/2014 FINAL   Final  CULTURE, BLOOD (ROUTINE X 2)     Status: None   Collection Time    07/10/14  9:31 AM      Result Value Ref Range Status   Specimen Description BLOOD LEFT HAND   Final   Special Requests BOTTLES DRAWN AEROBIC AND ANAEROBIC 10CC   Final   Culture  Setup Time     Final   Value: 07/10/2014 15:54     Performed at [facility]   Culture     Final   Value: NO GROWTH 5 DAYS     Performed at [facility]   Report Status 07/16/2014 FINAL   Final  MRSA PCR SCREENING     Status: None   Collection Time    07/10/14  4:45 PM      Result Value Ref Range Status   MRSA by PCR NEGATIVE  NEGATIVE Final   Comment:            The GeneXpert MRSA Assay (FDA     approved for NASAL specimens     only), is one component of a     comprehensive MRSA colonization     surveillance program. It is not     intended to diagnose MRSA     infection nor to guide or     monitor treatment for     MRSA infections.  URINE CULTURE      Status: None   Collection Time    07/12/14  2:01 PM      Result Value Ref Range Status   Specimen Description URINE, CATHETERIZED   Final   Special Requests NONE   Final   Culture  Setup Time     Final   Value: 07/12/2014 15:20     Performed at [facility]     Final   Value: NO GROWTH     Performed at [facility]   Culture     Final   Value: NO GROWTH     Performed at [facility]   Report Status 07/13/2014 FINAL   Final  CULTURE, RESPIRATORY (NON-EXPECTORATED)     Status: None   Collection Time    07/12/14  4:15 PM      Result Value Ref Range Status   Specimen Description TRACHEAL ASPIRATE   Final   Special Requests Immunocompromised   Final   Gram Stain     Final   Value: MODERATE WBC PRESENT,BOTH PMN AND MONONUCLEAR     FEW SQUAMOUS EPITHELIAL CELLS PRESENT     RARE GRAM POSITIVE COCCI     IN PAIRS RARE GRAM VARIABLE ROD     Performed at [facility]   Culture     Final   Value: Non-Pathogenic Oropharyngeal-type Flora Isolated.     Performed at [facility]   Report Status 07/15/2014 FINAL   Final  CULTURE, BLOOD (ROUTINE X 2)     Status: None   Collection Time    07/13/14  7:03 PM      Result Value Ref Range Status   Specimen Description BLOOD RIGHT HAND   Final   Special Requests     Final   Value: BOTTLES DRAWN AEROBIC AND ANAEROBIC 10CC AER,5CC ANA   Culture  Setup Time     Final   Value: 07/14/2014 00:40     Performed at [facility]   Culture     Final   Value:        BLOOD CULTURE RECEIVED NO GROWTH TO DATE CULTURE WILL BE HELD FOR 5 DAYS BEFORE ISSUING A FINAL NEGATIVE REPORT     Performed at [facility]   Report Status PENDING   Incomplete  CULTURE, BLOOD (ROUTINE X 2)     Status: None   Collection Time    07/13/14  7:15 PM      Result Value Ref Range Status   Specimen Description BLOOD RIGHT HAND   Final   Special Requests BOTTLES DRAWN AEROBIC ONLY 8CC   Final   Culture  Setup  Time     Final   Value: 07/14/2014 00:40     Performed at [facility]   Culture     Final   Value:        BLOOD CULTURE RECEIVED NO GROWTH TO DATE CULTURE WILL BE HELD FOR 5 DAYS BEFORE ISSUING A FINAL NEGATIVE REPORT     Performed at [facility]   Report Status PENDING   Incomplete  CLOSTRIDIUM DIFFICILE BY PCR     Status: None   Collection Time    07/14/14 12:29 AM      Result Value Ref Range Status   C difficile by pcr NEGATIVE  NEGATIVE Final  CULTURE, BAL-QUANTITATIVE     Status: None   Collection Time    07/16/14  3:10 PM      Result Value Ref Range Status   Specimen Description BRONCHIAL ALVEOLAR LAVAGE   Final   Special Requests Immunocompromised   Final   Gram Stain     Final   Value: RARE WBC PRESENT, PREDOMINANTLY MONONUCLEAR     NO SQUAMOUS EPITHELIAL CELLS SEEN     NO ORGANISMS SEEN     Performed at [facility] PENDING   Incomplete   Culture     Final   Value: NO GROWTH 1 DAY     Performed at [facility]   Report Status PENDING   Incomplete  PNEUMOCYSTIS JIROVECI SMEAR BY DFA     Status: None   Collection Time    07/16/14  3:10 PM      Result Value Ref Range Status   Specimen Source-[facility] BRONCHIAL ALVEOLAR LAVAGE   Final   Pneumocystis jiroveci Ag NEGATIVE   Final   Comment: Performed at [facility]  AFB CULTURE WITH SMEAR     Status: None   Collection Time    07/16/14  3:34 PM      Result Value Ref Range Status   Specimen Description BRONCHIAL ALVEOLAR LAVAGE   Final   Special Requests Immunocompromised   Final   Acid Fast Smear     Final   Value: NO ACID FAST BACILLI SEEN     Performed at [facility]   Culture     Final   Value: CULTURE WILL BE EXAMINED FOR 6 WEEKS BEFORE ISSUING A FINAL REPORT     Performed at [facility]   Report Status PENDING   Incomplete    Studies/Results: Mr Virgel Paling Wo Contrast  07/15/2014   CLINICAL DATA:  58 year old female with history of  HIV, cardiomyopathy, diabetes recently status post right below the knee amputation with decreased right side movement. Agitation. Initial encounter.  EXAM: MRI HEAD WITHOUT CONTRAST  MRA HEAD WITHOUT CONTRAST  TECHNIQUE: Multiplanar, multiecho pulse  sequences of the brain and surrounding structures were obtained without intravenous contrast. Angiographic images of the head were obtained using MRA technique without contrast.  COMPARISON:  Head and neck CTA 07/13/2014 and earlier.  FINDINGS: MRI HEAD FINDINGS  No restricted diffusion or evidence of acute infarction. Major intracranial vascular flow voids are stable compared to the recent CTA findings, with poor flow or occlusion of the distal left vertebral artery (series 6, image 5).  Small chronic infarct in the left cerebellar hemisphere. T2 and FLAIR hyperintensity with volume loss in the left anterior corona radiata tracking to the caudate nucleus compatible with chronic supratentorial lacunar infarct. No supratentorial cortical encephalomalacia identified. There is Wallerian degeneration evident at the left midbrain.  No restricted diffusion to suggest acute infarction. No midline shift, mass effect, evidence of mass lesion, ventriculomegaly, extra-axial collection or acute intracranial hemorrhage. Cervicomedullary junction and pituitary are within normal limits. Negative visualized cervical spine except for C5-C6 disc and endplate degeneration. Bone marrow signal is within normal limits. Visible internal auditory structures appear normal. Left maxillary sinus inflammation. Postoperative changes to both globes. Intubated. Visualized scalp soft tissues are within normal limits.  MRA HEAD FINDINGS  There is antegrade flow signal detected in the distal left vertebral artery which functionally terminates in PICA. This despite the coarse calcification of the left V4 segment demonstrated on the comparison. Antegrade flow in the distal right vertebral artery without  stenosis. Normal right PICA. Basilar artery is patent without stenosis. SCA and PCA origins are within normal limits. Posterior communicating arteries are present. Bilateral PCA branches are within normal limits.  Antegrade flow in both ICA siphons. Mild right and moderate left ICA siphon stenosis in the cavernous and anterior genu segments (left side stenosis series 506, image 5). Carotid termini remain patent. Posterior communicating artery origins are within normal limits.  Normal MCA and left ACA origins. Dominant left A1 segment. Anterior communicating artery and visualized bilateral ACA branches are within normal limits. Visualized bilateral MCA branches are within normal limits.  IMPRESSION: 1.  No acute intracranial abnormality. 2. Chronic ischemic disease in the left basal ganglia, adjacent deep white matter capsules, and the left cerebellum. 3. Intracranial MRA congruent with the recent CTA, demonstrating distal left vertebral artery and bilateral ICA siphon atherosclerosis and stenosis, but no major circle of Willis branch occlusion.   Electronically Signed   By: Lars Pinks M.D.   On: 07/15/2014 14:59   Mr Brain Wo Contrast  07/15/2014   CLINICAL DATA:  58 year old female with history of HIV, cardiomyopathy, diabetes recently status post right below the knee amputation with decreased right side movement. Agitation. Initial encounter.  EXAM: MRI HEAD WITHOUT CONTRAST  MRA HEAD WITHOUT CONTRAST  TECHNIQUE: Multiplanar, multiecho pulse sequences of the brain and surrounding structures were obtained without intravenous contrast. Angiographic images of the head were obtained using MRA technique without contrast.  COMPARISON:  Head and neck CTA 07/13/2014 and earlier.  FINDINGS: MRI HEAD FINDINGS  No restricted diffusion or evidence of acute infarction. Major intracranial vascular flow voids are stable compared to the recent CTA findings, with poor flow or occlusion of the distal left vertebral artery (series  6, image 5).  Small chronic infarct in the left cerebellar hemisphere. T2 and FLAIR hyperintensity with volume loss in the left anterior corona radiata tracking to the caudate nucleus compatible with chronic supratentorial lacunar infarct. No supratentorial cortical encephalomalacia identified. There is Wallerian degeneration evident at the left midbrain.  No restricted diffusion to suggest acute infarction.  No midline shift, mass effect, evidence of mass lesion, ventriculomegaly, extra-axial collection or acute intracranial hemorrhage. Cervicomedullary junction and pituitary are within normal limits. Negative visualized cervical spine except for C5-C6 disc and endplate degeneration. Bone marrow signal is within normal limits. Visible internal auditory structures appear normal. Left maxillary sinus inflammation. Postoperative changes to both globes. Intubated. Visualized scalp soft tissues are within normal limits.  MRA HEAD FINDINGS  There is antegrade flow signal detected in the distal left vertebral artery which functionally terminates in PICA. This despite the coarse calcification of the left V4 segment demonstrated on the comparison. Antegrade flow in the distal right vertebral artery without stenosis. Normal right PICA. Basilar artery is patent without stenosis. SCA and PCA origins are within normal limits. Posterior communicating arteries are present. Bilateral PCA branches are within normal limits.  Antegrade flow in both ICA siphons. Mild right and moderate left ICA siphon stenosis in the cavernous and anterior genu segments (left side stenosis series 506, image 5). Carotid termini remain patent. Posterior communicating artery origins are within normal limits.  Normal MCA and left ACA origins. Dominant left A1 segment. Anterior communicating artery and visualized bilateral ACA branches are within normal limits. Visualized bilateral MCA branches are within normal limits.  IMPRESSION: 1.  No acute  intracranial abnormality. 2. Chronic ischemic disease in the left basal ganglia, adjacent deep white matter capsules, and the left cerebellum. 3. Intracranial MRA congruent with the recent CTA, demonstrating distal left vertebral artery and bilateral ICA siphon atherosclerosis and stenosis, but no major circle of Willis branch occlusion.   Electronically Signed   By: Lars Pinks M.D.   On: 07/15/2014 14:59   Dg Chest Port 1 View  07/17/2014   CLINICAL DATA:  ARDS.  EXAM: PORTABLE CHEST - 1 VIEW  COMPARISON:  07/16/2014 and 07/15/2014 and 07/12/2014  FINDINGS: Endotracheal tube and NG tube appear in good position. PICC tip in the superior vena cava.  Bilateral hazy infiltrates persist consistent with ARDS. Heart size and vascularity are normal. No effusions.  IMPRESSION: No change. Persistent diffuse bilateral pulmonary infiltrates consistent with ARDS.   Electronically Signed   By: Rozetta Nunnery M.D.   On: 07/17/2014 12:20   Dg Chest Port 1 View  07/16/2014   CLINICAL DATA:  Hypoxia  EXAM: PORTABLE CHEST - 1 VIEW  COMPARISON:  July 15, 2014  FINDINGS: Endotracheal tube tip is 3.0 cm above the carina. Central catheter tip is in the right atrium slightly beyond the cavoatrial junction. Nasogastric tube tip and side port are in the stomach. No pneumothorax. There is interstitial edema throughout the lungs diffusely, stable. No new opacity is seen. Heart is upper normal in size with pulmonary vascularity within normal limits.  IMPRESSION: Extensive interstitial edema, stable. No new opacity. Tube and catheter positions as described without apparent pneumothorax.   Electronically Signed   By: Lowella Grip M.D.   On: 07/16/2014 07:17   Dg Chest Port 1 View  07/15/2014   CLINICAL DATA:  Acute hypoxia.  EXAM: PORTABLE CHEST - 1 VIEW  COMPARISON:  07/15/2014  FINDINGS: Endotracheal tube is in place 4.8 cm above carina. Nasogastric tube has been placed, tip coiled and overlying the region of the gastric fundus.  Left-sided PICC line tip overlies the level of the superior vena cava.  The heart is enlarged. There is bilateral interstitial pulmonary edema slightly increased over recent studies. No focal consolidations or pleural effusions are identified.  IMPRESSION: 1. Stable lines and tubes. 2. Increased interstitial  edema.   Electronically Signed   By: Shon Hale M.D.   On: 07/15/2014 16:44    Assessment: She continues to have fever and ARDS. Gram stain of BAL fluid and cultures are negative at 24 hours.  Plan: 1. Consider empiric switch in antibiotic therapy tomorrow if fevers persist  Michel Bickers, MD [facility] [phone] pager   [phone] cell 07/17/2014, 2:25 PM

## 2014-07-17 NOTE — Progress Notes (Signed)
[facility] Progress Note Patient Name: [name] DOB: 1956/04/12 MRN: [id number]  Date of Service  07/17/2014   HPI/Events of Note   Hypotensive  eICU Interventions  Will give fluid bolus   Intervention Category Major Interventions: Other:  Kemiyah Tarazon 07/17/2014, 3:33 PM

## 2014-07-17 NOTE — Progress Notes (Signed)
[facility] Progress Note Patient Name: [name] DOB: Jul 28, 1956 MRN: [id number]  Date of Service  07/17/2014   HPI/Events of Note   Still has respiratory acidosis.  eICU Interventions   Will increase RR and f/u ABG.   Intervention Category Major Interventions: Other:  Shukri Nistler 07/17/2014, 5:38 PM

## 2014-07-18 ENCOUNTER — Inpatient Hospital Stay (HOSPITAL_COMMUNITY): Payer: Medicaid Other

## 2014-07-18 DIAGNOSIS — R509 Fever, unspecified: Secondary | ICD-10-CM

## 2014-07-18 DIAGNOSIS — G9340 Encephalopathy, unspecified: Secondary | ICD-10-CM

## 2014-07-18 DIAGNOSIS — E872 Acidosis, unspecified: Secondary | ICD-10-CM

## 2014-07-18 LAB — RESPIRATORY VIRUS PANEL
ADENOVIRUS: NOT DETECTED
INFLUENZA A H3: NOT DETECTED
Influenza A H1: NOT DETECTED
Influenza A: NOT DETECTED
Influenza B: NOT DETECTED
METAPNEUMOVIRUS: NOT DETECTED
PARAINFLUENZA 1 A: NOT DETECTED
Parainfluenza 2: NOT DETECTED
Parainfluenza 3: NOT DETECTED
RESPIRATORY SYNCYTIAL VIRUS A: NOT DETECTED
RESPIRATORY SYNCYTIAL VIRUS B: NOT DETECTED
RHINOVIRUS: NOT DETECTED

## 2014-07-18 LAB — POCT I-STAT 3, ART BLOOD GAS (G3+)
Acid-base deficit: 5 mmol/L — ABNORMAL HIGH (ref 0.0–2.0)
BICARBONATE: 22.3 meq/L (ref 20.0–24.0)
O2 Saturation: 97 %
PCO2 ART: 58.2 mmHg — AB (ref 35.0–45.0)
PO2 ART: 113 mmHg — AB (ref 80.0–100.0)
Patient temperature: 99
TCO2: 24 mmol/L (ref 0–100)
pH, Arterial: 7.192 — CL (ref 7.350–7.450)

## 2014-07-18 LAB — BASIC METABOLIC PANEL
Anion gap: 13 (ref 5–15)
BUN: 23 mg/dL (ref 6–23)
CO2: 19 mEq/L (ref 19–32)
Calcium: 6.7 mg/dL — ABNORMAL LOW (ref 8.4–10.5)
Chloride: 123 mEq/L — ABNORMAL HIGH (ref 96–112)
Creatinine, Ser: 1.71 mg/dL — ABNORMAL HIGH (ref 0.50–1.10)
GFR, EST AFRICAN AMERICAN: 37 mL/min — AB (ref >90)
GFR, EST NON AFRICAN AMERICAN: 32 mL/min — AB (ref >90)
Glucose, Bld: 320 mg/dL — ABNORMAL HIGH (ref 70–99)
Potassium: 4.5 mEq/L (ref 3.7–5.3)
SODIUM: 155 meq/L — AB (ref 137–147)

## 2014-07-18 LAB — CBC WITH DIFFERENTIAL/PLATELET
BASOS ABS: 0 10*3/uL (ref 0.0–0.1)
Basophils Relative: 0 % (ref 0–1)
Eosinophils Absolute: 0.4 10*3/uL (ref 0.0–0.7)
Eosinophils Relative: 1 % (ref 0–5)
HCT: 23.6 % — ABNORMAL LOW (ref 36.0–46.0)
Hemoglobin: 7.3 g/dL — ABNORMAL LOW (ref 12.0–15.0)
LYMPHS PCT: 10 % — AB (ref 12–46)
Lymphs Abs: 3.7 10*3/uL (ref 0.7–4.0)
MCH: 29.8 pg (ref 26.0–34.0)
MCHC: 30.9 g/dL (ref 30.0–36.0)
MCV: 96.3 fL (ref 78.0–100.0)
Monocytes Absolute: 2.6 10*3/uL — ABNORMAL HIGH (ref 0.1–1.0)
Monocytes Relative: 7 % (ref 3–12)
NEUTROS PCT: 82 % — AB (ref 43–77)
Neutro Abs: 30.7 10*3/uL — ABNORMAL HIGH (ref 1.7–7.7)
Platelets: 279 10*3/uL (ref 150–400)
RBC: 2.45 MIL/uL — ABNORMAL LOW (ref 3.87–5.11)
RDW: 18.6 % — AB (ref 11.5–15.5)
WBC: 37.4 10*3/uL — ABNORMAL HIGH (ref 4.0–10.5)

## 2014-07-18 LAB — BLOOD GAS, ARTERIAL
Acid-base deficit: 6.6 mmol/L — ABNORMAL HIGH (ref 0.0–2.0)
BICARBONATE: 20.5 meq/L (ref 20.0–24.0)
Drawn by: 41977
FIO2: 0.7 %
O2 Saturation: 97.7 %
PEEP/CPAP: 14 cmH2O
PH ART: 7.181 — AB (ref 7.350–7.450)
PO2 ART: 131 mmHg — AB (ref 80.0–100.0)
Patient temperature: 98.6
RATE: 28 resp/min
TCO2: 22.2 mmol/L (ref 0–100)
pCO2 arterial: 57 mmHg — ABNORMAL HIGH (ref 35.0–45.0)

## 2014-07-18 LAB — CK TOTAL AND CKMB (NOT AT ARMC)
CK TOTAL: 193 U/L — AB (ref 7–177)
CK, MB: 2 ng/mL (ref 0.3–4.0)
Relative Index: 1 (ref 0.0–2.5)

## 2014-07-18 LAB — GLUCOSE, CAPILLARY
GLUCOSE-CAPILLARY: 268 mg/dL — AB (ref 70–99)
GLUCOSE-CAPILLARY: 227 mg/dL — AB (ref 70–99)
Glucose-Capillary: 269 mg/dL — ABNORMAL HIGH (ref 70–99)
Glucose-Capillary: 322 mg/dL — ABNORMAL HIGH (ref 70–99)
Glucose-Capillary: 332 mg/dL — ABNORMAL HIGH (ref 70–99)
Glucose-Capillary: 366 mg/dL — ABNORMAL HIGH (ref 70–99)
Glucose-Capillary: 395 mg/dL — ABNORMAL HIGH (ref 70–99)

## 2014-07-18 LAB — CULTURE, BAL-QUANTITATIVE: COLONY COUNT: NO GROWTH

## 2014-07-18 LAB — GI PATHOGEN PANEL BY PCR, STOOL
C DIFFICILE TOXIN A/B: NEGATIVE
CAMPYLOBACTER BY PCR: NEGATIVE
Cryptosporidium by PCR: NEGATIVE
E COLI (ETEC) LT/ST: NEGATIVE
E COLI (STEC): NEGATIVE
E coli 0157 by PCR: NEGATIVE
G lamblia by PCR: NEGATIVE
Norovirus GI/GII: NEGATIVE
Rotavirus A by PCR: NEGATIVE
SALMONELLA BY PCR: NEGATIVE
Shigella by PCR: NEGATIVE

## 2014-07-18 LAB — CLOSTRIDIUM DIFFICILE BY PCR: Toxigenic C. Difficile by PCR: NEGATIVE

## 2014-07-18 LAB — PROTIME-INR
INR: 1.51 — ABNORMAL HIGH (ref 0.00–1.49)
PROTHROMBIN TIME: 18.2 s — AB (ref 11.6–15.2)

## 2014-07-18 LAB — PHOSPHORUS: PHOSPHORUS: 4.6 mg/dL (ref 2.3–4.6)

## 2014-07-18 LAB — CULTURE, BAL-QUANTITATIVE W GRAM STAIN: Culture: NO GROWTH

## 2014-07-18 LAB — MAGNESIUM: Magnesium: 2.4 mg/dL (ref 1.5–2.5)

## 2014-07-18 LAB — PROCALCITONIN: Procalcitonin: 1.35 ng/mL

## 2014-07-18 MED ORDER — SODIUM BICARBONATE 8.4 % IV SOLN
INTRAVENOUS | Status: DC
  Administered 2014-07-18 – 2014-07-20 (×5): via INTRAVENOUS
  Filled 2014-07-18 (×8): qty 150

## 2014-07-18 MED ORDER — NOREPINEPHRINE BITARTRATE 1 MG/ML IV SOLN
2.0000 ug/min | INTRAVENOUS | Status: DC
  Administered 2014-07-18: 15 ug/min via INTRAVENOUS
  Administered 2014-07-18: 2 ug/min via INTRAVENOUS
  Administered 2014-07-18 – 2014-07-19 (×6): 18 ug/min via INTRAVENOUS
  Administered 2014-07-19: 16 ug/min via INTRAVENOUS
  Filled 2014-07-18 (×9): qty 4

## 2014-07-18 MED ORDER — FAMOTIDINE IN NACL 20-0.9 MG/50ML-% IV SOLN
20.0000 mg | INTRAVENOUS | Status: DC
  Administered 2014-07-18 – 2014-07-21 (×4): 20 mg via INTRAVENOUS
  Filled 2014-07-18 (×4): qty 50

## 2014-07-18 MED ORDER — METHYLPREDNISOLONE SODIUM SUCC 125 MG IJ SOLR
80.0000 mg | Freq: Four times a day (QID) | INTRAMUSCULAR | Status: DC
  Administered 2014-07-18 – 2014-07-21 (×13): 80 mg via INTRAVENOUS
  Filled 2014-07-18 (×17): qty 1.28

## 2014-07-18 MED ORDER — SODIUM CHLORIDE 0.9 % IV BOLUS (SEPSIS)
500.0000 mL | Freq: Once | INTRAVENOUS | Status: AC
  Administered 2014-07-18: 500 mL via INTRAVENOUS

## 2014-07-18 MED ORDER — SODIUM CHLORIDE 0.9 % IV SOLN
INTRAVENOUS | Status: DC
  Administered 2014-07-19 (×2): via INTRAVENOUS

## 2014-07-18 NOTE — Procedures (Signed)
Central Venous Catheter Insertion Procedure Note [name] [id number] 04/19/1956  Procedure: Insertion of Central Venous Catheter Indications: Assessment of intravascular volume and Drug and/or fluid administration  Procedure Details Consent: Risks of procedure as well as the alternatives and risks of each were explained to the (patient/caregiver).  Consent for procedure obtained. Time Out: Verified patient identification, verified procedure, site/side was marked, verified correct patient position, special equipment/implants available, medications/allergies/relevent history reviewed, required imaging and test results available.  Performed  Real time [place] was used to ID and cannulate the vessel  Maximum sterile technique was used including antiseptics, cap, gloves, gown, hand hygiene, mask and sheet. Skin prep: Chlorhexidine; local anesthetic administered A antimicrobial bonded/coated triple lumen catheter was placed in the right internal jugular vein using the Seldinger technique.  Evaluation Blood flow good Complications: No apparent complications Patient did tolerate procedure well. Chest X-ray ordered to verify placement.  CXR: pending.  Turon Kilmer,PETE 07/18/2014, 10:36 AM

## 2014-07-18 NOTE — Progress Notes (Addendum)
PULMONARY / CRITICAL CARE MEDICINE   Name: [name] MRN: [id number] DOB: [birth year]    ADMISSION DATE:  06/23/2014 CONSULTATION DATE:  07/12/2014  CHIEF COMPLAINT: Hypotension, respiratory distress   INITIAL PRESENTATION: 58 yo HIV / HCV + with DM and prior left AKA who initially presented on 7/29 with right foot pain. 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 On 8/4 developed metabolic acidosis. Acute encephalopathy, leukocytosis and CXR suggestive of PNA. She was started on Abx for HCAP starting 8/4. On 8/6, developed worsening acidosis acidosis (ABG 7.09 / 17 / 71), hypotension and respiratory distress required emergent intubation by PCCM and transfer to ICU 07/12/14.    SIGNIFICANT EVENTS:  7/29 admitted for right foot pain  8/3 underwent right BKA  8/6 required emergent intubation, transfer to ICU  -> 8/5 Echo >>> EF 65-70%, no RWMA.  8/8 off insulin drip 07/15/14: On precedex but arousable. Rt side weakness 07/16/14: ARDS changed to PCV. S/p BRONCH - BAL from RML 07/17/14: Now on PCV 70% fio2, peep 14. Worening ARDS.  Sedated with precedex. Foul smell of stool despite flexiseal  ++ x 2 days. On neo 42mcg.     SUBJECTIVE/OVERNIGHT/INTERVAL HX 07/18/14: ARDS persists, fio2 60%, peep14 with acidosis persists. Needing 2 pressors. Having loose stools, fevers, and worsening renal function.   VITAL SIGNS: Temp:  [98.7 F (37.1 C)-101.2 F (38.4 C)] 99.1 F (37.3 C) (08/12 0803) Pulse Rate:  [91-134] 134 (08/12 0726) Resp:  [0-37] 28 (08/12 0726) BP: (70-109)/(40-72) 89/50 mmHg (08/12 0726) SpO2:  [91 %-100 %] 100 % (08/12 0726) Arterial Line BP: (78-105)/(50-56) 99/54 mmHg (08/12 0600) FiO2 (%):  [60 %-70 %] 60 % (08/12 0726) Weight:  [64.7 kg (142 lb 10.2 oz)] 64.7 kg (142 lb 10.2 oz) (08/12 0400) VENTILATOR SETTINGS: Vent Mode:  [-] PCV FiO2 (%):  [60 %-70 %] 60 % Set Rate:  [24 bmp-28 bmp] 28 bmp PEEP:  [14 cmH20] 14 cmH20 Plateau Pressure:  [30 cmH20-34 cmH20] 30 cmH20 INTAKE /  OUTPUT:  Intake/Output Summary (Last 24 hours) at 07/18/14 0808 Last data filed at 07/18/14 0629  Gross per 24 hour  Intake 3604.81 ml  Output   1725 ml  Net 1879.81 ml   Intake/Output     08/11 0701 - 08/12 0700 08/12 0701 - 08/13 0700   I.V. (mL/kg) 2059.7 (31.8)    NG/GT 1385    IV Piggyback 250    Total Intake(mL/kg) 3694.7 (57.1)    Urine (mL/kg/hr) 1725 (1.1)    Stool     Total Output 1725     Net +1969.7              PHYSICAL EXAMINATION: General: Chronically ill and critically appearing female, Neuro:Sedated and paralyzed -> BIS 40 HEENT: Ott/OGT/ no jvd/lan  Cardiovascular: HSR RRR, no M/R/G.  Lungs:coarse Abdomen: BS x 4, soft, NT/ND. TF at goal Musculoskeletal: Bilateral BKA's, new right sided BKA staples noted, no dressing, no edema.  Skin: Intact, cool, no rashes. GU: looks ok, foul smell of stool + despite flexiseal but significantly improved   LABS: PULMONARY  Recent Labs Lab 07/17/14 1534 07/17/14 1656 07/17/14 1918 07/17/14 2047 07/18/14 0500  PHART 6.930* 7.130* 7.162* 7.214* 7.181*  PCO2ART >100.0* 68.2* 57.7* 52.6* 57.0*  PO2ART 87.0 112.0* 120.0* 116.0* 131.0*  HCO3 20.8 22.2 19.8* 20.4 20.5  TCO2 24 24 21.6 22.0 22.2  O2SAT 84.0 96.0 96.9 97.3 97.7    CBC  Recent Labs Lab 07/16/14 0330 07/17/14 0557 07/18/14 0535  HGB 7.6* 7.7* 7.3*  HCT 22.1* 23.7* 23.6*  WBC 17.6* 22.7* 37.4*  PLT 177 225 279    COAGULATION  Recent Labs Lab 07/17/14 1128 07/18/14 0535  INR 1.66* 1.51*    CARDIAC    Recent Labs Lab 07/12/14 1505 07/12/14 2021 07/13/14 0221 07/14/14 0930 07/17/14 0410  TROPONINI <0.30 0.37* 0.99* <0.30 <0.30    Recent Labs Lab 07/17/14 0410  PROBNP 1264.0*     CHEMISTRY  Recent Labs Lab 07/12/14 1107  07/13/14 0835  07/15/14 1300 07/15/14 2000 07/15/14 2200 07/16/14 0330 07/17/14 0557 07/18/14 0535  NA  --   < > 156*  < > 147 147  --  150* 154* 155*  K  --   < > 2.3*  < > 3.4* 3.5*  --   3.3* 3.9 4.5  CL  --   < > 114*  < > 120* 117*  --  119* 125* 123*  CO2  --   < > 24  < > 18* 17*  --  18* 17* 19  GLUCOSE  --   < > 172*  < > 221* 263*  --  209* 211* 320*  BUN  --   < > 19  < > 16 15  --  15 18 23   CREATININE  --   < > 1.61*  < > 1.42* 1.42*  --  1.44* 1.55* 1.71*  CALCIUM  --   < > 7.5*  < > 6.9* 6.9*  --  6.6* 7.3* 6.7*  MG 2.6*  --  1.9  --   --   --  1.9 1.9  --  2.4  PHOS  --   --  <0.5*  --  0.8* 3.2  --  3.0  --  4.6  < > = values in this interval not displayed. Estimated Creatinine Clearance: 30.6 ml/min (by C-G formula based on Cr of 1.71).   LIVER  Recent Labs Lab 07/12/14 1445 07/13/14 0835 07/14/14 0137 07/17/14 1128 07/18/14 0535  AST  --  40* 40* 26  --   ALT  --  18 18 17   --   ALKPHOS  --  141* 122* 107  --   BILITOT  --  0.5 0.5 0.2*  --   PROT  --  6.6 5.7* 6.0  --   ALBUMIN 2.2* 2.1* 1.8* 1.6*  --   INR  --   --   --  1.66* 1.51*     INFECTIOUS  Recent Labs Lab 07/13/14 0221 07/15/14 0959  07/17/14 0410 07/17/14 1130 07/17/14 1200 07/18/14 0535  LATICACIDVEN 2.4* 1.5  --   --   --  1.4  --   PROCALCITON  --   --   < > 0.31 0.39  --  1.35  < > = values in this interval not displayed.   ENDOCRINE CBG (last 3)   Recent Labs  07/17/14 1939 07/17/14 2329 07/18/14 0346  GLUCAP 249* 269* 268*         IMAGING x48h Dg Chest Port 1 View  07/18/2014   CLINICAL DATA:  Endotracheal tube.  EXAM: PORTABLE CHEST - 1 VIEW  COMPARISON:  10/2014  FINDINGS: Endotracheal tube remains in place with tip approximately 5 cm above the carina. Left PICC terminates over the lower SVC. Enteric tube is looped in the upper abdomen with catheter lying expected region of the gastric fundus. Cardiomediastinal silhouette is within normal limits. Diffuse bilateral interstitial lung opacities do not appear significantly changed.  No pleural effusion or pneumothorax is identified.  IMPRESSION: Unchanged appearance of diffuse interstitial lung opacities.    Electronically Signed   By: Logan Bores   On: 07/18/2014 07:27   Dg Chest Port 1 View  07/17/2014   CLINICAL DATA:  ARDS.  EXAM: PORTABLE CHEST - 1 VIEW  COMPARISON:  07/16/2014 and 07/15/2014 and 07/12/2014  FINDINGS: Endotracheal tube and NG tube appear in good position. PICC tip in the superior vena cava.  Bilateral hazy infiltrates persist consistent with ARDS. Heart size and vascularity are normal. No effusions.  IMPRESSION: No change. Persistent diffuse bilateral pulmonary infiltrates consistent with ARDS.   Electronically Signed   By: Rozetta Nunnery M.D.   On: 07/17/2014 12:20         Intake/Output Summary (Last 24 hours) at 07/18/14 9935 Last data filed at 07/18/14 7017  Gross per 24 hour  Intake 3604.81 ml  Output   1725 ml  Net 1879.81 ml     ASSESSMENT / PLAN: PULMONARY ETT 8/06 >>  A:  Acute hypoxemic respiratory failure secondary to pneumonia and severe acidosis.  HCAP vs aspiration pneumonia in setting of encephalopathy. ? CVA    - ARDS since 07/16/14 persists 07/18/14 but improved PF ratio to 218 after nimbex 07/17/14. On  PCV mode  - etiology: Doubt PCP in setting of CD4 560. BAL 07/16/14 with EOSINOHILIA and NEUTROPHILIA - ? eos pna, infection etc.,    P:  Goal SpO2 > 92%. And allow permissive hypercapnia PCV mode of ventilation Start bicarb gtt for acidosis  Continue deep sedation with fent/versed and 48h nimbex since 8/11/5 STart  IV steroid for eosinophilia in BAL Consider prone ventilation if severe ARDS persists and not improving and unable to get peep below 10 by afternoon VAP bundle.  Trend ABG / CXR  Albuterol PRN    CARDIOVASCULAR  A:  Hypotension in setting of severe metabolic acidosis and hypovolemia. Pressor dependent as of 8/9 Sepsis, lactate reassuring. Chronic systolic heart failure, preserved systolic function EF 79% 8/6. 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   - 2 pressor dependent P:  Goal MAP > 65 Vascular following for R BKA from afar   RENAL  A:  AG  metabolic acidosis, likely secondary to DKA ( urine ketones positive 8/4 ) >> Lactate is low and out of proportion with degree of acidosis  AKI    - creat worsening. Mixed metabolic and respiratory acidosis; noted to be on  (tivicay, epivir, retrovir)   P:  Start bicarb gtt F/u BMET D HAART Rx due to acidosis   GASTROINTESTINAL  A:  Nutrition  GIPx    - tolerating tube feeds. Stool foul smelling has improved 07/18/14. Diarrhea persists with rising wbc   P:  TF at goal Change protonix to H2 blockade due to diarrhea Recheck c diff and stool pcr multiplex  HEMATOLOGIC  A:  VTE Prophylaxis  Anemia of critical illness   - rising wbc 8/12/5; fairly unremarkable  lactate, pct, liapse, CK on 07/17/14 P:  SCD / Heparin [place]  Trend CBC  PRBC for hgb <7gm%   INFECTIOUS  A:  Baseline HCV and HIV with CD4 560 on 07/12/14 Currently: Possible HCAP   Blood cx 8/4 >>> neg Sputum cx 8/6 >>>  Normal flora C diff 07/14/14 - negative Bronch BAL 07/16/14: 17 % EOS, PMN 65%, MAC 7% (Resp virus pcr 07/17/14 and cultures pending) Repeat Stool C diff and multiplex stool PCR 07/18/14 >>  P Vancomycin 8/4 >>>  Zosyn 8/4 >>>  HAART therapy (tivicay, epivir, retrovir)  07/14/14 >> 07/18/14 (DC HAART due to acidosis) ID to consider change of antibiotics due to ongoing fever, ? Role for non-bacterial Rx   ENDOCRINE  A:  DM 2  >> Likely DKA without significant hyperglycemia   P:  SSI   NEUROLOGIC  A:  Acute metabolic encephalopathy CVA Rt side hemiparesis  MRI 07/15/14 - left vertebral arterly and left ICA stenosis but without evidence of stroke. Unlikely stenotic stroke per Dr Leonie Man 07/16/14 P:  MAP > 85 Change asprin 81mg  to 325mg  per Dr Leonie Man note 07/16/14 RASS goal -3/-4, BIS < 50 with fent/versed gtt  + add 48h nimbex for severe ARDS    Today's summary: No extubation till ALI and pressor needs resolve.No family at bedside since 07/16/14, 07/17/14 and 07/07/14. She is very ill and likely will  not survive current illness; need to get hold of family    The patient is critically ill with multiple organ systems failure and requires high complexity decision making for assessment and support, frequent evaluation and titration of therapies, application of advanced monitoring technologies and extensive interpretation of multiple databases.   Critical Care Time devoted to patient care services described in this note is  40  Minutes.  Dr. Brand Males, M.D., [facility].C.P Pulmonary and Critical Care Medicine Staff Physician [place] [facility] Pager: [phone], If no answer or between  15:00h - 7:00h: call [phone]  07/18/2014 8:08 AM

## 2014-07-18 NOTE — Progress Notes (Signed)
Patient ID: [name], female   DOB: 05-23-1956, 58 y.o.   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 antiseptic oral rinse  7 mL Mouth Rinse QID  . artificial tears  1 application Both Eyes 3 times per day  . aspirin EC  325 mg Oral Daily  . chlorhexidine  15 mL Mouth Rinse BID  . famotidine (PEPCID) IV  20 mg Intravenous Q24H  . feeding supplement (PRO-STAT SUGAR FREE 64)  30 mL Per Tube Daily  . heparin  5,000 Units Subcutaneous 3 times per day  . insulin aspart  0-15 Units Subcutaneous 6 times per day  . insulin glargine  15 Units Subcutaneous QHS  . methylPREDNISolone (SOLU-MEDROL) injection  80 mg Intravenous Q6H  . multivitamin with minerals  1 tablet Per Tube Daily  . piperacillin-tazobactam (ZOSYN)  IV  3.375 g Intravenous Q8H  . sodium chloride  10-40 mL Intracatheter Q12H  . vancomycin  1,000 mg Intravenous Q24H    Objective: Temp:  [99.1 F (37.3 C)-101.2 F (38.4 C)] 99.9 F (37.7 C) (08/12 1317) Pulse Rate:  [112-136] 116 (08/12 1400) Resp:  [0-35] 35 (08/12 1400) BP: (70-116)/(40-72) 116/66 mmHg (08/12 1134) SpO2:  [91 %-100 %] 95 % (08/12 1400) Arterial Line BP: (78-123)/(47-62) 91/52 mmHg (08/12 1400) FiO2 (%):  [50 %-70 %] 50 % (08/12 1134) Weight:  [142 lb 10.2 oz (64.7 kg)] 142 lb 10.2 oz (64.7 kg) (08/12 0400)  General: Sedated on the ventilator Skin: No rash Lungs: Clear anteriorly. Cor: Regular S1 and S2 with no murmur Abdomen: Soft. Diarrhea persists  Lab Results Lab Results  Component Value Date   WBC 37.4* 07/18/2014   HGB 7.3* 07/18/2014   HCT  23.6* 07/18/2014   MCV 96.3 07/18/2014   PLT 279 07/18/2014    Lab Results  Component Value Date   CREATININE 1.71* 07/18/2014   BUN 23 07/18/2014   NA 155* 07/18/2014   K 4.5 07/18/2014   CL 123* 07/18/2014   CO2 19 07/18/2014    Lab Results  Component Value Date   ALT 17 07/17/2014   AST 26 07/17/2014   ALKPHOS 107 07/17/2014   BILITOT 0.2* 07/17/2014      Microbiology: Recent Results (from the past 240 hour(s))  CULTURE, BLOOD (ROUTINE X 2)     Status: None   Collection Time    07/10/14  9:20 AM      Result Value Ref Range Status   Specimen Description BLOOD RIGHT ARM   Final   Special Requests BOTTLES DRAWN AEROBIC ONLY 5CC   Final   Culture  Setup Time     Final   Value: 07/10/2014 15:54     Performed at [facility]   Culture     Final   Value: NO GROWTH 5 DAYS     Performed at [facility]   Report Status 07/16/2014 FINAL   Final  CULTURE, BLOOD (ROUTINE X 2)  Status: None   Collection Time    07/10/14  9:31 AM      Result Value Ref Range Status   Specimen Description BLOOD LEFT HAND   Final   Special Requests BOTTLES DRAWN AEROBIC AND ANAEROBIC 10CC   Final   Culture  Setup Time     Final   Value: 07/10/2014 15:54     Performed at [facility]   Culture     Final   Value: NO GROWTH 5 DAYS     Performed at [facility]   Report Status 07/16/2014 FINAL   Final  MRSA PCR SCREENING     Status: None   Collection Time    07/10/14  4:45 PM      Result Value Ref Range Status   MRSA by PCR NEGATIVE  NEGATIVE Final   Comment:            The GeneXpert MRSA Assay (FDA     approved for NASAL specimens     only), is one component of a     comprehensive MRSA colonization     surveillance program. It is not     intended to diagnose MRSA     infection nor to guide or     monitor treatment for     MRSA infections.  URINE CULTURE     Status: None   Collection Time    07/12/14  2:01 PM      Result Value Ref Range Status   Specimen  Description URINE, CATHETERIZED   Final   Special Requests NONE   Final   Culture  Setup Time     Final   Value: 07/12/2014 15:20     Performed at [facility] Count     Final   Value: NO GROWTH     Performed at [facility]   Culture     Final   Value: NO GROWTH     Performed at [facility]   Report Status 07/13/2014 FINAL   Final  CULTURE, RESPIRATORY (NON-EXPECTORATED)     Status: None   Collection Time    07/12/14  4:15 PM      Result Value Ref Range Status   Specimen Description TRACHEAL ASPIRATE   Final   Special Requests Immunocompromised   Final   Gram Stain     Final   Value: MODERATE WBC PRESENT,BOTH PMN AND MONONUCLEAR     FEW SQUAMOUS EPITHELIAL CELLS PRESENT     RARE GRAM POSITIVE COCCI     IN PAIRS RARE GRAM VARIABLE ROD     Performed at [facility]   Culture     Final   Value: Non-Pathogenic Oropharyngeal-type Flora Isolated.     Performed at [facility]   Report Status 07/15/2014 FINAL   Final  CULTURE, BLOOD (ROUTINE X 2)     Status: None   Collection Time    07/13/14  7:03 PM      Result Value Ref Range Status   Specimen Description BLOOD RIGHT HAND   Final   Special Requests     Final   Value: BOTTLES DRAWN AEROBIC AND ANAEROBIC 10CC AER,5CC ANA   Culture  Setup Time     Final   Value: 07/14/2014 00:40     Performed at [facility]   Culture     Final   Value:        BLOOD CULTURE RECEIVED NO GROWTH  TO DATE CULTURE WILL BE HELD FOR 5 DAYS BEFORE ISSUING A FINAL NEGATIVE REPORT     Performed at [facility]   Report Status PENDING   Incomplete  CULTURE, BLOOD (ROUTINE X 2)     Status: None   Collection Time    07/13/14  7:15 PM      Result Value Ref Range Status   Specimen Description BLOOD RIGHT HAND   Final   Special Requests BOTTLES DRAWN AEROBIC ONLY 8CC   Final   Culture  Setup Time     Final   Value: 07/14/2014 00:40     Performed at [facility]   Culture     Final     Value:        BLOOD CULTURE RECEIVED NO GROWTH TO DATE CULTURE WILL BE HELD FOR 5 DAYS BEFORE ISSUING A FINAL NEGATIVE REPORT     Performed at [facility]   Report Status PENDING   Incomplete  CLOSTRIDIUM DIFFICILE BY PCR     Status: None   Collection Time    07/14/14 12:29 AM      Result Value Ref Range Status   C difficile by pcr NEGATIVE  NEGATIVE Final  CULTURE, BAL-QUANTITATIVE     Status: None   Collection Time    07/16/14  3:10 PM      Result Value Ref Range Status   Specimen Description BRONCHIAL ALVEOLAR LAVAGE   Final   Special Requests Immunocompromised   Final   Gram Stain     Final   Value: RARE WBC PRESENT, PREDOMINANTLY MONONUCLEAR     NO SQUAMOUS EPITHELIAL CELLS SEEN     NO ORGANISMS SEEN     Performed at [facility] Count     Final   Value: NO GROWTH     Performed at [facility]   Culture     Final   Value: NO GROWTH 2 DAYS     Performed at [facility]   Report Status 07/18/2014 FINAL   Final  FUNGUS CULTURE W SMEAR     Status: None   Collection Time    07/16/14  3:10 PM      Result Value Ref Range Status   Specimen Description BRONCHIAL ALVEOLAR LAVAGE   Final   Special Requests Immunocompromised   Final   Fungal Smear     Final   Value: NO YEAST OR FUNGAL ELEMENTS SEEN     Performed at [facility]   Culture     Final   Value: CULTURE IN PROGRESS FOR FOUR WEEKS     Performed at [facility]   Report Status PENDING   Incomplete  LEGIONELLA CULTURE     Status: None   Collection Time    07/16/14  3:10 PM      Result Value Ref Range Status   Specimen Description BRONCHIAL ALVEOLAR LAVAGE   Final   Special Requests Immunocompromised   Final   Culture     Final   Value: NO LEGIONELLA ISOLATED, CULTURE IN PROGRESS FOR 5 DAYS     Performed at [facility]   Report Status PENDING   Incomplete  PNEUMOCYSTIS JIROVECI SMEAR BY DFA     Status: None   Collection Time    07/16/14  3:10 PM       Result Value Ref Range Status   Specimen Source-PJSRC BRONCHIAL ALVEOLAR LAVAGE   Final   Pneumocystis jiroveci  Ag NEGATIVE   Final   Comment: Performed at [facility]  AFB CULTURE WITH SMEAR     Status: None   Collection Time    07/16/14  3:34 PM      Result Value Ref Range Status   Specimen Description BRONCHIAL ALVEOLAR LAVAGE   Final   Special Requests Immunocompromised   Final   Acid Fast Smear     Final   Value: NO ACID FAST BACILLI SEEN     Performed at [facility]   Culture     Final   Value: CULTURE WILL BE EXAMINED FOR 6 WEEKS BEFORE ISSUING A FINAL REPORT     Performed at [facility]   Report Status PENDING   Incomplete  CLOSTRIDIUM DIFFICILE BY PCR     Status: None   Collection Time    07/18/14  8:29 AM      Result Value Ref Range Status   C difficile by pcr NEGATIVE  NEGATIVE Final    Studies/Results: Dg Chest Port 1 View  07/18/2014   CLINICAL DATA:  Central catheter placement  EXAM: PORTABLE CHEST - 1 VIEW  COMPARISON:  Study obtained earlier in the day  FINDINGS: There is now a right jugular catheter with the tip just beyond the cavoatrial junction in the right atrium. There is a left-sided central catheter with the tip essentially at the cavoatrial junction. Endotracheal tube tip is 3.8 cm above the carina. Nasogastric tube tip and side port are in the stomach. No pneumothorax. There is moderate generalized interstitial edema with patchy alveolar consolidation in the left base. Heart is upper normal in size with normal pulmonary vascularity. No adenopathy.  IMPRESSION: Tube and catheter positions as described without pneumothorax. Generalized interstitial edema remains with patchy airspace disease in the left base, stable. Lungs and cardiac silhouette appear unchanged compared to earlier in the day.   Electronically Signed   By: Lowella Grip M.D.   On: 07/18/2014 11:14   Dg Chest Port 1 View  07/18/2014   CLINICAL DATA:   Endotracheal tube.  EXAM: PORTABLE CHEST - 1 VIEW  COMPARISON:  10/2014  FINDINGS: Endotracheal tube remains in place with tip approximately 5 cm above the carina. Left PICC terminates over the lower SVC. Enteric tube is looped in the upper abdomen with catheter lying expected region of the gastric fundus. Cardiomediastinal silhouette is within normal limits. Diffuse bilateral interstitial lung opacities do not appear significantly changed. No pleural effusion or pneumothorax is identified.  IMPRESSION: Unchanged appearance of diffuse interstitial lung opacities.   Electronically Signed   By: Logan Bores   On: 07/18/2014 07:27   Dg Chest Port 1 View  07/17/2014   CLINICAL DATA:  ARDS.  EXAM: PORTABLE CHEST - 1 VIEW  COMPARISON:  07/16/2014 and 07/15/2014 and 07/12/2014  FINDINGS: Endotracheal tube and NG tube appear in good position. PICC tip in the superior vena cava.  Bilateral hazy infiltrates persist consistent with ARDS. Heart size and vascularity are normal. No effusions.  IMPRESSION: No change. Persistent diffuse bilateral pulmonary infiltrates consistent with ARDS.   Electronically Signed   By: Rozetta Nunnery M.D.   On: 07/17/2014 12:20    Assessment: She is worsening fever and leukocytosis despite broad empiric antibiotic therapy. BAL cultures remain negative and repeat C. difficile PCR is negative again. I will stop her antibiotics now and repeat urine and blood cultures. I agree with a trial of steroids. Also agree with holding antiretroviral  therapy in the face of her acidosis.  Plan: 1. Discontinue vancomycin and piperacillin tazobactam 2. Repeat blood and urine cultures 3. Stop antiretroviral therapy now  Michel Bickers, [place] for [place] [phone] pager   [phone] cell 07/18/2014, 2:54 PM

## 2014-07-18 NOTE — Progress Notes (Signed)
Inpatient Diabetes Program Recommendations  AACE/ADA: New Consensus Statement on Inpatient Glycemic Control (2013)  Target Ranges:  Prepandial:   less than 140 mg/dL      Peak postprandial:   less than 180 mg/dL (1-2 hours)      Critically ill patients:  140 - 180 mg/dL   Reason for Assessment:   Results for ERMIE, GLENDENNING (MRN [id number]) as of 07/18/2014 11:49  Ref. Range 07/17/2014 15:30 07/17/2014 19:39 07/17/2014 23:29 07/18/2014 03:46 07/18/2014 08:03  Glucose-Capillary Latest Range: 70-99 mg/dL 218 (H) 249 (H) 269 (H) 268 (H) 227 (H)   Consider restart of IV insulin as CBG's continue to increase.  Thanks, Adah Perl, RN, BC-ADM Inpatient Diabetes Coordinator Pager [phone]

## 2014-07-18 NOTE — Procedures (Signed)
Staff note  Supervised bedside procedure. Real time 2D ultrasound used for vein site selection, patency assessment, and needle entry. A record of image was made but could not be submitted for filing due to malfunction of printing device   Dr. Brand Males, M.D., [facility].C.P Pulmonary and Critical Care Medicine Staff Physician [place] [facility] Pager: [phone], If no answer or between  15:00h - 7:00h: call [phone]  07/18/2014 3:53 PM

## 2014-07-18 NOTE — Progress Notes (Addendum)
Gastric residual 600 ml. Jenita Seashore, notified. Instructed to follow current protocol ... Blair Hailey, RN-BC

## 2014-07-18 NOTE — Progress Notes (Signed)
BP 78-81/50; HR 120s; CVP 7-8; E-link MD notified; orders received... Blair Hailey, RN

## 2014-07-19 ENCOUNTER — Inpatient Hospital Stay (HOSPITAL_COMMUNITY): Payer: Medicaid Other

## 2014-07-19 DIAGNOSIS — J80 Acute respiratory distress syndrome: Secondary | ICD-10-CM | POA: Diagnosis present

## 2014-07-19 LAB — CBC WITH DIFFERENTIAL/PLATELET
BASOS ABS: 0 10*3/uL (ref 0.0–0.1)
Basophils Relative: 0 % (ref 0–1)
Eosinophils Absolute: 0 10*3/uL (ref 0.0–0.7)
Eosinophils Relative: 0 % (ref 0–5)
HCT: 21.2 % — ABNORMAL LOW (ref 36.0–46.0)
HEMOGLOBIN: 6.6 g/dL — AB (ref 12.0–15.0)
Lymphocytes Relative: 4 % — ABNORMAL LOW (ref 12–46)
Lymphs Abs: 1.2 10*3/uL (ref 0.7–4.0)
MCH: 29.5 pg (ref 26.0–34.0)
MCHC: 31.1 g/dL (ref 30.0–36.0)
MCV: 94.6 fL (ref 78.0–100.0)
Monocytes Absolute: 0.9 10*3/uL (ref 0.1–1.0)
Monocytes Relative: 3 % (ref 3–12)
NEUTROS ABS: 28.1 10*3/uL — AB (ref 1.7–7.7)
Neutrophils Relative %: 93 % — ABNORMAL HIGH (ref 43–77)
Platelets: 287 10*3/uL (ref 150–400)
RBC: 2.24 MIL/uL — ABNORMAL LOW (ref 3.87–5.11)
RDW: 19.3 % — AB (ref 11.5–15.5)
WBC: 30.2 10*3/uL — ABNORMAL HIGH (ref 4.0–10.5)

## 2014-07-19 LAB — URINALYSIS, ROUTINE W REFLEX MICROSCOPIC
BILIRUBIN URINE: NEGATIVE
KETONES UR: NEGATIVE mg/dL
Nitrite: NEGATIVE
PROTEIN: 30 mg/dL — AB
Specific Gravity, Urine: 1.022 (ref 1.005–1.030)
Urobilinogen, UA: 0.2 mg/dL (ref 0.0–1.0)
pH: 6 (ref 5.0–8.0)

## 2014-07-19 LAB — POCT I-STAT 3, ART BLOOD GAS (G3+)
ACID-BASE DEFICIT: 6 mmol/L — AB (ref 0.0–2.0)
ACID-BASE DEFICIT: 5 mmol/L — AB (ref 0.0–2.0)
ACID-BASE EXCESS: 1 mmol/L (ref 0.0–2.0)
Acid-Base Excess: 6 mmol/L — ABNORMAL HIGH (ref 0.0–2.0)
BICARBONATE: 21.2 meq/L (ref 20.0–24.0)
BICARBONATE: 26.5 meq/L — AB (ref 20.0–24.0)
BICARBONATE: 31.1 meq/L — AB (ref 20.0–24.0)
Bicarbonate: 27.8 mEq/L — ABNORMAL HIGH (ref 20.0–24.0)
O2 SAT: 83 %
O2 SAT: 92 %
O2 Saturation: 83 %
O2 Saturation: 86 %
PCO2 ART: 49.5 mmHg — AB (ref 35.0–45.0)
PH ART: 7.42 (ref 7.350–7.450)
PO2 ART: 71 mmHg — AB (ref 80.0–100.0)
PO2 ART: 51 mmHg — AB (ref 80.0–100.0)
Patient temperature: 100.3
TCO2: 23 mmol/L (ref 0–100)
TCO2: 31 mmol/L (ref 0–100)
TCO2: 28 mmol/L (ref 0–100)
TCO2: 33 mmol/L (ref 0–100)
pCO2 arterial: 56.1 mmHg — ABNORMAL HIGH (ref 35.0–45.0)
pCO2 arterial: 97.5 mmHg (ref 35.0–45.0)
pCO2 arterial: 48 mmHg — ABNORMAL HIGH (ref 35.0–45.0)
pH, Arterial: 7.19 — CL (ref 7.350–7.450)
pH, Arterial: 7.07 — CL (ref 7.350–7.450)
pH, Arterial: 7.342 — ABNORMAL LOW (ref 7.350–7.450)
pO2, Arterial: 62 mmHg — ABNORMAL LOW (ref 80.0–100.0)
pO2, Arterial: 74 mmHg — ABNORMAL LOW (ref 80.0–100.0)

## 2014-07-19 LAB — HIV-1 RNA QUANT-NO REFLEX-BLD
HIV 1 RNA Quant: <20 copies/mL (ref <20)
HIV-1 RNA Quant, Log: <1.3 {Log} (ref <1.30)

## 2014-07-19 LAB — GLUCOSE, CAPILLARY
GLUCOSE-CAPILLARY: 324 mg/dL — AB (ref 70–99)
GLUCOSE-CAPILLARY: 164 mg/dL — AB (ref 70–99)
GLUCOSE-CAPILLARY: 137 mg/dL — AB (ref 70–99)
Glucose-Capillary: 365 mg/dL — ABNORMAL HIGH (ref 70–99)
Glucose-Capillary: 323 mg/dL — ABNORMAL HIGH (ref 70–99)
Glucose-Capillary: 370 mg/dL — ABNORMAL HIGH (ref 70–99)
Glucose-Capillary: 297 mg/dL — ABNORMAL HIGH (ref 70–99)
Glucose-Capillary: 261 mg/dL — ABNORMAL HIGH (ref 70–99)
Glucose-Capillary: 205 mg/dL — ABNORMAL HIGH (ref 70–99)
Glucose-Capillary: 182 mg/dL — ABNORMAL HIGH (ref 70–99)
Glucose-Capillary: 141 mg/dL — ABNORMAL HIGH (ref 70–99)
Glucose-Capillary: 137 mg/dL — ABNORMAL HIGH (ref 70–99)

## 2014-07-19 LAB — URINE MICROSCOPIC-ADD ON

## 2014-07-19 LAB — URINE CULTURE
COLONY COUNT: NO GROWTH
Culture: NO GROWTH

## 2014-07-19 LAB — CBC
HEMATOCRIT: 22.6 % — AB (ref 36.0–46.0)
HEMOGLOBIN: 7.4 g/dL — AB (ref 12.0–15.0)
MCH: 29.8 pg (ref 26.0–34.0)
MCHC: 32.7 g/dL (ref 30.0–36.0)
MCV: 91.1 fL (ref 78.0–100.0)
Platelets: 228 10*3/uL (ref 150–400)
RBC: 2.48 MIL/uL — ABNORMAL LOW (ref 3.87–5.11)
RDW: 18.6 % — ABNORMAL HIGH (ref 11.5–15.5)
WBC: 22.5 10*3/uL — ABNORMAL HIGH (ref 4.0–10.5)

## 2014-07-19 LAB — HEPATITIS C VRS RNA DETECT BY PCR-QUAL: HEPATITIS C VRS RNA BY PCR-QUAL: NEGATIVE

## 2014-07-19 LAB — BASIC METABOLIC PANEL
ANION GAP: 11 (ref 5–15)
BUN: 21 mg/dL (ref 6–23)
CO2: 27 mEq/L (ref 19–32)
Calcium: 7.2 mg/dL — ABNORMAL LOW (ref 8.4–10.5)
Chloride: 113 mEq/L — ABNORMAL HIGH (ref 96–112)
Creatinine, Ser: 1.48 mg/dL — ABNORMAL HIGH (ref 0.50–1.10)
GFR calc non Af Amer: 38 mL/min — ABNORMAL LOW (ref >90)
GFR, EST AFRICAN AMERICAN: 44 mL/min — AB (ref >90)
Glucose, Bld: 378 mg/dL — ABNORMAL HIGH (ref 70–99)
POTASSIUM: 4.1 meq/L (ref 3.7–5.3)
Sodium: 151 mEq/L — ABNORMAL HIGH (ref 137–147)

## 2014-07-19 LAB — PROCALCITONIN: PROCALCITONIN: 1.27 ng/mL

## 2014-07-19 LAB — PHOSPHORUS: Phosphorus: 4 mg/dL (ref 2.3–4.6)

## 2014-07-19 LAB — PREPARE RBC (CROSSMATCH)

## 2014-07-19 LAB — MAGNESIUM: Magnesium: 2.3 mg/dL (ref 1.5–2.5)

## 2014-07-19 MED ORDER — SODIUM CHLORIDE 0.9 % IV SOLN
INTRAVENOUS | Status: DC
  Administered 2014-07-19: 3.6 [IU]/h via INTRAVENOUS
  Administered 2014-07-19: 2.6 [IU]/h via INTRAVENOUS
  Administered 2014-07-19: 3.1 [IU]/h via INTRAVENOUS
  Filled 2014-07-19 (×3): qty 1

## 2014-07-19 MED ORDER — SODIUM CHLORIDE 0.9 % IV SOLN
3.0000 ug/kg/min | INTRAVENOUS | Status: DC
  Administered 2014-07-19: 2 ug/kg/min via INTRAVENOUS
  Filled 2014-07-19: qty 20

## 2014-07-19 MED ORDER — ELDERTONIC PO ELIX
15.0000 mL | ORAL_SOLUTION | Freq: Every day | ORAL | Status: DC
  Administered 2014-07-19 – 2014-07-21 (×3): 15 mL
  Filled 2014-07-19 (×5): qty 15

## 2014-07-19 MED ORDER — DIPHENHYDRAMINE HCL 50 MG/ML IJ SOLN: 25.0000 mg | Freq: Four times a day (QID) | INTRAMUSCULAR | Status: DC | PRN

## 2014-07-19 MED ORDER — INSULIN ASPART 100 UNIT/ML ~~LOC~~ SOLN
2.0000 [IU] | SUBCUTANEOUS | Status: DC
  Administered 2014-07-19: 6 [IU] via SUBCUTANEOUS

## 2014-07-19 MED ORDER — FAMOTIDINE 40 MG/5ML PO SUSR
20.0000 mg | Freq: Every day | ORAL | Status: DC
  Administered 2014-07-22: 20 mg
  Filled 2014-07-19 (×4): qty 2.5

## 2014-07-19 MED ORDER — POTASSIUM PHOSPHATES 15 MMOLE/5ML IV SOLN
20.0000 mmol | Freq: Once | INTRAVENOUS | Status: DC
  Filled 2014-07-19: qty 6.67

## 2014-07-19 MED ORDER — PANCRELIPASE (LIP-PROT-AMYL) 12000-38000 UNITS PO CPEP
12000.0000 [IU] | ORAL_CAPSULE | Freq: Three times a day (TID) | ORAL | Status: DC
  Administered 2014-07-19 – 2014-07-22 (×10): 12000 [IU] via ORAL
  Filled 2014-07-19 (×13): qty 1

## 2014-07-19 MED ORDER — CISATRACURIUM BOLUS VIA INFUSION
2.5000 mg | Freq: Once | INTRAVENOUS | Status: DC
  Filled 2014-07-19: qty 3

## 2014-07-19 NOTE — Progress Notes (Signed)
Patient ID: [name], female   DOB: 06/25/1956, 58 y.o.   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 antiseptic oral rinse  7 mL Mouth Rinse QID  . artificial tears  1 application Both Eyes 3 times per day  . aspirin EC  325 mg Oral Daily  . chlorhexidine  15 mL Mouth Rinse BID  . famotidine  20 mg Per Tube Daily  . famotidine (PEPCID) IV  20 mg Intravenous Q24H  . feeding supplement (PRO-STAT SUGAR FREE 64)  30 mL Per Tube Daily  . geriatric multivitamins-minerals  15 mL Per Tube Daily  . heparin  5,000 Units Subcutaneous 3 times per day  . lipase/protease/amylase  12,000 Units Oral TID WC  . methylPREDNISolone (SOLU-MEDROL) injection  80 mg Intravenous Q6H  . 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                                      Lab Results Lab Results  Component Value Date   WBC 22.5* 07/19/2014   HGB 7.4* 07/19/2014   HCT 22.6* 07/19/2014   MCV 91.1 07/19/2014     PLT 228 07/19/2014    Lab Results  Component Value Date   CREATININE 1.48* 07/19/2014   BUN 21 07/19/2014   NA 151* 07/19/2014   K 4.1 07/19/2014   CL 113* 07/19/2014   CO2 27 07/19/2014    Lab Results  Component Value Date   ALT 17 07/17/2014   AST 26 07/17/2014   ALKPHOS 107 07/17/2014   BILITOT 0.2* 07/17/2014      Microbiology: Recent Results (from the past 240 hour(s))  CULTURE, BLOOD (ROUTINE X 2)     Status: None   Collection Time    07/10/14  9:20 AM      Result Value Ref Range Status   Specimen Description BLOOD RIGHT ARM   Final   Special Requests BOTTLES DRAWN AEROBIC ONLY 5CC   Final   Culture  Setup Time     Final   Value: 07/10/2014 15:54     Performed at [facility]   Culture     Final   Value: NO GROWTH 5 DAYS     Performed at [facility]  Report Status 07/16/2014 FINAL   Final  CULTURE, BLOOD (ROUTINE X 2)     Status: None   Collection Time    07/10/14  9:31 AM      Result Value Ref Range Status   Specimen Description BLOOD LEFT HAND   Final   Special Requests BOTTLES DRAWN AEROBIC AND ANAEROBIC 10CC   Final   Culture  Setup Time     Final   Value: 07/10/2014 15:54     Performed at [facility]   Culture     Final   Value: NO GROWTH 5 DAYS     Performed at [facility]   Report Status 07/16/2014 FINAL   Final  MRSA PCR SCREENING     Status: None   Collection Time    07/10/14  4:45 PM      Result Value Ref Range Status   MRSA by PCR NEGATIVE  NEGATIVE Final   Comment:            The GeneXpert MRSA Assay (FDA     approved for NASAL specimens     only), is one component of a     comprehensive MRSA colonization     surveillance program. It is not     intended to diagnose MRSA     infection nor to guide or     monitor treatment for     MRSA infections.  URINE CULTURE     Status: None   Collection Time    07/12/14  2:01 PM      Result Value Ref Range Status   Specimen Description URINE, CATHETERIZED   Final    Special Requests NONE   Final   Culture  Setup Time     Final   Value: 07/12/2014 15:20     Performed at [facility]     Final   Value: NO GROWTH     Performed at [facility]   Culture     Final   Value: NO GROWTH     Performed at [facility]   Report Status 07/13/2014 FINAL   Final  CULTURE, RESPIRATORY (NON-EXPECTORATED)     Status: None   Collection Time    07/12/14  4:15 PM      Result Value Ref Range Status   Specimen Description TRACHEAL ASPIRATE   Final   Special Requests Immunocompromised   Final   Gram Stain     Final   Value: MODERATE WBC PRESENT,BOTH PMN AND MONONUCLEAR     FEW SQUAMOUS EPITHELIAL CELLS PRESENT     RARE GRAM POSITIVE COCCI     IN PAIRS RARE GRAM VARIABLE ROD     Performed at [facility]   Culture     Final   Value: Non-Pathogenic Oropharyngeal-type Flora Isolated.     Performed at [facility]   Report Status 07/15/2014 FINAL   Final  CULTURE, BLOOD (ROUTINE X 2)     Status: None   Collection Time    07/13/14  7:03 PM      Result Value Ref Range Status   Specimen Description BLOOD RIGHT HAND   Final   Special Requests     Final   Value: BOTTLES DRAWN AEROBIC AND ANAEROBIC 10CC AER,5CC ANA   Culture  Setup Time     Final   Value: 07/14/2014 00:40     Performed at [facility]  Final   Value:        BLOOD CULTURE RECEIVED NO GROWTH TO DATE CULTURE WILL BE HELD FOR 5 DAYS BEFORE ISSUING A FINAL NEGATIVE REPORT     Performed at [facility]   Report Status PENDING   Incomplete  CULTURE, BLOOD (ROUTINE X 2)     Status: None   Collection Time    07/13/14  7:15 PM      Result Value Ref Range Status   Specimen Description BLOOD RIGHT HAND   Final   Special Requests BOTTLES DRAWN AEROBIC ONLY 8CC   Final   Culture  Setup Time     Final   Value: 07/14/2014 00:40     Performed at [facility]   Culture     Final   Value:        BLOOD CULTURE RECEIVED NO  GROWTH TO DATE CULTURE WILL BE HELD FOR 5 DAYS BEFORE ISSUING A FINAL NEGATIVE REPORT     Performed at [facility]   Report Status PENDING   Incomplete  CLOSTRIDIUM DIFFICILE BY PCR     Status: None   Collection Time    07/14/14 12:29 AM      Result Value Ref Range Status   C difficile by pcr NEGATIVE  NEGATIVE Final  CULTURE, BAL-QUANTITATIVE     Status: None   Collection Time    07/16/14  3:10 PM      Result Value Ref Range Status   Specimen Description BRONCHIAL ALVEOLAR LAVAGE   Final   Special Requests Immunocompromised   Final   Gram Stain     Final   Value: RARE WBC PRESENT, PREDOMINANTLY MONONUCLEAR     NO SQUAMOUS EPITHELIAL CELLS SEEN     NO ORGANISMS SEEN     Performed at [facility]     Final   Value: NO GROWTH     Performed at [facility]   Culture     Final   Value: NO GROWTH 2 DAYS     Performed at [facility]   Report Status 07/18/2014 FINAL   Final  FUNGUS CULTURE W SMEAR     Status: None   Collection Time    07/16/14  3:10 PM      Result Value Ref Range Status   Specimen Description BRONCHIAL ALVEOLAR LAVAGE   Final   Special Requests Immunocompromised   Final   Fungal Smear     Final   Value: NO YEAST OR FUNGAL ELEMENTS SEEN     Performed at [facility]   Culture     Final   Value: CULTURE IN PROGRESS FOR FOUR WEEKS     Performed at [facility]   Report Status PENDING   Incomplete  LEGIONELLA CULTURE     Status: None   Collection Time    07/16/14  3:10 PM      Result Value Ref Range Status   Specimen Description BRONCHIAL ALVEOLAR LAVAGE   Final   Special Requests Immunocompromised   Final   Culture     Final   Value: NO LEGIONELLA ISOLATED, CULTURE IN PROGRESS FOR 5 DAYS     Performed at [facility]   Report Status PENDING   Incomplete  PNEUMOCYSTIS JIROVECI SMEAR BY DFA     Status: None   Collection Time    07/16/14  3:10 PM      Result Value Ref Range Status  Specimen [place] BRONCHIAL ALVEOLAR LAVAGE   Final   Pneumocystis jiroveci Ag NEGATIVE   Final   Comment: Performed at [place]  AFB CULTURE WITH SMEAR     Status: None   Collection Time    07/16/14  3:34 PM      Result Value Ref Range Status   Specimen Description BRONCHIAL ALVEOLAR LAVAGE   Final   Special Requests Immunocompromised   Final   Acid Fast Smear     Final   Value: NO ACID FAST BACILLI SEEN     Performed at [facility]   Culture     Final   Value: CULTURE WILL BE EXAMINED FOR 6 WEEKS BEFORE ISSUING A FINAL REPORT     Performed at [facility]   Report Status PENDING   Incomplete  RESPIRATORY VIRUS PANEL     Status: None   Collection Time    07/17/14  5:10 PM      Result Value Ref Range Status   Source - RVPAN NASAL SWAB   Corrected   Comment: CORRECTED ON 08/12 AT 2012: PREVIOUSLY REPORTED AS NASAL SWAB   Respiratory Syncytial Virus A NOT DETECTED   Final   Respiratory Syncytial Virus B NOT DETECTED   Final   Influenza A NOT DETECTED   Final   Influenza B NOT DETECTED   Final   Parainfluenza 1 NOT DETECTED   Final   Parainfluenza 2 NOT DETECTED   Final   Parainfluenza 3 NOT DETECTED   Final   Metapneumovirus NOT DETECTED   Final   Rhinovirus NOT DETECTED   Final   Adenovirus NOT DETECTED   Final   Influenza A H1 NOT DETECTED   Final   Influenza A H3 NOT DETECTED   Final   Comment: (NOTE)           Normal Reference Range for each Analyte: NOT DETECTED     Testing performed using the Lumine[employer] xTAG Respiratory Viral Panel test     kit.     The analytical performance characteristics of this assay have been     determined by [facility].  The modifications have not been     cleared or approved by the FDA. This assay has been validated pursuant     to the CLIA regulations and is used for clinical purposes.     Performed at [facility] DIFFICILE BY PCR     Status: None   Collection Time     07/18/14  8:29 AM      Result Value Ref Range Status   C difficile by pcr NEGATIVE  NEGATIVE Final  CULTURE, BLOOD (ROUTINE X 2)     Status: None   Collection Time    07/18/14  5:30 PM      Result Value Ref Range Status   Specimen Description BLOOD A-LINE DRAW   Final   Special Requests BOTTLES DRAWN AEROBIC AND ANAEROBIC 10CC   Final   Culture  Setup Time     Final   Value: 07/18/2014 22:26     Performed at [facility]   Culture     Final   Value:        BLOOD CULTURE RECEIVED NO GROWTH TO DATE CULTURE WILL BE HELD FOR 5 DAYS BEFORE ISSUING A FINAL NEGATIVE REPORT     Performed at [facility]   Report Status PENDING   Incomplete  CULTURE, BLOOD (  ROUTINE X 2)     Status: None   Collection Time    07/18/14  5:30 PM      Result Value Ref Range Status   Specimen Description BLOOD CENTRAL LINE   Final   Special Requests BOTTLES DRAWN AEROBIC AND ANAEROBIC 10CC   Final   Culture  Setup Time     Final   Value: 07/18/2014 22:26     Performed at [facility]   Culture     Final   Value:        BLOOD CULTURE RECEIVED NO GROWTH TO DATE CULTURE WILL BE HELD FOR 5 DAYS BEFORE ISSUING A FINAL NEGATIVE REPORT     Performed at [facility]   Report Status PENDING   Incomplete    Studies/Results: Dg Chest Port 1 View  07/19/2014   CLINICAL DATA:  Evaluate endotracheal tube  EXAM: PORTABLE CHEST - 1 VIEW  COMPARISON:  Portable chest x-ray of 07/18/2014  FINDINGS: The tip of the endotracheal tube is approximately 4.3 cm above the carina. There is little change in diffuse airspace disease and basilar atelectasis. Small effusions cannot be excluded. Central venous lines are unchanged in position. Heart size is stable.  IMPRESSION: 1. Endotracheal tube tip 4.3 cm above the carina. 2. Little change in diffuse airspace disease, basilar atelectasis, and possible small effusions.   Electronically Signed   By: Ivar Drape M.D.   On: 07/19/2014 08:00   Dg Chest Port 1  View  07/18/2014   CLINICAL DATA:  Central catheter placement  EXAM: PORTABLE CHEST - 1 VIEW  COMPARISON:  Study obtained earlier in the day  FINDINGS: There is now a right jugular catheter with the tip just beyond the cavoatrial junction in the right atrium. There is a left-sided central catheter with the tip essentially at the cavoatrial junction. Endotracheal tube tip is 3.8 cm above the carina. Nasogastric tube tip and side port are in the stomach. No pneumothorax. There is moderate generalized interstitial edema with patchy alveolar consolidation in the left base. Heart is upper normal in size with normal pulmonary vascularity. No adenopathy.  IMPRESSION: Tube and catheter positions as described without pneumothorax. Generalized interstitial edema remains with patchy airspace disease in the left base, stable. Lungs and cardiac silhouette appear unchanged compared to earlier in the day.   Electronically Signed   By: Lowella Grip M.D.   On: 07/18/2014 11:14   Dg Chest Port 1 View  07/18/2014   CLINICAL DATA:  Endotracheal tube.  EXAM: PORTABLE CHEST - 1 VIEW  COMPARISON:  10/2014  FINDINGS: Endotracheal tube remains in place with tip approximately 5 cm above the carina. Left PICC terminates over the lower SVC. Enteric tube is looped in the upper abdomen with catheter lying expected region of the gastric fundus. Cardiomediastinal silhouette is within normal limits. Diffuse bilateral interstitial lung opacities do not appear significantly changed. No pleural effusion or pneumothorax is identified.  IMPRESSION: Unchanged appearance of diffuse interstitial lung opacities.   Electronically Signed   By: Logan Bores   On: 07/18/2014 07:27   Dg Abd Portable 1v  07/19/2014   CLINICAL DATA:  Abdominal distention  EXAM: PORTABLE ABDOMEN - 1 VIEW  COMPARISON:  July 13, 2014  FINDINGS: Nasogastric tube tip and side port are in the stomach. There is moderate stool throughout the colon. Overall, the bowel gas  pattern is unremarkable. No obstruction or free air is seen on this supine examination. Relative opacity in the pelvis  may represent distended urinary bladder.  There is extensive pancreatic calcification consistent with chronic pancreatitis.  IMPRESSION: Evidence of chronic pancreatitis. Question distended urinary bladder given opacity in the pelvis. Overall bowel gas pattern unremarkable. Moderate stool throughout colon diffusely. Nasogastric tube tip and side port in stomach.   Electronically Signed   By: Lowella Grip M.D.   On: 07/19/2014 10:13    Assessment: She continues to have fever and leukocytosis but no definite evidence of new, untreated infection. Recent BAL cultures remain negative. Blood and urine cultures remain negative and respiratory varus panel is negative.  Plan: 1. Continue observation off of antibiotics and antiretroviral agents for now  Michel Bickers, MD [facility] for [place] [phone] pager   [phone] cell 07/19/2014, 2:57 PM

## 2014-07-19 NOTE — Progress Notes (Signed)
[facility] ABG results given to Dr. Chase Caller.  Vent changes made per MD order.

## 2014-07-19 NOTE — Progress Notes (Signed)
PULMONARY / CRITICAL CARE MEDICINE   Name: [name] MRN: [id number] DOB: Feb 27, 1956    ADMISSION DATE:  06/11/2014 CONSULTATION DATE:  07/12/2014  CHIEF COMPLAINT: Hypotension, respiratory distress   INITIAL PRESENTATION: 58 yo HIV / HCV + with DM and prior left AKA who initially presented on 7/29 with right foot pain. 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 On 8/4 developed metabolic acidosis. Acute encephalopathy, leukocytosis and CXR suggestive of PNA. She was started on Abx for HCAP starting 8/4. On 8/6, developed worsening acidosis acidosis (ABG 7.09 / 17 / 71), hypotension and respiratory distress required emergent intubation by PCCM and transfer to ICU 07/12/14.    SIGNIFICANT EVENTS:  7/29 admitted for right foot pain  8/3 underwent right BKA  8/6 required emergent intubation, transfer to ICU  -> 8/5 Echo >>> EF 65-70%, no RWMA.  8/8 off insulin drip 07/15/14: On precedex but arousable. Rt side weakness 07/16/14: ARDS changed to PCV. S/p BRONCH - BAL from RML 07/17/14: Now on PCV 70% fio2, peep 14. Worening ARDS.  Sedated with precedex. Foul smell of stool despite flexiseal  ++ x 2 days. On neo 79mcg. Marland Kitchen BAL with 17% eos and 65% PMN 07/18/14: ARDS persists, worsening renal function. Stopped HAART due to acidosis and stopped all antibiotics    SUBJECTIVE/OVERNIGHT/INTERVAL HX 07/19/14: Off all antibiotics per ID. HAART Rx stopped yesterday due to acidosis. Started IV steroids yesterday for BAL and today and PF ratio improved significantly but sugars are high. 48h nimbex will end today  VITAL SIGNS: Temp:  [99.8 F (37.7 C)-101.9 F (38.8 C)] 100.3 F (37.9 C) (08/13 0630) Pulse Rate:  [116-133] 133 (08/13 0830) Resp:  [28-41] 35 (08/13 0630) BP: (82-127)/(46-76) 117/68 mmHg (08/13 0830) SpO2:  [92 %-100 %] 100 % (08/13 0830) Arterial Line BP: (79-142)/(45-70) 98/54 mmHg (08/13 0630) FiO2 (%):  [50 %-60 %] 50 % (08/13 0830) Weight:  [67.6 kg (149 lb 0.5 oz)] 67.6 kg (149 lb 0.5 oz)  (08/13 0325) VENTILATOR SETTINGS: Vent Mode:  [-] PCV FiO2 (%):  [50 %-60 %] 50 % Set Rate:  [35 bmp] 35 bmp PEEP:  [10 cmH20] 10 cmH20 Plateau Pressure:  [30 cmH20-33 cmH20] 31 cmH20 INTAKE / OUTPUT:  Intake/Output Summary (Last 24 hours) at 07/19/14 0904 Last data filed at 07/19/14 4098  Gross per 24 hour  Intake 6500.47 ml  Output   3185 ml  Net 3315.47 ml   Intake/Output     08/12 0701 - 08/13 0700 08/13 0701 - 08/14 0700   I.V. (mL/kg) 5378.3 (79.6)    NG/GT 1300    IV Piggyback 350    Total Intake(mL/kg) 7028.3 (104)    Urine (mL/kg/hr) 3160 (1.9)    Stool 375 (0.2)    Total Output 3535     Net +3493.3              PHYSICAL EXAMINATION: General: Chronically ill and critically appearing female, Neuro:Sedated and paralyzed -> BIS 40 HEENT: Ott/OGT/ no jvd/lan  Cardiovascular: HSR RRR, no M/R/G.  Lungs:coarse Abdomen: BS x 4, soft, NT/ND. TF at goal Musculoskeletal: Bilateral BKA's, new right sided BKA staples noted, no dressing, no edema.  Skin: Intact, cool, no rashes. GU: looks ok, foul smell of stool + despite flexiseal but significantly improved   LABS: PULMONARY  Recent Labs Lab 07/17/14 1656 07/17/14 1918 07/17/14 2047 07/18/14 0500 07/18/14 1134  PHART 7.130* 7.162* 7.214* 7.181* 7.192*  PCO2ART 68.2* 57.7* 52.6* 57.0* 58.2*  PO2ART 112.0* 120.0* 116.0* 131.0* 113.0*  HCO3 22.2 19.8* 20.4 20.5 22.3  TCO2 24 21.6 22.0 22.2 24  O2SAT 96.0 96.9 97.3 97.7 97.0    CBC  Recent Labs Lab 07/17/14 0557 07/18/14 0535 07/19/14 0545  HGB 7.7* 7.3* 6.6*  HCT 23.7* 23.6* 21.2*  WBC 22.7* 37.4* 30.2*  PLT 225 279 287    COAGULATION  Recent Labs Lab 07/17/14 1128 07/18/14 0535  INR 1.66* 1.51*    CARDIAC    Recent Labs Lab 07/12/14 1505 07/12/14 2021 07/13/14 0221 07/14/14 0930 07/17/14 0410  TROPONINI <0.30 0.37* 0.99* <0.30 <0.30    Recent Labs Lab 07/17/14 0410  PROBNP 1264.0*     CHEMISTRY  Recent Labs Lab  07/13/14 0835  07/15/14 1300 07/15/14 2000 07/15/14 2200 07/16/14 0330 07/17/14 0557 07/18/14 0535 07/19/14 0545  NA 156*  < > 147 147  --  150* 154* 155* 151*  K 2.3*  < > 3.4* 3.5*  --  3.3* 3.9 4.5 4.1  CL 114*  < > 120* 117*  --  119* 125* 123* 113*  CO2 24  < > 18* 17*  --  18* 17* 19 27  GLUCOSE 172*  < > 221* 263*  --  209* 211* 320* 378*  BUN 19  < > 16 15  --  15 18 23 21   CREATININE 1.61*  < > 1.42* 1.42*  --  1.44* 1.55* 1.71* 1.48*  CALCIUM 7.5*  < > 6.9* 6.9*  --  6.6* 7.3* 6.7* 7.2*  MG 1.9  --   --   --  1.9 1.9  --  2.4 2.3  PHOS <0.5*  --  0.8* 3.2  --  3.0  --  4.6 4.0  < > = values in this interval not displayed. Estimated Creatinine Clearance: 38.7 ml/min (by C-G formula based on Cr of 1.48).   LIVER  Recent Labs Lab 07/12/14 1445 07/13/14 0835 07/14/14 0137 07/17/14 1128 07/18/14 0535  AST  --  40* 40* 26  --   ALT  --  18 18 17   --   ALKPHOS  --  141* 122* 107  --   BILITOT  --  0.5 0.5 0.2*  --   PROT  --  6.6 5.7* 6.0  --   ALBUMIN 2.2* 2.1* 1.8* 1.6*  --   INR  --   --   --  1.66* 1.51*     INFECTIOUS  Recent Labs Lab 07/13/14 0221 07/15/14 0959  07/17/14 1130 07/17/14 1200 07/18/14 0535 07/19/14 0545  LATICACIDVEN 2.4* 1.5  --   --  1.4  --   --   PROCALCITON  --   --   < > 0.39  --  1.35 1.27  < > = values in this interval not displayed.   ENDOCRINE CBG (last 3)   Recent Labs  07/18/14 2345 07/19/14 0421 07/19/14 0819  GLUCAP 332* 324* 365*         IMAGING x48h Dg Chest Port 1 View  07/19/2014   CLINICAL DATA:  Evaluate endotracheal tube  EXAM: PORTABLE CHEST - 1 VIEW  COMPARISON:  Portable chest x-ray of 07/18/2014  FINDINGS: The tip of the endotracheal tube is approximately 4.3 cm above the carina. There is little change in diffuse airspace disease and basilar atelectasis. Small effusions cannot be excluded. Central venous lines are unchanged in position. Heart size is stable.  IMPRESSION: 1. Endotracheal tube tip  4.3 cm above the carina. 2. Little change in diffuse airspace disease, basilar atelectasis, and  possible small effusions.   Electronically Signed   By: Ivar Drape M.D.   On: 07/19/2014 08:00   Dg Chest Port 1 View  07/18/2014   CLINICAL DATA:  Central catheter placement  EXAM: PORTABLE CHEST - 1 VIEW  COMPARISON:  Study obtained earlier in the day  FINDINGS: There is now a right jugular catheter with the tip just beyond the cavoatrial junction in the right atrium. There is a left-sided central catheter with the tip essentially at the cavoatrial junction. Endotracheal tube tip is 3.8 cm above the carina. Nasogastric tube tip and side port are in the stomach. No pneumothorax. There is moderate generalized interstitial edema with patchy alveolar consolidation in the left base. Heart is upper normal in size with normal pulmonary vascularity. No adenopathy.  IMPRESSION: Tube and catheter positions as described without pneumothorax. Generalized interstitial edema remains with patchy airspace disease in the left base, stable. Lungs and cardiac silhouette appear unchanged compared to earlier in the day.   Electronically Signed   By: Lowella Grip M.D.   On: 07/18/2014 11:14   Dg Chest Port 1 View  07/18/2014   CLINICAL DATA:  Endotracheal tube.  EXAM: PORTABLE CHEST - 1 VIEW  COMPARISON:  10/2014  FINDINGS: Endotracheal tube remains in place with tip approximately 5 cm above the carina. Left PICC terminates over the lower SVC. Enteric tube is looped in the upper abdomen with catheter lying expected region of the gastric fundus. Cardiomediastinal silhouette is within normal limits. Diffuse bilateral interstitial lung opacities do not appear significantly changed. No pleural effusion or pneumothorax is identified.  IMPRESSION: Unchanged appearance of diffuse interstitial lung opacities.   Electronically Signed   By: Logan Bores   On: 07/18/2014 07:27   Dg Chest Port 1 View  07/17/2014   CLINICAL DATA:  ARDS.   EXAM: PORTABLE CHEST - 1 VIEW  COMPARISON:  07/16/2014 and 07/15/2014 and 07/12/2014  FINDINGS: Endotracheal tube and NG tube appear in good position. PICC tip in the superior vena cava.  Bilateral hazy infiltrates persist consistent with ARDS. Heart size and vascularity are normal. No effusions.  IMPRESSION: No change. Persistent diffuse bilateral pulmonary infiltrates consistent with ARDS.   Electronically Signed   By: Rozetta Nunnery M.D.   On: 07/17/2014 12:20         Intake/Output Summary (Last 24 hours) at 07/19/14 0904 Last data filed at 07/19/14 3818  Gross per 24 hour  Intake 6500.47 ml  Output   3185 ml  Net 3315.47 ml     ASSESSMENT / PLAN: PULMONARY ETT 8/06 >>  A:  Acute hypoxemic respiratory failure secondary to pneumonia and severe ARDS  -  - ARDS since 07/16/14 . On PCV mode since 07/16/14. Unclear etiology: BAL 07/17/14 with  EOSINOHILIA and NEUTROPHILIA - ? eos pna, infection etc.,   - On steroid Rx since 07/18/14  - Hypoemia clinically better 07/19/14  P:  Goal SpO2 > 92%. And allow permissive hypercapnia PCV mode of ventilation Continue  bicarb gtt for acidosis but re-evalaue after abg  Continue deep sedation with fent/versed and 48h nimbex since 8/11/5 Continue IV steroid for eosinophilia in BAL Consider prone ventilation if severe ARDS persists and not improving and unable to get peep below 10 by afternoon VAP bundle.  Trend ABG / CXR  Albuterol PRN    CARDIOVASCULAR  A:  Hypotension in setting of severe metabolic acidosis and hypovolemia. Pressor dependent as of 8/9 Sepsis, lactate reassuring. Chronic systolic heart failure, preserved systolic  function EF 70% 8/6. 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   - 2 pressor dependent as of 07/19/14 P:  Goal MAP > 65 Vascular following for R BKA from afar   RENAL  A:  AG metabolic acidosis, likely secondary to DKA ( urine ketones positive 8/4 ) >> Lactate is low and out of proportion with degree of acidosis  AKI    - creat  improving  - acidosis improved after dc of HAART Rx and starting bicarb 07/18/14   P:  Continue bicarb gtt but reevalaute after abg F/u BMET    GASTROINTESTINAL  A:  GIPx  With pepcid Diarrhea  - . Stool foul smelling has improved 07/18/14. Diarrhea persists but stoll Cdiff and multiplex PCR negative 07/18/14     - new increased TF residuals 07/19/14  P:  TF on hold; RN will rechallenge Check AXR SUP with  H2 blockade due to diarrhea   HEMATOLOGIC  A:  VTE Prophylaxis  Anemia of critical illness   - hgb < 7gm% on 07/19/14  P:  SCD / Heparin [place]  Trend CBC  PRBC for hgb <7gm%; give 1  Unit 07/19/14   INFECTIOUS  A:  Baseline HCV and HIV with CD4 560 on 07/12/14 Currently: Possible HCAP   Blood cx 8/4 >>> neg Sputum cx 8/6 >>>  Normal flora C diff 07/14/14 - negative Bronch BAL 07/16/14: 17 % EOS, PMN 65%, MAC 7% (Resp virus pcr 07/17/14 and cultures) - NEGATIVE Repeat Stool C diff and multiplex stool PCR 07/18/14 >> NEGATIVE  P Per ID consult - monitor off all anti-infectious Rx Vancomycin 8/4 >>> 8/12 Zosyn 8/4 >>> 8/12 HAART therapy (tivicay, epivir, retrovir)  07/14/14 >> 07/18/14 (DC HAART due to acidosis)  ENDOCRINE  A:  DM 2  >>    - significant hyperglycemia 07/19/14 due to steroids P: ICU hyperglcemia protocol   NEUROLOGIC  A:  Acute metabolic encephalopathy CVA Rt side hemiparesis  MRI 07/15/14 - left vertebral arterly and left ICA stenosis but without evidence of stroke. Unlikely stenotic stroke per Dr Leonie Man 07/16/14 P:  MAP > 85 Change asprin 81mg  to 325mg  per Dr Leonie Man note 07/16/14 RASS goal -3/-4, BIS < 50 with fent/versed gtt  + add 48h nimbex for severe ARDS    Today's summary: No extubation till ALI and pressor needs resolve.No family at bedside since 07/16/14, 07/17/14 and 07/07/14. She is very ill and likely will not survive current illness. Identified a son 19  Years old on 07/18/14. Patient lost custody of him 7 years ago due toher HIV diagnosis (he  is aware of this). Explained patient very critically ill and unliely to survive. Son processed info and was in grief. Further conversation therefore stopped. Wil get palliative care for goal of care  The patient is critically ill with multiple organ systems failure and requires high complexity decision making for assessment and support, frequent evaluation and titration of therapies, application of advanced monitoring technologies and extensive interpretation of multiple databases.   Critical Care Time devoted to patient care services described in this note is  40  Minutes.  Dr. Brand Males, M.D., [facility].C.P Pulmonary and Critical Care Medicine Staff Physician [facility] Pager: [phone], If no answer or between  15:00h - 7:00h: call [phone]  07/19/2014 9:04 AM

## 2014-07-19 NOTE — Progress Notes (Signed)
  Progress Note    07/19/2014 7:58 AM 10 Days Post-Op  Subjective:  Intubated/sedated  Tm 101.9 now 100.3  97% .50 FiO2  Filed Vitals:   07/19/14 0630  BP:   Pulse:   Temp: 100.3 F (37.9 C)  Resp: 35    Physical Exam: Incisions:  C/d/i with staples in tact.  There is an area of mild erythema anteriorly;   CBC    Component Value Date/Time   WBC 30.2* 07/19/2014 0545   RBC 2.24* 07/19/2014 0545   HGB 6.6* 07/19/2014 0545   HCT 21.2* 07/19/2014 0545   PLT 287 07/19/2014 0545   MCV 94.6 07/19/2014 0545   MCH 29.5 07/19/2014 0545   MCHC 31.1 07/19/2014 0545   RDW 19.3* 07/19/2014 0545   LYMPHSABS 1.2 07/19/2014 0545   MONOABS 0.9 07/19/2014 0545   EOSABS 0.0 07/19/2014 0545   BASOSABS 0.0 07/19/2014 0545    BMET    Component Value Date/Time   NA 151* 07/19/2014 0545   K 4.1 07/19/2014 0545   CL 113* 07/19/2014 0545   CO2 27 07/19/2014 0545   GLUCOSE 378* 07/19/2014 0545   BUN 21 07/19/2014 0545   CREATININE 1.48* 07/19/2014 0545   CREATININE 1.01 11/21/2013 1506   CALCIUM 7.2* 07/19/2014 0545   GFRNONAA 38* 07/19/2014 0545   GFRNONAA 62 11/21/2013 1506   GFRAA 44* 07/19/2014 0545   GFRAA 71 11/21/2013 1506    INR    Component Value Date/Time   INR 1.51* 07/18/2014 0535     Intake/Output Summary (Last 24 hours) at 07/19/14 0758 Last data filed at 07/19/14 8882  Gross per 24 hour  Intake 7028.27 ml  Output   3535 ml  Net 3493.27 ml     Assessment/Plan:  58 y.o. female is s/p right below knee amputation  10 Days Post-Op  -pt continues to have leukocytosis/fever (leukocytosis improved today).   -Right stump healing nicely. -area of mild erythema anteriorly, but do not think this is source of infection -plan per ID/CCM    Leontine Locket, PA-C [facility] [phone] 07/19/2014 7:58 AM

## 2014-07-20 ENCOUNTER — Inpatient Hospital Stay (HOSPITAL_COMMUNITY): Payer: Medicaid Other

## 2014-07-20 LAB — BASIC METABOLIC PANEL
ANION GAP: 10 (ref 5–15)
Anion gap: 12 (ref 5–15)
BUN: 27 mg/dL — AB (ref 6–23)
BUN: 30 mg/dL — AB (ref 6–23)
CHLORIDE: 111 meq/L (ref 96–112)
CO2: 31 meq/L (ref 19–32)
CO2: 32 mEq/L (ref 19–32)
Calcium: 6.8 mg/dL — ABNORMAL LOW (ref 8.4–10.5)
Calcium: 7.1 mg/dL — ABNORMAL LOW (ref 8.4–10.5)
Chloride: 114 mEq/L — ABNORMAL HIGH (ref 96–112)
Creatinine, Ser: 1.18 mg/dL — ABNORMAL HIGH (ref 0.50–1.10)
Creatinine, Ser: 1.09 mg/dL (ref 0.50–1.10)
GFR calc Af Amer: 63 mL/min — ABNORMAL LOW (ref >90)
GFR calc non Af Amer: 49 mL/min — ABNORMAL LOW (ref >90)
GFR calc non Af Amer: 54 mL/min — ABNORMAL LOW (ref >90)
GFR, EST AFRICAN AMERICAN: 57 mL/min — AB (ref >90)
GLUCOSE: 207 mg/dL — AB (ref 70–99)
GLUCOSE: 146 mg/dL — AB (ref 70–99)
POTASSIUM: 2.7 meq/L — AB (ref 3.7–5.3)
POTASSIUM: 3.3 meq/L — AB (ref 3.7–5.3)
Sodium: 154 mEq/L — ABNORMAL HIGH (ref 137–147)
Sodium: 156 mEq/L — ABNORMAL HIGH (ref 137–147)

## 2014-07-20 LAB — TYPE AND SCREEN
ABO/RH(D): A POS
Antibody Screen: NEGATIVE
UNIT DIVISION: 0

## 2014-07-20 LAB — GLUCOSE, CAPILLARY
GLUCOSE-CAPILLARY: 177 mg/dL — AB (ref 70–99)
GLUCOSE-CAPILLARY: 182 mg/dL — AB (ref 70–99)
GLUCOSE-CAPILLARY: 160 mg/dL — AB (ref 70–99)
GLUCOSE-CAPILLARY: 103 mg/dL — AB (ref 70–99)
GLUCOSE-CAPILLARY: 153 mg/dL — AB (ref 70–99)
GLUCOSE-CAPILLARY: 143 mg/dL — AB (ref 70–99)
Glucose-Capillary: 135 mg/dL — ABNORMAL HIGH (ref 70–99)
Glucose-Capillary: 147 mg/dL — ABNORMAL HIGH (ref 70–99)
Glucose-Capillary: 151 mg/dL — ABNORMAL HIGH (ref 70–99)
Glucose-Capillary: 174 mg/dL — ABNORMAL HIGH (ref 70–99)
Glucose-Capillary: 192 mg/dL — ABNORMAL HIGH (ref 70–99)
Glucose-Capillary: 124 mg/dL — ABNORMAL HIGH (ref 70–99)
Glucose-Capillary: 147 mg/dL — ABNORMAL HIGH (ref 70–99)
Glucose-Capillary: 154 mg/dL — ABNORMAL HIGH (ref 70–99)
Glucose-Capillary: 161 mg/dL — ABNORMAL HIGH (ref 70–99)
Glucose-Capillary: 168 mg/dL — ABNORMAL HIGH (ref 70–99)
Glucose-Capillary: 170 mg/dL — ABNORMAL HIGH (ref 70–99)
Glucose-Capillary: 135 mg/dL — ABNORMAL HIGH (ref 70–99)
Glucose-Capillary: 159 mg/dL — ABNORMAL HIGH (ref 70–99)
Glucose-Capillary: 166 mg/dL — ABNORMAL HIGH (ref 70–99)

## 2014-07-20 LAB — BLOOD GAS, ARTERIAL
Acid-Base Excess: 7 mmol/L — ABNORMAL HIGH (ref 0.0–2.0)
BICARBONATE: 32.2 meq/L — AB (ref 20.0–24.0)
Drawn by: 31101
FIO2: 0.5 %
O2 Saturation: 88.4 %
PCO2 ART: 56.3 mmHg — AB (ref 35.0–45.0)
PEEP: 10 cmH2O
PH ART: 7.375 (ref 7.350–7.450)
PO2 ART: 54.5 mmHg — AB (ref 80.0–100.0)
Patient temperature: 98.6
Pressure control: 30 cmH2O
RATE: 35 resp/min
TCO2: 33.9 mmol/L (ref 0–100)

## 2014-07-20 LAB — CULTURE, BLOOD (ROUTINE X 2)
CULTURE: NO GROWTH
Culture: NO GROWTH

## 2014-07-20 LAB — CBC WITH DIFFERENTIAL/PLATELET
Basophils Absolute: 0 10*3/uL (ref 0.0–0.1)
Basophils Relative: 0 % (ref 0–1)
Eosinophils Absolute: 0 10*3/uL (ref 0.0–0.7)
Eosinophils Relative: 0 % (ref 0–5)
HCT: 24.7 % — ABNORMAL LOW (ref 36.0–46.0)
HEMOGLOBIN: 8.1 g/dL — AB (ref 12.0–15.0)
LYMPHS ABS: 1.1 10*3/uL (ref 0.7–4.0)
Lymphocytes Relative: 5 % — ABNORMAL LOW (ref 12–46)
MCH: 30.1 pg (ref 26.0–34.0)
MCHC: 32.8 g/dL (ref 30.0–36.0)
MCV: 91.8 fL (ref 78.0–100.0)
Monocytes Absolute: 1.2 10*3/uL — ABNORMAL HIGH (ref 0.1–1.0)
Monocytes Relative: 5 % (ref 3–12)
NEUTROS ABS: 21.8 10*3/uL — AB (ref 1.7–7.7)
NEUTROS PCT: 90 % — AB (ref 43–77)
Platelets: 256 10*3/uL (ref 150–400)
RBC: 2.69 MIL/uL — ABNORMAL LOW (ref 3.87–5.11)
RDW: 18.8 % — ABNORMAL HIGH (ref 11.5–15.5)
WBC: 24.1 10*3/uL — ABNORMAL HIGH (ref 4.0–10.5)

## 2014-07-20 LAB — POCT I-STAT 3, ART BLOOD GAS (G3+)
ACID-BASE EXCESS: 6 mmol/L — AB (ref 0.0–2.0)
Bicarbonate: 31.8 mEq/L — ABNORMAL HIGH (ref 20.0–24.0)
O2 SAT: 92 %
Patient temperature: 97.7
TCO2: 33 mmol/L (ref 0–100)
pCO2 arterial: 47 mmHg — ABNORMAL HIGH (ref 35.0–45.0)
pH, Arterial: 7.436 (ref 7.350–7.450)
pO2, Arterial: 62 mmHg — ABNORMAL LOW (ref 80.0–100.0)

## 2014-07-20 LAB — MAGNESIUM: MAGNESIUM: 2.2 mg/dL (ref 1.5–2.5)

## 2014-07-20 LAB — PHOSPHORUS: PHOSPHORUS: 2.6 mg/dL (ref 2.3–4.6)

## 2014-07-20 MED ORDER — INSULIN ASPART 100 UNIT/ML ~~LOC~~ SOLN
0.0000 [IU] | SUBCUTANEOUS | Status: DC
  Administered 2014-07-20: 2 [IU] via SUBCUTANEOUS
  Administered 2014-07-21: 3 [IU] via SUBCUTANEOUS
  Administered 2014-07-21: 11 [IU] via SUBCUTANEOUS
  Administered 2014-07-21: 2 [IU] via SUBCUTANEOUS
  Administered 2014-07-21: 8 [IU] via SUBCUTANEOUS
  Administered 2014-07-21: 5 [IU] via SUBCUTANEOUS
  Administered 2014-07-22 (×4): 3 [IU] via SUBCUTANEOUS

## 2014-07-20 MED ORDER — POTASSIUM CHLORIDE 20 MEQ/15ML (10%) PO LIQD
40.0000 meq | Freq: Once | ORAL | Status: AC
  Administered 2014-07-20: 40 meq
  Filled 2014-07-20: qty 30

## 2014-07-20 MED ORDER — POTASSIUM CHLORIDE 20 MEQ/15ML (10%) PO LIQD
40.0000 meq | Freq: Two times a day (BID) | ORAL | Status: AC
  Administered 2014-07-20 (×2): 40 meq via ORAL
  Filled 2014-07-20 (×3): qty 30

## 2014-07-20 MED ORDER — POTASSIUM CHLORIDE 10 MEQ/50ML IV SOLN
10.0000 meq | INTRAVENOUS | Status: DC
  Administered 2014-07-20: 10 meq via INTRAVENOUS
  Filled 2014-07-20: qty 50

## 2014-07-20 NOTE — Progress Notes (Signed)
Palliative has received consult and I have left a message for Ms. Wiechmann sister, [place], and will await call back to set up meeting time for goals of care discussion. I was unable to reach her son. Thank you for this consult.   Vinie Sill, NP Palliative Medicine Team Pager # [phone] (M-F 8a-5p) Team Phone # [phone] (Nights/Weekends)

## 2014-07-20 NOTE — Progress Notes (Signed)
NUTRITION FOLLOW UP   Intervention:    Continue 20M PEPuP Protocol with TF via OGT to Oxepa at 50 ml/h (1200 ml/day) and Prostat 30 ml once daily to provide 1900 kcals, 90 gm protein, 942 ml free water daily.  New Nutrition Dx:   Inadequate oral intake related to inability to eat as evidenced by NPO status. Ongoing.  Goal:   Intake to meet >90% of estimated nutrition needs. Met.  Monitor:   TF tolerance/adequacy, weight trend, labs, vent status.  Assessment:   58 yo female with PMH of left BKA, HIV, HTN, DM; admitted on 7/29 for right foot pain.   Patient with difficulty swallowing prior to intubation on 8/6.    Nutrition focused physical exam completed.  No muscle or subcutaneous fat depletion noticed.  Currently receiving TF of Oxepa at 50 ml/h (1200 ml/day) and Prostat 30 ml once daily to provide 1900 kcals, 90 gm protein, 942 ml free water daily. Tolerating well per discussion with RN.   Continue on ARDS protocol with worsening ARDS. Oxepa contains an anti-inflammatory lipid profile (omega-3 fish oils and borage oil) and antioxidants.   Patient is currently intubated on ventilator support MV: 13.6 L/min Temp (24hrs), Avg:99.2 F (37.3 C), Min:97.8 F (36.6 C), Max:100.3 F (37.9 C)  Propofol: none  Height: Ht Readings from Last 1 Encounters:  07/10/14 5' 4"  (1.626 m)    Weight Status:  Wt Readings from Last 1 Encounters:  07/20/14 158 lb 11.7 oz (72 kg)  07/14/14  135 lb 12.9 oz (61.6 kg)  07/11/14  162 lb 4.1 oz (73.6 kg)  06/08/2014 130 lb (58.968 kg)  Ideal weight (post bilateral leg amputations): 48.1 kg  Re-estimated needs:  Kcal: 1772 Protein: 85-100 gm Fluid: >/= 1.7 L  Skin: right leg BKA site  Diet Order: NPO   Intake/Output Summary (Last 24 hours) at 07/20/14 1245 Last data filed at 07/20/14 1100  Gross per 24 hour  Intake 5505.38 ml  Output   2060 ml  Net 3445.38 ml    Last BM: 8/13 (flexiseal in place)   Labs:   Recent Labs Lab  07/18/14 0535 07/19/14 0545 07/20/14 0430  NA 155* 151* 154*  K 4.5 4.1 2.7*  CL 123* 113* 111  CO2 19 27 31   BUN 23 21 27*  CREATININE 1.71* 1.48* 1.18*  CALCIUM 6.7* 7.2* 6.8*  MG 2.4 2.3 2.2  PHOS 4.6 4.0 2.6  GLUCOSE 320* 378* 207*    CBG (last 3)   Recent Labs  07/20/14 0429 07/20/14 0532 07/20/14 1156  GLUCAP 192* 174* 160*    Scheduled Meds: . antiseptic oral rinse  7 mL Mouth Rinse QID  . artificial tears  1 application Both Eyes 3 times per day  . aspirin EC  325 mg Oral Daily  . chlorhexidine  15 mL Mouth Rinse BID  . famotidine  20 mg Per Tube Daily  . famotidine (PEPCID) IV  20 mg Intravenous Q24H  . feeding supplement (PRO-STAT SUGAR FREE 64)  30 mL Per Tube Daily  . geriatric multivitamins-minerals  15 mL Per Tube Daily  . heparin  5,000 Units Subcutaneous 3 times per day  . lipase/protease/amylase  12,000 Units Oral TID WC  . methylPREDNISolone (SOLU-MEDROL) injection  80 mg Intravenous Q6H  . potassium chloride  40 mEq Oral BID  . sodium chloride  10-40 mL Intracatheter Q12H    Continuous Infusions: . sodium chloride 10 mL/hr at 07/19/14 1415  . feeding supplement (OXEPA) 1,000 mL (  07/19/14 1332)  . fentaNYL infusion INTRAVENOUS 400 mcg/hr (07/20/14 0932)  . insulin (NOVOLIN-R) infusion 5 Units/hr (07/20/14 1214)  . midazolam (VERSED) infusion 4 mg/hr (07/20/14 0600)  . phenylephrine (NEO-SYNEPHRINE) Adult infusion 30 mcg/min (07/20/14 1221)    Molli Barrows, RD, LDN, [name] Pager [phone] After Hours Pager [phone]

## 2014-07-20 NOTE — Progress Notes (Signed)
Palliative will meet with Ms. Krasowski sister - [place], and son today 8/14 between 1300-1400 pm for goals of care.   Vinie Sill, NP Palliative Medicine Team Pager # [phone] (M-F 8a-5p) Team Phone # [phone] (Nights/Weekends)

## 2014-07-20 NOTE — Progress Notes (Signed)
Chaplain Note: Chaplain responded to Doctor and family's request for a Chaplain. Family was present in the conference room and was given news that their sister/mother would be removed from the ventilator over the weekend. Family had recently experienced a loss in June and seemed to be feeling overwhelmed with grief. Family expressed their love and pain for the current situation and said that it will be tough to let her go. Chaplain provided emotional and spiritual support through reflective listening, prayer, and encouragement. Chaplain later escorted family in to see patient. Family said their tearful goodbyes, with Chaplain present for support. Family expressed their appreciation for the visit. Chaplains available for family support when patient is removed from care.  Sheryn Bison, [place]

## 2014-07-20 NOTE — Progress Notes (Signed)
  [facility] Progress Note  07/20/2014 9:37 AM 11 Days Post-Op  Subjective:  Patient intubated and sedated   Filed Vitals:   07/20/14 0927  BP: 102/60  Pulse:   Temp:   Resp:     Physical Exam: Incisions:  Right BKA staple line clean dry and intact. Does not appear to be infected.    CBC    Component Value Date/Time   WBC 24.1* 07/20/2014 0430   RBC 2.69* 07/20/2014 0430   HGB 8.1* 07/20/2014 0430   HCT 24.7* 07/20/2014 0430   PLT 256 07/20/2014 0430   MCV 91.8 07/20/2014 0430   MCH 30.1 07/20/2014 0430   MCHC 32.8 07/20/2014 0430   RDW 18.8* 07/20/2014 0430   LYMPHSABS 1.1 07/20/2014 0430   MONOABS 1.2* 07/20/2014 0430   EOSABS 0.0 07/20/2014 0430   BASOSABS 0.0 07/20/2014 0430    BMET    Component Value Date/Time   NA 154* 07/20/2014 0430   K 2.7* 07/20/2014 0430   CL 111 07/20/2014 0430   CO2 31 07/20/2014 0430   GLUCOSE 207* 07/20/2014 0430   BUN 27* 07/20/2014 0430   CREATININE 1.18* 07/20/2014 0430   CREATININE 1.01 11/21/2013 1506   CALCIUM 6.8* 07/20/2014 0430   GFRNONAA 49* 07/20/2014 0430   GFRNONAA 62 11/21/2013 1506   GFRAA 57* 07/20/2014 0430   GFRAA 71 11/21/2013 1506    INR    Component Value Date/Time   INR 1.51* 07/18/2014 0535     Intake/Output Summary (Last 24 hours) at 07/20/14 0937 Last data filed at 07/20/14 0913  Gross per 24 hour  Intake 5444.01 ml  Output   1610 ml  Net 3834.01 ml     Assessment:  58 y.o. female is s/p:  Right below-knee amputation 11 Days Post-Op  Plan: -Continued leukocytosis. Remaining afebrile. -Right BKA does not appear to be source of infection.  -Stump incision healing well.  -Plan per primary team.    Virgina Jock, PA-C [facility] Office: [phone] Pager: [phone] 07/20/2014 9:37 AM

## 2014-07-20 NOTE — Consult Note (Signed)
Palliative Medicine Team Consult Note  58 yo woman with multiple end stage chronic medical problems who has lived for the past 7 years in a nursing home in [place], wheelchair dependent from prior amputations admitted with with ischemic right lower limb with tissue loss and infection. Post amputation she continued to decline and progressed to ARDS from sepsis and now has multiorgan failure, evidence of strokes, pressor and ventilator dependent. Her prognosis is overwhelmingly poor for any meaningful recovery. PMT consulted for goals of care.  I met with her son Vangie Bicker who is 77 years old, her sister [place], [place] and her brother Herbie [place] and BIL. This is a strong and loving family who has just recently lost their youngest sister (patients's sister) one months ago-their are actively grieving this. They all have raw grief and this feels like another blow to them.   They all agree that Louvina would never want to have a quality of life in her current condition-she never talked to them about her wishes-even though Glorious Peach tells me he tried on multiple occasions to get her to talk to him about those things. Dell tells me she has been sick his whole life- he has been her caregiver, he has saved her by calling 911, he has watched her suffer. Glorious Peach is the only one who knows about her HIV disease-the rest of the family does not.  Summary of Goals:  1. DNR 2. They have decided to shift to full comfort care on Sunday Afternoon between 2-3PM and remove mechanical ventilation. 3. The desire very aggressive symptom management after extubation  Recommendations:  1. Maintain current level of care, do not escalate- keep her sedated and comfortable-continue all interventions until Sunday afternoon.  2. Strong odor in the room, discussed ways to control this with nursing to maintain patients dignity and for the comfort of guests  3. Appreciate Chaplain presence after our meeting.  I will be available on Sunday to  assist with the RMV.  Lane Hacker, DO Palliative Medicine [phone]  Time: 4:30-6PM 90 minutes Greater than 50%  of this time was spent counseling and coordinating care related to the above assessment and plan.

## 2014-07-20 NOTE — Progress Notes (Signed)
Patient ID: [name], female   DOB: 05-28-1956, 58 y.o.   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 antiseptic oral rinse  7 mL Mouth Rinse QID  . artificial tears  1 application Both Eyes 3 times per day  . aspirin EC  325 mg Oral Daily  . chlorhexidine  15 mL Mouth Rinse BID  . famotidine  20 mg Per Tube Daily  . famotidine (PEPCID) IV  20 mg Intravenous Q24H  . feeding supplement (PRO-STAT SUGAR FREE 64)  30 mL Per Tube Daily  . geriatric multivitamins-minerals  15 mL Per Tube Daily  . heparin  5,000 Units Subcutaneous 3 times per day  . lipase/protease/amylase  12,000 Units Oral TID WC  . methylPREDNISolone (SOLU-MEDROL) injection  80 mg Intravenous Q6H  . potassium chloride  40 mEq Oral BID  . sodium chloride  10-40 mL Intracatheter Q12H    Objective: Temp:  [98.2 F (36.8 C)-100.4 F (38 C)] 98.4 F (36.9 C) (08/14 0800) Pulse Rate:  [86-132] 96 (08/14 0800) Resp:  [33-36] 35 (08/14 0800) BP: (84-142)/(60-88) 102/60 mmHg (08/14 0927) SpO2:  [91 %-99 %] 98 % (08/14 0800) Arterial Line BP: (76-145)/(52-87) 118/67 mmHg (08/14 0800) FiO2 (%):  [50 %-60 %] 60 % (08/14 0927) Weight:  [158 lb 11.7 oz (72 kg)] 158 lb 11.7 oz (72 kg) (08/14 0200)  General: Sedated on the ventilator Skin: No rash Lungs: Clear anteriorly. Very little sputum being suctioned Cor: Regular S1 and S2 with no murmur Abdomen: Soft. 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 Small area of erythema is resolving  Lab  Results Lab Results  Component Value Date   WBC 24.1* 07/20/2014   HGB 8.1* 07/20/2014   HCT 24.7* 07/20/2014   MCV 91.8 07/20/2014   PLT 256 07/20/2014    Lab Results  Component Value Date   CREATININE 1.18* 07/20/2014   BUN 27* 07/20/2014   NA 154* 07/20/2014   K 2.7* 07/20/2014   CL 111 07/20/2014   CO2 31 07/20/2014    Lab Results  Component Value Date   ALT 17 07/17/2014   AST 26 07/17/2014   ALKPHOS 107 07/17/2014   BILITOT 0.2* 07/17/2014      Microbiology: Recent Results (from the past 240 hour(s))  MRSA PCR SCREENING     Status: None   Collection Time    07/10/14  4:45 PM      Result Value Ref Range Status   MRSA by PCR NEGATIVE  NEGATIVE Final   Comment:            The GeneXpert MRSA Assay (FDA     approved for NASAL specimens     only), is one component of a     comprehensive MRSA colonization     surveillance program. It is not     intended to diagnose MRSA     infection nor to guide or  monitor treatment for     MRSA infections.  URINE CULTURE     Status: None   Collection Time    07/12/14  2:01 PM      Result Value Ref Range Status   Specimen Description URINE, CATHETERIZED   Final   Special Requests NONE   Final   Culture  Setup Time     Final   Value: 07/12/2014 15:20     Performed at [facility]     Final   Value: NO GROWTH     Performed at [facility]   Culture     Final   Value: NO GROWTH     Performed at [facility]   Report Status 07/13/2014 FINAL   Final  CULTURE, RESPIRATORY (NON-EXPECTORATED)     Status: None   Collection Time    07/12/14  4:15 PM      Result Value Ref Range Status   Specimen Description TRACHEAL ASPIRATE   Final   Special Requests Immunocompromised   Final   Gram Stain     Final   Value: MODERATE WBC PRESENT,BOTH PMN AND MONONUCLEAR     FEW SQUAMOUS EPITHELIAL CELLS PRESENT     RARE GRAM POSITIVE COCCI     IN PAIRS RARE GRAM VARIABLE ROD     Performed at [facility]    Culture     Final   Value: Non-Pathogenic Oropharyngeal-type Flora Isolated.     Performed at [facility]   Report Status 07/15/2014 FINAL   Final  CULTURE, BLOOD (ROUTINE X 2)     Status: None   Collection Time    07/13/14  7:03 PM      Result Value Ref Range Status   Specimen Description BLOOD RIGHT HAND   Final   Special Requests     Final   Value: BOTTLES DRAWN AEROBIC AND ANAEROBIC 10CC AER,5CC ANA   Culture  Setup Time     Final   Value: 07/14/2014 00:40     Performed at [facility]   Culture     Final   Value: NO GROWTH 5 DAYS     Performed at [facility]   Report Status 07/20/2014 FINAL   Final  CULTURE, BLOOD (ROUTINE X 2)     Status: None   Collection Time    07/13/14  7:15 PM      Result Value Ref Range Status   Specimen Description BLOOD RIGHT HAND   Final   Special Requests BOTTLES DRAWN AEROBIC ONLY 8CC   Final   Culture  Setup Time     Final   Value: 07/14/2014 00:40     Performed at [facility]   Culture     Final   Value: NO GROWTH 5 DAYS     Performed at [facility]   Report Status 07/20/2014 FINAL   Final  CLOSTRIDIUM DIFFICILE BY PCR     Status: None   Collection Time    07/14/14 12:29 AM      Result Value Ref Range Status   C difficile by pcr NEGATIVE  NEGATIVE Final  CULTURE, BAL-QUANTITATIVE     Status: None   Collection Time    07/16/14  3:10 PM      Result Value Ref Range Status   Specimen Description BRONCHIAL ALVEOLAR LAVAGE   Final   Special Requests Immunocompromised   Final   Gram Stain  Final   Value: RARE WBC PRESENT, PREDOMINANTLY MONONUCLEAR     NO SQUAMOUS EPITHELIAL CELLS SEEN     NO ORGANISMS SEEN     Performed at [facility]     Final   Value: NO GROWTH     Performed at [facility]   Culture     Final   Value: NO GROWTH 2 DAYS     Performed at [facility]   Report Status 07/18/2014 FINAL   Final  FUNGUS CULTURE W SMEAR     Status:  None   Collection Time    07/16/14  3:10 PM      Result Value Ref Range Status   Specimen Description BRONCHIAL ALVEOLAR LAVAGE   Final   Special Requests Immunocompromised   Final   Fungal Smear     Final   Value: NO YEAST OR FUNGAL ELEMENTS SEEN     Performed at [facility]   Culture     Final   Value: CULTURE IN PROGRESS FOR FOUR WEEKS     Performed at [facility]   Report Status PENDING   Incomplete  LEGIONELLA CULTURE     Status: None   Collection Time    07/16/14  3:10 PM      Result Value Ref Range Status   Specimen Description BRONCHIAL ALVEOLAR LAVAGE   Final   Special Requests Immunocompromised   Final   Culture     Final   Value: NO LEGIONELLA ISOLATED, CULTURE IN PROGRESS FOR 5 DAYS     Performed at [facility]   Report Status PENDING   Incomplete  PNEUMOCYSTIS JIROVECI SMEAR BY DFA     Status: None   Collection Time    07/16/14  3:10 PM      Result Value Ref Range Status   Specimen Source-PJSRC BRONCHIAL ALVEOLAR LAVAGE   Final   Pneumocystis jiroveci Ag NEGATIVE   Final   Comment: Performed at [facility]  [facility] CULTURE WITH SMEAR     Status: None   Collection Time    07/16/14  3:34 PM      Result Value Ref Range Status   Specimen Description BRONCHIAL ALVEOLAR LAVAGE   Final   Special Requests Immunocompromised   Final   Acid Fast Smear     Final   Value: NO ACID FAST BACILLI SEEN     Performed at [facility]   Culture     Final   Value: CULTURE WILL BE EXAMINED FOR 6 WEEKS BEFORE ISSUING A FINAL REPORT     Performed at [facility]   Report Status PENDING   Incomplete  RESPIRATORY VIRUS PANEL     Status: None   Collection Time    07/17/14  5:10 PM      Result Value Ref Range Status   Source - RVPAN NASAL SWAB   Corrected   Comment: CORRECTED ON 08/12 AT 2012: PREVIOUSLY REPORTED AS NASAL SWAB   Respiratory Syncytial Virus A NOT DETECTED   Final   Respiratory Syncytial Virus B NOT DETECTED    Final   Influenza A NOT DETECTED   Final   Influenza B NOT DETECTED   Final   Parainfluenza 1 NOT DETECTED   Final   Parainfluenza 2 NOT DETECTED   Final   Parainfluenza 3 NOT DETECTED   Final   Metapneumovirus NOT DETECTED   Final   Rhinovirus  NOT DETECTED   Final   Adenovirus NOT DETECTED   Final   Influenza A H1 NOT DETECTED   Final   Influenza A H3 NOT DETECTED   Final   Comment: (NOTE)           Normal Reference Range for each Analyte: NOT DETECTED     Testing performed using the Luminex xTAG Respiratory Viral Panel test     kit.     The analytical performance characteristics of this assay have been     determined by [facility].  The modifications have not been     cleared or approved by the FDA. This assay has been validated pursuant     to the CLIA regulations and is used for clinical purposes.     Performed at [facility] DIFFICILE BY PCR     Status: None   Collection Time    07/18/14  8:29 AM      Result Value Ref Range Status   C difficile by pcr NEGATIVE  NEGATIVE Final  URINE CULTURE     Status: None   Collection Time    07/18/14  3:13 PM      Result Value Ref Range Status   Specimen Description URINE, CATHETERIZED   Final   Special Requests NONE   Final   Culture  Setup Time     Final   Value: 07/18/2014 16:18     Performed at [place]     Final   Value: NO GROWTH     Performed at [facility]   Culture     Final   Value: NO GROWTH     Performed at [facility]   Report Status 07/19/2014 FINAL   Final  CULTURE, BLOOD (ROUTINE X 2)     Status: None   Collection Time    07/18/14  5:30 PM      Result Value Ref Range Status   Specimen Description BLOOD A-LINE DRAW   Final   Special Requests BOTTLES DRAWN AEROBIC AND ANAEROBIC 10CC   Final   Culture  Setup Time     Final   Value: 07/18/2014 22:26     Performed at [facility]   Culture     Final   Value:        BLOOD CULTURE  RECEIVED NO GROWTH TO DATE CULTURE WILL BE HELD FOR 5 DAYS BEFORE ISSUING A FINAL NEGATIVE REPORT     Performed at [facility]   Report Status PENDING   Incomplete  CULTURE, BLOOD (ROUTINE X 2)     Status: None   Collection Time    07/18/14  5:30 PM      Result Value Ref Range Status   Specimen Description BLOOD CENTRAL LINE   Final   Special Requests BOTTLES DRAWN AEROBIC AND ANAEROBIC 10CC   Final   Culture  Setup Time     Final   Value: 07/18/2014 22:26     Performed at [facility]   Culture     Final   Value:        BLOOD CULTURE RECEIVED NO GROWTH TO DATE CULTURE WILL BE HELD FOR 5 DAYS BEFORE ISSUING A FINAL NEGATIVE REPORT     Performed at [facility]   Report Status PENDING   Incomplete    Studies/Results: Dg Chest Port 1 View  07/20/2014   CLINICAL DATA:  Acute respiratory  failure. On ventilator.  EXAM: PORTABLE CHEST - 1 VIEW  COMPARISON:  07/19/2014  FINDINGS: Support lines and tubes in appropriate position. No evidence of pneumothorax.  Asymmetric airspace disease is seen involving the left lung greater than right, without significant change allowing for differences in radiographic technique and patient positioning.  Mild increase in right basilar atelectasis seen. Small right pleural effusion cannot be excluded. Heart size remains stable.  IMPRESSION: No significant change in asymmetric airspace disease involving left lung greater than right.  Mild increase in right basilar atelectasis.   Electronically Signed   By: Earle Gell M.D.   On: 07/20/2014 07:35   Dg Chest Port 1 View  07/19/2014   CLINICAL DATA:  Evaluate endotracheal tube  EXAM: PORTABLE CHEST - 1 VIEW  COMPARISON:  Portable chest x-ray of 07/18/2014  FINDINGS: The tip of the endotracheal tube is approximately 4.3 cm above the carina. There is little change in diffuse airspace disease and basilar atelectasis. Small effusions cannot be excluded. Central venous lines are unchanged in position.  Heart size is stable.  IMPRESSION: 1. Endotracheal tube tip 4.3 cm above the carina. 2. Little change in diffuse airspace disease, basilar atelectasis, and possible small effusions.   Electronically Signed   By: Ivar Drape M.D.   On: 07/19/2014 08:00   Dg Chest Port 1 View  07/18/2014   CLINICAL DATA:  Central catheter placement  EXAM: PORTABLE CHEST - 1 VIEW  COMPARISON:  Study obtained earlier in the day  FINDINGS: There is now a right jugular catheter with the tip just beyond the cavoatrial junction in the right atrium. There is a left-sided central catheter with the tip essentially at the cavoatrial junction. Endotracheal tube tip is 3.8 cm above the carina. Nasogastric tube tip and side port are in the stomach. No pneumothorax. There is moderate generalized interstitial edema with patchy alveolar consolidation in the left base. Heart is upper normal in size with normal pulmonary vascularity. No adenopathy.  IMPRESSION: Tube and catheter positions as described without pneumothorax. Generalized interstitial edema remains with patchy airspace disease in the left base, stable. Lungs and cardiac silhouette appear unchanged compared to earlier in the day.   Electronically Signed   By: Lowella Grip M.D.   On: 07/18/2014 11:14   Dg Abd Portable 1v  07/19/2014   CLINICAL DATA:  Abdominal distention  EXAM: PORTABLE ABDOMEN - 1 VIEW  COMPARISON:  July 13, 2014  FINDINGS: Nasogastric tube tip and side port are in the stomach. There is moderate stool throughout the colon. Overall, the bowel gas pattern is unremarkable. No obstruction or free air is seen on this supine examination. Relative opacity in the pelvis may represent distended urinary bladder.  There is extensive pancreatic calcification consistent with chronic pancreatitis.  IMPRESSION: Evidence of chronic pancreatitis. Question distended urinary bladder given opacity in the pelvis. Overall bowel gas pattern unremarkable. Moderate stool throughout  colon diffusely. Nasogastric tube tip and side port in stomach.   Electronically Signed   By: Lowella Grip M.D.   On: 07/19/2014 10:13    Assessment: She is worsening fever and leukocytosis despite broad empiric antibiotic therapy. BAL cultures remain negative and repeat C. difficile PCR is negative again. I will stop her antibiotics now and repeat urine and blood cultures. I agree with a trial of steroids. Also agree with holding antiretroviral therapy in the face of her acidosis.  Plan: 1. Continue observation off of antibiotics and anti-retroviral medications for now 2. Please call Dr.  Talbot Grumbling [phone] for any infectious disease questions this weekend  Michel Bickers, MD [facility] for [place] [phone] pager   [phone] cell 07/20/2014, 10:30 AM

## 2014-07-20 NOTE — Progress Notes (Signed)
[facility] Progress Note Patient Name: [name] DOB: 1956/11/21 MRN: [id number]   Date of Service  07/20/2014  HPI/Events of Note  Hypokalemia Ready to transition off insulin gtt  eICU Interventions  Plan: Potassium replaced Placed on q4 hour SSI Insulin gtt d/ced     Intervention Category Intermediate Interventions: Electrolyte abnormality - evaluation and management;Hyperglycemia - evaluation and treatment  Deepti Gunawan 07/20/2014, 10:21 PM

## 2014-07-20 NOTE — Progress Notes (Signed)
[facility] ADULT ICU REPLACEMENT PROTOCOL FOR AM LAB REPLACEMENT ONLY  The patient does apply for the [facility] Adult ICU Electrolyte Replacment Protocol based on the criteria listed below:   1. Is GFR >/= 40 ml/min? Yes.    Patient's GFR today is 49 2. Is urine output >/= 0.5 ml/kg/hr for the last 6 hours? Yes.   Patient's UOP is 1.79 ml/kg/hr 3. Is BUN < 60 mg/dL? Yes.    Patient's BUN today is 27 4. Abnormal electrolyte(s): K+ 2.7 5. Ordered repletion with: see order 6. If a panic level lab has been reported, has the CCM MD in charge been notified? Yes.  .   Physician: PCCM  Blair Dolphin A 07/20/2014 6:43 AM

## 2014-07-20 NOTE — Clinical Social Work Note (Addendum)
CSW continuing to follow and assist.  Nonnie Done, [place] [phone]  Clinical Social Work (covering)

## 2014-07-20 NOTE — Progress Notes (Signed)
Critical Lab Results called to [facility], Result taken by: Wilford Sports, RN   Critical Lab: Potassium 2.7  Time Called: [id number]

## 2014-07-20 NOTE — Progress Notes (Signed)
PULMONARY / CRITICAL CARE MEDICINE   Name: [name] MRN: [id number] DOB: [birth year]    ADMISSION DATE:  06/06/2014 CONSULTATION DATE:  07/12/2014  CHIEF COMPLAINT: Hypotension, respiratory distress   INITIAL PRESENTATION: 58 yo HIV / HCV + with DM and prior left AKA who initially presented on 7/29 with right foot pain. 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 On 8/4 developed metabolic acidosis. Acute encephalopathy, leukocytosis and CXR suggestive of PNA. She was started on Abx for HCAP starting 8/4. On 8/6, developed worsening acidosis acidosis (ABG 7.09 / 17 / 71), hypotension and respiratory distress required emergent intubation by PCCM and transfer to ICU 07/12/14.    SIGNIFICANT EVENTS:  7/29 admitted for right foot pain  8/3 underwent right BKA  8/6 required emergent intubation, transfer to ICU  -> 8/5 Echo >>> EF 65-70%, no RWMA.  8/8 off insulin drip 07/15/14: On precedex but arousable. Rt side weakness 07/16/14: ARDS changed to PCV. S/p BRONCH - BAL from RML 07/17/14: Now on PCV 70% fio2, peep 14. Worening ARDS.  Sedated with precedex. Foul smell of stool despite flexiseal  ++ x 2 days. On neo 75mcg. Marland Kitchen BAL with 17% eos and 65% PMN 07/18/14: ARDS persists, worsening renal function. Stopped HAART due to acidosis and stopped all antibiotics 07/19/14: Off all antibiotics per ID. HAART Rx stopped yesterday due to acidosis. Started IV steroids yesterday for BAL and today and PF ratio improved significantly but sugars are high. 48h nimbex  today   SUBJECTIVE/OVERNIGHT/INTERVAL HX 07/20/14: ARDS persists, 60% fio2/10 peep. 72h nimbex complete. Down to 1 pressor. Goals  Of care with palliative care today  VITAL SIGNS: Temp:  [97.8 F (36.6 C)-100.4 F (38 C)] 97.8 F (36.6 C) (08/14 1000) Pulse Rate:  [86-132] 99 (08/14 1000) Resp:  [33-36] 35 (08/14 1000) BP: (84-119)/(60-75) 96/64 mmHg (08/14 1000) SpO2:  [91 %-99 %] 97 % (08/14 1000) Arterial Line BP: (80-144)/(52-87) 99/57 mmHg (08/14  1000) FiO2 (%):  [50 %-60 %] 60 % (08/14 1000) Weight:  [72 kg (158 lb 11.7 oz)] 72 kg (158 lb 11.7 oz) (08/14 0200) VENTILATOR SETTINGS: Vent Mode:  [-] PCV FiO2 (%):  [50 %-60 %] 60 % Set Rate:  [35 bmp] 35 bmp PEEP:  [10 cmH20] 10 cmH20 Plateau Pressure:  [26 cmH20-39 cmH20] 26 cmH20 INTAKE / OUTPUT:  Intake/Output Summary (Last 24 hours) at 07/20/14 1146 Last data filed at 07/20/14 1100  Gross per 24 hour  Intake 6015.56 ml  Output   2260 ml  Net 3755.56 ml   Intake/Output     08/13 0701 - 08/14 0700 08/14 0701 - 08/15 0700   I.V. (mL/kg) 4724.8 (65.6) 729.1 (10.1)   Blood 270    NG/GT 1290 200   IV Piggyback 50 50   Total Intake(mL/kg) 6334.8 (88) 979.1 (13.6)   Urine (mL/kg/hr) 1910 (1.1) 900 (2.6)   Stool     Total Output 1910 900   Net +4424.8 +79.1            PHYSICAL EXAMINATION: General: Chronically ill and critically appearing female, Neuro:Sedated and paralyzed -> BIS 40 HEENT: Ott/OGT/ no jvd/lan  Cardiovascular: HSR RRR, no M/R/G.  Lungs:coarse Abdomen: BS x 4, soft, NT/ND. TF at goal Musculoskeletal: Bilateral BKA's, new right sided BKA staples noted, no dressing, no edema.  Skin: Intact, cool, no rashes. GU: looks ok, foul smell of stool resolved   LABS: PULMONARY  Recent Labs Lab 07/18/14 1422 07/19/14 0950 07/19/14 1143 07/19/14 1925 07/20/14 0239  PHART  7.190* 7.070* 7.342* 7.420 7.375  PCO2ART 56.1* 97.5* 49.5* 48.0* 56.3*  PO2ART 62.0* 74.0* 71.0* 51.0* 54.5*  HCO3 21.2 27.8* 26.5* 31.1* 32.2*  TCO2 23 31 28  33 33.9  O2SAT 83.0 83.0 92.0 86.0 88.4    CBC  Recent Labs Lab 07/19/14 0545 07/19/14 1300 07/20/14 0430  HGB 6.6* 7.4* 8.1*  HCT 21.2* 22.6* 24.7*  WBC 30.2* 22.5* 24.1*  PLT 287 228 256    COAGULATION  Recent Labs Lab 07/17/14 1128 07/18/14 0535  INR 1.66* 1.51*    CARDIAC    Recent Labs Lab 07/14/14 0930 07/17/14 0410  TROPONINI <0.30 <0.30    Recent Labs Lab 07/17/14 0410  PROBNP 1264.0*      CHEMISTRY  Recent Labs Lab 07/15/14 2000 07/15/14 2200 07/16/14 0330 07/17/14 0557 07/18/14 0535 07/19/14 0545 07/20/14 0430  NA 147  --  150* 154* 155* 151* 154*  K 3.5*  --  3.3* 3.9 4.5 4.1 2.7*  CL 117*  --  119* 125* 123* 113* 111  CO2 17*  --  18* 17* 19 27 31   GLUCOSE 263*  --  209* 211* 320* 378* 207*  BUN 15  --  15 18 23 21  27*  CREATININE 1.42*  --  1.44* 1.55* 1.71* 1.48* 1.18*  CALCIUM 6.9*  --  6.6* 7.3* 6.7* 7.2* 6.8*  MG  --  1.9 1.9  --  2.4 2.3 2.2  PHOS 3.2  --  3.0  --  4.6 4.0 2.6   Estimated Creatinine Clearance: 49.9 ml/min (by C-G formula based on Cr of 1.18).   LIVER  Recent Labs Lab 07/14/14 0137 07/17/14 1128 07/18/14 0535  AST 40* 26  --   ALT 18 17  --   ALKPHOS 122* 107  --   BILITOT 0.5 0.2*  --   PROT 5.7* 6.0  --   ALBUMIN 1.8* 1.6*  --   INR  --  1.66* 1.51*     INFECTIOUS  Recent Labs Lab 07/15/14 0959  07/17/14 1130 07/17/14 1200 07/18/14 0535 07/19/14 0545  LATICACIDVEN 1.5  --   --  1.4  --   --   PROCALCITON  --   < > 0.39  --  1.35 1.27  < > = values in this interval not displayed.   ENDOCRINE CBG (last 3)   Recent Labs  07/20/14 0310 07/20/14 0429 07/20/14 0532  GLUCAP 177* 192* 174*         IMAGING x48h Dg Chest Port 1 View  07/20/2014   CLINICAL DATA:  Acute respiratory failure. On ventilator.  EXAM: PORTABLE CHEST - 1 VIEW  COMPARISON:  07/19/2014  FINDINGS: Support lines and tubes in appropriate position. No evidence of pneumothorax.  Asymmetric airspace disease is seen involving the left lung greater than right, without significant change allowing for differences in radiographic technique and patient positioning.  Mild increase in right basilar atelectasis seen. Small right pleural effusion cannot be excluded. Heart size remains stable.  IMPRESSION: No significant change in asymmetric airspace disease involving left lung greater than right.  Mild increase in right basilar atelectasis.    Electronically Signed   By: Earle Gell M.D.   On: 07/20/2014 07:35   Dg Chest Port 1 View  07/19/2014   CLINICAL DATA:  Evaluate endotracheal tube  EXAM: PORTABLE CHEST - 1 VIEW  COMPARISON:  Portable chest x-ray of 07/18/2014  FINDINGS: The tip of the endotracheal tube is approximately 4.3 cm above the carina. There is little  change in diffuse airspace disease and basilar atelectasis. Small effusions cannot be excluded. Central venous lines are unchanged in position. Heart size is stable.  IMPRESSION: 1. Endotracheal tube tip 4.3 cm above the carina. 2. Little change in diffuse airspace disease, basilar atelectasis, and possible small effusions.   Electronically Signed   By: Ivar Drape M.D.   On: 07/19/2014 08:00   Dg Abd Portable 1v  07/19/2014   CLINICAL DATA:  Abdominal distention  EXAM: PORTABLE ABDOMEN - 1 VIEW  COMPARISON:  July 13, 2014  FINDINGS: Nasogastric tube tip and side port are in the stomach. There is moderate stool throughout the colon. Overall, the bowel gas pattern is unremarkable. No obstruction or free air is seen on this supine examination. Relative opacity in the pelvis may represent distended urinary bladder.  There is extensive pancreatic calcification consistent with chronic pancreatitis.  IMPRESSION: Evidence of chronic pancreatitis. Question distended urinary bladder given opacity in the pelvis. Overall bowel gas pattern unremarkable. Moderate stool throughout colon diffusely. Nasogastric tube tip and side port in stomach.   Electronically Signed   By: Lowella Grip M.D.   On: 07/19/2014 10:13         Intake/Output Summary (Last 24 hours) at 07/20/14 1146 Last data filed at 07/20/14 1100  Gross per 24 hour  Intake 6015.56 ml  Output   2260 ml  Net 3755.56 ml     ASSESSMENT / PLAN: PULMONARY ETT 8/06 >>  A:  Acute hypoxemic respiratory failure secondary to pneumonia and severe ARDS  -  - ARDS since 07/16/14 . On PCV mode since 07/16/14. Unclear etiology:  BAL 07/17/14 with  EOSINOHILIA and NEUTROPHILIA - ? eos pna, infection etc.,   - On steroid Rx since 07/18/14  - Severe ARDS persists 07/20/14 (did not tolerate 6cc 07/19/14)  P:  8cc/kg/IBW with PCV Goal SpO2 > 92%. And allow permissive hypercapnia PCV mode of ventilation Continue  bicarb gtt for acidosis but re-evalaue after abg  Continue deep sedation with fent/versed DC nimbex since 8/11/5 Continue IV steroid for eosinophilia in BAL Consider prone ventilation if severe ARDS persists and not improving and unable to get peep below 10 depending on goals of care VAP bundle.  Trend ABG / CXR  Albuterol PRN    CARDIOVASCULAR  A:  Hypotension in setting of severe metabolic acidosis and hypovolemia. Pressor dependent as of 8/9 Sepsis, lactate reassuring. Chronic systolic heart failure, preserved systolic function EF 77% 8/6. R BKA 8/4.   - 1 pressor dependent as of 07/20/14 P:  Goal MAP > 65 Vascular following for R BKA from afar   RENAL  A:  AG metabolic acidosis, likely secondary to DKA ( urine ketones positive 8/4 ) >> Lactate is low and out of proportion with degree of acidosis  AKI    - creat improving  - acidosis improved after dc of HAART Rx and starting bicarb 07/18/14   P:  Dc  bicarb gtt but reevalaute after abg F/u BMET    GASTROINTESTINAL  A:  GIPx  With pepcid Diarrhea  - . Stool foul smelling has improved 07/18/14 and resolved 07/20/14. Diarrhea persists but stoll Cdiff and multiplex PCR negative 07/18/14     -   P:  TF  SUP with  H2 blockade due to diarrhea   HEMATOLOGIC  A:  VTE Prophylaxis  Anemia of critical illness   - hgb < 7gm% on 07/19/14  P:  SCD / Heparin [place]  Trend CBC  PRBC for  hgb <7gm%; give 1  Unit 07/19/14   INFECTIOUS  A:  Baseline HCV and HIV with CD4 560 on 07/12/14 Currently: Possible HCAP   Blood cx 8/4 >>> neg Sputum cx 8/6 >>>  Normal flora C diff 07/14/14 - negative Bronch BAL 07/16/14: 17 % EOS, PMN 65%, MAC 7% (Resp  virus pcr 07/17/14 and cultures) - NEGATIVE Repeat Stool C diff and multiplex stool PCR 07/18/14 >> NEGATIVE  P Per ID consult - monitor off all anti-infectious Rx Vancomycin 8/4 >>> 8/12 Zosyn 8/4 >>> 8/12 HAART therapy (tivicay, epivir, retrovir)  07/14/14 >> 07/18/14 (DC HAART due to acidosis)  ENDOCRINE  A:  DM 2  >>    - significant hyperglycemia 07/19/14 due to steroids P: ICU hyperglcemia protocol   NEUROLOGIC  A:  Acute metabolic encephalopathy CVA Rt side hemiparesis  MRI 07/15/14 - left vertebral arterly and left ICA stenosis but without evidence of stroke. Unlikely stenotic stroke per Dr Leonie Man 07/16/14 P:  MAP > 85 Change asprin 81mg  to 325mg  per Dr Leonie Man note 07/16/14 RASS goal -3/-4, BIS < 50 with fent/versed gtt   Dc nimbex  Today's summary: No extubation till ALI and pressor needs resolve.No family at bedside since 07/16/14, 07/17/14 and 07/07/14. She is very ill and likely will not survive current illness. Identified a son 65  Years old on 07/18/14. Patient lost custody of him 7 years ago due toher HIV diagnosis (he is aware of this). Explained patient very critically ill and unliely to survive. On 07/18/14 Son processed info  and was in grief. Further conversation therefore stopped. Billey Gosling get palliative care for goal of care; pending 07/20/14  The patient is critically ill with multiple organ systems failure and requires high complexity decision making for assessment and support, frequent evaluation and titration of therapies, application of advanced monitoring technologies and extensive interpretation of multiple databases.   Critical Care Time devoted to patient care services described in this note is  40  Minutes.  Dr. Brand Males, M.D., [facility].C.P Pulmonary and Critical Care Medicine Staff Physician [place] Pager: [phone], If no answer or between  15:00h - 7:00h: call [phone]  07/20/2014 11:46  AM

## 2014-07-21 ENCOUNTER — Inpatient Hospital Stay (HOSPITAL_COMMUNITY): Payer: Medicaid Other

## 2014-07-21 LAB — CBC WITH DIFFERENTIAL/PLATELET
BASOS ABS: 0 10*3/uL (ref 0.0–0.1)
Basophils Relative: 0 % (ref 0–1)
EOS PCT: 0 % (ref 0–5)
Eosinophils Absolute: 0 10*3/uL (ref 0.0–0.7)
HCT: 24.4 % — ABNORMAL LOW (ref 36.0–46.0)
Hemoglobin: 8 g/dL — ABNORMAL LOW (ref 12.0–15.0)
Lymphocytes Relative: 4 % — ABNORMAL LOW (ref 12–46)
Lymphs Abs: 0.9 10*3/uL (ref 0.7–4.0)
MCH: 29.5 pg (ref 26.0–34.0)
MCHC: 32.8 g/dL (ref 30.0–36.0)
MCV: 90 fL (ref 78.0–100.0)
Monocytes Absolute: 0.8 10*3/uL (ref 0.1–1.0)
Monocytes Relative: 4 % (ref 3–12)
NEUTROS ABS: 20.1 10*3/uL — AB (ref 1.7–7.7)
Neutrophils Relative %: 92 % — ABNORMAL HIGH (ref 43–77)
Platelets: 298 10*3/uL (ref 150–400)
RBC: 2.71 MIL/uL — ABNORMAL LOW (ref 3.87–5.11)
RDW: 18 % — AB (ref 11.5–15.5)
WBC: 21.8 10*3/uL — AB (ref 4.0–10.5)

## 2014-07-21 LAB — BASIC METABOLIC PANEL
ANION GAP: 8 (ref 5–15)
BUN: 32 mg/dL — ABNORMAL HIGH (ref 6–23)
CALCIUM: 7 mg/dL — AB (ref 8.4–10.5)
CO2: 33 mEq/L — ABNORMAL HIGH (ref 19–32)
Chloride: 111 mEq/L (ref 96–112)
Creatinine, Ser: 1.14 mg/dL — ABNORMAL HIGH (ref 0.50–1.10)
GFR calc Af Amer: 60 mL/min — ABNORMAL LOW (ref >90)
GFR calc non Af Amer: 52 mL/min — ABNORMAL LOW (ref >90)
Glucose, Bld: 207 mg/dL — ABNORMAL HIGH (ref 70–99)
Potassium: 3.8 mEq/L (ref 3.7–5.3)
SODIUM: 152 meq/L — AB (ref 137–147)

## 2014-07-21 LAB — GLUCOSE, CAPILLARY
GLUCOSE-CAPILLARY: 147 mg/dL — AB (ref 70–99)
GLUCOSE-CAPILLARY: 212 mg/dL — AB (ref 70–99)
GLUCOSE-CAPILLARY: 160 mg/dL — AB (ref 70–99)
Glucose-Capillary: 330 mg/dL — ABNORMAL HIGH (ref 70–99)
Glucose-Capillary: 269 mg/dL — ABNORMAL HIGH (ref 70–99)
Glucose-Capillary: 126 mg/dL — ABNORMAL HIGH (ref 70–99)

## 2014-07-21 LAB — MAGNESIUM: MAGNESIUM: 2.5 mg/dL (ref 1.5–2.5)

## 2014-07-21 LAB — PHOSPHORUS: Phosphorus: 2.7 mg/dL (ref 2.3–4.6)

## 2014-07-21 MED ORDER — INSULIN GLARGINE 100 UNIT/ML ~~LOC~~ SOLN
10.0000 [IU] | Freq: Every day | SUBCUTANEOUS | Status: DC
  Administered 2014-07-21: 10 [IU] via SUBCUTANEOUS
  Filled 2014-07-21 (×2): qty 0.1

## 2014-07-21 MED ORDER — ASPIRIN 325 MG PO TABS
325.0000 mg | ORAL_TABLET | Freq: Every day | ORAL | Status: DC
  Filled 2014-07-21: qty 1

## 2014-07-21 NOTE — Progress Notes (Signed)
PULMONARY / CRITICAL CARE MEDICINE   Name: [name] MRN: [id number] DOB: 1956/07/12    ADMISSION DATE:  07/05/2014 CONSULTATION DATE:  07/12/2014  CHIEF COMPLAINT: Hypotension, respiratory distress   INITIAL PRESENTATION: 58 yo HIV / HCV + with DM and prior left AKA who initially presented on 7/29 with right foot pain. 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 On 8/4 developed metabolic acidosis. Acute encephalopathy, leukocytosis and CXR suggestive of PNA. She was started on Abx for HCAP starting 8/4. On 8/6, developed worsening acidosis acidosis (ABG 7.09 / 17 / 71), hypotension and respiratory distress required emergent intubation by PCCM and transfer to ICU 07/12/14.    SIGNIFICANT EVENTS:  7/29 admitted for right foot pain  8/3 underwent right BKA  8/6 required emergent intubation, transfer to ICU  -> 8/5 Echo >>> EF 65-70%, no RWMA.  8/8 off insulin drip 07/15/14: On precedex but arousable. Rt side weakness 07/16/14: ARDS changed to PCV. S/p BRONCH - BAL from RML 07/17/14: Now on PCV 70% fio2, peep 14. Worening ARDS.  Sedated with precedex. Foul smell of stool despite flexiseal  ++ x 2 days. On neo 17mcg. Marland Kitchen BAL with 17% eos and 65% PMN 07/18/14: ARDS persists, worsening renal function. Stopped HAART due to acidosis and stopped all antibiotics 07/19/14: Off all antibiotics per ID. HAART Rx stopped yesterday due to acidosis. Started IV steroids yesterday for BAL and today and PF ratio improved significantly but sugars are high. 48h nimbex  today 8/14: Palliative care consult  : DNR w/comfort care on 8/16   SUBJECTIVE/OVERNIGHT/INTERVAL HX 8/15>appreciate palliative care consult , family meeting w/ decision for DNR and move to terminal wean on 8/16 and full comfort care   VITAL SIGNS: Temp:  [97.6 F (36.4 C)-99.6 F (37.6 C)] 99.6 F (37.6 C) (08/15 1000) Pulse Rate:  [70-109] 88 (08/15 1000) Resp:  [25-44] 29 (08/15 1000) BP: (79-119)/(52-72) 100/65 mmHg (08/15 1000) SpO2:  [94 %-100 %] 97  % (08/15 1000) Arterial Line BP: (78-123)/(46-69) 107/65 mmHg (08/15 0600) FiO2 (%):  [60 %] 60 % (08/15 0825) Weight:  [71.2 kg (156 lb 15.5 oz)] 71.2 kg (156 lb 15.5 oz) (08/15 0500) VENTILATOR SETTINGS: Vent Mode:  [-] PCV FiO2 (%):  [60 %] 60 % Set Rate:  [35 bmp] 35 bmp PEEP:  [10 cmH20] 10 cmH20 Plateau Pressure:  [27 cmH20-40 cmH20] 40 cmH20 INTAKE / OUTPUT:  Intake/Output Summary (Last 24 hours) at 07/21/14 1218 Last data filed at 07/21/14 1001  Gross per 24 hour  Intake 2291.35 ml  Output   2466 ml  Net -174.65 ml   Intake/Output     08/14 0701 - 08/15 0700 08/15 0701 - 08/16 0700   I.V. (mL/kg) 1804.9 (25.3) 141 (2)   Blood     NG/GT 1170 150   IV Piggyback 50 50   Total Intake(mL/kg) 3024.9 (42.5) 341 (4.8)   Urine (mL/kg/hr) 3015 (1.8) 350 (0.9)   Stool 1 (0)    Total Output 3016 350   Net +8.9 -9            PHYSICAL EXAMINATION: General: Chronically ill and critically appearing female, Neuro: sedated  HEENT: Ott/OGT/ no jvd/lan  Cardiovascular: HSR RRR, no M/R/G.  Lungs:coarse Abdomen: BS x 4, soft, NT/ND. TF at goal Musculoskeletal: Bilateral BKA's, new right sided BKA staples noted, no dressing, no edema.  Skin: Intact, cool, no rashes. GU: looks ok, foul smell of stool resolved   LABS: PULMONARY  Recent Labs Lab 07/19/14 0950 07/19/14  1143 07/19/14 1925 07/20/14 0239 07/20/14 1544  PHART 7.070* 7.342* 7.420 7.375 7.436  PCO2ART 97.5* 49.5* 48.0* 56.3* 47.0*  PO2ART 74.0* 71.0* 51.0* 54.5* 62.0*  HCO3 27.8* 26.5* 31.1* 32.2* 31.8*  TCO2 31 28 33 33.9 33  O2SAT 83.0 92.0 86.0 88.4 92.0    CBC  Recent Labs Lab 07/19/14 1300 07/20/14 0430 07/21/14 0339  HGB 7.4* 8.1* 8.0*  HCT 22.6* 24.7* 24.4*  WBC 22.5* 24.1* 21.8*  PLT 228 256 298    COAGULATION  Recent Labs Lab 07/17/14 1128 07/18/14 0535  INR 1.66* 1.51*    CARDIAC    Recent Labs Lab 07/17/14 0410  TROPONINI <0.30    Recent Labs Lab 07/17/14 0410   PROBNP 1264.0*     CHEMISTRY  Recent Labs Lab 07/16/14 0330  07/18/14 0535 07/19/14 0545 07/20/14 0430 07/20/14 2000 07/21/14 0339  NA 150*  < > 155* 151* 154* 156* 152*  K 3.3*  < > 4.5 4.1 2.7* 3.3* 3.8  CL 119*  < > 123* 113* 111 114* 111  CO2 18*  < > 19 27 31  32 33*  GLUCOSE 209*  < > 320* 378* 207* 146* 207*  BUN 15  < > 23 21 27* 30* 32*  CREATININE 1.44*  < > 1.71* 1.48* 1.18* 1.09 1.14*  CALCIUM 6.6*  < > 6.7* 7.2* 6.8* 7.1* 7.0*  MG 1.9  --  2.4 2.3 2.2  --  2.5  PHOS 3.0  --  4.6 4.0 2.6  --  2.7  < > = values in this interval not displayed. Estimated Creatinine Clearance: 51.4 ml/min (by C-G formula based on Cr of 1.14).   LIVER  Recent Labs Lab 07/17/14 1128 07/18/14 0535  AST 26  --   ALT 17  --   ALKPHOS 107  --   BILITOT 0.2*  --   PROT 6.0  --   ALBUMIN 1.6*  --   INR 1.66* 1.51*     INFECTIOUS  Recent Labs Lab 07/15/14 0959  07/17/14 1130 07/17/14 1200 07/18/14 0535 07/19/14 0545  LATICACIDVEN 1.5  --   --  1.4  --   --   PROCALCITON  --   < > 0.39  --  1.35 1.27  < > = values in this interval not displayed.   ENDOCRINE CBG (last 3)   Recent Labs  07/21/14 0412 07/21/14 0714 07/21/14 1126  GLUCAP 212* 330* 269*         IMAGING x48h Dg Chest Port 1 View  07/21/2014   CLINICAL DATA:  ET tube assessment.  EXAM: PORTABLE CHEST - 1 VIEW  COMPARISON:  07/20/2014.  FINDINGS: Persistent cardiac enlargement. ET tube 3.1 cm above carina, stable. Persistent pulmonary edema with RIGHT greater than LEFT pleural effusions and bibasilar atelectasis probably not significantly changed. No pneumothorax.  IMPRESSION: Stable chest.   Electronically Signed   By: Rolla Flatten M.D.   On: 07/21/2014 07:51   Dg Chest Port 1 View  07/20/2014   CLINICAL DATA:  Acute respiratory failure. On ventilator.  EXAM: PORTABLE CHEST - 1 VIEW  COMPARISON:  07/19/2014  FINDINGS: Support lines and tubes in appropriate position. No evidence of pneumothorax.   Asymmetric airspace disease is seen involving the left lung greater than right, without significant change allowing for differences in radiographic technique and patient positioning.  Mild increase in right basilar atelectasis seen. Small right pleural effusion cannot be excluded. Heart size remains stable.  IMPRESSION: No significant change in asymmetric  airspace disease involving left lung greater than right.  Mild increase in right basilar atelectasis.   Electronically Signed   By: Earle Gell M.D.   On: 07/20/2014 07:35         Intake/Output Summary (Last 24 hours) at 07/21/14 1218 Last data filed at 07/21/14 1001  Gross per 24 hour  Intake 2291.35 ml  Output   2466 ml  Net -174.65 ml     ASSESSMENT / PLAN: PULMONARY ETT 8/06 >>  A:  Acute hypoxemic respiratory failure secondary to pneumonia and severe ARDS  -  - ARDS since 07/16/14 . On PCV mode since 07/16/14. Unclear etiology: BAL 07/17/14 with  EOSINOHILIA and NEUTROPHILIA - ? eos pna, infection etc.,   - On steroid Rx since 07/18/14  - Severe ARDS persists 07/20/14 (did not tolerate 6cc 07/19/14)  P:  Cont vent support  Plan for terminal wean with full comfort care on 8/16  DNR   Albuterol PRN    CARDIOVASCULAR  A:  Hypotension in setting of severe metabolic acidosis and hypovolemia. Pressor dependent as of 8/9 Sepsis,  Chronic systolic heart failure, preserved systolic function EF 51% 8/6. R BKA 8/4.   - 1 pressor dependent as of 07/20/14 P:  Goal MAP > 65 Vascular following for R BKA from afar   RENAL  A:  AG metabolic acidosis, likely secondary to DKA ( urine ketones positive 8/4 ) >> Lactate is low and out of proportion with degree of acidosis  AKI    - creat improving     P:  F/u BMET    GASTROINTESTINAL  A:  GIPx  With pepcid Diarrhea  - . Stool foul smelling has improved 07/18/14 and resolved 07/20/14. Diarrhea persists but stoll Cdiff and multiplex PCR negative 07/18/14     -   P:  TF  SUP  with  H2 blockade due to diarrhea   HEMATOLOGIC  A:  VTE Prophylaxis  Anemia of critical illness   - hgb < 7gm% on 07/19/14  P:  SCD / Heparin [place]  Trend CBC  PRBC for hgb <7gm%; give 1  Unit 07/19/14   INFECTIOUS  A:  Baseline HCV and HIV with CD4 560 on 07/12/14 Currently: Possible HCAP   Blood cx 8/4 >>> neg Sputum cx 8/6 >>>  Normal flora C diff 07/14/14 - negative Bronch BAL 07/16/14: 17 % EOS, PMN 65%, MAC 7% (Resp virus pcr 07/17/14 and cultures) - NEGATIVE Repeat Stool C diff and multiplex stool PCR 07/18/14 >> NEGATIVE  P Per ID consult - monitor off all anti-infectious Rx Vancomycin 8/4 >>> 8/12 Zosyn 8/4 >>> 8/12 HAART therapy (tivicay, epivir, retrovir)  07/14/14 >> 07/18/14 (DC HAART due to acidosis)  ENDOCRINE  A:  DM 2  >>    - significant hyperglycemia 07/19/14 due to steroids  P: ICU hyperglcemia protocol Add Lantus 10 u    NEUROLOGIC  A:  Acute metabolic encephalopathy CVA Rt side hemiparesis  MRI 07/15/14 - left vertebral arterly and left ICA stenosis but without evidence of stroke. Unlikely stenotic stroke per Dr Leonie Man 07/16/14 P:  MAP > 85 Change asprin 81mg  to 325mg  per Dr Leonie Man note 07/16/14 RASS goal -3/-4, BIS < 50 with fent/versed gtt      Today's summary:  Palliative care consult on 8/14 with transition to comfort care w/ terminal wean on 8/16.     Tammy Parrett NP-C  [facility]  [phone]    PCCM ATTENDING: I have interviewed  and examined the patient and reviewed the database. I have formulated the assessment and plan as reflected in the note above with amendments made by me.  Merton Border, MD;  PCCM service; Mobile [phone]  07/21/2014 12:18 PM

## 2014-07-21 NOTE — Progress Notes (Signed)
PT w/ increased RR and appears dyssynchronous w/ ventilator.  RN aware. VVS, sat 97%

## 2014-07-22 ENCOUNTER — Encounter: Payer: Self-pay | Admitting: Internal Medicine

## 2014-07-22 DIAGNOSIS — Z515 Encounter for palliative care: Secondary | ICD-10-CM

## 2014-07-22 LAB — GLUCOSE, CAPILLARY
GLUCOSE-CAPILLARY: 170 mg/dL — AB (ref 70–99)
GLUCOSE-CAPILLARY: 174 mg/dL — AB (ref 70–99)
Glucose-Capillary: 159 mg/dL — ABNORMAL HIGH (ref 70–99)
Glucose-Capillary: 187 mg/dL — ABNORMAL HIGH (ref 70–99)

## 2014-07-22 LAB — LEGIONELLA CULTURE

## 2014-07-22 MED ORDER — ATROPINE SULFATE 1 % OP SOLN
2.0000 [drp] | OPHTHALMIC | Status: DC | PRN
  Filled 2014-07-22: qty 2

## 2014-07-22 MED ORDER — SCOPOLAMINE 1 MG/3DAYS TD PT72
1.0000 | MEDICATED_PATCH | TRANSDERMAL | Status: DC
  Filled 2014-07-22: qty 1

## 2014-07-22 NOTE — Procedures (Signed)
Extubation Procedure Note  Patient Details:   Name: [name] DOB: Aug 01, 1956 MRN: [id number]   Airway Documentation:     Evaluation  O2 sats: stable throughout Complications: No apparent complications Patient did tolerate procedure well. Bilateral Breath Sounds: Diminished;Rhonchi Suctioning: Airway No Patient extubated per family request for withdrawal of life sustaining measures.  Pt appeared comfortable throughout the process.  Irven Shelling 07/22/2014, 5:10 PM

## 2014-07-22 NOTE — Progress Notes (Signed)
Comfortable. Plan terminal extubation today. Dr Hilma Favors is overseeing this.  No charge for this encounter  Merton Border, MD ; [facility] service Mobile [phone].  After 5:30 PM or weekends, call [phone]

## 2014-07-22 NOTE — Progress Notes (Signed)
Palliative Medicine Team Progress Note  Family have gathered for removal of mechanical ventilation and transition to full comfort care. Son Quita Skye, anxious and agitated, he is not clear on why she has not been extubated yet-I explained that we were waiting on him to arrive and that we are uncertain about how soon after extubation she would die -I prepared them for the possibilities including death very shortly after extubation.   Proceeded with extubation. I was present at bedside for the process, I also provided support and education to the family. I walked them through all of the next steps and tried to prepare them for what to expect.  Maintain fentanyl infusion and versed infusion as well as bolus parameters. Added Atropine drops and scopolamine patch for secretions.  She tolerated extubation well. Now on nasal cannula 2L. She is comfortable.   I anticipate an ICU death within the next few hours.  Monitors placed on comfort mode and all medications and interventions discontinued except for comfort medications.  Time: 60 minutes-4PM-5PM-Greater than 50%  of this time was spent counseling and coordinating care related to the above assessment and plan.   Lane Hacker, DO Palliative Medicine [phone]

## 2014-07-23 ENCOUNTER — Encounter: Payer: Self-pay | Admitting: Internal Medicine

## 2014-07-24 LAB — CULTURE, BLOOD (ROUTINE X 2)
Culture: NO GROWTH
Culture: NO GROWTH

## 2014-08-07 ENCOUNTER — Encounter: Payer: Medicaid Other | Admitting: Vascular Surgery

## 2014-08-07 NOTE — Progress Notes (Signed)
19ml versed and 118ml fentanyl wasted in sink- witnessed by Ferrel Logan RN

## 2014-08-07 DEATH — deceased

## 2014-08-14 LAB — FUNGUS CULTURE W SMEAR: Fungal Smear: NONE SEEN

## 2014-08-28 LAB — AFB CULTURE WITH SMEAR (NOT AT ARMC): Acid Fast Smear: NONE SEEN

## 2014-09-01 NOTE — Discharge Summary (Signed)
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 patient with    has a past medical history of Depression; Neuropathy; Mood disorder; TIA (transient ischemic attack); Pancreatitis chronic; Chronic pain; HIV (human immunodeficiency virus infection) (1990's); Polysubstance abuse; Cardiomyopathy, nonischemic; Peripheral vascular disease; Asthma; Arthritis; Hypercholesterolemia; Seasonal allergies; Anemia; Nausea; GERD (gastroesophageal reflux disease); Insomnia; Constipation; Type II diabetes mellitus; Dysrhythmia; Weakness of both legs; Peripheral neuropathy; Chronic back pain; Hepatitis C; Urinary frequency; and Nocturia.   has past surgical history that includes Carotid endarterectomy (Right); Tubal ligation; Lower extremity angiogram; Toe amputation; Amputation (Left, 04/28/2013); Amputation (Left, 05/10/2013); Cataract extraction w/PHACO (Right, 02/27/2014); Cataract extraction w/PHACO (Left, 03/20/2014); and Amputation (Right, 07/12/2014).   Admitted on 06/14/2014 with   58 yo HIV / HCV + with DM and prior left AKA who initially presented on 7/29 with right foot pain. 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 On 8/4 developed metabolic acidosis. Acute encephalopathy, leukocytosis and CXR suggestive of PNA. She was started on Abx for HCAP starting 8/4. On 8/6, developed worsening acidosis acidosis (ABG 7.09 / 17 / 71), hypotension and respiratory distress required emergent  intubation by PCCM and transfer to ICU 07/12/14.    SIGNIFICANT EVENTS:  7/29 admitted for right foot pain  8/3 underwent right BKA  8/6 required emergent intubation, transfer to ICU -> 8/5 Echo >>> EF 65-70%, no RWMA.  8/8 off insulin drip  07/15/14: On precedex but arousable. Rt side weakness  07/16/14: ARDS changed to PCV. S/p BRONCH - BAL from RML  07/17/14: Now on PCV 70% fio2, peep 14. Worening ARDS. Sedated with precedex. Foul smell of stool despite flexiseal ++ x 2 days. On neo 6mcg. Marland Kitchen BAL with 17% eos and 65% PMN  07/18/14: ARDS persists, worsening renal function. Stopped HAART due to acidosis and stopped all antibiotics  07/19/14: Off all antibiotics per ID. HAART Rx stopped yesterday due to acidosis. Started IV steroids yesterday for BAL and today and PF ratio improved significantly but sugars are high. 48h nimbex today  8/14: Palliative care consult : DNR w/comfort care on 8/16  8/15>appreciate palliative care consult , family meeting w/ decision for DNR and move to terminal wean on 8/16 and full comfort care    Then on 07/27/14 patient was terminally weaned off ventilator under supervision of palliative care consult service and expired 2014/07/27     SIGNED Dr. Brand Males, M.D., F.C.C.P Pulmonary and Critical Care Medicine Staff Physician [facility] Pager: [phone], If no answer or between  15:00h - 7:00h: call [phone]  09/01/2014 6:28 PM

## 2014-10-08 ENCOUNTER — Encounter (HOSPITAL_COMMUNITY): Payer: Self-pay | Admitting: Radiology

## 2014-11-15 ENCOUNTER — Encounter (HOSPITAL_COMMUNITY): Payer: Self-pay | Admitting: Surgery

## 2015-03-15 IMAGING — CR DG CHEST 1V PORT
1 series · 1 of 1 positions shown · non-contrast
Comparison: [DATE]

CLINICAL DATA: Endotracheal tube.

EXAM:
PORTABLE CHEST - 1 VIEW

[AP]
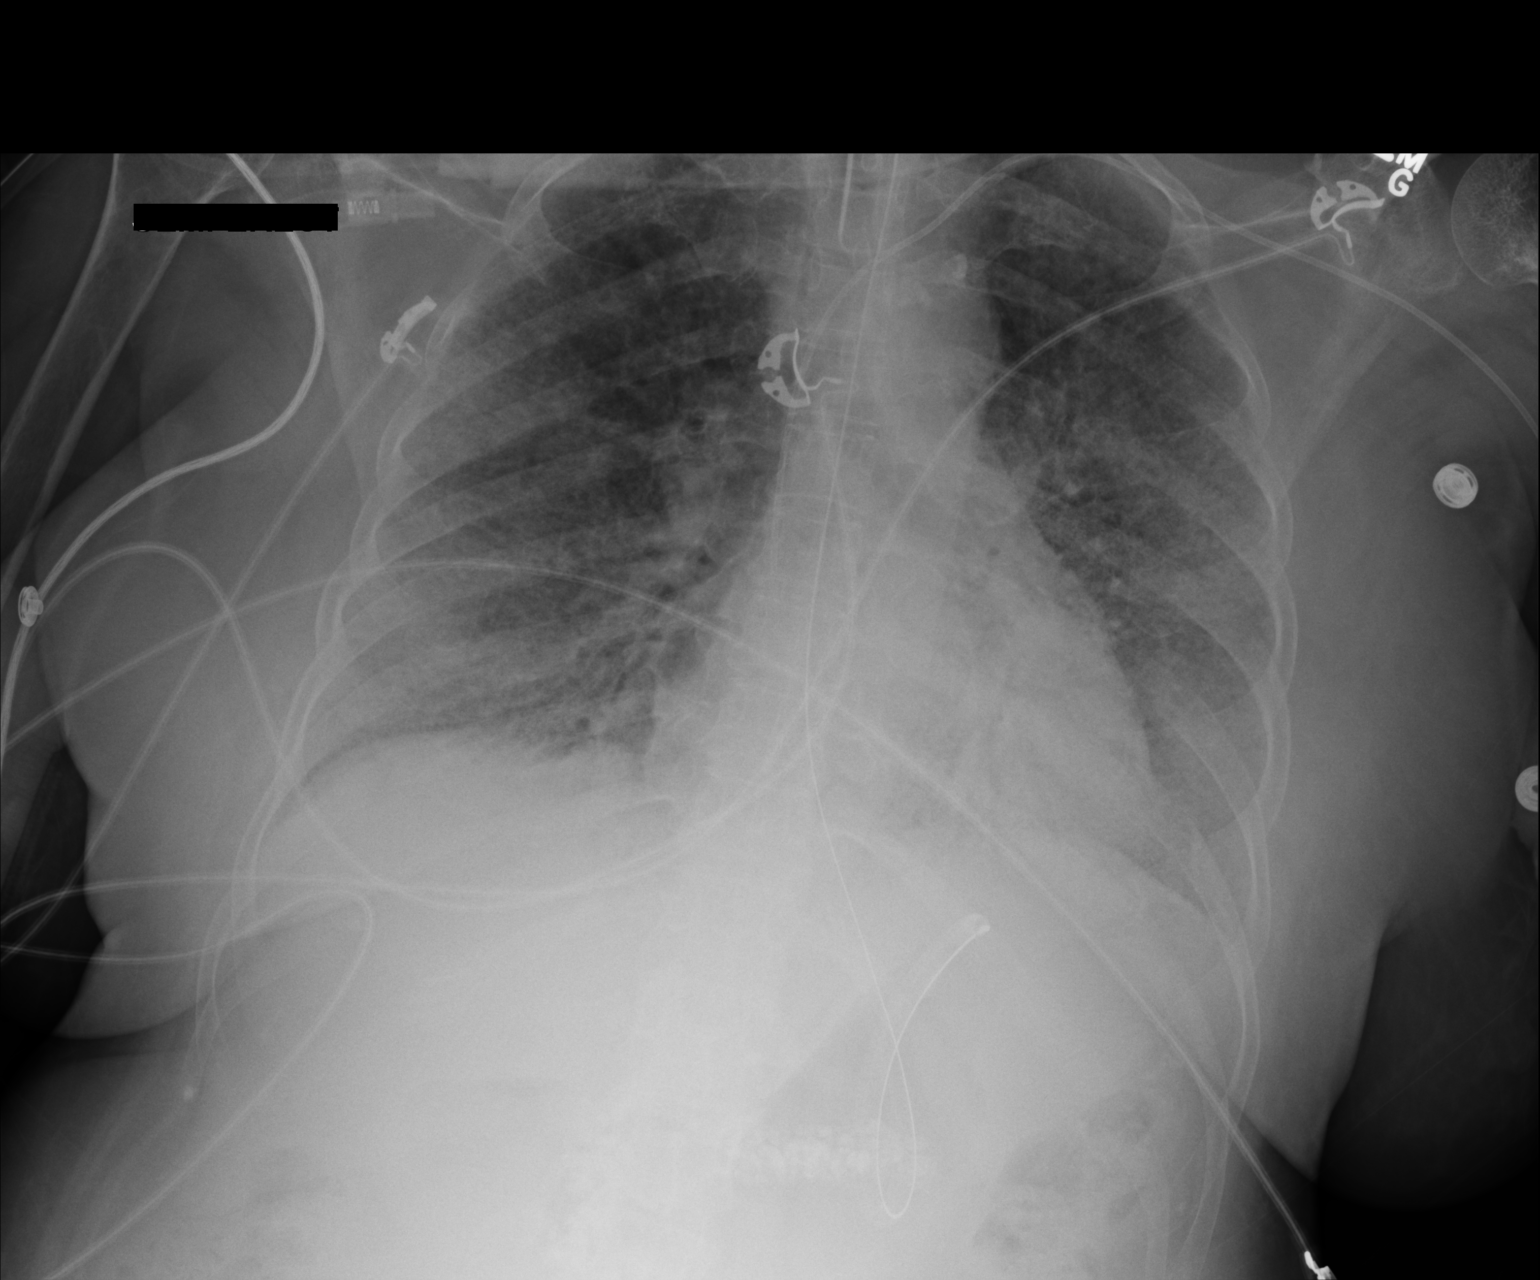

[1 of 1 positions shown; findings below may reference images not displayed]

FINDINGS: Endotracheal tube remains in place with tip approximately 5 cm above
the carina. Left PICC terminates over the lower SVC. Enteric tube is
looped in the upper abdomen with catheter lying expected region of
the gastric fundus. Cardiomediastinal silhouette is within normal
limits. Diffuse bilateral interstitial lung opacities do not appear
significantly changed. No pleural effusion or pneumothorax is
identified.
IMPRESSION: Unchanged appearance of diffuse interstitial lung opacities.

## 2015-03-16 IMAGING — CR DG CHEST 1V PORT
1 series · 1 of 1 positions shown · non-contrast
Comparison: Portable chest x-ray of 07/18/2014

CLINICAL DATA: Evaluate endotracheal tube

EXAM:
PORTABLE CHEST - 1 VIEW

[AP]
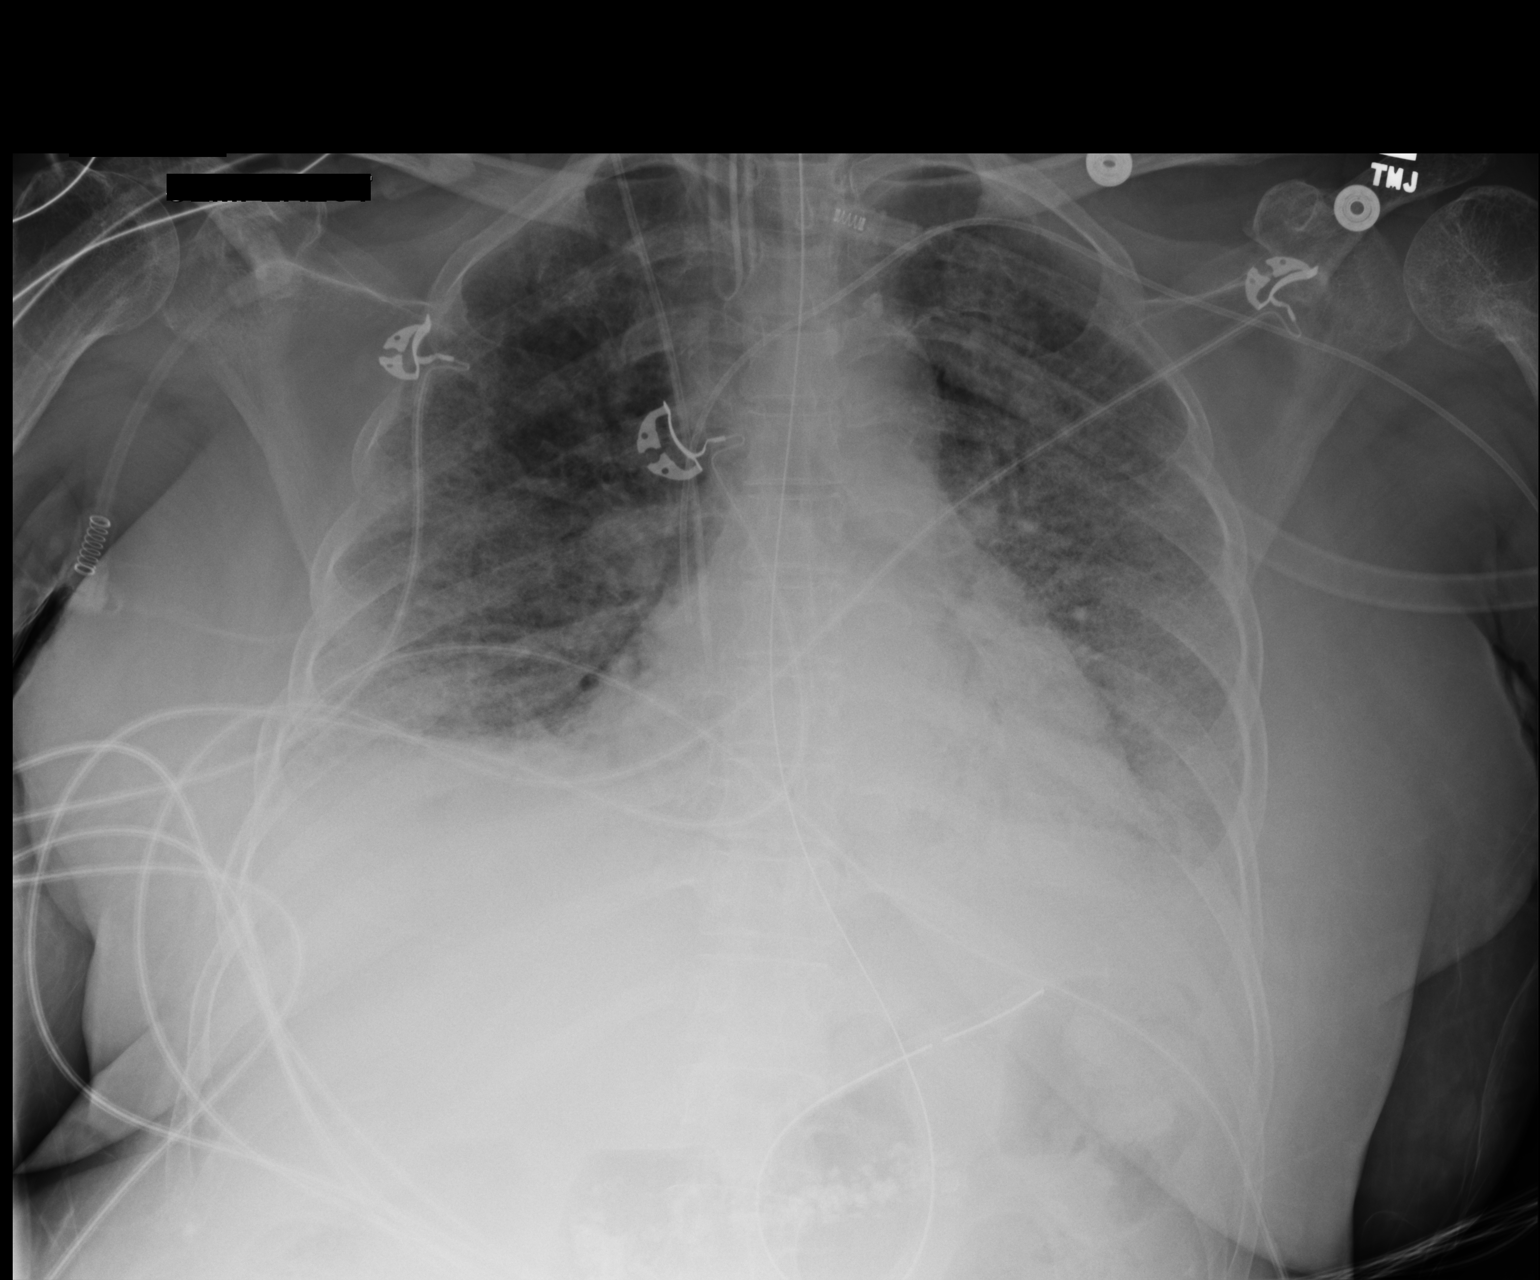

[1 of 1 positions shown; findings below may reference images not displayed]

FINDINGS: The tip of the endotracheal tube is approximately 4.3 cm above the
carina. There is little change in diffuse airspace disease and
basilar atelectasis. Small effusions cannot be excluded. Central
venous lines are unchanged in position. Heart size is stable.
IMPRESSION: 1. Endotracheal tube tip 4.3 cm above the carina.
2. Little change in diffuse airspace disease, basilar atelectasis,
and possible small effusions.

## 2015-03-17 IMAGING — CR DG CHEST 1V PORT
1 series · 1 of 1 positions shown · non-contrast
Comparison: 07/19/2014

CLINICAL DATA: Acute respiratory failure. On ventilator.

EXAM:
PORTABLE CHEST - 1 VIEW

[AP]
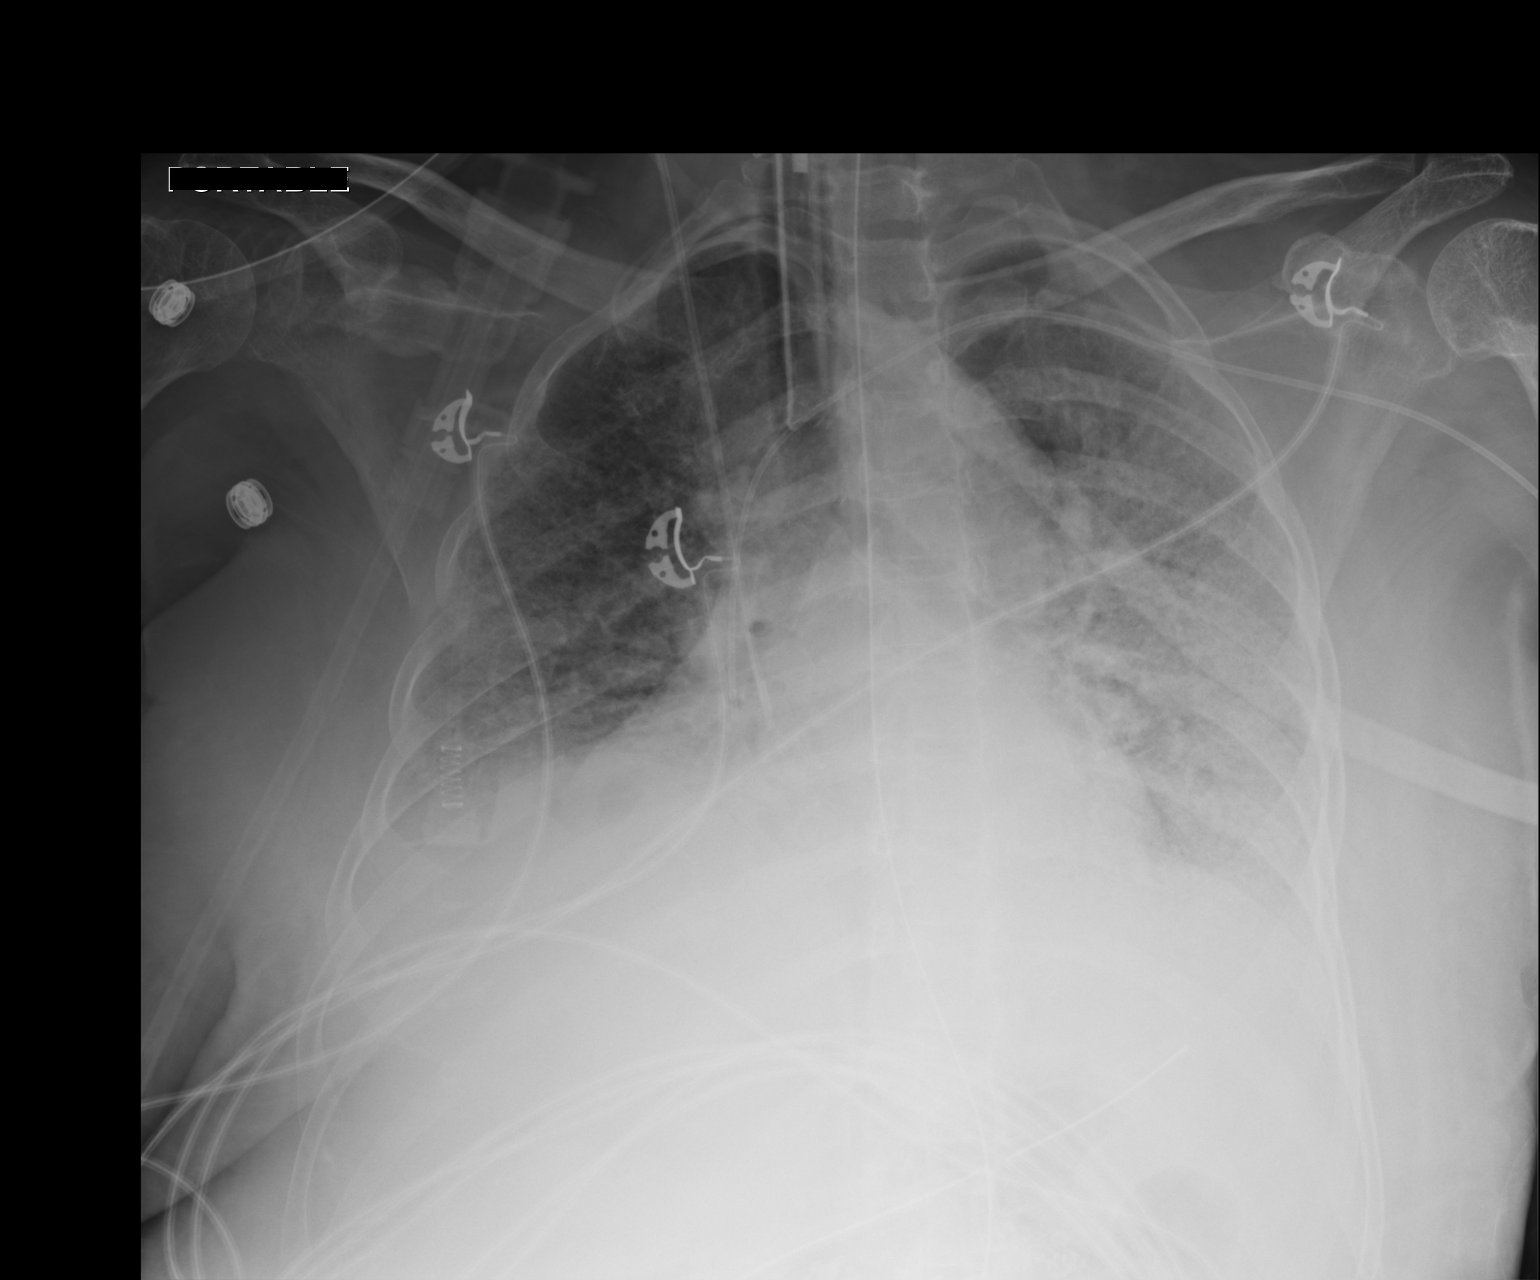

[1 of 1 positions shown; findings below may reference images not displayed]

FINDINGS: Support lines and tubes in appropriate position. No evidence of
pneumothorax.

Asymmetric airspace disease is seen involving the left lung greater
than right, without significant change allowing for differences in
radiographic technique and patient positioning.

Mild increase in right basilar atelectasis seen. Small right pleural
effusion cannot be excluded. Heart size remains stable.
IMPRESSION: No significant change in asymmetric airspace disease involving left
lung greater than right.

Mild increase in right basilar atelectasis.
